# Patient Record
Sex: Female | Born: 1960 | Race: Black or African American | Hispanic: No | Marital: Single | Smoking: Never smoker
Health system: Southern US, Community
[De-identification: ages and names within clinical notes are randomized; demographics above are authoritative.]

## PROBLEM LIST (undated history)

## (undated) DIAGNOSIS — F329 Major depressive disorder, single episode, unspecified: Secondary | ICD-10-CM

## (undated) DIAGNOSIS — E876 Hypokalemia: Secondary | ICD-10-CM

## (undated) DIAGNOSIS — G43909 Migraine, unspecified, not intractable, without status migrainosus: Secondary | ICD-10-CM

## (undated) DIAGNOSIS — F411 Generalized anxiety disorder: Secondary | ICD-10-CM

## (undated) DIAGNOSIS — I10 Essential (primary) hypertension: Secondary | ICD-10-CM

## (undated) DIAGNOSIS — M5126 Other intervertebral disc displacement, lumbar region: Secondary | ICD-10-CM

## (undated) DIAGNOSIS — Z923 Personal history of irradiation: Secondary | ICD-10-CM

## (undated) DIAGNOSIS — M87052 Idiopathic aseptic necrosis of left femur: Secondary | ICD-10-CM

## (undated) DIAGNOSIS — G2581 Restless legs syndrome: Secondary | ICD-10-CM

## (undated) DIAGNOSIS — M654 Radial styloid tenosynovitis [de Quervain]: Secondary | ICD-10-CM

## (undated) DIAGNOSIS — E119 Type 2 diabetes mellitus without complications: Secondary | ICD-10-CM

## (undated) DIAGNOSIS — R7303 Prediabetes: Secondary | ICD-10-CM

## (undated) DIAGNOSIS — M51369 Other intervertebral disc degeneration, lumbar region without mention of lumbar back pain or lower extremity pain: Secondary | ICD-10-CM

## (undated) DIAGNOSIS — M87051 Idiopathic aseptic necrosis of right femur: Secondary | ICD-10-CM

## (undated) DIAGNOSIS — M542 Cervicalgia: Secondary | ICD-10-CM

## (undated) DIAGNOSIS — G4733 Obstructive sleep apnea (adult) (pediatric): Secondary | ICD-10-CM

## (undated) DIAGNOSIS — M1990 Unspecified osteoarthritis, unspecified site: Secondary | ICD-10-CM

## (undated) DIAGNOSIS — Z860100 Personal history of colon polyps, unspecified: Secondary | ICD-10-CM

## (undated) DIAGNOSIS — F419 Anxiety disorder, unspecified: Secondary | ICD-10-CM

## (undated) DIAGNOSIS — K219 Gastro-esophageal reflux disease without esophagitis: Secondary | ICD-10-CM

## (undated) DIAGNOSIS — G4700 Insomnia, unspecified: Secondary | ICD-10-CM

## (undated) DIAGNOSIS — C50911 Malignant neoplasm of unspecified site of right female breast: Secondary | ICD-10-CM

## (undated) DIAGNOSIS — D0511 Intraductal carcinoma in situ of right breast: Secondary | ICD-10-CM

## (undated) DIAGNOSIS — M5136 Other intervertebral disc degeneration, lumbar region: Secondary | ICD-10-CM

## (undated) HISTORY — PX: BREAST BIOPSY: SHX20

## (undated) HISTORY — DX: Personal history of irradiation: Z92.3

## (undated) HISTORY — DX: Idiopathic aseptic necrosis of right femur: M87.052

## (undated) HISTORY — DX: Hypokalemia: E87.6

## (undated) HISTORY — DX: Generalized anxiety disorder: F41.1

## (undated) HISTORY — DX: Insomnia, unspecified: G47.00

## (undated) HISTORY — DX: Essential (primary) hypertension: I10

## (undated) HISTORY — PX: DILATION AND CURETTAGE OF UTERUS: SHX78

## (undated) HISTORY — DX: Major depressive disorder, single episode, unspecified: F32.9

## (undated) HISTORY — DX: Cervicalgia: M54.2

## (undated) HISTORY — DX: Radial styloid tenosynovitis (de quervain): M65.4

## (undated) HISTORY — DX: Personal history of colon polyps, unspecified: Z86.0100

## (undated) HISTORY — DX: Type 2 diabetes mellitus without complications: E11.9

## (undated) HISTORY — PX: JOINT REPLACEMENT: SHX530

## (undated) HISTORY — DX: Obstructive sleep apnea (adult) (pediatric): G47.33

## (undated) HISTORY — DX: Idiopathic aseptic necrosis of right femur: M87.051

## (undated) HISTORY — PX: ABDOMINAL HYSTERECTOMY: SHX81

## (undated) HISTORY — DX: Intraductal carcinoma in situ of right breast: D05.11

## (undated) HISTORY — DX: Personal history of colonic polyps: Z86.010

## (undated) HISTORY — DX: Personal history of colon polyps: Z86.010

## (undated) HISTORY — PX: BACK SURGERY: SHX140

---
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
## 1995-08-09 HISTORY — PX: BREAST LUMPECTOMY: SHX2

## 1998-02-23 ENCOUNTER — Ambulatory Visit (HOSPITAL_COMMUNITY): Admission: RE | Admit: 1998-02-23 | Discharge: 1998-02-23 | Payer: Self-pay | Admitting: Surgery

## 1999-07-30 ENCOUNTER — Ambulatory Visit (HOSPITAL_COMMUNITY): Admission: RE | Admit: 1999-07-30 | Discharge: 1999-07-30 | Payer: Self-pay | Admitting: Internal Medicine

## 1999-07-30 ENCOUNTER — Encounter: Payer: Self-pay | Admitting: Internal Medicine

## 1999-08-31 ENCOUNTER — Encounter: Payer: Self-pay | Admitting: Internal Medicine

## 1999-08-31 ENCOUNTER — Encounter: Admission: RE | Admit: 1999-08-31 | Discharge: 1999-08-31 | Payer: Self-pay | Admitting: Internal Medicine

## 2000-08-31 ENCOUNTER — Encounter: Payer: Self-pay | Admitting: Oncology

## 2000-08-31 ENCOUNTER — Ambulatory Visit (HOSPITAL_COMMUNITY): Admission: RE | Admit: 2000-08-31 | Discharge: 2000-08-31 | Payer: Self-pay | Admitting: Oncology

## 2001-02-14 ENCOUNTER — Other Ambulatory Visit: Admission: RE | Admit: 2001-02-14 | Discharge: 2001-02-14 | Payer: Self-pay | Admitting: Obstetrics and Gynecology

## 2001-11-01 ENCOUNTER — Encounter: Admission: RE | Admit: 2001-11-01 | Discharge: 2001-11-01 | Payer: Self-pay | Admitting: Oncology

## 2001-11-01 ENCOUNTER — Encounter: Payer: Self-pay | Admitting: Oncology

## 2002-12-04 ENCOUNTER — Encounter: Payer: Self-pay | Admitting: Oncology

## 2002-12-04 ENCOUNTER — Encounter: Admission: RE | Admit: 2002-12-04 | Discharge: 2002-12-04 | Payer: Self-pay | Admitting: Oncology

## 2003-01-27 ENCOUNTER — Encounter: Payer: Self-pay | Admitting: Family Medicine

## 2003-01-27 ENCOUNTER — Encounter: Admission: RE | Admit: 2003-01-27 | Discharge: 2003-01-27 | Payer: Self-pay | Admitting: Family Medicine

## 2003-07-28 ENCOUNTER — Other Ambulatory Visit: Admission: RE | Admit: 2003-07-28 | Discharge: 2003-07-28 | Payer: Self-pay | Admitting: *Deleted

## 2004-02-26 ENCOUNTER — Encounter: Admission: RE | Admit: 2004-02-26 | Discharge: 2004-02-26 | Payer: Self-pay | Admitting: Oncology

## 2005-01-12 ENCOUNTER — Other Ambulatory Visit: Admission: RE | Admit: 2005-01-12 | Discharge: 2005-01-12 | Payer: Self-pay | Admitting: Obstetrics and Gynecology

## 2005-02-16 ENCOUNTER — Ambulatory Visit: Payer: Self-pay | Admitting: Internal Medicine

## 2005-03-03 ENCOUNTER — Ambulatory Visit: Payer: Self-pay | Admitting: Oncology

## 2005-03-29 ENCOUNTER — Ambulatory Visit: Payer: Self-pay | Admitting: Internal Medicine

## 2005-04-21 ENCOUNTER — Ambulatory Visit: Payer: Self-pay | Admitting: Oncology

## 2005-05-16 ENCOUNTER — Encounter: Admission: RE | Admit: 2005-05-16 | Discharge: 2005-05-16 | Payer: Self-pay | Admitting: Oncology

## 2005-08-31 ENCOUNTER — Ambulatory Visit: Payer: Self-pay | Admitting: Internal Medicine

## 2005-09-27 ENCOUNTER — Ambulatory Visit: Payer: Self-pay | Admitting: Oncology

## 2005-12-27 ENCOUNTER — Emergency Department (HOSPITAL_COMMUNITY): Admission: EM | Admit: 2005-12-27 | Discharge: 2005-12-27 | Payer: Self-pay | Admitting: *Deleted

## 2006-04-19 ENCOUNTER — Ambulatory Visit: Payer: Self-pay | Admitting: Oncology

## 2006-10-16 ENCOUNTER — Ambulatory Visit: Payer: Self-pay | Admitting: Internal Medicine

## 2006-10-16 LAB — CONVERTED CEMR LAB
BUN: 7 mg/dL (ref 6–23)
Basophils Absolute: 0 10*3/uL (ref 0.0–0.1)
Lymphocytes Relative: 20.5 % (ref 12.0–46.0)
Neutro Abs: 5.3 10*3/uL (ref 1.4–7.7)
Neutrophils Relative %: 69.7 % (ref 43.0–77.0)
Platelets: 213 10*3/uL (ref 150–400)
RDW: 13.4 % (ref 11.5–14.6)
WBC: 7.5 10*3/uL (ref 4.5–10.5)

## 2006-11-06 ENCOUNTER — Encounter: Admission: RE | Admit: 2006-11-06 | Discharge: 2006-11-06 | Payer: Self-pay | Admitting: Obstetrics and Gynecology

## 2006-11-13 ENCOUNTER — Inpatient Hospital Stay (HOSPITAL_COMMUNITY): Admission: RE | Admit: 2006-11-13 | Discharge: 2006-11-15 | Payer: Self-pay | Admitting: Obstetrics and Gynecology

## 2006-11-13 ENCOUNTER — Encounter (INDEPENDENT_AMBULATORY_CARE_PROVIDER_SITE_OTHER): Payer: Self-pay | Admitting: *Deleted

## 2007-06-29 ENCOUNTER — Encounter: Payer: Self-pay | Admitting: Internal Medicine

## 2007-07-20 ENCOUNTER — Encounter: Payer: Self-pay | Admitting: Internal Medicine

## 2007-09-25 ENCOUNTER — Ambulatory Visit: Payer: Self-pay | Admitting: Internal Medicine

## 2007-09-25 DIAGNOSIS — I10 Essential (primary) hypertension: Secondary | ICD-10-CM | POA: Insufficient documentation

## 2007-09-26 ENCOUNTER — Encounter: Payer: Self-pay | Admitting: Internal Medicine

## 2008-01-03 ENCOUNTER — Encounter: Admission: RE | Admit: 2008-01-03 | Discharge: 2008-01-03 | Payer: Self-pay | Admitting: Obstetrics and Gynecology

## 2008-02-14 ENCOUNTER — Emergency Department (HOSPITAL_COMMUNITY): Admission: EM | Admit: 2008-02-14 | Discharge: 2008-02-14 | Payer: Self-pay | Admitting: Internal Medicine

## 2008-02-14 ENCOUNTER — Telehealth (INDEPENDENT_AMBULATORY_CARE_PROVIDER_SITE_OTHER): Payer: Self-pay | Admitting: *Deleted

## 2008-02-15 ENCOUNTER — Ambulatory Visit: Payer: Self-pay | Admitting: Internal Medicine

## 2008-02-15 DIAGNOSIS — D259 Leiomyoma of uterus, unspecified: Secondary | ICD-10-CM | POA: Insufficient documentation

## 2008-02-15 DIAGNOSIS — Z9889 Other specified postprocedural states: Secondary | ICD-10-CM | POA: Insufficient documentation

## 2008-02-15 DIAGNOSIS — G43909 Migraine, unspecified, not intractable, without status migrainosus: Secondary | ICD-10-CM | POA: Insufficient documentation

## 2008-05-01 ENCOUNTER — Ambulatory Visit: Payer: Self-pay | Admitting: Internal Medicine

## 2008-05-14 ENCOUNTER — Telehealth (INDEPENDENT_AMBULATORY_CARE_PROVIDER_SITE_OTHER): Payer: Self-pay | Admitting: *Deleted

## 2008-07-04 ENCOUNTER — Telehealth (INDEPENDENT_AMBULATORY_CARE_PROVIDER_SITE_OTHER): Payer: Self-pay | Admitting: *Deleted

## 2008-07-10 ENCOUNTER — Ambulatory Visit: Payer: Self-pay | Admitting: Internal Medicine

## 2008-07-10 DIAGNOSIS — R519 Headache, unspecified: Secondary | ICD-10-CM | POA: Insufficient documentation

## 2008-07-10 DIAGNOSIS — F329 Major depressive disorder, single episode, unspecified: Secondary | ICD-10-CM | POA: Insufficient documentation

## 2008-07-10 DIAGNOSIS — Z860100 Personal history of colon polyps, unspecified: Secondary | ICD-10-CM | POA: Insufficient documentation

## 2008-07-10 DIAGNOSIS — Z853 Personal history of malignant neoplasm of breast: Secondary | ICD-10-CM | POA: Insufficient documentation

## 2008-07-10 DIAGNOSIS — R51 Headache: Secondary | ICD-10-CM | POA: Insufficient documentation

## 2008-07-14 ENCOUNTER — Encounter (INDEPENDENT_AMBULATORY_CARE_PROVIDER_SITE_OTHER): Payer: Self-pay | Admitting: *Deleted

## 2008-08-08 HISTORY — PX: COLONOSCOPY W/ POLYPECTOMY: SHX1380

## 2009-01-15 ENCOUNTER — Encounter: Admission: RE | Admit: 2009-01-15 | Discharge: 2009-01-15 | Payer: Self-pay | Admitting: Obstetrics and Gynecology

## 2009-02-18 ENCOUNTER — Encounter: Admission: RE | Admit: 2009-02-18 | Discharge: 2009-02-18 | Payer: Self-pay | Admitting: Obstetrics and Gynecology

## 2009-05-01 ENCOUNTER — Encounter: Admission: RE | Admit: 2009-05-01 | Discharge: 2009-05-01 | Payer: Self-pay | Admitting: Obstetrics and Gynecology

## 2009-05-08 ENCOUNTER — Encounter: Admission: RE | Admit: 2009-05-08 | Discharge: 2009-05-08 | Payer: Self-pay | Admitting: Obstetrics and Gynecology

## 2009-07-25 ENCOUNTER — Telehealth: Payer: Self-pay | Admitting: Internal Medicine

## 2009-07-27 ENCOUNTER — Encounter (INDEPENDENT_AMBULATORY_CARE_PROVIDER_SITE_OTHER): Payer: Self-pay | Admitting: *Deleted

## 2009-07-27 ENCOUNTER — Telehealth (INDEPENDENT_AMBULATORY_CARE_PROVIDER_SITE_OTHER): Payer: Self-pay | Admitting: *Deleted

## 2009-09-02 ENCOUNTER — Ambulatory Visit: Payer: Self-pay | Admitting: Internal Medicine

## 2009-09-02 DIAGNOSIS — R198 Other specified symptoms and signs involving the digestive system and abdomen: Secondary | ICD-10-CM | POA: Insufficient documentation

## 2009-09-02 DIAGNOSIS — F411 Generalized anxiety disorder: Secondary | ICD-10-CM | POA: Insufficient documentation

## 2009-10-23 ENCOUNTER — Ambulatory Visit: Payer: Self-pay | Admitting: Internal Medicine

## 2009-10-23 DIAGNOSIS — K219 Gastro-esophageal reflux disease without esophagitis: Secondary | ICD-10-CM | POA: Insufficient documentation

## 2010-04-06 ENCOUNTER — Ambulatory Visit: Payer: Self-pay | Admitting: Internal Medicine

## 2010-04-06 ENCOUNTER — Encounter (INDEPENDENT_AMBULATORY_CARE_PROVIDER_SITE_OTHER): Payer: Self-pay | Admitting: *Deleted

## 2010-07-16 ENCOUNTER — Ambulatory Visit: Payer: Self-pay | Admitting: Internal Medicine

## 2010-07-16 DIAGNOSIS — J069 Acute upper respiratory infection, unspecified: Secondary | ICD-10-CM | POA: Insufficient documentation

## 2010-07-16 HISTORY — DX: Acute upper respiratory infection, unspecified: J06.9

## 2010-07-19 ENCOUNTER — Telehealth: Payer: Self-pay | Admitting: Internal Medicine

## 2010-07-20 ENCOUNTER — Encounter: Payer: Self-pay | Admitting: Internal Medicine

## 2010-09-07 NOTE — Letter (Signed)
Summary: Out of Work  [facility]  [address] [place], [place] [address]   Phone: [phone]  Fax: [phone]    April 06, 2010   Employee:  ANTWAN BRIBIESCA Palazzi    To Whom It May Concern:   For Medical reasons, please excuse the above named employee from work for the following dates:  Start:   April 06, 2010   End:   April 07, 2010  If you need additional information, please feel free to contact our office.         Sincerely,    Army Fossa CMA

## 2010-09-07 NOTE — Assessment & Plan Note (Signed)
Summary: DIZZY/HEADACHE/CBS   Vital Signs:  Patient profile:   50 year old female Weight:      197.50 pounds Pulse rate:   88 / minute Pulse rhythm:   regular BP sitting:   152 / 98  (left arm) Cuff size:   large  Vitals Entered By: Army Fossa CMA (April 06, 2010 2:37 PM) CC: Pt here c/o HA, feels that her BP is elevated.   History of Present Illness: developed a headache yesterday  headache is located at the mid forehead , on and off. He is a slightly different to her regular headaches which are usually in the temples She felt slightly dizzy yesterday and had nausea. Today the pain is less intense, no further nausea.  ROS denies fevers No sinus congestion, runny nose or sore throat This is not the worst headache of her life No slurred speech no neck stiffness patient wonders if the headache is related to her blood pressure but she has not been taking ambulatory  BPs  routinely.  Current Medications (verified): 1)  Benazepril Hcl 40 Mg  Tabs (Benazepril Hcl) .Marland Kitchen.. 1 By Mouth Once Daily, 2)  Hydrochlorothiazide 25 Mg  Tabs (Hydrochlorothiazide) .... 1/2 Qd 3)  Carvedilol 6.25 Mg Tabs (Carvedilol) .Marland Kitchen.. 1 Two Times A Day  Allergies (verified): No Known Drug Allergies  Past History:  Past Medical History: Reviewed history from 07/10/2008 and no changes required. Hypertension Breast cancer, hx of Headache Colonic polyps, hx of, Dr [address]  Past Surgical History: Reviewed history from 09/02/2009 and no changes required. DILATION AND CURETTAGE, HX OF (ICD-V45.89) Lumpectomy, Radiation & Chemotherapy 1997 Colon polypectomy 2010  Social History: Occupation:[employer] single tobacco--nmo ETOH-- no  Physical Exam  General:  alert and well-developed.   Head:  face symmetric, sinuses nontender to palpation Nose:  not congested Lungs:  normal respiratory effort, no intercostal retractions, and no accessory muscle use.   Heart:  normal rate, regular rhythm,  and no murmur.   Extremities:  no edema Neurologic:  external ocular movements are intact, pupils equal and reactive Speech is fluent Motor exam normal Neck is full range of motion DTRs symmetric Psych:  not anxious appearing and not depressed appearing.     Impression & Recommendations:  Problem # 1:  HEADACHE (ICD-784.0) headache for one day, slightly different  from the previous headaches but not the worst of her life. No red flag symptoms, neuro  exam normal Plan: Rest, fluids, Tylenol. Will call if no better, will need a CT Her updated medication list for this problem includes:    Carvedilol 12.5 Mg Tabs (Carvedilol) ..... One by mouth twice a day  Problem # 2:  HYPERTENSION (ICD-401.9) Well control? increase carvedilol does from 6.25 twice a day to 12.5  twice a day increase hydrochlorothiazide Her updated medication list for this problem includes:    Benazepril Hcl 40 Mg Tabs (Benazepril hcl) .Marland Kitchen... 1 by mouth once daily,    Hydrochlorothiazide 25 Mg Tabs (Hydrochlorothiazide) .Marland Kitchen... 1 by mouth daily    Carvedilol 12.5 Mg Tabs (Carvedilol) ..... One by mouth twice a day  BP today: 152/98 Prior BP: 158/96 (10/23/2009)  Prior 10 Yr Risk Heart Disease: Not enough information (09/25/2007)  Labs Reviewed: K+: 3.8 (10/16/2006) Creat: : 0.8 (10/16/2006)     Complete Medication List: 1)  Benazepril Hcl 40 Mg Tabs (Benazepril hcl) .Marland Kitchen.. 1 by mouth once daily, 2)  Hydrochlorothiazide 25 Mg Tabs (Hydrochlorothiazide) .Marland Kitchen.. 1 by mouth daily 3)  Carvedilol 12.5 Mg Tabs (  Carvedilol) .... One by mouth twice a day  Patient Instructions: 1)  rest, fluids, Tylenol 500 mg one or 2 every 6 hours as needed for headache. 2)  Call if the headache is severe or no better in a couple of days 3)  Increase the medication for BP, see medication list 4)  Come back in one month for a check up with Dr. Alwyn Ren Prescriptions: BENAZEPRIL HCL 40 MG  TABS (BENAZEPRIL HCL) 1 by mouth once daily,  #30 x  1   Entered and Authorized by:   Elita Quick E. Paz MD   Signed by:   Nolon Rod. Paz MD on 04/06/2010   Method used:   Print then Give to Patient   RxID:   [id number] CARVEDILOL 12.5 MG TABS (CARVEDILOL) one by mouth twice a day  #60 x 3   Entered and Authorized by:   Nolon Rod. Paz MD   Signed by:   Nolon Rod. Paz MD on 04/06/2010   Method used:   Print then Give to Patient   RxID:   [id number] HYDROCHLOROTHIAZIDE 25 MG  TABS (HYDROCHLOROTHIAZIDE) 1 by mouth daily  #30 x 1   Entered and Authorized by:   Nolon Rod. Paz MD   Signed by:   Nolon Rod. Paz MD on 04/06/2010   Method used:   Print then Give to Patient   RxID:   [id number]

## 2010-09-07 NOTE — Assessment & Plan Note (Signed)
Summary: migrane/kdc   Vital Signs:  Patient profile:   50 year old female Weight:      215.4 pounds Temp:     98.2 degrees F oral Pulse rate:   76 / minute Resp:     16 per minute BP sitting:   158 / 96  (left arm) Cuff size:   large  Vitals Entered By: Shonna Chock (October 23, 2009 11:01 AM) CC: Hypertension Management Comments REVIEWED MED LIST, PATIENT AGREED DOSE AND INSTRUCTION CORRECT    CC:  Hypertension Management.  History of Present Illness: Headaches L temple with radiation to R frontal area; sharp, lasting days.Rx: Excedrin ES with partial benefit.Trigger : ? overtime @ work. PMH of migraines. See BP; BP @ home 160/?. additionally some "choking on saliva" in RLDP X 2 over past 2 months.She awakens "choking".  Hypertension History:      She complains of headache, but denies chest pain, palpitations, dyspnea with exertion, orthopnea, PND, peripheral edema, visual symptoms, neurologic problems, syncope, and side effects from treatment.  She notes no problems with any antihypertensive medication side effects.        Positive major cardiovascular risk factors include hypertension.  Negative major cardiovascular risk factors include female age less than 107 years old and non-tobacco-user status.     Allergies (verified): No Known Drug Allergies  Review of Systems General:  Complains of sweats; denies chills and fever; Weight down 7# with W W. Eyes:  Denies blurring, double vision, and vision loss-both eyes. ENT:  Complains of hoarseness; denies difficulty swallowing. GI:  Complains of gas and indigestion; denies abdominal pain, bloody stools, dark tarry stools, nausea, and vomiting; No food or pill dysphagia. Derm:  Denies lesion(s) and rash. Neuro:  Denies brief paralysis, disturbances in coordination, falling down, numbness, tingling, visual disturbances, and weakness; Some mild gait imbalance.  Physical Exam  General:  well-nourished; alert,appropriate and cooperative  throughout examination Eyes:  No corneal or conjunctival inflammation noted. EOMI. Perrla. Field of Vision grossly normal. No icterus Mouth:  Oral mucosa and oropharynx without lesions or exudates.  Teeth in good repair. No pharyngeal erythema.   Lungs:  Normal respiratory effort, chest expands symmetrically. Lungs are clear to auscultation, no crackles or wheezes. Heart:  normal rate, regular rhythm, no gallop, no rub, no JVD, no HJR, and grade 1 /6 systolic murmur.   Abdomen:  Bowel sounds positive,abdomen soft and non-tender without masses, organomegaly or hernias noted. Neurologic:  alert & oriented X3, cranial nerves II-XII intact, strength normal in all extremities, sensation intact to light touch, gait normal, DTRs symmetrical and normal, finger-to-nose normal, and Romberg negative.   Skin:  Intact without suspicious lesions or rashes Cervical Nodes:  No lymphadenopathy noted Axillary Nodes:  No palpable lymphadenopathy Psych:  memory intact for recent and remote, normally interactive, and good eye contact.     Impression & Recommendations:  Problem # 1:  HEADACHE (ICD-784.0)  The following medications were removed from the medication list:    Metoprolol Tartrate 50 Mg Tabs (Metoprolol tartrate) .Marland Kitchen... 1 tab bid Her updated medication list for this problem includes:    Carvedilol 6.25 Mg Tabs (Carvedilol) .Marland Kitchen... 1 two times a day  Problem # 2:  HYPERTENSION (ICD-401.9)  The following medications were removed from the medication list:    Metoprolol Tartrate 50 Mg Tabs (Metoprolol tartrate) .Marland Kitchen... 1 tab bid Her updated medication list for this problem includes:    Benazepril Hcl 40 Mg Tabs (Benazepril hcl) .Marland Kitchen... 1 by mouth once  daily,    Hydrochlorothiazide 25 Mg Tabs (Hydrochlorothiazide) .Marland Kitchen... 1/2 qd    Carvedilol 6.25 Mg Tabs (Carvedilol) .Marland Kitchen... 1 two times a day  Problem # 3:  GERD (ICD-530.81)  Her updated medication list for this problem includes:     Clidinium-chlordiazepoxide 2.5-5 Mg Caps (Clidinium-chlordiazepoxide) .Marland Kitchen... 1 every 6-8 hrs as needed for bowel changes    Ranitidine Hcl 150 Mg Tabs (Ranitidine hcl) .Marland Kitchen... 1 two times a day pre meals as needed  Complete Medication List: 1)  Benazepril Hcl 40 Mg Tabs (Benazepril hcl) .Marland Kitchen.. 1 by mouth once daily, 2)  Hydrochlorothiazide 25 Mg Tabs (Hydrochlorothiazide) .... 1/2 qd 3)  Neurontin 100 Mg Caps (Gabapentin) .Marland Kitchen.. 1-2 q 8 hrs as needed headache 4)  Vitamin D 23762 Unit Caps (Ergocalciferol) .Marland Kitchen.. 1 by mouth weekly 5)  Citalopram Hydrobromide 20 Mg Tabs (Citalopram hydrobromide) .Marland Kitchen.. 1 once daily 6)  Clidinium-chlordiazepoxide 2.5-5 Mg Caps (Clidinium-chlordiazepoxide) .Marland Kitchen.. 1 every 6-8 hrs as needed for bowel changes 7)  Carvedilol 6.25 Mg Tabs (Carvedilol) .Marland Kitchen.. 1 two times a day 8)  Ranitidine Hcl 150 Mg Tabs (Ranitidine hcl) .Marland Kitchen.. 1 two times a day pre meals as needed  Hypertension Assessment/Plan:      The patient's hypertensive risk group is category A: No risk factors and no target organ damage.  Today's blood pressure is 158/96.    Patient Instructions: 1)  Check your Blood Pressure regularly. If it is above: 140/90 ON AVERAGE  you should  call. 2)  Avoid foods high in acid (tomatoes, citrus juices, spicy foods). Avoid eating within two hours of lying down or before exercising. Do not over eat; try smaller more frequent meals. Elevate head of bed twelve inches when sleeping. Consume LESS THAN 25 grams of "sugar" / day from foods & drinks with High Fructose Corn Syrup as #1, 2 or # 3 on label. 3)  Take 650-1000mg  of Tylenol every 4-6 hours as needed for relief of pain . AVOID taking more than 4000mg   in a 24 hour period (can cause liver damage in higher doses). Prescriptions: RANITIDINE HCL 150 MG TABS (RANITIDINE HCL) 1 two times a day pre meals as needed  #60 x 5   Entered and Authorized by:   Marga Melnick MD   Signed by:   Marga Melnick MD on 10/23/2009   Method used:   Faxed  to ...       [facility]  [address] [phone]* (retail)       [address]       [place], [place]  [address]       Ph: [phone]       Fax: [phone]   RxID:   [id number] NEURONTIN 100 MG  CAPS (GABAPENTIN) 1-2 q 8 hrs as needed headache  #30 x 5   Entered and Authorized by:   Marga Melnick MD   Signed by:   Marga Melnick MD on 10/23/2009   Method used:   Faxed to ...       [facility]  [address] [phone]* (retail)       [address]       [place], [place]  [address]       Ph: [phone]       Fax: [phone]   RxID:   [id number] CARVEDILOL 6.25 MG TABS (CARVEDILOL) 1 two times a day  #60 x 5   Entered and Authorized by:   Marga Melnick MD   Signed by:   Marga Melnick MD on  10/23/2009   Method used:   Faxed to ...       [facility]  [address] [phone]* (retail)       [address]       [place], [place]  [address]       Ph: [phone]       Fax: [phone]   RxID:   [id number]

## 2010-09-07 NOTE — Assessment & Plan Note (Signed)
Summary: FU ON BP/KDC   Vital Signs:  Patient profile:   50 year old female Weight:      220.6 pounds Temp:     98.6 degrees F oral Pulse rate:   80 / minute Resp:     16 per minute BP sitting:   140 / 82  (left arm) Cuff size:   large  Vitals Entered By: Shonna Chock (September 02, 2009 3:01 PM) CC: 1.) Follow-up visit: b/p  2.) Diarrhea x 2-3 weeks off/on , Hypertension Management Comments REVIEWED MED LIST, PATIENT AGREED DOSE AND INSTRUCTION CORRECT    CC:  1.) Follow-up visit: b/p  2.) Diarrhea x 2-3 weeks off/on  and Hypertension Management.  History of Present Illness:  Diarrhea      This is a 50 year old woman who presents with bowel changes intermittently > 3 weeks.  The patient reports 3 stools or less per day only during work week, semiformed/loose stools, voluminous stools,  malodorous stools, fecal urgency, bloating, and gassiness, but denies  rectal bleeding,mucus in stool, greasy stools, fecal soiling, alternating diarrhea/constipation, nocturnal diarrhea, fasting diarrhea, and abrupt onset of symptoms.  Associated symptoms include abdominal cramps.  The patient denies fever, abdominal pain, nausea, vomiting, lightheadedness, increased thirst, weight loss, joint pains, mouth ulcers, and eye redness.  The symptoms are worse with specific foods.  The symptoms are better with avoidance of specific foods, those foods prepared in work [employer]. Problem @ home  with dairy & with spices. Her sister takes anti dirrheal tablets before eating.Colonoscopy 2010 : 2 polyps, repeat in 2015.    Hypertension History:      She denies headache, chest pain, palpitations, dyspnea with exertion, orthopnea, PND, peripheral edema, visual symptoms, neurologic problems, syncope, and side effects from treatment.  She notes no problems with any antihypertensive medication side effects.  BP up due to job stress; ? anxiety attacks.        Positive major cardiovascular risk factors include hypertension.   Negative major cardiovascular risk factors include female age less than 22 years old and non-tobacco-user status.     Allergies (verified): No Known Drug Allergies  Past History:  Past Surgical History: DILATION AND CURETTAGE, HX OF (ICD-V45.89) Lumpectomy, Radiation & Chemotherapy 1997 Colon polypectomy 2010  Review of Systems Eyes:  Denies blurring, double vision, and vision loss-both eyes. CV:  Edema only when off BP meds. Neuro:  Denies numbness and tingling. Psych:  Complains of anxiety, depression, easily angered, easily tearful, and irritability; Not SSRI.  Physical Exam  General:  well-nourished,in no acute distress; alert,appropriate and cooperative throughout examination Eyes:  No corneal or conjunctival inflammation noted.No icterus Mouth:  Oral mucosa and oropharynx without lesions or exudates.  No pharyngeal erythema.  Mucosa moist Neck:  No deformities, masses, or tenderness noted. Lungs:  Normal respiratory effort, chest expands symmetrically. Lungs are clear to auscultation, no crackles or wheezes. Heart:  Normal rate and regular rhythm. S1 and S2 normal without gallop, murmur, click, rub . S4 Abdomen:  Bowel sounds positive,abdomen soft and non-tender without masses, organomegaly or hernias noted. Skin:  Intact without suspicious lesions or rashes. No tenting Cervical Nodes:  No lymphadenopathy noted Axillary Nodes:  No palpable lymphadenopathy Psych:  memory intact for recent and remote, normally interactive, and good eye contact.     Impression & Recommendations:  Problem # 1:  CHANGE IN BOWELS (FTD-322.02) Unknown dietary triggers  Problem # 2:  HYPERTENSION (ICD-401.9)  Her updated medication list for this problem includes:  Benazepril Hcl 40 Mg Tabs (Benazepril hcl) .Marland Kitchen... 1 by mouth once daily,    Metoprolol Tartrate 50 Mg Tabs (Metoprolol tartrate) .Marland Kitchen... 1 tab bid    Hydrochlorothiazide 25 Mg Tabs (Hydrochlorothiazide) .Marland Kitchen... 1/2 qd  Problem # 3:   ANXIETY STATE, UNSPECIFIED (ICD-300.00)  Her updated medication list for this problem includes:    Citalopram Hydrobromide 20 Mg Tabs (Citalopram hydrobromide) .Marland Kitchen... 1 once daily  Complete Medication List: 1)  Benazepril Hcl 40 Mg Tabs (Benazepril hcl) .Marland Kitchen.. 1 by mouth once daily, 2)  Metoprolol Tartrate 50 Mg Tabs (Metoprolol tartrate) .Marland Kitchen.. 1 tab bid 3)  Hydrochlorothiazide 25 Mg Tabs (Hydrochlorothiazide) .... 1/2 qd 4)  Neurontin 100 Mg Caps (Gabapentin) .Marland Kitchen.. 1-2 q 8 hrs as needed headache 5)  Vitamin D 16109 Unit Caps (Ergocalciferol) .Marland Kitchen.. 1 by mouth weekly 6)  Citalopram Hydrobromide 20 Mg Tabs (Citalopram hydrobromide) .Marland Kitchen.. 1 once daily 7)  Clidinium-chlordiazepoxide 2.5-5 Mg Caps (Clidinium-chlordiazepoxide) .Marland Kitchen.. 1 every 6-8 hrs as needed for bowel changes  Hypertension Assessment/Plan:      The patient's hypertensive risk group is category A: No risk factors and no target organ damage.  Today's blood pressure is 140/82.    Patient Instructions: 1)  Check your Blood Pressure regularly. If it is above: 135/85 ON AVERAGE you should make an appointment. Keep a food diary if symptoms occur. LactAid if milk induced Prescriptions: CITALOPRAM HYDROBROMIDE 20 MG TABS (CITALOPRAM HYDROBROMIDE) 1 once daily  #90 x 1   Entered and Authorized by:   Marga Melnick MD   Signed by:   Marga Melnick MD on 09/02/2009   Method used:   Faxed to ...       [facility]  [address] [phone]* (retail)       [address]       [place], [place]  [address]       Ph: [phone]       Fax: [phone]   RxID:   [id number] HYDROCHLOROTHIAZIDE 25 MG  TABS (HYDROCHLOROTHIAZIDE) 1/2 qd  #90 x 0   Entered and Authorized by:   Marga Melnick MD   Signed by:   Marga Melnick MD on 09/02/2009   Method used:   Faxed to ...       [facility]  [address] [phone]* (retail)       [address]       [place], [place]  [address]       Ph: [phone]       Fax: [phone]   RxID:   [id number] METOPROLOL TARTRATE 50 MG   TABS (METOPROLOL TARTRATE) 1 tab bid  #180 x 1   Entered and Authorized by:   Marga Melnick MD   Signed by:   Marga Melnick MD on 09/02/2009   Method used:   Faxed to ...       [facility]  [address] [phone]* (retail)       [address]       [place], [place]  [address]       Ph: [phone]       Fax: [phone]   RxID:   [id number] BENAZEPRIL HCL 40 MG  TABS (BENAZEPRIL HCL) 1 by mouth once daily,  #90 x 1   Entered and Authorized by:   Marga Melnick MD   Signed by:   Marga Melnick MD on 09/02/2009   Method used:   Faxed to ...       [facility]  [address] [phone]* (retail)  [address]       [place], [place]  [address]       Ph: [phone]       Fax: [phone]   RxID:   [id number] CLIDINIUM-CHLORDIAZEPOXIDE 2.5-5 MG CAPS (CLIDINIUM-CHLORDIAZEPOXIDE) 1 every 6-8 hrs as needed for bowel changes  #30 x 3   Entered and Authorized by:   Marga Melnick MD   Signed by:   Marga Melnick MD on 09/02/2009   Method used:   Faxed to ...       [facility]  [address] [phone]* (retail)       [address]       [place], [place]  [address]       Ph: [phone]       Fax: [phone]   RxID:   [id number]

## 2010-09-07 NOTE — Assessment & Plan Note (Signed)
Summary: cold//lch   Vital Signs:  Patient profile:   50 year old female Height:      65.5 inches Weight:      205.13 pounds (93.24 kg) BMI:     33.74 O2 Sat:      97 % on Room air Temp:     98.5 degrees F (36.94 degrees C) oral Pulse rate:   89 / minute Resp:     16 per minute BP sitting:   190 / 110  (left arm) Cuff size:   large  Vitals Entered By: Lucious Groves CMA (July 16, 2010 11:58 AM)  O2 Flow:  Room air CC: Possible URI/sinus infection x2 days./kb, URI symptoms Is Patient Diabetic? No Pain Assessment Patient in pain? no      Comments Patient notes that she has been having sore throat, cough with clear mucous production, head congestion, and HA. She denies fever, SOB, and chest pain.   CC:  Possible URI/sinus infection x2 days./kb and URI symptoms.  History of Present Illness:      This is a 50 year old woman who presents with  RTI  symptoms since 12/7, onset as head congestion.  The patient reports nasal congestion, clear nasal discharge, sore throat, and productive cough with clear sputum, but denies purulent nasal discharge and earache.  Associated  are slight wheezing ( no PMH of asthma) ,frontal headache & bilateral facial pain.  The patient denies the following risk factors for Strep sinusitis: tooth pain and tender adenopathy.  Rx: Coricidin HP.        See BP.  The patient reports fatigue, but denies lightheadedness, urinary frequency, and edema.  The patient denies the following associated symptoms: chest pain, chest pressure, palpitations, and syncope.  Compliance with medications (by patient report) has been near 100%.  Adjunctive measures currently used by the patient include salt restriction.  BP  not checked @ home.  Current Medications (verified): 1)  Benazepril Hcl 40 Mg  Tabs (Benazepril Hcl) .Marland Kitchen.. 1 By Mouth Once Daily, 2)  Hydrochlorothiazide 25 Mg  Tabs (Hydrochlorothiazide) .Marland Kitchen.. 1 By Mouth Daily 3)  Carvedilol 12.5 Mg Tabs (Carvedilol) .... One By  Mouth Twice A Day  Allergies (verified): No Known Drug Allergies  Physical Exam  General:  well-nourished,in no acute distress; alert,appropriate and cooperative throughout examination Ears:  External ear exam shows no significant lesions or deformities.  Otoscopic examination reveals clear canals, tympanic membranes are intact bilaterally without bulging, retraction, inflammation or discharge. Hearing is grossly normal bilaterally. Nose:  External nasal examination shows no deformity or inflammation. Nasal mucosa are  boggy ; L nare almost closed  without lesions or exudates. Hyponasal speech Mouth:  Oral mucosa and oropharynx without lesions or exudates.  Teeth in good repair. Lungs:  Normal respiratory effort, chest expands symmetrically. Lungs are clear to auscultation, no crackles or wheezes. Barking cough Heart:  normal rate, regular rhythm, no gallop, no rub, no JVD, no HJR, and grade 1 /6 systolic murmur.   Abdomen:  Bowel sounds positive,abdomen soft and non-tender without masses, organomegaly or hernias noted.No AAA or bruits Extremities:  No clubbing, cyanosis, edema. Cervical Nodes:  No lymphadenopathy noted Axillary Nodes:  No palpable lymphadenopathy   Impression & Recommendations:  Problem # 1:  BRONCHITIS-ACUTE (ICD-466.0)  Her updated medication list for this problem includes:    Azithromycin 250 Mg Tabs (Azithromycin) .Marland Kitchen... As per pack    Hydromet 5-1.5 Mg/26ml Syrp (Hydrocodone-homatropine) .Marland Kitchen... 1 tsp every 6 hrs as needed for  cough  Problem # 2:  URI (ICD-465.9)  Her updated medication list for this problem includes:    Hydromet 5-1.5 Mg/57ml Syrp (Hydrocodone-homatropine) .Marland Kitchen... 1 tsp every 6 hrs as needed for cough  Problem # 3:  HYPERTENSION, ESSENTIAL NOS (ICD-401.9) UNCONTROLLED; risk (12X normal ) discussed Her updated medication list for this problem includes:    Benazepril Hcl 40 Mg Tabs (Benazepril hcl) .Marland Kitchen... 1 by mouth once daily,    Hydrochlorothiazide  25 Mg Tabs (Hydrochlorothiazide) .Marland Kitchen... 1 by mouth daily  Complete Medication List: 1)  Benazepril Hcl 40 Mg Tabs (Benazepril hcl) .Marland Kitchen.. 1 by mouth once daily, 2)  Hydrochlorothiazide 25 Mg Tabs (Hydrochlorothiazide) .Marland Kitchen.. 1 by mouth daily 3)  Carvedilol 25 Mg  .... One by mouth twice a day 4)  Azithromycin 250 Mg Tabs (Azithromycin) .... As per pack 5)  Hydromet 5-1.5 Mg/40ml Syrp (Hydrocodone-homatropine) .Marland Kitchen.. 1 tsp every 6 hrs as needed for cough  Patient Instructions: 1)  Neti pot once daily  unti sinuses are clear.Avoid decongestants. 2)  Drink as much  NON dairy fluid as you can tolerate for the next few days.Increase Carvedilol 12.5 mg two times a day to TWO pills two times a day until present supply completed. 3)  Check your Blood Pressure regularly. If it is above: 135/85 ON AVERAGE  you should make an appointment. Prescriptions: HYDROMET 5-1.5 MG/5ML SYRP (HYDROCODONE-HOMATROPINE) 1 tsp every 6 hrs as needed for cough  #120cc x 0   Entered and Authorized by:   Marga Melnick MD   Signed by:   Marga Melnick MD on 07/16/2010   Method used:   Print then Give to Patient   RxID:   [id number] AZITHROMYCIN 250 MG TABS (AZITHROMYCIN) as per pack  #1 x 0   Entered and Authorized by:   Marga Melnick MD   Signed by:   Marga Melnick MD on 07/16/2010   Method used:   Print then Give to Patient   RxID:   [id number] CARVEDILOL  25 MG one by mouth twice a day  #180 x 1   Entered and Authorized by:   Marga Melnick MD   Signed by:   Marga Melnick MD on 07/16/2010   Method used:   Print then Give to Patient   RxID:   [id number]    Orders Added: 1)  Est. Patient Level IV [01027]

## 2010-09-09 NOTE — Progress Notes (Signed)
Summary: COUGH MEDICATION  Phone Note Call from Patient Call back at Home Phone [phone]   Caller: Patient Summary of Call: Pt was seen by Dr. Alwyn Ren on Friday and was given a RX for a cough medicine. Pt still is coughing alot and wants to know if there is something else that she can have called in for her that doesn't have codiene in it so that she can take it during the day. She doesnt know if she should continue to take the cough medicine she has or do something else. Initial call taken by: Lavell Islam,  July 19, 2010 8:48 AM  Follow-up for Phone Call        pls advise.........Marland KitchenFelecia Deloach CMA  July 19, 2010 9:05 AM  Per dr hopper TESSALON 200 MG CAPS 1 every 6 hours as needed #15...Marland KitchenMarland KitchenFelecia Deloach CMA  July 19, 2010 12:32 PM  Pt aware rx sent to pharmacy..........Marland KitchenFelecia Deloach CMA  July 19, 2010 12:32 PM     New/Updated Medications: TESSALON 200 MG CAPS (BENZONATATE) Take 1 tab every 6 hours as needed Prescriptions: TESSALON 200 MG CAPS (BENZONATATE) Take 1 tab every 6 hours as needed  #15 x 0   Entered by:   Jeremy Johann CMA   Authorized by:   Marga Melnick MD   Signed by:   Jeremy Johann CMA on 07/19/2010   Method used:   Faxed to ...       [facility]  [address] [phone]* (retail)       [address]       [place], [place]  [address]       Ph: [phone]       Fax: [phone]   RxID:   [id number]

## 2010-09-09 NOTE — Letter (Signed)
Summary: [facility]  [facility]   Imported By: Lanelle Bal 08/13/2010 10:50:17  _____________________________________________________________________  External Attachment:    Type:   Image     Comment:   External Document

## 2010-12-24 NOTE — Assessment & Plan Note (Signed)
Madeline Chandler                        [facility] OFFICE NOTE   Madeline Chandler                      MRN:          [id number]  DATE:10/16/2006                            DOB:          Dec 26, 1960    HISTORY OF PRESENT ILLNESS:  Madeline Chandler was seen emergently on October 16, 2006, for abdominal pain and dysmenorrhea.   The menses began on October 10, 2006 and were two weeks early.  She had  heavy flow and large clots.  Although the flow lessened, she continued  to have intermittent clots and cramping pain.  The pain was gas-like in  the lower abdomen and progressive over the last 24-36 hours.  Tylenol/Alleve did not provide relief.  Flatus did provide some  improvement as did the supine position.  She has had associated nausea  and has been unable to take her blood pressure medications for several  days.  She also has constipation followed by some loose stools.  With  the present illness, she has had some anorexia.   PAST MEDICAL HISTORY:  1. Fibroids found on ultrasound.  2. Breast cancer in 1998.  3  Two pregnancies and two D&C's.   FAMILY HISTORY:  No personal family history of uterine or ovarian  disease other than fibroid disease.  Father had stroke and is on  dialysis.  Mother has hypertension as did maternal grandmother and  brother.   SOCIAL HISTORY:  She does not smoke or drink.   ALLERGIES:  She has no known drug allergies.   MEDICATIONS:  1. She had been on Benazepril 40 mg one half daily.  2. Metoprolol 50 mg b.i.d.   PHYSICAL EXAMINATION:  VITAL SIGNS:  Weight is up approximately 8 pounds  to 201.  Pulse 72, respirations 17, blood pressure 202/110.  There was  no postural change in pressure or pulse.  Blood pressure, supine to  standing, varied from 182-190/115/118.  Fundal exam revealed no  papilledema.  She does have arterial narrowing of the fundal arteries.  CHEST:  Clear.  No increased work of breathing.  HEART:   She exhibits  S4.  She has no carotid bruits.  No periumbilical  bruits.  Pedal pulses are decreased.  She has no edema.  She has no  organomegaly.  She is mildly tender in the suprapubic area.   ASSESSMENT:  The risk associated with blood pressure at this level was  discussed with her; it is essentially 13 times normal.  She will be  asked to resume the Benazepril, a whole pill per day and Metoprolol one  pill twice a day.  BUN, creatinine and potassium will be checked.  On  history, she has no other bleeding dyscrasias.  CBC and differential  will be checked.  She will be placed on Ultram ER one daily.  Gynecologic consult with Dr. Vincente Poli will be obtained as quickly as  possible.   Unfortunately, Madeline Chandler did not see Gynecology last year because she was  between jobs and had no insurance coverage.     Madeline Chandler. Alwyn Ren, MD,FACP,FCCP  Electronically Signed    [facility]/MedQ  DD: 10/16/2006  DT: 10/16/2006  Job #: [id number]

## 2010-12-24 NOTE — Op Note (Signed)
NAME:  Madeline Chandler, CAPPELLI               ACCOUNT NO.:  [id number]   MEDICAL RECORD NO.:  [id number]          PATIENT TYPE:  INP   LOCATION:  9306                          FACILITY:  [facility]   PHYSICIAN:  Michelle L. Grewal, M.D.DATE OF BIRTH:  [birth year]   DATE OF PROCEDURE:  11/13/2006  DATE OF DISCHARGE:                               OPERATIVE REPORT   PREOPERATIVE DIAGNOSES:  1. Fibroids.  2. Menorrhagia.  3. Pelvic pain.   POSTOPERATIVE DIAGNOSES:  1. Fibroids.  2. Menorrhagia.  3. Pelvic pain.   PROCEDURES:  Total abdominal hysterectomy and bilateral salpingo-  oophorectomy.   SURGEON:  Michelle L. Vincente Poli, M.D.   ASSISTANT:  Zelphia Cairo, MD.   ANESTHESIA:  General.   SPECIMENS:  Uterus, cervix, tubes and ovaries.   ESTIMATED BLOOD LOSS:  200 mL.   DRAIN:  Foley.   COMPLICATIONS:  None.   PROCEDURE:  Patient taken to the operating room after informed consent  is obtained.  She was counseled and acknowledged excepted risk of  surgery which includes risk of anesthesia, risk of injury to internal  organs such as ureter, bowel and bladder, risk of infection, risk of  pulmonary embolism and risk of DVT.  After the patient was intubated,  she was prepped and draped in the standard fashion.  A Foley catheter  was inserted and was draining clear urine.  A low transverse incision  was made, carried down to the fascia.  Fascia was scored in the midline  and extended laterally.  The rectus muscles were separated in the  midline.  The peritoneum was entered bluntly.  The peritoneal incision  was then stretched.  The uterus was noted to be myomatous and about 16-  week size.  The ovaries appeared normal.  We then placed some laparotomy  pads in the upper abdomen to push the bowel in the upper abdomen.  We  then elevated the uterus and identified the round ligament on the right  side, suture ligated the round ligament and made a small incision in the  broad ligament on either  side anteriorly and posteriorly.  I created an  avascular window just beneath the IP ligament with careful attention to  avoid the ureter below on the right and placed a curved Heaney clamp  across the IP ligament.  This pedicle was suture ligated and tied with a  free tie of 0 Vicryl suture.  This was done in similar fashion on the  left side.  We then developed our bladder flap without difficulty and  skeletonized the uterine artery on either side and then placed curved  Heaney clamps just beside the cervical internal os to clamp the uterine  artery.  Each pedicle was suture ligated using 0 Vicryl suture.  We then  walked our way down beside the cervix with careful attention to avoid  this and careful attention to stay just beside the cervix.  Each pedicle  was suture ligated using 0 Vicryl suture.  Once we reached the level of  the external os and the bladder was well out of our way, we placed  curved Heaney clamps just beneath the external cervical os of the  cervix.  The specimen was removed, the angle stitches were made using 0  Vicryl suture and the remainder of the cuff was closed using four figure-  of-eight using 0 Vicryl suture.  Warm irrigation was performed.  All  pedicles were inspected.  Hemostasis was very good.  The laparotomy pads  and all instruments removed and abdominal cavity.  Peritoneum was closed  using 0 Vicryl in a running stitch.  The rectus muscles were  reapproximated using the same 0 Vicryl.  The fascia was closed using 0  Vicryl in a running stitch starting at each corner meeting in the  midline.  After irrigation of subcutaneous layer, the skin was closed  with staples.  All sponge, lap and instrument counts were correct x2.  The patient went to recovery room in stable condition.      Michelle L. Vincente Poli, M.D.  Electronically Signed     MLG/MEDQ  D:  11/13/2006  T:  11/13/2006  Job:  [id number]

## 2010-12-24 NOTE — Discharge Summary (Signed)
NAME:  Madeline Chandler, Madeline Chandler               ACCOUNT NO.:  [id number]   MEDICAL RECORD NO.:  [id number]          PATIENT TYPE:  INP   LOCATION:  9306                          FACILITY:  [facility]   PHYSICIAN:  Michelle L. Grewal, M.D.DATE OF BIRTH:  [birth year]   DATE OF ADMISSION:  11/13/2006  DATE OF DISCHARGE:  11/15/2006                               DISCHARGE SUMMARY   PRE-OPERATIVE DIAGNOSES:  Pelvic pain, symptomatic fibroids and history  of breast cancer and dysmenorrhea.   POSTOPERATIVE DIAGNOSES:  Pelvic pain, symptomatic fibroids and history  of breast cancer and dysmenorrhea.   PROCEDURE:  TAH and BSO.   HOSPITAL COURSE:  The patient is a 50 year old G2, P0 who presents for  TAH-BSO.  She has multiple fibroids.  She has had problems with  menorrhagia and severe dysmenorrhea.  Of note, she has a history of  breast cancer in 1998.  The patient underwent a TAH-BSO.  Surgery was  uneventful.  EBO was 200 mL.  She did very well post-op.  On post-op day  #1, her hemoglobin was 9.9.  By post-op day #2, she was ambulating,  voiding, tolerating regular diet and had good pain control with her  ibuprofen and Tylox.  Her vitals remained stable, and she remained  afebrile throughout hospitalization.  She was discharged home on post-op  day #2 in good condition.  She was advised no driving for 2 weeks, no  sex for 6 weeks, and she is advised no heavy lifting for 4-6 weeks.  She  was advised to call if she has any fever greater than 100.5 degrees,  nausea, vomiting, redness or drainage from incision site.  She will come  back to the office on Monday for an incision check and for me to remove  her staples.      Michelle L. Vincente Poli, M.D.  Electronically Signed     MLG/MEDQ  D:  12/26/2006  T:  12/26/2006  Job:  [id number]

## 2011-01-27 ENCOUNTER — Other Ambulatory Visit: Payer: Self-pay | Admitting: Internal Medicine

## 2011-03-31 ENCOUNTER — Telehealth: Payer: Self-pay | Admitting: Internal Medicine

## 2011-03-31 NOTE — Telephone Encounter (Signed)
Left msg on voicemail for pt to return call . 

## 2011-04-05 NOTE — Telephone Encounter (Signed)
Left msg for pt to return call.

## 2011-04-06 ENCOUNTER — Ambulatory Visit: Payer: Self-pay | Admitting: Internal Medicine

## 2011-04-07 ENCOUNTER — Encounter: Payer: Self-pay | Admitting: Internal Medicine

## 2011-04-07 ENCOUNTER — Ambulatory Visit (INDEPENDENT_AMBULATORY_CARE_PROVIDER_SITE_OTHER): Payer: 59 | Admitting: Internal Medicine

## 2011-04-07 VITALS — BP 176/94 | HR 92 | Temp 98.4°F | Resp 16 | Wt 215.4 lb

## 2011-04-07 DIAGNOSIS — R300 Dysuria: Secondary | ICD-10-CM

## 2011-04-07 DIAGNOSIS — F430 Acute stress reaction: Secondary | ICD-10-CM

## 2011-04-07 DIAGNOSIS — R350 Frequency of micturition: Secondary | ICD-10-CM

## 2011-04-07 DIAGNOSIS — I10 Essential (primary) hypertension: Secondary | ICD-10-CM

## 2011-04-07 DIAGNOSIS — Z833 Family history of diabetes mellitus: Secondary | ICD-10-CM

## 2011-04-07 LAB — POCT URINALYSIS DIPSTICK
Bilirubin, UA: NEGATIVE
Blood, UA: NEGATIVE
Ketones, UA: NEGATIVE
Nitrite, UA: NEGATIVE
pH, UA: NEGATIVE

## 2011-04-07 LAB — BASIC METABOLIC PANEL
BUN: 19 mg/dL (ref 6–23)
CO2: 27 mEq/L (ref 19–32)
Creatinine, Ser: 1.1 mg/dL (ref 0.4–1.2)
Glucose, Bld: 96 mg/dL (ref 70–99)

## 2011-04-07 LAB — HEMOGLOBIN A1C: Hgb A1c MFr Bld: 6.3 % (ref 4.6–6.5)

## 2011-04-07 MED ORDER — CARVEDILOL 25 MG PO TABS: 25.0000 mg | ORAL_TABLET | Freq: Two times a day (BID) | ORAL | Status: DC

## 2011-04-07 MED ORDER — BENAZEPRIL HCL 40 MG PO TABS: 40.0000 mg | ORAL_TABLET | Freq: Every day | ORAL | Status: DC

## 2011-04-07 MED ORDER — HYDROCHLOROTHIAZIDE 25 MG PO TABS: 25.0000 mg | ORAL_TABLET | ORAL | Status: DC

## 2011-04-07 MED ORDER — CITALOPRAM HYDROBROMIDE 20 MG PO TABS: 20.0000 mg | ORAL_TABLET | Freq: Every day | ORAL | Status: DC

## 2011-04-07 NOTE — Telephone Encounter (Signed)
Spoke w/ pt was seen in office today.

## 2011-04-07 NOTE — Progress Notes (Signed)
Addended byMarga Melnick F on: 04/07/2011 12:24 PM   Modules accepted: Orders

## 2011-04-07 NOTE — Progress Notes (Signed)
Addended by: Doristine Devoid on: 04/07/2011 12:28 PM   Modules accepted: Orders

## 2011-04-07 NOTE — Progress Notes (Signed)
  Subjective:    Patient ID: Madeline Chandler, female    DOB: 28-Sep-1960, 50 y.o.   MRN: [id number]  HPI #1 Frequency: Onset: few months    Worsening: yes Symptoms Urgency: yes,   Hesitancy: no  Hematuria: no  Flank Pain: no  Fever: no    Nausea/Vomiting: no  Nocturia : 2-3X/ night Red Flags  : (Risk Factors for Complicated UTI) Recent Antibiotic Usage (last 30 days): no  Symptoms lasting more than seven (7) days: yes,   More than 3 UTI's last 12 months: no PMH of  1. DM: no 2. Renal Disease/Calculi: no 3. Urinary Tract Abnormality: no  4. Instrumentation/Trauma: no 5. 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  Eyes: No conjunctival inflammation or scleral icterus is present. Arteriolar narrowing; tigroid fundus  Neck:  No deformities, thyromegaly, masses, or tenderness noted.   Heart:  Normal rate and regular rhythm. S1 and S2 normal without gallop, murmur, click, rub or other extra sounds     Lungs:Chest clear to auscultation; no wheezes, rhonchi,rales ,or rubs present.No increased work of breathing.   Abdomen: bowel sounds normal, soft and non-tender without masses, organomegaly or hernias noted.  No guarding or rebound . No AAA or bruits  All pulses intact without  bruits .No ischemic skin changes.    Skin:Warm & dry.  Intact without suspicious lesions or rashes ; no jaundice or tenting  Lymphatic: No lymphadenopathy is noted about the head, neck, axilla               Assessment & Plan:  #1 frequency of urination  #2 hypertension, uncontrolled. Risks discussed  #3 exogenous stresses  Plan see orders and recommendations.

## 2011-04-07 NOTE — Patient Instructions (Signed)
Your BP goal = AVERAGE < 135/85. Avoid ingestion of  excess salt/sodium.Cook with pepper & other spices . Use the salt substitute "No Salt"(unless your potassium has been elevated) OR the Mrs Dash products to season food @ the table. Avoid foods which taste salty or "vinegary" as their sodium contentet will be high.  

## 2011-05-05 LAB — POCT I-STAT, CHEM 8
BUN: 13
Calcium, Ion: 1.23
Chloride: 106
Hemoglobin: 14.3
Sodium: 141
TCO2: 25

## 2011-08-19 ENCOUNTER — Other Ambulatory Visit: Payer: Self-pay | Admitting: Internal Medicine

## 2011-08-22 ENCOUNTER — Other Ambulatory Visit: Payer: Self-pay

## 2011-08-22 ENCOUNTER — Encounter (HOSPITAL_COMMUNITY): Payer: Self-pay | Admitting: Emergency Medicine

## 2011-08-22 ENCOUNTER — Emergency Department (HOSPITAL_COMMUNITY): Payer: 59

## 2011-08-22 ENCOUNTER — Emergency Department (HOSPITAL_COMMUNITY)
Admission: EM | Admit: 2011-08-22 | Discharge: 2011-08-22 | Disposition: A | Discharge status: Home / Self Care | Payer: 59 | Attending: Emergency Medicine | Admitting: Emergency Medicine

## 2011-08-22 DIAGNOSIS — R21 Rash and other nonspecific skin eruption: Secondary | ICD-10-CM | POA: Insufficient documentation

## 2011-08-22 DIAGNOSIS — R059 Cough, unspecified: Secondary | ICD-10-CM | POA: Insufficient documentation

## 2011-08-22 DIAGNOSIS — Z79899 Other long term (current) drug therapy: Secondary | ICD-10-CM | POA: Insufficient documentation

## 2011-08-22 DIAGNOSIS — R05 Cough: Secondary | ICD-10-CM | POA: Insufficient documentation

## 2011-08-22 DIAGNOSIS — I10 Essential (primary) hypertension: Secondary | ICD-10-CM

## 2011-08-22 DIAGNOSIS — R002 Palpitations: Secondary | ICD-10-CM | POA: Insufficient documentation

## 2011-08-22 DIAGNOSIS — I493 Ventricular premature depolarization: Secondary | ICD-10-CM

## 2011-08-22 DIAGNOSIS — Z853 Personal history of malignant neoplasm of breast: Secondary | ICD-10-CM | POA: Insufficient documentation

## 2011-08-22 DIAGNOSIS — I4949 Other premature depolarization: Secondary | ICD-10-CM | POA: Insufficient documentation

## 2011-08-22 LAB — DIFFERENTIAL
Basophils Absolute: 0 10*3/uL (ref 0.0–0.1)
Basophils Relative: 0 % (ref 0–1)
Eosinophils Absolute: 0.2 10*3/uL (ref 0.0–0.7)
Lymphocytes Relative: 53 % — ABNORMAL HIGH (ref 12–46)

## 2011-08-22 LAB — BASIC METABOLIC PANEL
BUN: 15 mg/dL (ref 6–23)
Potassium: 3.9 mEq/L (ref 3.5–5.1)
Sodium: 137 mEq/L (ref 135–145)

## 2011-08-22 LAB — CBC
Hemoglobin: 12.9 g/dL (ref 12.0–15.0)
WBC: 6 10*3/uL (ref 4.0–10.5)

## 2011-08-22 LAB — POCT I-STAT TROPONIN I: Troponin i, poc: 0 ng/mL (ref 0.00–0.08)

## 2011-08-22 MED ORDER — IOHEXOL 350 MG/ML SOLN
80.0000 mL | Freq: Once | INTRAVENOUS | Status: AC | PRN
  Administered 2011-08-22: 80 mL via INTRAVENOUS

## 2011-08-22 MED ORDER — GI COCKTAIL ~~LOC~~
30.0000 mL | Freq: Once | ORAL | Status: AC
  Administered 2011-08-22: 30 mL via ORAL
  Filled 2011-08-22: qty 30

## 2011-08-22 NOTE — ED Provider Notes (Signed)
Medical screening examination/treatment/procedure(s) were performed by non-physician practitioner and as supervising physician I was immediately available for consultation/collaboration.   Glynn Octave, MD 08/22/11 [phone]

## 2011-08-22 NOTE — ED Notes (Signed)
Vitals retaken

## 2011-08-22 NOTE — ED Notes (Signed)
EDP aware of pts Blood pressures. Will continue to monitor.

## 2011-08-22 NOTE — ED Provider Notes (Signed)
History     CSN: [id number]  Arrival date & time 08/22/11  1635   First MD Initiated Contact with Patient 08/22/11 2042      Chief Complaint  Patient presents with  . Palpitations     Patient is a 51 y.o. female presenting with palpitations. The history is provided by the patient.  Palpitations  This is a new problem. The current episode started 12 to 24 hours ago. The problem has been gradually improving. The problem is associated with an unknown factor. Associated symptoms include irregular heartbeat. Pertinent negatives include no diaphoresis, no chest pain, no chest pressure, no near-syncope, no nausea, no vomiting, no back pain, no lower extremity edema, no cough, no hemoptysis and no shortness of breath.   patient reports feeling of palpitations on and off all day today. States she feels her heart beating irregularly. Denies actual chest pain, shortness of breath, pleuritic pain or fever. States that she's had a mild nonproductive cough without other symptoms. States that the symptoms are temporarily relieved but belching but return. Denies epigastric pain or burning. States she has felt irregular beats in the past but was concerned because these have been happening on and off all day today.  Past Medical History  Diagnosis Date  . Hypertension   . History of breast cancer   . History of colonic polyps     Past Surgical History  Procedure Date  . Dilation and curettage of uterus   . Breast lumpectomy 1997  . Colonoscopy w/ polypectomy 2010    No family history on file.  History  Substance Use Topics  . Smoking status: Never Smoker   . Smokeless tobacco: Not on file  . Alcohol Use: No    OB History    Grav Para Term Preterm Abortions TAB SAB Ect Mult Living                  Review of Systems  Constitutional: Negative.  Negative for diaphoresis.  HENT: Negative.   Eyes: Negative.   Respiratory: Negative.  Negative for cough, hemoptysis and shortness of breath.    Cardiovascular: Positive for palpitations. Negative for chest pain and near-syncope.  Gastrointestinal: Negative.  Negative for nausea and vomiting.  Genitourinary: Negative.   Musculoskeletal: Negative.  Negative for back pain.  Skin: Negative.   Neurological: Negative.   Hematological: Negative.   Psychiatric/Behavioral: Negative.     Allergies  Review of patient's allergies indicates no known allergies.  Home Medications   Current Outpatient Rx  Name Route Sig Dispense Refill  . BENAZEPRIL HCL 40 MG PO TABS Oral Take 40 mg by mouth daily.    Marland Kitchen CARVEDILOL 25 MG PO TABS Oral Take 25 mg by mouth 2 (two) times daily with a meal.    . CITALOPRAM HYDROBROMIDE 20 MG PO TABS Oral Take 20 mg by mouth daily.    Marland Kitchen HYDROCHLOROTHIAZIDE 25 MG PO TABS Oral Take 12.5 mg by mouth daily.       BP 198/130  Pulse 68  Temp(Src) 97.9 F (36.6 C) (Oral)  Resp 14  SpO2 100%  Physical Exam  Constitutional: She is oriented to person, place, and time. She appears well-developed and well-nourished.  HENT:  Head: Normocephalic and atraumatic.  Eyes: Conjunctivae are normal.  Neck: Neck supple.  Cardiovascular: Normal rate and regular rhythm.        PVC's noted on initial EKG, but otherwise heart rate is a regular rate and rhythm without ectopy at this time. Patient  is hypertensive at 208/97 but has known HTN  Pulmonary/Chest: Effort normal and breath sounds normal.  Abdominal: Soft. Bowel sounds are normal.  Musculoskeletal: Normal range of motion.  Neurological: She is alert and oriented to person, place, and time.  Skin: Skin is warm and dry. Rash noted. Rash is papular. No erythema.  Psychiatric: She has a normal mood and affect.    ED Course  Procedures   Date: 08/22/2011  Rate: 78  Rhythm: SR w/ PVC's  QRS Axis: normal  Intervals: normal  ST/T Wave abnormalities: nonspecific ST changes and nonspecific T wave changes  Conduction Disutrbances:none  Narrative Interpretation: No acute  changes   Old EKG Reviewed: unchanged  Patient reports complete relief of symptoms after GI cocktail. Blood pressure has improved. Chest x-ray results show a paratracheal density, CT chest with contrast is recommended to rule out mass. Discussed with patient who is agreeable with plan.  11:17 PM CT chest with contrast shows  IMPRESSION: Torturuous brachiocephalic vasculature accounts for the right paratracheal density by chest x-ray. Negative for adenopathy or acute intrathoracic process.  Postop changes right breast and axilla.  Original Report Authenticated By: Judie Petit. Ruel Favors, M.D. I have discussed these findings with the patient who is reassured.  BP now 163/81,patient reports feels much better Will plan for discharge home and encourage her to follow up with primary primary care provider as soon as can be arranged.    Labs Reviewed  DIFFERENTIAL - Abnormal; Notable for the following:    Neutrophils Relative 33 (*)    Lymphocytes Relative 53 (*)    All other components within normal limits  BASIC METABOLIC PANEL - Abnormal; Notable for the following:    Glucose, Bld 102 (*)    GFR calc non Af Amer 68 (*)    GFR calc Af Amer 79 (*)    All other components within normal limits  CBC  I-STAT TROPONIN I   No results found.   No diagnosis found.    MDM  HPI/PE and clinical findings/course c/w PVC's as evidenced on EKG to explain c/o palpitations Chest CT reveals Torturuous brachiocephalic vasculature accounting for the right paratracheal density by chest x-ray HTN (much improved).      Leanne Chang, NP 08/22/11 2322

## 2011-08-22 NOTE — ED Notes (Signed)
PT. REPORTS PALPITATIONS ONSET THIS MORNING , DENIES CHEST PAIN OR SOB ,  NO NAUSEA OR VOMITING .

## 2011-08-22 NOTE — ED Notes (Addendum)
Pt stated that she is having "flutter feeling" in chest since this AM. No SOB. Pt stated that she normally has this feeling and it goes away. This time the feeling did not go away. Pt has a hx of HTN. Pt stated that restarted HTN meds  Friday. No nausea. No pain. No respiratory distress at this time. S1 ad S2 heard. Will continue to monitor.

## 2011-08-29 ENCOUNTER — Ambulatory Visit: Payer: 59 | Admitting: Internal Medicine

## 2011-10-26 ENCOUNTER — Ambulatory Visit (INDEPENDENT_AMBULATORY_CARE_PROVIDER_SITE_OTHER): Payer: 59 | Admitting: Internal Medicine

## 2011-10-26 VITALS — BP 170/110 | HR 92 | Temp 98.9°F | Ht 66.25 in | Wt 216.0 lb

## 2011-10-26 DIAGNOSIS — J019 Acute sinusitis, unspecified: Secondary | ICD-10-CM

## 2011-10-26 DIAGNOSIS — J209 Acute bronchitis, unspecified: Secondary | ICD-10-CM

## 2011-10-26 MED ORDER — AMOXICILLIN 500 MG PO CAPS: 500.0000 mg | ORAL_CAPSULE | Freq: Three times a day (TID) | ORAL | Status: AC

## 2011-10-26 MED ORDER — HYDROCODONE-HOMATROPINE 5-1.5 MG/5ML PO SYRP: 5.0000 mL | ORAL_SOLUTION | Freq: Four times a day (QID) | ORAL | Status: AC | PRN

## 2011-10-26 NOTE — Patient Instructions (Signed)
Plain Mucinex for thick secretions ;force NON dairy fluids. Use a Neti pot daily as needed for sinus congestion. Nasal cleansing in the shower as discussed. Make sure that all residual soap is removed to prevent irritation.  Please remain out of work until  10/28/2011

## 2011-10-26 NOTE — Progress Notes (Signed)
  Subjective:    Patient ID: Madeline Chandler, female    DOB: 02/12/1961, 51 y.o.   MRN: [id number]  HPI Respiratory tract infection Onset/symptoms:10/21/11 as dry cough Exposures (illness/environmental/extrinsic):ill co workers Progression of symptoms:to sputum production Treatments/response:OTC thinning agent & cough drops w/o benefit Present symptoms: Fever/chills/sweats:chills Frontal headache:yes Facial pain:no Nasal purulence:yes Sore throat:yes Dental pain:no Lymphadenopathy:no Wheezing/shortness of breath:some wheezing Cough/sputum/hemoptysis:rattly but dry Pleuritic pain:no Associated extrinsic/allergic symptoms:itchy eyes/ sneezing:no Past medical history: Seasonal allergies:yes/asthma:no Smoking history:no           Review of Systems     Objective:   Physical Exam General appearance:good health ;well nourished; no acute distress or increased work of breathing is present.  No  lymphadenopathy about the head, neck, or axilla noted.   Eyes: No conjunctival inflammation or lid edema is present.  Ears:  External ear exam shows no significant lesions or deformities.  Otoscopic examination reveals clear canals, tympanic membranes are intact bilaterally without bulging, retraction, inflammation or discharge.  Nose:  External nasal examination shows no deformity or inflammation. Nasal mucosa are pink and moist without lesions or exudates. No septal dislocation or deviation.No obstruction to airflow.   Oral exam: Dental caries present; lips and gums are healthy appearing.There is minimal oropharyngeal erythema or exudate noted.     Heart:  Normal rate and regular rhythm. S1  normal . S2 accentuated without gallop, murmur, click, rub. S 4.   Lungs:Chest clear to auscultation; no wheezes, rhonchi,rales ,or rubs present.No increased work of breathing but harsh rattling cough  Extremities:  No cyanosis, edema, or clubbing  noted    Skin: Warm & slightly damp           Assessment & Plan:  #1 acute bronchitis w/o bronchospam #2 sinusitis Plan: See orders and recommendations

## 2012-01-21 ENCOUNTER — Other Ambulatory Visit: Payer: Self-pay | Admitting: Internal Medicine

## 2012-02-23 ENCOUNTER — Ambulatory Visit (INDEPENDENT_AMBULATORY_CARE_PROVIDER_SITE_OTHER): Payer: 59 | Admitting: Internal Medicine

## 2012-02-23 ENCOUNTER — Encounter: Payer: Self-pay | Admitting: Internal Medicine

## 2012-02-23 VITALS — BP 148/88 | HR 75 | Wt 216.2 lb

## 2012-02-23 DIAGNOSIS — F411 Generalized anxiety disorder: Secondary | ICD-10-CM

## 2012-02-23 DIAGNOSIS — F419 Anxiety disorder, unspecified: Secondary | ICD-10-CM

## 2012-02-23 DIAGNOSIS — R9431 Abnormal electrocardiogram [ECG] [EKG]: Secondary | ICD-10-CM

## 2012-02-23 DIAGNOSIS — I10 Essential (primary) hypertension: Secondary | ICD-10-CM

## 2012-02-23 DIAGNOSIS — R7309 Other abnormal glucose: Secondary | ICD-10-CM

## 2012-02-23 MED ORDER — CARVEDILOL 25 MG PO TABS: 25.0000 mg | ORAL_TABLET | Freq: Two times a day (BID) | ORAL | Status: DC

## 2012-02-23 MED ORDER — BENAZEPRIL HCL 40 MG PO TABS: 40.0000 mg | ORAL_TABLET | Freq: Every day | ORAL | Status: DC

## 2012-02-23 MED ORDER — CITALOPRAM HYDROBROMIDE 20 MG PO TABS: 20.0000 mg | ORAL_TABLET | Freq: Every day | ORAL | Status: DC

## 2012-02-23 MED ORDER — HYDROCHLOROTHIAZIDE 25 MG PO TABS: 25.0000 mg | ORAL_TABLET | Freq: Every day | ORAL | Status: DC

## 2012-02-23 NOTE — Assessment & Plan Note (Signed)
Nutritional recommendations made. Fasting lipids and A1c recommended after 10-12 weeks of nutritional change.

## 2012-02-23 NOTE — Assessment & Plan Note (Addendum)
Blood pressure is not well controlled.  HCTZ will be increased to 25 mg daily. Blood pressure goals discussed

## 2012-02-23 NOTE — Patient Instructions (Addendum)
Preventive Health Care:Exercise  30-45  minutes a day, 3-4 days a week.   Eat a low-fat diet with lots of fruits and vegetables, up to 7-9 servings per day. Consume less than 30 (PREFERABLY ZERO) grams of sugar per day from foods & drinks with High Fructose Corn Syrup (HFCS) sugar as #1,2,3 or # 4 on label.Whole Foods, [employer] & [employer] do not carry products with HFCS. Follow a  low carb nutrition program such as [place] or [employer]  to prevent Diabetes progression . White carbohydrates (potatoes, rice, bread, and pasta) have a high spike of sugar and a high load of sugar. For example a  baked potato has a cup of sugar and a  french fry  2 teaspoons of sugar. Yams, wild  rice, whole grained bread &  wheat pasta have been much lower spike and load of  sugar. Portions should be the size of a deck of cards or your palm.  Please  schedule fasting Labs in 12 weeks : BMET,Lipids, hepatic panel,  TSH, A1c. PLEASE BRING THESE INSTRUCTIONS TO FOLLOW UP  LAB APPOINTMENT.This will guarantee correct labs are drawn, eliminating need for repeat blood sampling ( needle sticks ! ). Diagnoses /Codes: 401.9,790.29 Please try to go on My Chart within the next 24 hours to allow me to release the results directly to you in the future.

## 2012-02-23 NOTE — Progress Notes (Signed)
  Subjective:    Patient ID: Madeline Chandler, female    DOB: 1960/10/22, 51 y.o.   MRN: [id number]  HPI HYPERTENSION: Disease Monitoring: Blood pressure range- up to 160-170/102  Chest pain, palpitations- seen in ER 08/22/11 for "fluttering"; no treatment       Dyspnea- no Medications: Compliance- yes  Lightheadedness,Syncope- no    Edema- intermittently  FASTING HYPERGLYCEMIA, PMH of:  Disease Monitoring: Blood Sugar ranges-no  Polyuria/phagia/dipsia-intermittent polydipsia      Visual problems- occasional blurrng Medications: Compliance- no meds ; no diet         Review of Systems She's been off her citalopram for several months. She is under increased stress at her job with increased anxiety. She denies any significant depression at this time     Objective:   Physical Exam Gen.:  well-nourished; in no acute distress Eyes: Extraocular motion intact; no lid lag ; slight OS proptosis Neck: thyroid normal Heart: Normal rhythm and rate without  gallop, or extra heart sounds.Grade 1/2-1 over 6 systolic murmur  Lungs: Chest clear to auscultation without rales,rales, wheezes  Abdomen: No organomegaly or masses.. Aneurysm or renal artery bruits Neuro:Deep tendon reflexes are equal and within normal limits; no tremor  Skin: Warm and dry without significant lesions or rashes; no onycholysis Psych: Normally communicative and interactive; no abnormal mood or affect clinically.         Assessment & Plan:

## 2012-07-11 ENCOUNTER — Other Ambulatory Visit: Payer: Self-pay | Admitting: Obstetrics and Gynecology

## 2012-07-11 DIAGNOSIS — Z9889 Other specified postprocedural states: Secondary | ICD-10-CM

## 2012-07-11 DIAGNOSIS — Z853 Personal history of malignant neoplasm of breast: Secondary | ICD-10-CM

## 2012-07-18 ENCOUNTER — Ambulatory Visit
Admission: RE | Admit: 2012-07-18 | Discharge: 2012-07-18 | Disposition: A | Discharge status: Home / Self Care | Payer: 59 | Source: Ambulatory Visit | Attending: Obstetrics and Gynecology | Admitting: Obstetrics and Gynecology

## 2012-07-18 ENCOUNTER — Other Ambulatory Visit: Payer: Self-pay | Admitting: Obstetrics and Gynecology

## 2012-07-18 DIAGNOSIS — Z853 Personal history of malignant neoplasm of breast: Secondary | ICD-10-CM

## 2012-07-18 DIAGNOSIS — Z9889 Other specified postprocedural states: Secondary | ICD-10-CM

## 2012-09-24 ENCOUNTER — Other Ambulatory Visit: Payer: Self-pay | Admitting: Internal Medicine

## 2012-09-25 ENCOUNTER — Other Ambulatory Visit: Payer: Self-pay | Admitting: Internal Medicine

## 2012-10-05 ENCOUNTER — Ambulatory Visit (INDEPENDENT_AMBULATORY_CARE_PROVIDER_SITE_OTHER): Payer: 59 | Admitting: Internal Medicine

## 2012-10-05 ENCOUNTER — Encounter: Payer: Self-pay | Admitting: Internal Medicine

## 2012-10-05 VITALS — BP 136/78 | HR 83 | Temp 98.4°F | Resp 12 | Wt 219.0 lb

## 2012-10-05 DIAGNOSIS — R05 Cough: Secondary | ICD-10-CM

## 2012-10-05 DIAGNOSIS — J0100 Acute maxillary sinusitis, unspecified: Secondary | ICD-10-CM

## 2012-10-05 DIAGNOSIS — R059 Cough, unspecified: Secondary | ICD-10-CM

## 2012-10-05 MED ORDER — AMOXICILLIN 500 MG PO CAPS: 500.0000 mg | ORAL_CAPSULE | Freq: Three times a day (TID) | ORAL | Status: DC

## 2012-10-05 MED ORDER — FLUTICASONE PROPIONATE 50 MCG/ACT NA SUSP: 1.0000 | Freq: Two times a day (BID) | NASAL | Status: DC | PRN

## 2012-10-05 MED ORDER — HYDROCODONE-HOMATROPINE 5-1.5 MG/5ML PO SYRP: 5.0000 mL | ORAL_SOLUTION | Freq: Four times a day (QID) | ORAL | Status: DC | PRN

## 2012-10-05 NOTE — Progress Notes (Signed)
  Subjective:    Patient ID: Madeline Chandler, female    DOB: March 16, 1961, 52 y.o.   MRN: [id number]  HPI  The respiratory tract symptoms began 08/31/12 as rhinitis ,head congestion,  & cough with scant clear  sputum.   No exposures reported  to sick family or friends,  allergens, or environmental factors as triggers .  Significant active  associated symptoms include frontal headaches, facial pain, dental pain,green nasal purulence, earache w/o  otic discharge. Cough is associated with wheezing but not dyspnea.   Sweats were present    Myalgias and arthralgias were present in legs. Flu shot not current  Treatment with  NSAIDS & Equate  was partially effective. There is no history of asthma, but probable  seasonal  allergies.  The patient had never smoked                 Review of Systems Symptoms not present include  sore throat. Fever, chills were not present . Itchy/  watery eyes &  sneezing were not noted.     Objective:   Physical Exam General appearance:good health ;well nourished; no acute distress or increased work of breathing is present.   Eyes: No conjunctival inflammation or lid edema is present. EOMI & vision normal with lenses Ears:  External ear exam shows no significant lesions or deformities.  Otoscopic examination reveals clear canals, tympanic membranes are intact bilaterally without bulging, retraction, inflammation or discharge. Nose:  External nasal examination shows no deformity or inflammation. Nasal mucosa are pink and moist without lesions or exudates. No septal dislocation or deviation.No obstruction to airflow.  Oral exam: Dental hygiene is good; lips and gums are healthy appearing.There is no oropharyngeal erythema or exudate noted.  Neck:  No deformities, masses, or tenderness noted.   Supple with full range of motion without pain.  Heart:  Normal rate and regular rhythm. S1 and S2 normal without gallop, click, rub or murmur.  Lungs:Chest  clear to auscultation; no wheezes, rhonchi,rales ,or rubs present.No increased work of breathing.  Decreased BS Extremities:  No cyanosis, edema, or clubbing  noted  No  lymphadenopathy about the head, neck, or axilla noted.  Skin: Warm & dry             Assessment & Plan:  #1 rhinosinusitis with some symptoms of bronchitis  Plan: Nasal hygiene interventions discussed. See prescription medications

## 2012-10-05 NOTE — Patient Instructions (Addendum)

## 2012-10-09 ENCOUNTER — Telehealth: Payer: Self-pay | Admitting: *Deleted

## 2012-10-09 MED ORDER — PREDNISONE 20 MG PO TABS: 20.0000 mg | ORAL_TABLET | Freq: Two times a day (BID) | ORAL | Status: DC

## 2012-10-09 MED ORDER — FLUTICASONE-SALMETEROL 250-50 MCG/DOSE IN AEPB: 1.0000 | INHALATION_SPRAY | Freq: Two times a day (BID) | RESPIRATORY_TRACT | Status: DC

## 2012-10-09 NOTE — Telephone Encounter (Signed)
Hydromet is strongest cough Rx; recommend Prednisone 20 mg bid #6. Pick up Advair 250/50 ; inhalation every 12 hrs . CXray if no better

## 2012-10-09 NOTE — Telephone Encounter (Signed)
Pt left VM that her cough is still no better and rib have begun to hurt due to all the coughing. Pt is requesting a stronger cough med.Please advise

## 2012-10-09 NOTE — Telephone Encounter (Signed)
Discuss with patient, Rx sent, samples placed up front.

## 2012-11-20 ENCOUNTER — Other Ambulatory Visit (HOSPITAL_COMMUNITY): Payer: Self-pay | Admitting: Obstetrics and Gynecology

## 2012-11-20 DIAGNOSIS — R1032 Left lower quadrant pain: Secondary | ICD-10-CM

## 2012-11-21 ENCOUNTER — Ambulatory Visit (HOSPITAL_COMMUNITY): Payer: 59

## 2012-11-29 ENCOUNTER — Encounter (HOSPITAL_COMMUNITY): Payer: Self-pay

## 2012-11-29 ENCOUNTER — Ambulatory Visit (HOSPITAL_COMMUNITY)
Admission: RE | Admit: 2012-11-29 | Discharge: 2012-11-29 | Disposition: A | Discharge status: Home / Self Care | Payer: 59 | Source: Ambulatory Visit | Attending: Obstetrics and Gynecology | Admitting: Obstetrics and Gynecology

## 2012-11-29 DIAGNOSIS — Z853 Personal history of malignant neoplasm of breast: Secondary | ICD-10-CM | POA: Insufficient documentation

## 2012-11-29 DIAGNOSIS — R197 Diarrhea, unspecified: Secondary | ICD-10-CM | POA: Insufficient documentation

## 2012-11-29 DIAGNOSIS — K5730 Diverticulosis of large intestine without perforation or abscess without bleeding: Secondary | ICD-10-CM | POA: Insufficient documentation

## 2012-11-29 DIAGNOSIS — R1032 Left lower quadrant pain: Secondary | ICD-10-CM | POA: Insufficient documentation

## 2012-11-29 MED ORDER — IOHEXOL 300 MG/ML  SOLN
100.0000 mL | Freq: Once | INTRAMUSCULAR | Status: AC | PRN
  Administered 2012-11-29: 100 mL via INTRAVENOUS

## 2012-12-21 ENCOUNTER — Other Ambulatory Visit: Payer: Self-pay | Admitting: Internal Medicine

## 2013-02-05 LAB — HM COLONOSCOPY

## 2013-04-01 ENCOUNTER — Other Ambulatory Visit: Payer: Self-pay | Admitting: *Deleted

## 2013-04-01 MED ORDER — BENAZEPRIL HCL 40 MG PO TABS: ORAL_TABLET | ORAL | Status: DC

## 2013-04-01 NOTE — Telephone Encounter (Signed)
Rx was refilled for Benazepril 40 mg.  Ag cma

## 2013-04-03 ENCOUNTER — Other Ambulatory Visit: Payer: Self-pay | Admitting: General Practice

## 2013-04-03 DIAGNOSIS — I10 Essential (primary) hypertension: Secondary | ICD-10-CM

## 2013-04-03 MED ORDER — CARVEDILOL 25 MG PO TABS: 25.0000 mg | ORAL_TABLET | Freq: Two times a day (BID) | ORAL | Status: DC

## 2013-04-19 ENCOUNTER — Encounter: Payer: Self-pay | Admitting: Family Medicine

## 2013-04-19 ENCOUNTER — Ambulatory Visit (INDEPENDENT_AMBULATORY_CARE_PROVIDER_SITE_OTHER): Payer: 59 | Admitting: Family Medicine

## 2013-04-19 VITALS — BP 128/82 | HR 91 | Temp 98.5°F | Wt 222.0 lb

## 2013-04-19 DIAGNOSIS — J209 Acute bronchitis, unspecified: Secondary | ICD-10-CM

## 2013-04-19 DIAGNOSIS — R059 Cough, unspecified: Secondary | ICD-10-CM

## 2013-04-19 DIAGNOSIS — R05 Cough: Secondary | ICD-10-CM

## 2013-04-19 MED ORDER — PROMETHAZINE-DM 6.25-15 MG/5ML PO SYRP: 5.0000 mL | ORAL_SOLUTION | Freq: Four times a day (QID) | ORAL | Status: DC | PRN

## 2013-04-19 MED ORDER — ALBUTEROL SULFATE (2.5 MG/3ML) 0.083% IN NEBU: 2.5000 mg | INHALATION_SOLUTION | Freq: Once | RESPIRATORY_TRACT | Status: DC

## 2013-04-19 MED ORDER — AZITHROMYCIN 250 MG PO TABS: ORAL_TABLET | ORAL | Status: DC

## 2013-04-19 NOTE — Assessment & Plan Note (Signed)
Pt's air movement and chest tightness improved s/p neb tx.  Start zpack.  Cough meds prn.  Reviewed supportive care and red flags that should prompt return.  Pt expressed understanding and is in agreement w/ plan.

## 2013-04-19 NOTE — Patient Instructions (Addendum)
Start the Zpack for bronchitis Use the cough syrup as needed- will cause drowsiness Drink plenty of fluids Alternate tylenol/ibuprofen for sore throat REST! Hang in there!

## 2013-04-19 NOTE — Progress Notes (Signed)
  Subjective:    Patient ID: Madeline Chandler, female    DOB: 03-Apr-1961, 52 y.o.   MRN: [id number]  HPI URI- sore throat, painful when coughing.  Doesn't hurt to swallow.  Deep, barking cough.  sxs started 2 days ago.  No fevers.  + HA.  No ear pain.  No N/V/D.  No known sick contacts.  No hx of asthma.   Review of Systems For ROS see HPI     Objective:   Physical Exam  Constitutional: She appears well-developed and well-nourished. No distress.  HENT:  Head: Normocephalic and atraumatic.  TMs normal bilaterally Mild nasal congestion Throat w/out erythema, edema, or exudate  Eyes: Conjunctivae and EOM are normal. Pupils are equal, round, and reactive to light.  Neck: Normal range of motion. Neck supple.  Cardiovascular: Normal rate, regular rhythm, normal heart sounds and intact distal pulses.   No murmur heard. Pulmonary/Chest: Effort normal and breath sounds normal. No respiratory distress. She has no wheezes.  + hacking cough Decreased air movement diffusely but no wheezing- improved s/p neb tx  Lymphadenopathy:    She has no cervical adenopathy.          Assessment & Plan:

## 2013-07-24 ENCOUNTER — Other Ambulatory Visit: Payer: Self-pay | Admitting: Internal Medicine

## 2013-07-24 NOTE — Telephone Encounter (Signed)
HCTZ refilled per protocol. OV due. JG//CMA

## 2013-07-27 ENCOUNTER — Other Ambulatory Visit: Payer: Self-pay | Admitting: Internal Medicine

## 2013-07-29 NOTE — Telephone Encounter (Signed)
Rx was filled on 07-24-13.//AB/CMA

## 2013-08-19 ENCOUNTER — Ambulatory Visit (INDEPENDENT_AMBULATORY_CARE_PROVIDER_SITE_OTHER): Payer: 59 | Admitting: Family

## 2013-08-19 VITALS — BP 128/80 | HR 80 | Temp 98.6°F | Resp 16 | Ht 65.5 in | Wt 228.0 lb

## 2013-08-19 DIAGNOSIS — R05 Cough: Secondary | ICD-10-CM

## 2013-08-19 DIAGNOSIS — J209 Acute bronchitis, unspecified: Secondary | ICD-10-CM

## 2013-08-19 DIAGNOSIS — R059 Cough, unspecified: Secondary | ICD-10-CM

## 2013-08-19 MED ORDER — ALBUTEROL SULFATE HFA 108 (90 BASE) MCG/ACT IN AERS: 2.0000 | INHALATION_SPRAY | Freq: Four times a day (QID) | RESPIRATORY_TRACT | Status: DC | PRN

## 2013-08-19 MED ORDER — ALBUTEROL SULFATE (2.5 MG/3ML) 0.083% IN NEBU
2.5000 mg | INHALATION_SOLUTION | Freq: Once | RESPIRATORY_TRACT | Status: AC
  Administered 2013-08-19: 2.5 mg via RESPIRATORY_TRACT

## 2013-08-19 MED ORDER — PREDNISONE 10 MG PO TABS: ORAL_TABLET | ORAL | Status: DC

## 2013-08-19 MED ORDER — PANTOPRAZOLE SODIUM 40 MG PO TBEC: 40.0000 mg | DELAYED_RELEASE_TABLET | Freq: Every day | ORAL | Status: DC

## 2013-08-19 MED ORDER — OMEPRAZOLE 40 MG PO CPDR: 40.0000 mg | DELAYED_RELEASE_CAPSULE | Freq: Every day | ORAL | Status: DC

## 2013-08-19 NOTE — Progress Notes (Signed)
Subjective:    Patient ID: Madeline Chandler, female    DOB: 05/23/1961, 53 y.o.   MRN: [id number]  Cough Associated symptoms include headaches and rhinorrhea. Pertinent negatives include no chest pain, chills, fever or shortness of breath.   Madeline. Chandler is a 53 year old female who presents today with consistent, dry, cough x one year. Patient reports symptoms of cough have worsened today, and has had bleeding from her nasal cavity.  Denies fever, denies body aches, has not had flu vaccine.  Patient reports laughing and talking for a prolonged period of time makes cough worse. Patient denies taking OTC meds.  Patient also reports the cough will cause headaches.   Review of Systems  Constitutional: Negative for fever and chills.  HENT: Positive for congestion, rhinorrhea and sinus pressure.        Reports mild sinus pressure x 3 days.  Respiratory: Positive for cough and chest tightness. Negative for shortness of breath.   Cardiovascular: Negative for chest pain.  Musculoskeletal: Positive for neck stiffness.       Intermittently x 1 year.  Neurological: Positive for light-headedness and headaches.       Patient will get headache and light-headedness after coughing.  Psychiatric/Behavioral: Negative for sleep disturbance.   Past Medical History  Diagnosis Date  . Hypertension   . History of breast cancer   . History of colonic polyps     Dr Collene Mares    History   Social History  . Marital Status: Single    Spouse Name: N/A    Number of Children: N/A  . Years of Education: N/A   Occupational History  . Not on file.   Social History Main Topics  . Smoking status: Never Smoker   . Smokeless tobacco: Not on file  . Alcohol Use: No  . Drug Use: No  . Sexual Activity:    Other Topics Concern  . Not on file   Social History Narrative  . No narrative on file    Past Surgical History  Procedure Laterality Date  . Dilation and curettage of uterus    . Breast lumpectomy  1997    . Colonoscopy w/ polypectomy  2010    Dr Collene Mares    Family History  Problem Relation Age of Onset  . Hypertension Mother   . Diabetes Mother   . Hypertension Sister   . Hypertension Brother     X 3  . Stroke Neg Hx   . Heart disease Neg Hx     No Known Allergies  Current Outpatient Prescriptions on File Prior to Visit  Medication Sig Dispense Refill  . benazepril (LOTENSIN) 40 MG tablet TAKE ONE TABLET BY MOUTH EVERY DAY  90 tablet  1  . carvedilol (COREG) 25 MG tablet Take 1 tablet (25 mg total) by mouth 2 (two) times daily with a meal.  180 tablet  1  . clonazePAM (KLONOPIN) 1 MG tablet Take 1 mg by mouth every 8 (eight) hours as needed for anxiety.      . hydrochlorothiazide (HYDRODIURIL) 25 MG tablet TAKE ONE TABLET BY MOUTH ONCE DAILY  90 tablet  0   No current facility-administered medications on file prior to visit.    BP 128/80  Pulse 80  Temp(Src) 98.6 F (37 C) (Oral)  Resp 16  Ht 5' 5.5" (1.664 m)  Wt 228 lb 0.6 oz (103.438 kg)  BMI 37.36 kg/m2  SpO2 97%       Objective:  Physical Exam  Constitutional: She is oriented to person, place, and time. She appears well-nourished.  HENT:  Right Ear: External ear normal.  Left Ear: External ear normal.  Nose: Nose normal.  Mouth/Throat: Oropharynx is clear and moist.  Neck: Neck supple.  Cardiovascular: Normal rate and regular rhythm.   Pulmonary/Chest: Effort normal and breath sounds normal. No respiratory distress. She has no wheezes. She has no rales.  Lymphadenopathy:    She has no cervical adenopathy.  Neurological: She is alert and oriented to person, place, and time.  Skin: Skin is warm and dry.  Psychiatric: She has a normal mood and affect.          Assessment & Plan:

## 2013-08-19 NOTE — Assessment & Plan Note (Addendum)
Patient cough improved after neb treatment. Cough could be related to GERD or ACE inhibitor. Obtain CXR. Start Prednisone taper, Protonix, and Albuterol inhaler. If cough is not improved with above measures, consider d/c ACE. Follow up with Dr. Linna Darner next week.

## 2013-08-19 NOTE — Patient Instructions (Addendum)
Start prednisone and albuterol- use albuterol every 6 hours for the next few days. Start protonix for reflux. Follow up in 1 week.

## 2013-08-19 NOTE — Progress Notes (Signed)
Pre visit review using our clinic review tool, if applicable. No additional management support is needed unless otherwise documented below in the visit note. 

## 2013-08-21 ENCOUNTER — Other Ambulatory Visit: Payer: Self-pay | Admitting: Internal Medicine

## 2013-08-21 ENCOUNTER — Telehealth: Payer: Self-pay | Admitting: Internal Medicine

## 2013-08-21 NOTE — Telephone Encounter (Signed)
Benazepril refilled per protocol. JG//CMA 

## 2013-08-21 NOTE — Telephone Encounter (Signed)
Melissa gave the patient an rx for Pantoprzaole  She needs a prior auth for this med

## 2013-08-21 NOTE — Telephone Encounter (Signed)
Lm on vm awaiting return call from pt, need to know which pharmacy and insurance company

## 2013-08-23 NOTE — Telephone Encounter (Signed)
I have not seen the form since I forward it to the nurse

## 2013-08-23 NOTE — Telephone Encounter (Signed)
Spoke with pharmacist at [facility]. She states that they are receiving message that rx must go through mail order.  Left detailed message for pt and to call and let us know what mail order company she wants to use.

## 2013-08-26 NOTE — Telephone Encounter (Signed)
Notified pt. She states she will have to call her insurance and opt out of the mail order service. Advised her to contact her local pharmacy once she has done that and ask them to run rx through at that time.

## 2013-08-28 ENCOUNTER — Ambulatory Visit (INDEPENDENT_AMBULATORY_CARE_PROVIDER_SITE_OTHER): Payer: 59 | Admitting: Internal Medicine

## 2013-08-28 ENCOUNTER — Encounter: Payer: Self-pay | Admitting: Internal Medicine

## 2013-08-28 VITALS — BP 157/81 | HR 71 | Temp 98.8°F | Wt 227.8 lb

## 2013-08-28 DIAGNOSIS — R05 Cough: Secondary | ICD-10-CM

## 2013-08-28 DIAGNOSIS — J310 Chronic rhinitis: Secondary | ICD-10-CM

## 2013-08-28 DIAGNOSIS — I10 Essential (primary) hypertension: Secondary | ICD-10-CM

## 2013-08-28 DIAGNOSIS — R059 Cough, unspecified: Secondary | ICD-10-CM

## 2013-08-28 DIAGNOSIS — K219 Gastro-esophageal reflux disease without esophagitis: Secondary | ICD-10-CM

## 2013-08-28 DIAGNOSIS — Z23 Encounter for immunization: Secondary | ICD-10-CM

## 2013-08-28 MED ORDER — LOSARTAN POTASSIUM 100 MG PO TABS: 100.0000 mg | ORAL_TABLET | Freq: Every day | ORAL | Status: DC

## 2013-08-28 NOTE — Progress Notes (Signed)
Pre visit review using our clinic review tool, if applicable. No additional management support is needed unless otherwise documented below in the visit note. 

## 2013-08-28 NOTE — Telephone Encounter (Signed)
Received call from pt that she contacted insurance and we will still need to proceed with prior authorization.

## 2013-08-28 NOTE — Patient Instructions (Signed)
Reflux of gastric acid may be asymptomatic as this may occur mainly during sleep.The triggers for reflux  include stress; the "aspirin family" ; alcohol; peppermint; and caffeine (coffee, tea, cola, and chocolate). The aspirin family would include aspirin and the nonsteroidal agents such as ibuprofen &  Naproxen. Tylenol would not cause reflux. If having symptoms ; food & drink should be avoided for @ least 2 hours before going to bed.  Nexium 20 mg 1 each am 30 minutes pre breakfast. Plain Mucinex (NOT D) for thick secretions ;force NON dairy fluids .   Nasal cleansing in the shower as discussed with lather of mild shampoo.After 10 seconds wash off lather while  exhaling through nostrils. Make sure that all residual soap is removed to prevent irritation.  Fluticasone  Or Nasacort AQ OTC1 spray in each nostril twice a day as needed. Use the "crossover" technique into opposite nostril spraying toward opposite ear @ 45 degree angle, not straight up into nostril.  Use a Neti pot daily only  as needed for significant sinus congestion; going from open side to congested side . Plain Allegra (NOT D )  160 daily , Loratidine 10 mg , OR Zyrtec 10 mg @ bedtime  as needed for itchy eyes & sneezing.

## 2013-08-28 NOTE — Progress Notes (Signed)
   Subjective:    Patient ID: Madeline Chandler, female    DOB: 1960/09/03, 53 y.o.   MRN: [id number]  HPI   Cough for over a year, dry and nonproductive. Patient is on benazapril 40 mg. Specifically denies angioedema, difficulty swallowing. Reports coughing spells often last for several minutes.  Had pain in frontal sinuses with cough  And scant green nasal discharge for 2-3 weeks. Cough is worse with these symptoms. Denies fever, chills, sweats. Reports heartburn approximately 2 times per week, currently untreated.    Review of Systems  No wheezing or SOB;she questions has had some benefit with the meter dose inhaler prescribed previously     Objective:   Physical Exam  General appearance:good health ;well nourished; no acute distress or increased work of breathing is present.  No  lymphadenopathy about the head, neck, or axilla noted.   Eyes: No conjunctival inflammation or lid edema is present.  Ears:  External ear exam shows no significant lesions or deformities.  Otoscopic examination reveals clear canals, tympanic membranes are intact bilaterally without bulging, retraction, inflammation or discharge.  Nose:  External nasal examination shows no deformity or inflammation. Nasal mucosa are pink and moist without lesions or exudates. No septal dislocation or deviation.No obstruction to airflow.   Oral exam: Dental hygiene is good; lips and gums are healthy appearing.There is no oropharyngeal erythema or exudate noted.   Neck:  No deformities,  masses, or tenderness noted.     Heart:  Normal rate and regular rhythm. S1 and S2 normal without gallop, murmur, click, rub or other extra sounds.   Lungs:Chest clear to auscultation; no wheezes, rhonchi,rales ,or rubs present.No increased work of breathing.    Extremities:  No cyanosis, edema, or clubbing  noted    Skin: Warm & dry .       Assessment & Plan:  #1 cough , multifactorial. The most likely etiology the cough is the ACE  inhibitor. Additionally she has some component of reflux. There's also some postnasal drainage component without definite rhinosinusitis. See orders

## 2013-09-06 ENCOUNTER — Telehealth: Payer: Self-pay | Admitting: *Deleted

## 2013-09-06 NOTE — Telephone Encounter (Signed)
Spoke with pharmacy and obtained [facility] # [id number] ID # [id number].  Attempted to request PA form and was on hold for 20 minutes.  Can you call and try to obtain PA form?   Ronny Flurry, CMA at 08/28/2013 7:48 AM    Status: Signed       Received call from pt that she contacted insurance and we will still need to proceed with prior authorization.       Ronny Flurry, CMA at 08/26/2013 11:37 AM     Status: Signed        Notified pt. She states she will have to call her insurance and opt out of the mail order service. Advised her to contact her local pharmacy once she has done that and ask them to run rx through at that time.        Ronny Flurry, CMA at 08/23/2013 3:52 PM     Status: Signed        Spoke with pharmacist at [facility]. She states that they are receiving message that rx must go through mail order. Left detailed message for pt and to call and let us know what mail order company she wants to use.

## 2013-09-10 NOTE — Telephone Encounter (Signed)
Received PA form for Protonix, form forward to nurse

## 2013-09-11 ENCOUNTER — Telehealth: Payer: Self-pay | Admitting: Internal Medicine

## 2013-09-11 NOTE — Telephone Encounter (Signed)
Form completed and forwarded to [facility] at [phone]. Awaiting approval / denial.

## 2013-09-11 NOTE — Telephone Encounter (Signed)
Relevant patient education mailed to patient.  

## 2013-09-18 NOTE — Telephone Encounter (Signed)
OK,  Instead of protonix, she should try otc prilosec 20mg  once daily.

## 2013-09-18 NOTE — Telephone Encounter (Signed)
Received denial from [facility] that pt had not had diagnosis confirmed by appropriate diagnostic criteria and pt has failed treatment with an otc med like prilosec, prevacid or zegerid.  Please advise.

## 2013-09-18 NOTE — Telephone Encounter (Signed)
LMOM with contact name and number [for return call, if needed] RE: Denial for Protonix and further provider instructions to try OTC prilosec 20 mg once daily & informed pt to contact us if any problems with this medication/SLS

## 2013-10-29 ENCOUNTER — Other Ambulatory Visit: Payer: Self-pay

## 2013-10-29 MED ORDER — HYDROCHLOROTHIAZIDE 25 MG PO TABS: ORAL_TABLET | ORAL | Status: DC

## 2013-10-29 NOTE — Telephone Encounter (Signed)
Refill for htcz 25 mg done

## 2013-11-23 ENCOUNTER — Other Ambulatory Visit: Payer: Self-pay | Admitting: Internal Medicine

## 2013-12-06 ENCOUNTER — Ambulatory Visit (INDEPENDENT_AMBULATORY_CARE_PROVIDER_SITE_OTHER): Payer: 59 | Admitting: Physician Assistant

## 2013-12-06 ENCOUNTER — Encounter: Payer: Self-pay | Admitting: Physician Assistant

## 2013-12-06 VITALS — BP 120/72 | HR 76 | Temp 97.9°F | Ht 66.25 in | Wt 233.0 lb

## 2013-12-06 DIAGNOSIS — F3289 Other specified depressive episodes: Secondary | ICD-10-CM

## 2013-12-06 DIAGNOSIS — K219 Gastro-esophageal reflux disease without esophagitis: Secondary | ICD-10-CM

## 2013-12-06 DIAGNOSIS — M7989 Other specified soft tissue disorders: Secondary | ICD-10-CM

## 2013-12-06 DIAGNOSIS — F329 Major depressive disorder, single episode, unspecified: Secondary | ICD-10-CM

## 2013-12-06 DIAGNOSIS — F32A Depression, unspecified: Secondary | ICD-10-CM

## 2013-12-06 LAB — BASIC METABOLIC PANEL
BUN: 23 mg/dL (ref 6–23)
CO2: 30 mEq/L (ref 19–32)
CREATININE: 0.96 mg/dL (ref 0.50–1.10)
Calcium: 9.9 mg/dL (ref 8.4–10.5)
Chloride: 99 mEq/L (ref 96–112)
GLUCOSE: 94 mg/dL (ref 70–99)
Potassium: 3.9 mEq/L (ref 3.5–5.3)
Sodium: 136 mEq/L (ref 135–145)

## 2013-12-06 MED ORDER — PANTOPRAZOLE SODIUM 40 MG PO TBEC: 40.0000 mg | DELAYED_RELEASE_TABLET | Freq: Every day | ORAL | Status: DC

## 2013-12-06 MED ORDER — CITALOPRAM HYDROBROMIDE 40 MG PO TABS: 40.0000 mg | ORAL_TABLET | Freq: Every day | ORAL | Status: DC

## 2013-12-06 MED ORDER — BUPROPION HCL ER (XL) 150 MG PO TB24: 150.0000 mg | ORAL_TABLET | Freq: Two times a day (BID) | ORAL | Status: DC

## 2013-12-06 NOTE — Progress Notes (Signed)
Pre visit review using our clinic review tool, if applicable. No additional management support is needed unless otherwise documented below in the visit note. 

## 2013-12-06 NOTE — Patient Instructions (Signed)
For leg swelling -- Please elevate legs at home.  Wear compression stockings during the day.  Continue exercise.    For acid reflux -- I have sent in a prescription for Protonix for you to take daily.  For depression -- Continue current regimen.  Please obtain labs.  I will call you with your results.  I will see you at your visit next week.

## 2013-12-08 DIAGNOSIS — M7989 Other specified soft tissue disorders: Secondary | ICD-10-CM | POA: Insufficient documentation

## 2013-12-08 NOTE — Progress Notes (Signed)
Patient presents to clinic today c/o mild swelling of her feet, ankles and lower legs bilaterally. Patient states the problem has been present for several months. States she has started having to stand more at her job. Noticed an increase in swelling since this change. Patient denies shortness of breath, PND or orthopnea. Patient denies history of heart failure. Does have family history of venous insufficiency and varicose veins. Patient is currently on losartan and hydrochlorothiazide for her hypertension. BP is stable. Patient denies calf pain or tenderness, calf swelling, history of DVT or PE. Denies recent surgery, prolonged travel or prolonged immobilization.  Patient also requesting medication refills. She will be transferring care from Dr. Linna Darner to our practice. Has a "new patient" appointment scheduled next week. Past Medical History  Diagnosis Date  . Hypertension   . History of breast cancer   . History of colonic polyps     Dr Collene Mares    Current Outpatient Prescriptions on File Prior to Visit  Medication Sig Dispense Refill  . carvedilol (COREG) 25 MG tablet TAKE ONE TABLET BY MOUTH TWICE DAILY WITH MEALS  180 tablet  1  . clonazePAM (KLONOPIN) 1 MG tablet Take 1 mg by mouth every 8 (eight) hours as needed for anxiety.      . hydrochlorothiazide (HYDRODIURIL) 25 MG tablet TAKE ONE TABLET BY MOUTH ONCE DAILY  90 tablet  4  . losartan (COZAAR) 100 MG tablet Take 1 tablet (100 mg total) by mouth daily.  30 tablet  5  . albuterol (PROVENTIL HFA;VENTOLIN HFA) 108 (90 BASE) MCG/ACT inhaler Inhale 2 puffs into the lungs every 6 (six) hours as needed for wheezing or shortness of breath.  1 Inhaler  0   No current facility-administered medications on file prior to visit.    No Known Allergies  Family History  Problem Relation Age of Onset  . Hypertension Mother   . Diabetes Mother   . Hypertension Sister   . Hypertension Brother     X 3  . Stroke Neg Hx   . Heart disease Neg Hx      History   Social History  . Marital Status: Single    Spouse Name: N/A    Number of Children: N/A  . Years of Education: N/A   Social History Main Topics  . Smoking status: Never Smoker   . Smokeless tobacco: None  . Alcohol Use: No  . Drug Use: No  . Sexual Activity:    Other Topics Concern  . None   Social History Narrative  . None   Review of Systems - See HPI.  All other ROS are negative.  BP 120/72  Pulse 76  Temp(Src) 97.9 F (36.6 C) (Oral)  Ht 5' 6.25" (1.683 m)  Wt 233 lb (105.688 kg)  BMI 37.31 kg/m2  SpO2 98%  Physical Exam  Vitals reviewed. Constitutional: She is oriented to person, place, and time and well-developed, well-nourished, and in no distress.  HENT:  Head: Normocephalic and atraumatic.  Eyes: Conjunctivae are normal. Pupils are equal, round, and reactive to light.  Neck: No JVD present.  Cardiovascular: Normal rate, regular rhythm, normal heart sounds and intact distal pulses.   No murmur heard. Pulses:      Popliteal pulses are 2+ on the right side, and 2+ on the left side.       Dorsalis pedis pulses are 2+ on the right side, and 2+ on the left side.       Posterior tibial pulses are  2+ on the right side, and 2+ on the left side.  Trace nonpitting edema of bilateral lower extremities up to the level of lower shin noted on examination. Negative calf tenderness. Negative Homans sign bilaterally. Good capillary refill noted. No venous varicosities noted on visual examination.  Pulmonary/Chest: Effort normal and breath sounds normal. No respiratory distress. She has no wheezes. She has no rales. She exhibits no tenderness.  Lymphadenopathy:    She has no cervical adenopathy.  Neurological: She is alert and oriented to person, place, and time.  Skin: Skin is warm and dry. No rash noted.    Recent Results (from the past 2160 hour(s))  BASIC METABOLIC PANEL     Status: None   Collection Time    12/06/13  3:03 PM      Result Value Ref  Range   Sodium 136  135 - 145 mEq/L   Potassium 3.9  3.5 - 5.3 mEq/L   Chloride 99  96 - 112 mEq/L   CO2 30  19 - 32 mEq/L   Glucose, Bld 94  70 - 99 mg/dL   BUN 23  6 - 23 mg/dL   Creat 0.96  0.50 - 1.10 mg/dL   Calcium 9.9  8.4 - 10.5 mg/dL   Assessment/Plan: Leg swelling Swelling is mild and symmetrical. Physical exam otherwise unremarkable. Will obtain BMP to assess kidney function. Swelling is likely due to mild venous insufficiency and prolonged standing. Advised elevation of legs while resting. Compression stockings to be worn throughout the day. Continue hydrochlorothiazide at current doses. Will followup in one week when patient transfers care to our office.  DEPRESSION Medications refill. Continue as directed.

## 2013-12-08 NOTE — Assessment & Plan Note (Signed)
Medications refill. Continue as directed.

## 2013-12-08 NOTE — Assessment & Plan Note (Signed)
Swelling is mild and symmetrical. Physical exam otherwise unremarkable. Will obtain BMP to assess kidney function. Swelling is likely due to mild venous insufficiency and prolonged standing. Advised elevation of legs while resting. Compression stockings to be worn throughout the day. Continue hydrochlorothiazide at current doses. Will followup in one week when patient transfers care to our office.

## 2013-12-10 ENCOUNTER — Telehealth: Payer: Self-pay | Admitting: Physician Assistant

## 2013-12-10 NOTE — Telephone Encounter (Signed)
PA received for Pantoprazole 40mg , forward to nurse

## 2013-12-11 NOTE — Telephone Encounter (Signed)
Faxed PA Request from pharmacy to PCP, Madeline Limes, MD; as this was only an acute visit & requested refill was given on already previously prescribed Rx by PCP/SLS

## 2013-12-12 ENCOUNTER — Encounter: Payer: Self-pay | Admitting: Physician Assistant

## 2013-12-12 ENCOUNTER — Ambulatory Visit (INDEPENDENT_AMBULATORY_CARE_PROVIDER_SITE_OTHER): Payer: 59 | Admitting: Physician Assistant

## 2013-12-12 VITALS — BP 160/70 | HR 71 | Temp 98.8°F | Ht 64.75 in | Wt 230.1 lb

## 2013-12-12 DIAGNOSIS — K219 Gastro-esophageal reflux disease without esophagitis: Secondary | ICD-10-CM

## 2013-12-12 DIAGNOSIS — F3289 Other specified depressive episodes: Secondary | ICD-10-CM

## 2013-12-12 DIAGNOSIS — Z23 Encounter for immunization: Secondary | ICD-10-CM

## 2013-12-12 DIAGNOSIS — F329 Major depressive disorder, single episode, unspecified: Secondary | ICD-10-CM

## 2013-12-12 DIAGNOSIS — Z0000 Encounter for general adult medical examination without abnormal findings: Secondary | ICD-10-CM | POA: Insufficient documentation

## 2013-12-12 DIAGNOSIS — I10 Essential (primary) hypertension: Secondary | ICD-10-CM

## 2013-12-12 LAB — COMPLETE METABOLIC PANEL WITH GFR
ALK PHOS: 86 U/L (ref 39–117)
ALT: 13 U/L (ref 0–35)
AST: 18 U/L (ref 0–37)
Albumin: 4.2 g/dL (ref 3.5–5.2)
BUN: 17 mg/dL (ref 6–23)
CO2: 29 meq/L (ref 19–32)
CREATININE: 0.92 mg/dL (ref 0.50–1.10)
Calcium: 10.3 mg/dL (ref 8.4–10.5)
Chloride: 99 mEq/L (ref 96–112)
GFR, EST AFRICAN AMERICAN: 82 mL/min
GFR, Est Non African American: 71 mL/min
Glucose, Bld: 81 mg/dL (ref 70–99)
Potassium: 3.7 mEq/L (ref 3.5–5.3)
SODIUM: 136 meq/L (ref 135–145)
TOTAL PROTEIN: 7.4 g/dL (ref 6.0–8.3)
Total Bilirubin: 0.7 mg/dL (ref 0.2–1.2)

## 2013-12-12 LAB — CBC WITH DIFFERENTIAL/PLATELET
BASOS ABS: 0 10*3/uL (ref 0.0–0.1)
BASOS PCT: 0 % (ref 0–1)
EOS ABS: 0.2 10*3/uL (ref 0.0–0.7)
EOS PCT: 4 % (ref 0–5)
HCT: 36.6 % (ref 36.0–46.0)
Hemoglobin: 12.4 g/dL (ref 12.0–15.0)
LYMPHS ABS: 2.9 10*3/uL (ref 0.7–4.0)
Lymphocytes Relative: 54 % — ABNORMAL HIGH (ref 12–46)
MCH: 28.6 pg (ref 26.0–34.0)
MCHC: 33.9 g/dL (ref 30.0–36.0)
MCV: 84.5 fL (ref 78.0–100.0)
Monocytes Absolute: 0.6 10*3/uL (ref 0.1–1.0)
Monocytes Relative: 12 % (ref 3–12)
NEUTROS PCT: 30 % — AB (ref 43–77)
Neutro Abs: 1.6 10*3/uL — ABNORMAL LOW (ref 1.7–7.7)
Platelets: 246 10*3/uL (ref 150–400)
RBC: 4.33 MIL/uL (ref 3.87–5.11)
RDW: 15.1 % (ref 11.5–15.5)
WBC: 5.3 10*3/uL (ref 4.0–10.5)

## 2013-12-12 LAB — LIPID PANEL
CHOL/HDL RATIO: 4.3 ratio
CHOLESTEROL: 199 mg/dL (ref 0–200)
HDL: 46 mg/dL (ref >39)
LDL Cholesterol: 135 mg/dL — ABNORMAL HIGH (ref 0–99)
Triglycerides: 89 mg/dL (ref <150)
VLDL: 18 mg/dL (ref 0–40)

## 2013-12-12 NOTE — Assessment & Plan Note (Signed)
Waiting on approval of prior authorization for Protonix.

## 2013-12-12 NOTE — Progress Notes (Signed)
Patient presents to clinic today to establish care.  Acute Concerns: No acute concerns at today's visit  Chronic Issues: Hypertension -- indoors as previously well controlled. Patient currently on carvedilol, hydrochlorothiazide and losartan. Elevated today. Asymptomatic. Patient states she has not taken her medicine today.   History of Breast Cancer -- Followed by OB/GYN.  Diagnosed in 1997. Patient s/p lumpectomy of right breast.  Peripheral Edema -- Is following conservative measures discussed last visit with some improvement. Needs printed prescription for compression stockings.  Health Maintenance: Dental -- Overdue Vision -- Overdue Immunizations -- Will need Tetanus today. Colonoscopy -- Last in 2014; few polyps -- due in 2019 Mammogram -- Last in December 2013.  Is ordered by her OB/GYN. PAP -- :Last PAP in 2-3 years ago; no hx of abnormal findings.  S/P total hysterectomy. Followed by OB/GYN  Past Medical History  Diagnosis Date  . Hypertension   . History of breast cancer   . History of colonic polyps     Dr Collene Mares    Past Surgical History  Procedure Laterality Date  . Dilation and curettage of uterus    . Breast lumpectomy  1997  . Colonoscopy w/ polypectomy  2010    Dr Collene Mares    Current Outpatient Prescriptions on File Prior to Visit  Medication Sig Dispense Refill  . albuterol (PROVENTIL HFA;VENTOLIN HFA) 108 (90 BASE) MCG/ACT inhaler Inhale 2 puffs into the lungs every 6 (six) hours as needed for wheezing or shortness of breath.  1 Inhaler  0  . buPROPion (WELLBUTRIN XL) 150 MG 24 hr tablet Take 1 tablet (150 mg total) by mouth 2 (two) times daily.  60 tablet  0  . carvedilol (COREG) 25 MG tablet TAKE ONE TABLET BY MOUTH TWICE DAILY WITH MEALS  180 tablet  1  . citalopram (CELEXA) 40 MG tablet Take 1 tablet (40 mg total) by mouth daily.  30 tablet  0  . clonazePAM (KLONOPIN) 1 MG tablet Take 1 mg by mouth every 8 (eight) hours as needed for anxiety.      .  hydrochlorothiazide (HYDRODIURIL) 25 MG tablet TAKE ONE TABLET BY MOUTH ONCE DAILY  90 tablet  4  . losartan (COZAAR) 100 MG tablet Take 1 tablet (100 mg total) by mouth daily.  30 tablet  5  . pantoprazole (PROTONIX) 40 MG tablet Take 1 tablet (40 mg total) by mouth daily.  30 tablet  3   No current facility-administered medications on file prior to visit.    No Known Allergies  Family History  Problem Relation Age of Onset  . Hypertension Mother   . Diabetes Mother   . Hypertension Sister   . Hypertension Brother     X 3  . Stroke Neg Hx   . Heart disease Neg Hx     History   Social History  . Marital Status: Single    Spouse Name: N/A    Number of Children: N/A  . Years of Education: N/A   Occupational History  . Not on file.   Social History Main Topics  . Smoking status: Never Smoker   . Smokeless tobacco: Never Used  . Alcohol Use: No  . Drug Use: No  . Sexual Activity: Not on file   Other Topics Concern  . Not on file   Social History Narrative  . No narrative on file   Review of Systems  Constitutional: Negative for fever and weight loss.  HENT: Negative for ear pain, hearing loss and  tinnitus.   Eyes: Negative for blurred vision, double vision, photophobia and pain.  Respiratory: Negative for cough, shortness of breath and wheezing.   Cardiovascular: Negative for chest pain and palpitations.  Gastrointestinal: Positive for heartburn. Negative for nausea, vomiting, abdominal pain, diarrhea, constipation, blood in stool and melena.  Genitourinary: Negative for dysuria, urgency, frequency, hematuria and flank pain.  Musculoskeletal: Positive for joint pain.  Neurological: Negative for dizziness, seizures, loss of consciousness and headaches.  Psychiatric/Behavioral: Positive for depression. Negative for suicidal ideas, hallucinations and substance abuse. The patient is nervous/anxious. The patient does not have insomnia.    BP 160/70  Pulse 71   Temp(Src) 98.8 F (37.1 C) (Oral)  Ht 5' 4.75" (1.645 m)  Wt 230 lb 1.9 oz (104.382 kg)  BMI 38.57 kg/m2  SpO2 95%  Physical Exam  Vitals reviewed. Constitutional: She is oriented to person, place, and time and well-developed, well-nourished, and in no distress.  HENT:  Head: Normocephalic and atraumatic.  Right Ear: External ear normal.  Left Ear: External ear normal.  Nose: Nose normal.  Mouth/Throat: Oropharynx is clear and moist. No oropharyngeal exudate.  TM within normal limits bilaterally.  Eyes: Conjunctivae are normal. Pupils are equal, round, and reactive to light.  Neck: Neck supple.  Cardiovascular: Normal rate, regular rhythm, normal heart sounds and intact distal pulses.   Pulmonary/Chest: Effort normal and breath sounds normal. No respiratory distress. She has no wheezes. She has no rales. She exhibits no tenderness.  Lymphadenopathy:    She has no cervical adenopathy.  Neurological: She is alert and oriented to person, place, and time.  Skin: Skin is warm and dry. No rash noted.  Psychiatric: Affect normal.    Recent Results (from the past 2160 hour(s))  BASIC METABOLIC PANEL     Status: None   Collection Time    12/06/13  3:03 PM      Result Value Ref Range   Sodium 136  135 - 145 mEq/L   Potassium 3.9  3.5 - 5.3 mEq/L   Chloride 99  96 - 112 mEq/L   CO2 30  19 - 32 mEq/L   Glucose, Bld 94  70 - 99 mg/dL   BUN 23  6 - 23 mg/dL   Creat 0.96  0.50 - 1.10 mg/dL   Calcium 9.9  8.4 - 10.5 mg/dL   Assessment/Plan: Unspecified essential hypertension Continue current medication regimen. DASH diet handout given. Patient instructed on the importance of taking medications as directed.  GERD Waiting on approval of prior authorization for Protonix.    DEPRESSION Continue current regimen.  Visit for preventive health examination Medical history reviewed and updated.  Patient given tetanus at today's visit.  UTD on Colonoscopy.  Patient wishes to defer mammogram  to OB/GYN.  WIll obtain fasting labs.

## 2013-12-12 NOTE — Patient Instructions (Signed)
Please obtain labs. I will call you with the results. Continue blood pressure medications as directed. It is important that you do not miss these medication doses. Please obtain compression stockings. Wear these daily. This will help with the mild swelling in her legs. Also make sure that you stay active.  Followup in 3-4 weeks for leg swelling. Followup sooner if you need anything. It was a pleasure participating in your care today.  Preventive Care for Adults, Female A healthy lifestyle and preventive care can promote health and wellness. Preventive health guidelines for women include the following key practices.  A routine yearly physical is a good way to check with your health care provider about your health and preventive screening. It is a chance to share any concerns and updates on your health and to receive a thorough exam.  Visit your dentist for a routine exam and preventive care every 6 months. Brush your teeth twice a day and floss once a day. Good oral hygiene prevents tooth decay and gum disease.  The frequency of eye exams is based on your age, health, family medical history, use of contact lenses, and other factors. Follow your health care provider's recommendations for frequency of eye exams.  Eat a healthy diet. Foods like vegetables, fruits, whole grains, low-fat dairy products, and lean protein foods contain the nutrients you need without too many calories. Decrease your intake of foods high in solid fats, added sugars, and salt. Eat the right amount of calories for you.Get information about a proper diet from your health care provider, if necessary.  Regular physical exercise is one of the most important things you can do for your health. Most adults should get at least 150 minutes of moderate-intensity exercise (any activity that increases your heart rate and causes you to sweat) each week. In addition, most adults need muscle-strengthening exercises on 2 or more days a  week.  Maintain a healthy weight. The body mass index (BMI) is a screening tool to identify possible weight problems. It provides an estimate of body fat based on height and weight. Your health care provider can find your BMI, and can help you achieve or maintain a healthy weight.For adults 20 years and older:  A BMI below 18.5 is considered underweight.  A BMI of 18.5 to 24.9 is normal.  A BMI of 25 to 29.9 is considered overweight.  A BMI of 30 and above is considered obese.  Maintain normal blood lipids and cholesterol levels by exercising and minimizing your intake of saturated fat. Eat a balanced diet with plenty of fruit and vegetables. Blood tests for lipids and cholesterol should begin at age 19 and be repeated every 5 years. If your lipid or cholesterol levels are high, you are over 50, or you are at high risk for heart disease, you may need your cholesterol levels checked more frequently.Ongoing high lipid and cholesterol levels should be treated with medicines if diet and exercise are not working.  If you smoke, find out from your health care provider how to quit. If you do not use tobacco, do not start.  Lung cancer screening is recommended for adults aged 69 80 years who are at high risk for developing lung cancer because of a history of smoking. A yearly low-dose CT scan of the lungs is recommended for people who have at least a 30-pack-year history of smoking and are a current smoker or have quit within the past 15 years. A pack year of smoking is smoking an  average of 1 pack of cigarettes a day for 1 year (for example: 1 pack a day for 30 years or 2 packs a day for 15 years). Yearly screening should continue until the smoker has stopped smoking for at least 15 years. Yearly screening should be stopped for people who develop a health problem that would prevent them from having lung cancer treatment.  If you are pregnant, do not drink alcohol. If you are breastfeeding, be very  cautious about drinking alcohol. If you are not pregnant and choose to drink alcohol, do not have more than 1 drink per day. One drink is considered to be 12 ounces (355 mL) of beer, 5 ounces (148 mL) of wine, or 1.5 ounces (44 mL) of liquor.  Avoid use of street drugs. Do not share needles with anyone. Ask for help if you need support or instructions about stopping the use of drugs.  High blood pressure causes heart disease and increases the risk of stroke. Your blood pressure should be checked at least every 1 to 2 years. Ongoing high blood pressure should be treated with medicines if weight loss and exercise do not work.  If you are 51 54 years old, ask your health care provider if you should take aspirin to prevent strokes.  Diabetes screening involves taking a blood sample to check your fasting blood sugar level. This should be done once every 3 years, after age 10, if you are within normal weight and without risk factors for diabetes. Testing should be considered at a younger age or be carried out more frequently if you are overweight and have at least 1 risk factor for diabetes.  Breast cancer screening is essential preventive care for women. You should practice "breast self-awareness." This means understanding the normal appearance and feel of your breasts and may include breast self-examination. Any changes detected, no matter how small, should be reported to a health care provider. Women in their 63s and 30s should have a clinical breast exam (CBE) by a health care provider as part of a regular health exam every 1 to 3 years. After age 35, women should have a CBE every year. Starting at age 30, women should consider having a mammogram (breast X-ray test) every year. Women who have a family history of breast cancer should talk to their health care provider about genetic screening. Women at a high risk of breast cancer should talk to their health care providers about having an MRI and a mammogram  every year.  Breast cancer gene (BRCA)-related cancer risk assessment is recommended for women who have family members with BRCA-related cancers. BRCA-related cancers include breast, ovarian, tubal, and peritoneal cancers. Having family members with these cancers may be associated with an increased risk for harmful changes (mutations) in the breast cancer genes BRCA1 and BRCA2. Results of the assessment will determine the need for genetic counseling and BRCA1 and BRCA2 testing.  The Pap test is a screening test for cervical cancer. A Pap test can show cell changes on the cervix that might become cervical cancer if left untreated. A Pap test is a procedure in which cells are obtained and examined from the lower end of the uterus (cervix).  Women should have a Pap test starting at age 41.  Between ages 18 and 13, Pap tests should be repeated every 2 years.  Beginning at age 70, you should have a Pap test every 3 years as long as the past 3 Pap tests have been normal.  Some  women have medical problems that increase the chance of getting cervical cancer. Talk to your health care provider about these problems. It is especially important to talk to your health care provider if a new problem develops soon after your last Pap test. In these cases, your health care provider may recommend more frequent screening and Pap tests.  The above recommendations are the same for women who have or have not gotten the vaccine for human papillomavirus (HPV).  If you had a hysterectomy for a problem that was not cancer or a condition that could lead to cancer, then you no longer need Pap tests. Even if you no longer need a Pap test, a regular exam is a good idea to make sure no other problems are starting.  If you are between ages 72 and 84 years, and you have had normal Pap tests going back 10 years, you no longer need Pap tests. Even if you no longer need a Pap test, a regular exam is a good idea to make sure no other  problems are starting.  If you have had past treatment for cervical cancer or a condition that could lead to cancer, you need Pap tests and screening for cancer for at least 20 years after your treatment.  If Pap tests have been discontinued, risk factors (such as a new sexual partner) need to be reassessed to determine if screening should be resumed.  The HPV test is an additional test that may be used for cervical cancer screening. The HPV test looks for the virus that can cause the cell changes on the cervix. The cells collected during the Pap test can be tested for HPV. The HPV test could be used to screen women aged 90 years and older, and should be used in women of any age who have unclear Pap test results. After the age of 32, women should have HPV testing at the same frequency as a Pap test.  Colorectal cancer can be detected and often prevented. Most routine colorectal cancer screening begins at the age of 68 years and continues through age 67 years. However, your health care provider may recommend screening at an earlier age if you have risk factors for colon cancer. On a yearly basis, your health care provider may provide home test kits to check for hidden blood in the stool. Use of a small camera at the end of a tube, to directly examine the colon (sigmoidoscopy or colonoscopy), can detect the earliest forms of colorectal cancer. Talk to your health care provider about this at age 14, when routine screening begins. Direct exam of the colon should be repeated every 5 10 years through age 51 years, unless early forms of pre-cancerous polyps or small growths are found.  People who are at an increased risk for hepatitis B should be screened for this virus. You are considered at high risk for hepatitis B if:  You were born in a country where hepatitis B occurs often. Talk with your health care provider about which countries are considered high risk.  Your parents were born in a high-risk  country and you have not received a shot to protect against hepatitis B (hepatitis B vaccine).  You have HIV or AIDS.  You use needles to inject street drugs.  You live with, or have sex with, someone who has Hepatitis B.  You get hemodialysis treatment.  You take certain medicines for conditions like cancer, organ transplantation, and autoimmune conditions.  Hepatitis C blood testing is  recommended for all people born from [birth year] through 1965 and any individual with known risks for hepatitis C.  Practice safe sex. Use condoms and avoid high-risk sexual practices to reduce the spread of sexually transmitted infections (STIs). STIs include gonorrhea, chlamydia, syphilis, trichomonas, herpes, HPV, and human immunodeficiency virus (HIV). Herpes, HIV, and HPV are viral illnesses that have no cure. They can result in disability, cancer, and death. Sexually active women aged 68 years and younger should be checked for chlamydia. Older women with new or multiple partners should also be tested for chlamydia. Testing for other STIs is recommended if you are sexually active and at increased risk.  Osteoporosis is a disease in which the bones lose minerals and strength with aging. This can result in serious bone fractures or breaks. The risk of osteoporosis can be identified using a bone density scan. Women ages 37 years and over and women at risk for fractures or osteoporosis should discuss screening with their health care providers. Ask your health care provider whether you should take a calcium supplement or vitamin D to reduce the rate of osteoporosis.  Menopause can be associated with physical symptoms and risks. Hormone replacement therapy is available to decrease symptoms and risks. You should talk to your health care provider about whether hormone replacement therapy is right for you.  Use sunscreen. Apply sunscreen liberally and repeatedly throughout the day. You should seek shade when your shadow is  shorter than you. Protect yourself by wearing long sleeves, pants, a wide-brimmed hat, and sunglasses year round, whenever you are outdoors.  Once a month, do a whole body skin exam, using a mirror to look at the skin on your back. Tell your health care provider of new moles, moles that have irregular borders, moles that are larger than a pencil eraser, or moles that have changed in shape or color.  Stay current with required vaccines (immunizations).  Influenza vaccine. All adults should be immunized every year.  Tetanus, diphtheria, and acellular pertussis (Td, Tdap) vaccine. Pregnant women should receive 1 dose of Tdap vaccine during each pregnancy. The dose should be obtained regardless of the length of time since the last dose. Immunization is preferred during the 27th 36th week of gestation. An adult who has not previously received Tdap or who does not know her vaccine status should receive 1 dose of Tdap. This initial dose should be followed by tetanus and diphtheria toxoids (Td) booster doses every 10 years. Adults with an unknown or incomplete history of completing a 3-dose immunization series with Td-containing vaccines should begin or complete a primary immunization series including a Tdap dose. Adults should receive a Td booster every 10 years.  Varicella vaccine. An adult without evidence of immunity to varicella should receive 2 doses or a second dose if she has previously received 1 dose. Pregnant females who do not have evidence of immunity should receive the first dose after pregnancy. This first dose should be obtained before leaving the health care facility. The second dose should be obtained 4 8 weeks after the first dose.  Human papillomavirus (HPV) vaccine. Females aged 45 26 years who have not received the vaccine previously should obtain the 3-dose series. The vaccine is not recommended for use in pregnant females. However, pregnancy testing is not needed before receiving a dose.  If a female is found to be pregnant after receiving a dose, no treatment is needed. In that case, the remaining doses should be delayed until after the pregnancy. Immunization is recommended  for any person with an immunocompromised condition through the age of 16 years if she did not get any or all doses earlier. During the 3-dose series, the second dose should be obtained 4 8 weeks after the first dose. The third dose should be obtained 24 weeks after the first dose and 16 weeks after the second dose.  Zoster vaccine. One dose is recommended for adults aged 53 years or older unless certain conditions are present.  Measles, mumps, and rubella (MMR) vaccine. Adults born before [birth year] generally are considered immune to measles and mumps. Adults born in [birth year] or later should have 1 or more doses of MMR vaccine unless there is a contraindication to the vaccine or there is laboratory evidence of immunity to each of the three diseases. A routine second dose of MMR vaccine should be obtained at least 28 days after the first dose for students attending postsecondary schools, health care workers, or international travelers. People who received inactivated measles vaccine or an unknown type of measles vaccine during 1963 1967 should receive 2 doses of MMR vaccine. People who received inactivated mumps vaccine or an unknown type of mumps vaccine before 1979 and are at high risk for mumps infection should consider immunization with 2 doses of MMR vaccine. For females of childbearing age, rubella immunity should be determined. If there is no evidence of immunity, females who are not pregnant should be vaccinated. If there is no evidence of immunity, females who are pregnant should delay immunization until after pregnancy. Unvaccinated health care workers born before [birth year] who lack laboratory evidence of measles, mumps, or rubella immunity or laboratory confirmation of disease should consider measles and mumps immunization with 2  doses of MMR vaccine or rubella immunization with 1 dose of MMR vaccine.  Pneumococcal 13-valent conjugate (PCV13) vaccine. When indicated, a person who is uncertain of her immunization history and has no record of immunization should receive the PCV13 vaccine. An adult aged 37 years or older who has certain medical conditions and has not been previously immunized should receive 1 dose of PCV13 vaccine. This PCV13 should be followed with a dose of pneumococcal polysaccharide (PPSV23) vaccine. The PPSV23 vaccine dose should be obtained at least 8 weeks after the dose of PCV13 vaccine. An adult aged 14 years or older who has certain medical conditions and previously received 1 or more doses of PPSV23 vaccine should receive 1 dose of PCV13. The PCV13 vaccine dose should be obtained 1 or more years after the last PPSV23 vaccine dose.  Pneumococcal polysaccharide (PPSV23) vaccine. When PCV13 is also indicated, PCV13 should be obtained first. All adults aged 98 years and older should be immunized. An adult younger than age 10 years who has certain medical conditions should be immunized. Any person who resides in a nursing home or long-term care facility should be immunized. An adult smoker should be immunized. People with an immunocompromised condition and certain other conditions should receive both PCV13 and PPSV23 vaccines. People with human immunodeficiency virus (HIV) infection should be immunized as soon as possible after diagnosis. Immunization during chemotherapy or radiation therapy should be avoided. Routine use of PPSV23 vaccine is not recommended for American Indians, [place] Natives, or people younger than 65 years unless there are medical conditions that require PPSV23 vaccine. When indicated, people who have unknown immunization and have no record of immunization should receive PPSV23 vaccine. One-time revaccination 5 years after the first dose of PPSV23 is recommended for people aged 43 64 years who  have chronic  kidney failure, nephrotic syndrome, asplenia, or immunocompromised conditions. People who received 1 2 doses of PPSV23 before age 26 years should receive another dose of PPSV23 vaccine at age 32 years or later if at least 5 years have passed since the previous dose. Doses of PPSV23 are not needed for people immunized with PPSV23 at or after age 46 years.  Meningococcal vaccine. Adults with asplenia or persistent complement component deficiencies should receive 2 doses of quadrivalent meningococcal conjugate (MenACWY-D) vaccine. The doses should be obtained at least 2 months apart. Microbiologists working with certain meningococcal bacteria, [employer] [place] recruits, people at risk during an outbreak, and people who travel to or live in countries with a high rate of meningitis should be immunized. A first-year college student up through age 91 years who is living in a residence hall should receive a dose if she did not receive a dose on or after her 16th birthday. Adults who have certain high-risk conditions should receive one or more doses of vaccine.  Hepatitis A vaccine. Adults who wish to be protected from this disease, have certain high-risk conditions, work with hepatitis A-infected animals, work in hepatitis A research labs, or travel to or work in countries with a high rate of hepatitis A should be immunized. Adults who were previously unvaccinated and who anticipate close contact with an international adoptee during the first 60 days after arrival in the [place] from a country with a high rate of hepatitis A should be immunized.  Hepatitis B vaccine. Adults who wish to be protected from this disease, have certain high-risk conditions, may be exposed to blood or other infectious body fluids, are household contacts or sex partners of hepatitis B positive people, are clients or workers in certain care facilities, or travel to or work in countries with a high rate of hepatitis B should be  immunized.  Haemophilus influenzae type b (Hib) vaccine. A previously unvaccinated person with asplenia or sickle cell disease or having a scheduled splenectomy should receive 1 dose of Hib vaccine. Regardless of previous immunization, a recipient of a hematopoietic stem cell transplant should receive a 3-dose series 6 12 months after her successful transplant. Hib vaccine is not recommended for adults with HIV infection. Preventive Services / Frequency Ages 33 to 39years  Blood pressure check.** / Every 1 to 2 years.  Lipid and cholesterol check.** / Every 5 years beginning at age 66.  Clinical breast exam.** / Every 3 years for women in their 1s and 2s.  BRCA-related cancer risk assessment.** / For women who have family members with a BRCA-related cancer (breast, ovarian, tubal, or peritoneal cancers).  Pap test.** / Every 2 years from ages 58 through 60. Every 3 years starting at age 3 through age 63 or 38 with a history of 3 consecutive normal Pap tests.  HPV screening.** / Every 3 years from ages 73 through ages 41 to 53 with a history of 3 consecutive normal Pap tests.  Hepatitis C blood test.** / For any individual with known risks for hepatitis C.  Skin self-exam. / Monthly.  Influenza vaccine. / Every year.  Tetanus, diphtheria, and acellular pertussis (Tdap, Td) vaccine.** / Consult your health care provider. Pregnant women should receive 1 dose of Tdap vaccine during each pregnancy. 1 dose of Td every 10 years.  Varicella vaccine.** / Consult your health care provider. Pregnant females who do not have evidence of immunity should receive the first dose after pregnancy.  HPV vaccine. / 3 doses over 6 months,  if 61 and younger. The vaccine is not recommended for use in pregnant females. However, pregnancy testing is not needed before receiving a dose.  Measles, mumps, rubella (MMR) vaccine.** / You need at least 1 dose of MMR if you were born in 1957 or later. You may also  need a 2nd dose. For females of childbearing age, rubella immunity should be determined. If there is no evidence of immunity, females who are not pregnant should be vaccinated. If there is no evidence of immunity, females who are pregnant should delay immunization until after pregnancy.  Pneumococcal 13-valent conjugate (PCV13) vaccine.** / Consult your health care provider.  Pneumococcal polysaccharide (PPSV23) vaccine.** / 1 to 2 doses if you smoke cigarettes or if you have certain conditions.  Meningococcal vaccine.** / 1 dose if you are age 31 to 32 years and a Market researcher living in a residence hall, or have one of several medical conditions, you need to get vaccinated against meningococcal disease. You may also need additional booster doses.  Hepatitis A vaccine.** / Consult your health care provider.  Hepatitis B vaccine.** / Consult your health care provider.  Haemophilus influenzae type b (Hib) vaccine.** / Consult your health care provider. Ages 71 to 64years  Blood pressure check.** / Every 1 to 2 years.  Lipid and cholesterol check.** / Every 5 years beginning at age 48 years.  Lung cancer screening. / Every year if you are aged 25 80 years and have a 30-pack-year history of smoking and currently smoke or have quit within the past 15 years. Yearly screening is stopped once you have quit smoking for at least 15 years or develop a health problem that would prevent you from having lung cancer treatment.  Clinical breast exam.** / Every year after age 29 years.  BRCA-related cancer risk assessment.** / For women who have family members with a BRCA-related cancer (breast, ovarian, tubal, or peritoneal cancers).  Mammogram.** / Every year beginning at age 41 years and continuing for as long as you are in good health. Consult with your health care provider.  Pap test.** / Every 3 years starting at age 64 years through age 63 or 16 years with a history of 3 consecutive  normal Pap tests.  HPV screening.** / Every 3 years from ages 54 years through ages 1 to 91 years with a history of 3 consecutive normal Pap tests.  Fecal occult blood test (FOBT) of stool. / Every year beginning at age 23 years and continuing until age 21 years. You may not need to do this test if you get a colonoscopy every 10 years.  Flexible sigmoidoscopy or colonoscopy.** / Every 5 years for a flexible sigmoidoscopy or every 10 years for a colonoscopy beginning at age 25 years and continuing until age 91 years.  Hepatitis C blood test.** / For all people born from [birth year] through 1966 and any individual with known risks for hepatitis C.  Skin self-exam. / Monthly.  Influenza vaccine. / Every year.  Tetanus, diphtheria, and acellular pertussis (Tdap/Td) vaccine.** / Consult your health care provider. Pregnant women should receive 1 dose of Tdap vaccine during each pregnancy. 1 dose of Td every 10 years.  Varicella vaccine.** / Consult your health care provider. Pregnant females who do not have evidence of immunity should receive the first dose after pregnancy.  Zoster vaccine.** / 1 dose for adults aged 34 years or older.  Measles, mumps, rubella (MMR) vaccine.** / You need at least 1 dose of MMR if  you were born in [birth year] or later. You may also need a 2nd dose. For females of childbearing age, rubella immunity should be determined. If there is no evidence of immunity, females who are not pregnant should be vaccinated. If there is no evidence of immunity, females who are pregnant should delay immunization until after pregnancy.  Pneumococcal 13-valent conjugate (PCV13) vaccine.** / Consult your health care provider.  Pneumococcal polysaccharide (PPSV23) vaccine.** / 1 to 2 doses if you smoke cigarettes or if you have certain conditions.  Meningococcal vaccine.** / Consult your health care provider.  Hepatitis A vaccine.** / Consult your health care provider.  Hepatitis B vaccine.** /  Consult your health care provider.  Haemophilus influenzae type b (Hib) vaccine.** / Consult your health care provider. Ages 28 years and over  Blood pressure check.** / Every 1 to 2 years.  Lipid and cholesterol check.** / Every 5 years beginning at age 68 years.  Lung cancer screening. / Every year if you are aged 44 80 years and have a 30-pack-year history of smoking and currently smoke or have quit within the past 15 years. Yearly screening is stopped once you have quit smoking for at least 15 years or develop a health problem that would prevent you from having lung cancer treatment.  Clinical breast exam.** / Every year after age 36 years.  BRCA-related cancer risk assessment.** / For women who have family members with a BRCA-related cancer (breast, ovarian, tubal, or peritoneal cancers).  Mammogram.** / Every year beginning at age 46 years and continuing for as long as you are in good health. Consult with your health care provider.  Pap test.** / Every 3 years starting at age 38 years through age 38 or 28 years with 3 consecutive normal Pap tests. Testing can be stopped between 65 and 70 years with 3 consecutive normal Pap tests and no abnormal Pap or HPV tests in the past 10 years.  HPV screening.** / Every 3 years from ages 36 years through ages 11 or 63 years with a history of 3 consecutive normal Pap tests. Testing can be stopped between 65 and 70 years with 3 consecutive normal Pap tests and no abnormal Pap or HPV tests in the past 10 years.  Fecal occult blood test (FOBT) of stool. / Every year beginning at age 4 years and continuing until age 79 years. You may not need to do this test if you get a colonoscopy every 10 years.  Flexible sigmoidoscopy or colonoscopy.** / Every 5 years for a flexible sigmoidoscopy or every 10 years for a colonoscopy beginning at age 54 years and continuing until age 58 years.  Hepatitis C blood test.** / For all people born from [birth year] through 1966  and any individual with known risks for hepatitis C.  Osteoporosis screening.** / A one-time screening for women ages 33 years and over and women at risk for fractures or osteoporosis.  Skin self-exam. / Monthly.  Influenza vaccine. / Every year.  Tetanus, diphtheria, and acellular pertussis (Tdap/Td) vaccine.** / 1 dose of Td every 10 years.  Varicella vaccine.** / Consult your health care provider.  Zoster vaccine.** / 1 dose for adults aged 65 years or older.  Pneumococcal 13-valent conjugate (PCV13) vaccine.** / Consult your health care provider.  Pneumococcal polysaccharide (PPSV23) vaccine.** / 1 dose for all adults aged 49 years and older.  Meningococcal vaccine.** / Consult your health care provider.  Hepatitis A vaccine.** / Consult your health care provider.  Hepatitis B vaccine.** /  Consult your health care provider.  Haemophilus influenzae type b (Hib) vaccine.** / Consult your health care provider. ** Family history and personal history of risk and conditions may change your health care provider's recommendations. Document Released: 09/20/2001 Document Revised: 05/15/2013 Document Reviewed: 12/20/2010 [facility] Patient Information 2014 [place], [place].

## 2013-12-12 NOTE — Assessment & Plan Note (Signed)
Medical history reviewed and updated.  Patient given tetanus at today's visit.  UTD on Colonoscopy.  Patient wishes to defer mammogram to OB/GYN.  WIll obtain fasting labs.

## 2013-12-12 NOTE — Progress Notes (Signed)
Pre visit review using our clinic review tool, if applicable. No additional management support is needed unless otherwise documented below in the visit note. 

## 2013-12-12 NOTE — Assessment & Plan Note (Signed)
Continue current regimen

## 2013-12-12 NOTE — Assessment & Plan Note (Signed)
Continue current medication regimen. DASH diet handout given. Patient instructed on the importance of taking medications as directed.

## 2013-12-13 LAB — URINALYSIS, ROUTINE W REFLEX MICROSCOPIC
Bilirubin Urine: NEGATIVE
GLUCOSE, UA: NEGATIVE mg/dL
Hgb urine dipstick: NEGATIVE
Ketones, ur: NEGATIVE mg/dL
LEUKOCYTES UA: NEGATIVE
NITRITE: NEGATIVE
PH: 5.5 (ref 5.0–8.0)
Protein, ur: NEGATIVE mg/dL
Specific Gravity, Urine: 1.008 (ref 1.005–1.030)
Urobilinogen, UA: 0.2 mg/dL (ref 0.0–1.0)

## 2013-12-13 LAB — TSH: TSH: 0.853 u[IU]/mL (ref 0.350–4.500)

## 2013-12-13 LAB — HEMOGLOBIN A1C
Hgb A1c MFr Bld: 6.3 % — ABNORMAL HIGH (ref <5.7)
MEAN PLASMA GLUCOSE: 134 mg/dL — AB (ref <117)

## 2013-12-13 NOTE — Telephone Encounter (Signed)
Patient officially changed providers, new PA for Pantoprazole completed & Approval received, faxed to pharmacy 05.08.15/SLS

## 2014-01-06 ENCOUNTER — Other Ambulatory Visit: Payer: Self-pay | Admitting: Physician Assistant

## 2014-01-07 NOTE — Telephone Encounter (Signed)
Refill request for Wellbutrin XL Last filled by MD on - 12/06/2013 #60 x0  Refill request for citalopram Last filled by MD on- 12/06/2013 #30 x0  Last Appt: 12/12/2013 Next Appt: none Please advise refill?

## 2014-03-10 ENCOUNTER — Other Ambulatory Visit: Payer: Self-pay | Admitting: Obstetrics and Gynecology

## 2014-03-11 LAB — CYTOLOGY - PAP

## 2014-03-25 ENCOUNTER — Telehealth: Payer: Self-pay | Admitting: Physician Assistant

## 2014-03-25 DIAGNOSIS — R05 Cough: Secondary | ICD-10-CM

## 2014-03-25 DIAGNOSIS — R059 Cough, unspecified: Secondary | ICD-10-CM

## 2014-03-25 MED ORDER — LOSARTAN POTASSIUM 100 MG PO TABS: 100.0000 mg | ORAL_TABLET | Freq: Every day | ORAL | Status: DC

## 2014-03-25 NOTE — Telephone Encounter (Signed)
Rx sent to [facility] for 30 day supply x 3 rfs.

## 2014-03-25 NOTE — Telephone Encounter (Signed)
Patient is requesting refill of losartan to be sent to [facility] [address]. She states that she is out of this med

## 2014-04-09 ENCOUNTER — Encounter: Payer: Self-pay | Admitting: Physician Assistant

## 2014-04-09 ENCOUNTER — Ambulatory Visit (INDEPENDENT_AMBULATORY_CARE_PROVIDER_SITE_OTHER): Payer: 59 | Admitting: Physician Assistant

## 2014-04-09 VITALS — BP 145/70 | HR 79 | Temp 98.2°F | Wt 237.0 lb

## 2014-04-09 DIAGNOSIS — B9689 Other specified bacterial agents as the cause of diseases classified elsewhere: Secondary | ICD-10-CM | POA: Insufficient documentation

## 2014-04-09 DIAGNOSIS — H6502 Acute serous otitis media, left ear: Secondary | ICD-10-CM

## 2014-04-09 DIAGNOSIS — H6500 Acute serous otitis media, unspecified ear: Secondary | ICD-10-CM | POA: Insufficient documentation

## 2014-04-09 DIAGNOSIS — J019 Acute sinusitis, unspecified: Secondary | ICD-10-CM

## 2014-04-09 MED ORDER — AMOXICILLIN-POT CLAVULANATE 875-125 MG PO TABS: 1.0000 | ORAL_TABLET | Freq: Two times a day (BID) | ORAL | Status: DC

## 2014-04-09 NOTE — Assessment & Plan Note (Signed)
With sore throat. Rapid strep negative. Rx Augmentin. Increase fluid intake. Rest. Saline nasal spray. Delsym for cough. Humidifier in bedroom. Daily probiotic.

## 2014-04-09 NOTE — Patient Instructions (Signed)
Please take antibiotic as directed.  Increase fluid intake.  Use Saline nasal spray.  Take a daily multivitamin. Delsym for cough.  Place a humidifier in the bedroom.  Please call or return clinic if symptoms are not improving.  Sinusitis Sinusitis is redness, soreness, and swelling (inflammation) of the paranasal sinuses. Paranasal sinuses are air pockets within the bones of your face (beneath the eyes, the middle of the forehead, or above the eyes). In healthy paranasal sinuses, mucus is able to drain out, and air is able to circulate through them by way of your nose. However, when your paranasal sinuses are inflamed, mucus and air can become trapped. This can allow bacteria and other germs to grow and cause infection. Sinusitis can develop quickly and last only a short time (acute) or continue over a long period (chronic). Sinusitis that lasts for more than 12 weeks is considered chronic.  CAUSES  Causes of sinusitis include:  Allergies.  Structural abnormalities, such as displacement of the cartilage that separates your nostrils (deviated septum), which can decrease the air flow through your nose and sinuses and affect sinus drainage.  Functional abnormalities, such as when the small hairs (cilia) that line your sinuses and help remove mucus do not work properly or are not present. SYMPTOMS  Symptoms of acute and chronic sinusitis are the same. The primary symptoms are pain and pressure around the affected sinuses. Other symptoms include:  Upper toothache.  Earache.  Headache.  Bad breath.  Decreased sense of smell and taste.  A cough, which worsens when you are lying flat.  Fatigue.  Fever.  Thick drainage from your nose, which often is green and may contain pus (purulent).  Swelling and warmth over the affected sinuses. DIAGNOSIS  Your caregiver will perform a physical exam. During the exam, your caregiver may:  Look in your nose for signs of abnormal growths in your  nostrils (nasal polyps).  Tap over the affected sinus to check for signs of infection.  View the inside of your sinuses (endoscopy) with a special imaging device with a light attached (endoscope), which is inserted into your sinuses. If your caregiver suspects that you have chronic sinusitis, one or more of the following tests may be recommended:  Allergy tests.  Nasal culture A sample of mucus is taken from your nose and sent to a lab and screened for bacteria.  Nasal cytology A sample of mucus is taken from your nose and examined by your caregiver to determine if your sinusitis is related to an allergy. TREATMENT  Most cases of acute sinusitis are related to a viral infection and will resolve on their own within 10 days. Sometimes medicines are prescribed to help relieve symptoms (pain medicine, decongestants, nasal steroid sprays, or saline sprays).  However, for sinusitis related to a bacterial infection, your caregiver will prescribe antibiotic medicines. These are medicines that will help kill the bacteria causing the infection.  Rarely, sinusitis is caused by a fungal infection. In theses cases, your caregiver will prescribe antifungal medicine. For some cases of chronic sinusitis, surgery is needed. Generally, these are cases in which sinusitis recurs more than 3 times per year, despite other treatments. HOME CARE INSTRUCTIONS   Drink plenty of water. Water helps thin the mucus so your sinuses can drain more easily.  Use a humidifier.  Inhale steam 3 to 4 times a day (for example, sit in the bathroom with the shower running).  Apply a warm, moist washcloth to your face 3 to 4   times a day, or as directed by your caregiver.  Use saline nasal sprays to help moisten and clean your sinuses.  Take over-the-counter or prescription medicines for pain, discomfort, or fever only as directed by your caregiver. SEEK IMMEDIATE MEDICAL CARE IF:  You have increasing pain or severe  headaches.  You have nausea, vomiting, or drowsiness.  You have swelling around your face.  You have vision problems.  You have a stiff neck.  You have difficulty breathing. MAKE SURE YOU:   Understand these instructions.  Will watch your condition.  Will get help right away if you are not doing well or get worse. Document Released: 07/25/2005 Document Revised: 10/17/2011 Document Reviewed: 08/09/2011 [employer] Patient Information 2014 [employer].   

## 2014-04-09 NOTE — Progress Notes (Signed)
Patient presents to clinic today c/o 1.5 weeks of sinus pressure, sinus pain, ear pain, sore throat, PND and cough.  Cough initially dry but now productive of a green sputum.  Patient denies shortness of breath, wheezing or pleuritic chest pain. Denies recent travel or sick contact.  Past Medical History  Diagnosis Date  . Hypertension   . History of breast cancer   . History of colonic polyps     Dr Collene Mares    Current Outpatient Prescriptions on File Prior to Visit  Medication Sig Dispense Refill  . albuterol (PROVENTIL HFA;VENTOLIN HFA) 108 (90 BASE) MCG/ACT inhaler Inhale 2 puffs into the lungs every 6 (six) hours as needed for wheezing or shortness of breath.  1 Inhaler  0  . buPROPion (WELLBUTRIN XL) 150 MG 24 hr tablet TAKE ONE TABLET BY MOUTH TWICE DAILY  60 tablet  0  . carvedilol (COREG) 25 MG tablet TAKE ONE TABLET BY MOUTH TWICE DAILY WITH MEALS  180 tablet  1  . citalopram (CELEXA) 40 MG tablet TAKE ONE TABLET BY MOUTH ONCE DAILY  30 tablet  0  . clonazePAM (KLONOPIN) 1 MG tablet Take 1 mg by mouth every 8 (eight) hours as needed for anxiety.      . hydrochlorothiazide (HYDRODIURIL) 25 MG tablet TAKE ONE TABLET BY MOUTH ONCE DAILY  90 tablet  4  . losartan (COZAAR) 100 MG tablet Take 1 tablet (100 mg total) by mouth daily.  30 tablet  3  . pantoprazole (PROTONIX) 40 MG tablet Take 1 tablet (40 mg total) by mouth daily.  30 tablet  3   No current facility-administered medications on file prior to visit.    No Known Allergies  Family History  Problem Relation Age of Onset  . Hypertension Mother   . Diabetes Mother   . Hypertension Sister   . Hypertension Brother     X 3  . Stroke Neg Hx   . Heart disease Neg Hx     History   Social History  . Marital Status: Single    Spouse Name: N/A    Number of Children: N/A  . Years of Education: N/A   Social History Main Topics  . Smoking status: Never Smoker   . Smokeless tobacco: Never Used  . Alcohol Use: No  . Drug  Use: No  . Sexual Activity: None   Other Topics Concern  . None   Social History Narrative  . None   Review of Systems - See HPI.  All other ROS are negative.  BP 145/70  Pulse 79  Temp(Src) 98.2 F (36.8 C)  Wt 237 lb (107.502 kg)  SpO2 96%  Physical Exam  Vitals reviewed. Constitutional: She is oriented to person, place, and time and well-developed, well-nourished, and in no distress.  HENT:  Head: Normocephalic and atraumatic.  Right Ear: Tympanic membrane, external ear and ear canal normal.  Left Ear: External ear and ear canal normal. Tympanic membrane is erythematous and bulging.  Nose: Mucosal edema present. Right sinus exhibits frontal sinus tenderness. Left sinus exhibits frontal sinus tenderness.  Mouth/Throat: Uvula is midline, oropharynx is clear and moist and mucous membranes are normal. No oropharyngeal exudate.  Eyes: Conjunctivae are normal. Pupils are equal, round, and reactive to light.  Neck: Neck supple.  Cardiovascular: Normal rate, regular rhythm, normal heart sounds and intact distal pulses.   Pulmonary/Chest: Effort normal and breath sounds normal. No respiratory distress. She has no wheezes. She has no rales. She exhibits no  tenderness.  Lymphadenopathy:    She has no cervical adenopathy.  Neurological: She is alert and oriented to person, place, and time.  Skin: Skin is warm and dry. No rash noted.  Psychiatric: Affect normal.   Recent Results (from the past 2160 hour(s))  CYTOLOGY - PAP     Status: None   Collection Time    03/10/14 12:00 AM      Result Value Ref Range   CYTOLOGY - PAP PAP RESULT      Assessment/Plan: Acute bacterial sinusitis With sore throat. Rapid strep negative. Rx Augmentin. Increase fluid intake. Rest. Saline nasal spray. Delsym for cough. Humidifier in bedroom. Daily probiotic.  Acute serous otitis media Also with acute bacterial sinusitis. Rx Augmentin.

## 2014-04-09 NOTE — Progress Notes (Signed)
Pre visit review using our clinic review tool, if applicable. No additional management support is needed unless otherwise documented below in the visit note. 

## 2014-04-09 NOTE — Assessment & Plan Note (Signed)
Also with acute bacterial sinusitis. Rx Augmentin.

## 2014-08-13 ENCOUNTER — Encounter: Payer: Self-pay | Admitting: Physician Assistant

## 2014-08-13 ENCOUNTER — Ambulatory Visit (INDEPENDENT_AMBULATORY_CARE_PROVIDER_SITE_OTHER): Payer: 59 | Admitting: Physician Assistant

## 2014-08-13 VITALS — BP 124/78 | HR 86 | Temp 98.3°F | Resp 16 | Ht 66.25 in | Wt 236.0 lb

## 2014-08-13 DIAGNOSIS — M5412 Radiculopathy, cervical region: Secondary | ICD-10-CM

## 2014-08-13 MED ORDER — TRAMADOL HCL 50 MG PO TABS: 50.0000 mg | ORAL_TABLET | Freq: Three times a day (TID) | ORAL | Status: DC | PRN

## 2014-08-13 NOTE — Patient Instructions (Addendum)
Please continue the Mobic twice daily (1 tablet every twelve hours).  Use tramadol as needed for breakthrough pain.  Avoid heavy lifting or overexertion.  You can use the heating pad but do not leave on for more than 10-15 minutes at a time.  Call if no improvement over the next 3-4 days.

## 2014-08-16 NOTE — Progress Notes (Signed)
Patient presents to clinic today c/o neck pain radiation into L shoulder and down L arm that has been present for 2 weeks.  Was seen over holidays at an UC where  X-ray of L shoulder was obtained revealing degenerative changes. Was given steroid pack with some relief in symptoms.  Past Medical History  Diagnosis Date  . Hypertension   . History of breast cancer   . History of colonic polyps     Dr Collene Mares    Current Outpatient Prescriptions on File Prior to Visit  Medication Sig Dispense Refill  . albuterol (PROVENTIL HFA;VENTOLIN HFA) 108 (90 BASE) MCG/ACT inhaler Inhale 2 puffs into the lungs every 6 (six) hours as needed for wheezing or shortness of breath. 1 Inhaler 0  . buPROPion (WELLBUTRIN XL) 150 MG 24 hr tablet TAKE ONE TABLET BY MOUTH TWICE DAILY 60 tablet 0  . carvedilol (COREG) 25 MG tablet TAKE ONE TABLET BY MOUTH TWICE DAILY WITH MEALS 180 tablet 1  . citalopram (CELEXA) 40 MG tablet TAKE ONE TABLET BY MOUTH ONCE DAILY 30 tablet 0  . clonazePAM (KLONOPIN) 1 MG tablet Take 1 mg by mouth every 8 (eight) hours as needed for anxiety.    . hydrochlorothiazide (HYDRODIURIL) 25 MG tablet TAKE ONE TABLET BY MOUTH ONCE DAILY 90 tablet 4  . losartan (COZAAR) 100 MG tablet Take 1 tablet (100 mg total) by mouth daily. 30 tablet 3  . pantoprazole (PROTONIX) 40 MG tablet Take 1 tablet (40 mg total) by mouth daily. 30 tablet 3   No current facility-administered medications on file prior to visit.    No Known Allergies  Family History  Problem Relation Age of Onset  . Hypertension Mother   . Diabetes Mother   . Hypertension Sister   . Hypertension Brother     X 3  . Stroke Neg Hx   . Heart disease Neg Hx     History   Social History  . Marital Status: Single    Spouse Name: N/A    Number of Children: N/A  . Years of Education: N/A   Social History Main Topics  . Smoking status: Never Smoker   . Smokeless tobacco: Never Used  . Alcohol Use: No  . Drug Use: No  .  Sexual Activity: None   Other Topics Concern  . None   Social History Narrative   Review of Systems - See HPI.  All other ROS are negative.  BP 124/78 mmHg  Pulse 86  Temp(Src) 98.3 F (36.8 C) (Oral)  Resp 16  Ht 5' 6.25" (1.683 m)  Wt 236 lb (107.049 kg)  BMI 37.79 kg/m2  SpO2 95%  Physical Exam  Constitutional: She is oriented to person, place, and time and well-developed, well-nourished, and in no distress.  HENT:  Head: Normocephalic and atraumatic.  Eyes: Conjunctivae are normal.  Neck: Neck supple.  Cardiovascular: Normal rate, regular rhythm, normal heart sounds and intact distal pulses.   Pulmonary/Chest: Effort normal and breath sounds normal. No respiratory distress. She has no wheezes. She has no rales. She exhibits no tenderness.  Musculoskeletal:       Cervical back: She exhibits pain. She exhibits normal range of motion, no tenderness, no bony tenderness and no spasm.  Neurological: She is alert and oriented to person, place, and time.  Skin: Skin is warm and dry. No rash noted.  Psychiatric: Affect normal.  Vitals reviewed.  Assessment/Plan: Acute cervical radiculopathy Patient wishes to avoid repeat course of steroids.  Rx Tramadol for severe pain.  Continue Mobic for inflammation.  Avoid heavy lifting or overexertion.  If no improvement over the next week, will need x-ray cervical spine and possible MRI.

## 2014-08-17 NOTE — Assessment & Plan Note (Signed)
Patient wishes to avoid repeat course of steroids. Rx Tramadol for severe pain.  Continue Mobic for inflammation.  Avoid heavy lifting or overexertion.  If no improvement over the next week, will need x-ray cervical spine and possible MRI.

## 2014-08-20 ENCOUNTER — Telehealth: Payer: Self-pay | Admitting: Physician Assistant

## 2014-08-20 ENCOUNTER — Other Ambulatory Visit: Payer: Self-pay | Admitting: Family Medicine

## 2014-08-20 MED ORDER — HYDROCODONE-ACETAMINOPHEN 10-325 MG PO TABS: 1.0000 | ORAL_TABLET | Freq: Three times a day (TID) | ORAL | Status: DC | PRN

## 2014-08-20 NOTE — Telephone Encounter (Signed)
Caller name: Beryl Relation to pt: self Call back number: [phone] Pharmacy: [facility] [address]  Reason for call:   Patient states that she is still in pain because of arithritis and would like to know if something stronger could be called in.

## 2014-08-20 NOTE — Telephone Encounter (Signed)
Last filled:  03/25/14 Amt: 30, 3 refills Last OV:  08/13/14  Med filled.

## 2014-08-20 NOTE — Telephone Encounter (Signed)
LMOM with contact name and number [for return call, if needed] [id number] management and further provider instructions/SLS

## 2014-08-20 NOTE — Telephone Encounter (Signed)
Stop Tramadol. Will give 60 tablets of hydrocodone.  Rx ready for pick up.  If symptoms not improving over the next week, I recommend an MRI of her neck or referral to specialist for further assessment.

## 2014-09-04 ENCOUNTER — Telehealth: Payer: Self-pay | Admitting: Physician Assistant

## 2014-09-04 NOTE — Telephone Encounter (Signed)
Error/gd °

## 2014-09-05 ENCOUNTER — Ambulatory Visit (INDEPENDENT_AMBULATORY_CARE_PROVIDER_SITE_OTHER): Payer: 59 | Admitting: Medical

## 2014-09-05 ENCOUNTER — Encounter: Payer: Self-pay | Admitting: Medical

## 2014-09-05 VITALS — BP 167/93 | HR 74 | Temp 98.2°F | Ht 66.25 in | Wt 233.0 lb

## 2014-09-05 DIAGNOSIS — M25519 Pain in unspecified shoulder: Secondary | ICD-10-CM | POA: Insufficient documentation

## 2014-09-05 DIAGNOSIS — M25512 Pain in left shoulder: Secondary | ICD-10-CM

## 2014-09-05 NOTE — Patient Instructions (Addendum)
For your lt shoulder pain and occasional neck pain, I will refer you to orthopedist. Continue with the mobic and hydrocodone.   I will go ahead and refer you to orthopedist.(Will try to get you in within 2 weeks) If worsens before then notify me and may try to get imaging studies._  Check your bp at home get otc cuff.If over 140/90 then would need med adjustment.  Follow up 3-4 wks or as needed.

## 2014-09-05 NOTE — Progress Notes (Signed)
Pre visit review using our clinic review tool, if applicable. No additional management support is needed unless otherwise documented below in the visit note. 

## 2014-09-05 NOTE — Progress Notes (Signed)
   Subjective:    Patient ID: Madeline Chandler, female    DOB: May 24, 1961, 54 y.o.   MRN: [id number]  HPI   Pt in with some lt shoulder pain before christmas. Pt states some pain in her neck occasional and rare.(no radiating pain down the entire arm). Pt recently reports some numbness to left 2nd digit at times. Some pain from elbow down to hand. Pt states when she moves shoulder has some posterior aspect pain.   Pt just been on mobic and on hydrocodone just recently.  Early on when first saw UC. She responded well to prednisone.  Pt bp is high today. No cardiac or neurologic symptoms. She tells me hx of white coat.     Review of Systems  Constitutional: Negative for fever, chills and fatigue.  Respiratory: Negative for cough, choking, shortness of breath and wheezing.   Cardiovascular: Negative for chest pain and palpitations.  Musculoskeletal:       Shoulder pain. Occasional neck pain  Neurological: Negative.        Very faint numb pad sensation lt second digit.  Hematological: Negative for adenopathy. Does not bruise/bleed easily.       Objective:   Physical Exam  General Mental Status- Alert. General Appearance- Not in acute distress.   Skin General: Color- Normal Color. Moisture- Normal Moisture.  Neck Carotid Arteries- Normal color. Moisture- Normal Moisture. No carotid bruits. No JVD.  Chest and Lung Exam Auscultation: Breath Sounds:-Normal.  Cardiovascular Auscultation:Rythm- Regular. Murmurs & Other Heart Sounds:Auscultation of the heart reveals- No Murmurs.     Neurologic Cranial Nerve exam:- CN III-XII intact(No nystagmus), symmetric smile. Drift Test:- No drift. Romberg Exam:- Negative.  Heal to Toe Gait exam:-Normal. Finger to Nose:- Normal/Intact Strength:- 5/5 equal and symmetric strength both upper and lower extremities.  Lt second digit- sharp and dull discriminition intact.  Lt shoulder- from, no creptius. But posterior aspect tendernss to  palpation. When shrugs shoulder pain increased.  Neck- no mid csine tenderness. No trapezius pain.  .       Assessment & Plan:

## 2014-09-05 NOTE — Assessment & Plan Note (Signed)
For your lt shoulder pain and occasional neck pain, I will refer you to orthopedist. Continue with the mobic and hydrocodone.   I will go ahead and refer you to orthopedist.(Will try to get you in within 2 weeks) If worsens before then notify me and may try to get imaging studies._

## 2014-11-28 ENCOUNTER — Other Ambulatory Visit: Payer: Self-pay | Admitting: Internal Medicine

## 2015-01-01 ENCOUNTER — Other Ambulatory Visit: Payer: Self-pay | Admitting: Physician Assistant

## 2015-01-01 ENCOUNTER — Telehealth: Payer: Self-pay | Admitting: Physician Assistant

## 2015-01-01 ENCOUNTER — Other Ambulatory Visit: Payer: Self-pay | Admitting: Internal Medicine

## 2015-01-01 NOTE — Telephone Encounter (Signed)
Relation to pt: self  Call back number:[phone] Pharmacy: [facility]  ([place]) [address], [place], [place] [address]  [phone]   Reason for call:  requesting a refill  hydrochlorothiazide (HYDRODIURIL) 25 MG tablet  carvedilol (COREG) 25 MG tablet  losartan (COZAAR) 100 MG tablet

## 2015-01-02 MED ORDER — HYDROCHLOROTHIAZIDE 25 MG PO TABS: ORAL_TABLET | ORAL | Status: DC

## 2015-01-02 MED ORDER — CARVEDILOL 25 MG PO TABS: 25.0000 mg | ORAL_TABLET | Freq: Two times a day (BID) | ORAL | Status: DC

## 2015-01-02 MED ORDER — LOSARTAN POTASSIUM 100 MG PO TABS: 100.0000 mg | ORAL_TABLET | Freq: Every day | ORAL | Status: DC

## 2015-01-02 NOTE — Telephone Encounter (Signed)
30-day supply given.  She needs to schedule a CPE with labs or at least follow-up before additional refills can be given.

## 2015-01-02 NOTE — Telephone Encounter (Signed)
LVM advising pt to schedule physical appointment and RX is ready for pick up at pharmacy

## 2015-02-27 ENCOUNTER — Ambulatory Visit (INDEPENDENT_AMBULATORY_CARE_PROVIDER_SITE_OTHER): Payer: 59 | Admitting: Physician Assistant

## 2015-02-27 ENCOUNTER — Encounter: Payer: Self-pay | Admitting: Physician Assistant

## 2015-02-27 VITALS — BP 132/68 | HR 90 | Temp 97.8°F | Ht 66.25 in | Wt 221.0 lb

## 2015-02-27 DIAGNOSIS — I10 Essential (primary) hypertension: Secondary | ICD-10-CM | POA: Diagnosis not present

## 2015-02-27 DIAGNOSIS — M62838 Other muscle spasm: Secondary | ICD-10-CM

## 2015-02-27 LAB — COMPREHENSIVE METABOLIC PANEL
ALBUMIN: 4 g/dL (ref 3.5–5.2)
ALT: 13 U/L (ref 0–35)
AST: 14 U/L (ref 0–37)
Alkaline Phosphatase: 105 U/L (ref 39–117)
BUN: 20 mg/dL (ref 6–23)
CO2: 33 mEq/L — ABNORMAL HIGH (ref 19–32)
Calcium: 10.2 mg/dL (ref 8.4–10.5)
Chloride: 100 mEq/L (ref 96–112)
Creatinine, Ser: 1 mg/dL (ref 0.40–1.20)
GFR: 74.02 mL/min (ref >60.00)
Glucose, Bld: 107 mg/dL — ABNORMAL HIGH (ref 70–99)
POTASSIUM: 3.4 meq/L — AB (ref 3.5–5.1)
Sodium: 139 mEq/L (ref 135–145)
Total Bilirubin: 0.4 mg/dL (ref 0.2–1.2)
Total Protein: 7.7 g/dL (ref 6.0–8.3)

## 2015-02-27 MED ORDER — HYDROCHLOROTHIAZIDE 25 MG PO TABS: ORAL_TABLET | ORAL | Status: DC

## 2015-02-27 MED ORDER — LOSARTAN POTASSIUM 100 MG PO TABS: 100.0000 mg | ORAL_TABLET | Freq: Every day | ORAL | Status: DC

## 2015-02-27 MED ORDER — CYCLOBENZAPRINE HCL 10 MG PO TABS: 10.0000 mg | ORAL_TABLET | Freq: Three times a day (TID) | ORAL | Status: DC | PRN

## 2015-02-27 MED ORDER — CITALOPRAM HYDROBROMIDE 40 MG PO TABS: 40.0000 mg | ORAL_TABLET | Freq: Every day | ORAL | Status: DC

## 2015-02-27 MED ORDER — CARVEDILOL 25 MG PO TABS: 25.0000 mg | ORAL_TABLET | Freq: Two times a day (BID) | ORAL | Status: DC

## 2015-02-27 NOTE — Progress Notes (Signed)
   Patient presents to clinic today for follow-up of BP. Patient currently on Carvedilol 25 mg BID, HCTZ 25 mg daily and Losartan 100 mg once daily. Endorses taking medications as directed. Patient denies chest pain, palpitations, lightheadedness, dizziness, vision changes or frequent headaches.  Patient also complains of bilateral hip pain mostly R sided hip pain x 3 months described as throbbing and achy. Has history of OA of multiple joints.. Endorses spasms of R anterior thigh over the past week. Denies trauma or injury or radiation of pain.   Past Medical History  Diagnosis Date  . Hypertension   . History of breast cancer   . History of colonic polyps     Dr Collene Mares    Current Outpatient Prescriptions on File Prior to Visit  Medication Sig Dispense Refill  . clonazePAM (KLONOPIN) 1 MG tablet Take 1 mg by mouth every 8 (eight) hours as needed for anxiety.    Marland Kitchen HYDROcodone-acetaminophen (NORCO) 10-325 MG per tablet Take 1 tablet by mouth every 8 (eight) hours as needed. 60 tablet 0  . meloxicam (MOBIC) 7.5 MG tablet Take 7.5 mg by mouth 2 (two) times daily.    . pantoprazole (PROTONIX) 40 MG tablet Take 1 tablet (40 mg total) by mouth daily. 30 tablet 3   No current facility-administered medications on file prior to visit.    No Known Allergies  Family History  Problem Relation Age of Onset  . Hypertension Mother   . Diabetes Mother   . Hypertension Sister   . Hypertension Brother     X 3  . Stroke Neg Hx   . Heart disease Neg Hx     History   Social History  . Marital Status: Single    Spouse Name: N/A  . Number of Children: N/A  . Years of Education: N/A   Social History Main Topics  . Smoking status: Never Smoker   . Smokeless tobacco: Never Used  . Alcohol Use: No  . Drug Use: No  . Sexual Activity: Not on file   Other Topics Concern  . None   Social History Narrative   Review of Systems - See HPI.  All other ROS are negative.  BP 132/68 mmHg  Pulse 90   Temp(Src) 97.8 F (36.6 C) (Oral)  Ht 5' 6.25" (1.683 m)  Wt 221 lb (100.245 kg)  BMI 35.39 kg/m2  SpO2 98%  Physical Exam  Constitutional: She is oriented to person, place, and time and well-developed, well-nourished, and in no distress.  HENT:  Head: Normocephalic and atraumatic.  Eyes: Conjunctivae are normal.  Cardiovascular: Normal rate, regular rhythm, normal heart sounds and intact distal pulses.   Pulmonary/Chest: Effort normal and breath sounds normal. No respiratory distress. She has no wheezes. She has no rales. She exhibits no tenderness.  Neurological: She is alert and oriented to person, place, and time.  Skin: Skin is warm and dry. No rash noted.  Psychiatric: Affect normal.  Vitals reviewed.  Assessment/Plan: Essential hypertension Stable. Asymptomatic. Continue current regimen. Medications refilled. Will check CMP today.  Muscle spasm Rx Flexeril at bedtime. Continue medications prescribed by Ortho. Supportive measures reviewed. Follow-up for CPE.

## 2015-02-27 NOTE — Assessment & Plan Note (Signed)
Rx Flexeril at bedtime. Continue medications prescribed by Ortho. Supportive measures reviewed. Follow-up for CPE.

## 2015-02-27 NOTE — Progress Notes (Signed)
Pre visit review using our clinic review tool, if applicable. No additional management support is needed unless otherwise documented below in the visit note. 

## 2015-02-27 NOTE — Patient Instructions (Signed)
Please continue blood pressure medications as directed. Limit salt intake as this will cause ankle swelling.  Please take Flexeril at bedtime. Can add on during day if you will not be driving to help with muscle spasm.  Continue pain medication as directed and add on a topical Aspercreme to the affected areas. Continue heating pad in 10 minute intervals.  Do not forget to stop by the lab today for blood work. I will call you with your results.

## 2015-02-27 NOTE — Assessment & Plan Note (Signed)
Stable. Asymptomatic. Continue current regimen. Medications refilled. Will check CMP today.

## 2015-03-05 ENCOUNTER — Telehealth: Payer: Self-pay | Admitting: Physician Assistant

## 2015-03-05 NOTE — Telephone Encounter (Signed)
Left a message for call back.  

## 2015-03-05 NOTE — Telephone Encounter (Signed)
Relation to EB:VPLW Call back number:[phone]   Reason for call:  Patient inquiring about lab results taken 02/27/15.

## 2015-03-06 NOTE — Telephone Encounter (Signed)
Pt aware of recent lab results.//AB/CMA

## 2015-03-26 ENCOUNTER — Ambulatory Visit (HOSPITAL_BASED_OUTPATIENT_CLINIC_OR_DEPARTMENT_OTHER)
Admission: RE | Admit: 2015-03-26 | Discharge: 2015-03-26 | Disposition: A | Discharge status: Home / Self Care | Payer: 59 | Source: Ambulatory Visit | Attending: Medical | Admitting: Medical

## 2015-03-26 ENCOUNTER — Telehealth: Payer: Self-pay | Admitting: Medical

## 2015-03-26 ENCOUNTER — Ambulatory Visit (INDEPENDENT_AMBULATORY_CARE_PROVIDER_SITE_OTHER): Payer: 59 | Admitting: Medical

## 2015-03-26 ENCOUNTER — Telehealth: Payer: Self-pay | Admitting: Physician Assistant

## 2015-03-26 ENCOUNTER — Encounter: Payer: Self-pay | Admitting: Medical

## 2015-03-26 VITALS — BP 155/82 | HR 104 | Temp 97.7°F | Ht 66.25 in | Wt 223.8 lb

## 2015-03-26 DIAGNOSIS — M25559 Pain in unspecified hip: Secondary | ICD-10-CM

## 2015-03-26 DIAGNOSIS — M25552 Pain in left hip: Secondary | ICD-10-CM | POA: Insufficient documentation

## 2015-03-26 DIAGNOSIS — M25551 Pain in right hip: Secondary | ICD-10-CM | POA: Insufficient documentation

## 2015-03-26 DIAGNOSIS — M8700 Idiopathic aseptic necrosis of unspecified bone: Secondary | ICD-10-CM

## 2015-03-26 MED ORDER — KETOROLAC TROMETHAMINE 60 MG/2ML IM SOLN
60.0000 mg | Freq: Once | INTRAMUSCULAR | Status: AC
  Administered 2015-03-26: 60 mg via INTRAMUSCULAR

## 2015-03-26 MED ORDER — HYDROCODONE-ACETAMINOPHEN 5-325 MG PO TABS: 1.0000 | ORAL_TABLET | Freq: Four times a day (QID) | ORAL | Status: DC | PRN

## 2015-03-26 MED ORDER — MELOXICAM 15 MG PO TABS: 15.0000 mg | ORAL_TABLET | Freq: Every day | ORAL | Status: DC

## 2015-03-26 NOTE — Telephone Encounter (Signed)
MRI of both hips order placed. Referral staff should contact pt. Please notify her.

## 2015-03-26 NOTE — Telephone Encounter (Signed)
pre visit letter mailed 03/13/15 °

## 2015-03-26 NOTE — Progress Notes (Signed)
Subjective:    Patient ID: Madeline Chandler, female    DOB: 09-12-1960, 54 y.o.   MRN: [id number]  HPI   Pt in with some hip pain. She states both hips have been hurting for 3 months. Pt states rt hip area worse today but at times left side hip pain is worse. Hard to say which hip hurts the worst on daily basis. Pt takes some norco for the pain in the past.   Pt was on meloxicam 15 mg in past. But she ran out of meloxciam yesterday.   Pt has been on hydrocodone since this January. But that was primarily  for neck pain.(neck pain is better now)       Review of Systems  Constitutional: Negative for fever, chills and fatigue.  Respiratory: Negative for cough, shortness of breath and wheezing.   Cardiovascular: Negative for chest pain and palpitations.  Musculoskeletal:       Hip pain.  Hematological: Negative for adenopathy. Does not bruise/bleed easily.    Past Medical History  Diagnosis Date  . Hypertension   . History of breast cancer   . History of colonic polyps     Dr Collene Mares    Social History   Social History  . Marital Status: Single    Spouse Name: N/A  . Number of Children: N/A  . Years of Education: N/A   Occupational History  . Not on file.   Social History Main Topics  . Smoking status: Never Smoker   . Smokeless tobacco: Never Used  . Alcohol Use: No  . Drug Use: No  . Sexual Activity: Not on file   Other Topics Concern  . Not on file   Social History Narrative    Past Surgical History  Procedure Laterality Date  . Dilation and curettage of uterus    . Breast lumpectomy  1997  . Colonoscopy w/ polypectomy  2010    Dr Collene Mares    Family History  Problem Relation Age of Onset  . Hypertension Mother   . Diabetes Mother   . Hypertension Sister   . Hypertension Brother     X 3  . Stroke Neg Hx   . Heart disease Neg Hx     No Known Allergies  Current Outpatient Prescriptions on File Prior to Visit  Medication Sig Dispense Refill  .  carvedilol (COREG) 25 MG tablet Take 1 tablet (25 mg total) by mouth 2 (two) times daily with a meal. 60 tablet 5  . citalopram (CELEXA) 40 MG tablet Take 1 tablet (40 mg total) by mouth daily. 30 tablet 5  . clonazePAM (KLONOPIN) 1 MG tablet Take 1 mg by mouth every 8 (eight) hours as needed for anxiety.    . cyclobenzaprine (FLEXERIL) 10 MG tablet Take 1 tablet (10 mg total) by mouth 3 (three) times daily as needed for muscle spasms. 30 tablet 0  . hydrochlorothiazide (HYDRODIURIL) 25 MG tablet TAKE ONE TABLET BY MOUTH ONCE DAILY 60 tablet 5  . HYDROcodone-acetaminophen (NORCO) 10-325 MG per tablet Take 1 tablet by mouth every 8 (eight) hours as needed. 60 tablet 0  . meloxicam (MOBIC) 7.5 MG tablet Take 7.5 mg by mouth 2 (two) times daily.    . pantoprazole (PROTONIX) 40 MG tablet Take 1 tablet (40 mg total) by mouth daily. 30 tablet 3  . losartan (COZAAR) 100 MG tablet Take 1 tablet (100 mg total) by mouth daily. 30 tablet 5   No current facility-administered medications on file  prior to visit.    BP 155/82 mmHg  Pulse 104  Temp(Src) 97.7 F (36.5 C) (Oral)  Ht 5' 6.25" (1.683 m)  Wt 223 lb 12.8 oz (101.515 kg)  BMI 35.84 kg/m2  SpO2 100%       Objective:   Physical Exam  General- No acute distress. Pleasant patient. But in pain when she walks. Severe pain. Neck- Full range of motion, no jvd Lungs- Clear, even and unlabored. Heart- regular rate and rhythm. Neurologic- CNII- XII grossly intact.  Hips- both sides very painful on abduction, addcuction and rotation. Rt side>than lt side.       Assessment & Plan:  Toradol 60 mg im.  Meloxicam refill.   Rx hydrocodone.  X-rays of both hips today.  Follow up in 7-10 days or as needed  May refer to ortho depending on xray results.

## 2015-03-26 NOTE — Progress Notes (Signed)
Pre visit review using our clinic review tool, if applicable. No additional management support is needed unless otherwise documented below in the visit note. 

## 2015-03-26 NOTE — Patient Instructions (Addendum)
  Toradol 60 mg im.  Meloxicam refill.   Rx hydrocodone.  X-rays of both hips today.  Follow up in 7-10 days or as needed  May refer to ortho depending on xray results.

## 2015-03-27 NOTE — Telephone Encounter (Signed)
Left message for patient to return call regarding new orders.

## 2015-03-27 NOTE — Telephone Encounter (Signed)
Spoke with patient regarding MRI both hips.

## 2015-04-01 ENCOUNTER — Telehealth: Payer: Self-pay

## 2015-04-01 NOTE — Telephone Encounter (Signed)
LMOVM

## 2015-04-03 ENCOUNTER — Encounter: Payer: Self-pay | Admitting: Physician Assistant

## 2015-04-03 ENCOUNTER — Ambulatory Visit (INDEPENDENT_AMBULATORY_CARE_PROVIDER_SITE_OTHER): Payer: 59 | Admitting: Physician Assistant

## 2015-04-03 ENCOUNTER — Telehealth: Payer: Self-pay | Admitting: *Deleted

## 2015-04-03 ENCOUNTER — Other Ambulatory Visit: Payer: Self-pay | Admitting: Physician Assistant

## 2015-04-03 VITALS — BP 134/82 | HR 84 | Temp 97.7°F | Resp 16 | Ht 66.25 in | Wt 224.2 lb

## 2015-04-03 DIAGNOSIS — K2100 Gastro-esophageal reflux disease with esophagitis, without bleeding: Secondary | ICD-10-CM

## 2015-04-03 DIAGNOSIS — I10 Essential (primary) hypertension: Secondary | ICD-10-CM | POA: Diagnosis not present

## 2015-04-03 DIAGNOSIS — M87051 Idiopathic aseptic necrosis of right femur: Secondary | ICD-10-CM

## 2015-04-03 DIAGNOSIS — Z0000 Encounter for general adult medical examination without abnormal findings: Secondary | ICD-10-CM | POA: Diagnosis not present

## 2015-04-03 DIAGNOSIS — M87052 Idiopathic aseptic necrosis of left femur: Secondary | ICD-10-CM

## 2015-04-03 LAB — CBC
HCT: 36.7 % (ref 36.0–46.0)
Hemoglobin: 12.3 g/dL (ref 12.0–15.0)
MCHC: 33.4 g/dL (ref 30.0–36.0)
MCV: 87.1 fl (ref 78.0–100.0)
PLATELETS: 290 10*3/uL (ref 150.0–400.0)
RBC: 4.21 Mil/uL (ref 3.87–5.11)
RDW: 14.1 % (ref 11.5–15.5)
WBC: 5.2 10*3/uL (ref 4.0–10.5)

## 2015-04-03 LAB — BASIC METABOLIC PANEL
BUN: 13 mg/dL (ref 6–23)
CHLORIDE: 100 meq/L (ref 96–112)
CO2: 29 meq/L (ref 19–32)
Calcium: 10.2 mg/dL (ref 8.4–10.5)
Creatinine, Ser: 0.88 mg/dL (ref 0.40–1.20)
GFR: 85.76 mL/min (ref >60.00)
Glucose, Bld: 98 mg/dL (ref 70–99)
POTASSIUM: 3.6 meq/L (ref 3.5–5.1)
Sodium: 138 mEq/L (ref 135–145)

## 2015-04-03 LAB — HEPATITIS C ANTIBODY: HCV Ab: NEGATIVE

## 2015-04-03 LAB — LIPID PANEL
CHOL/HDL RATIO: 4
Cholesterol: 156 mg/dL (ref 0–200)
HDL: 40.5 mg/dL (ref >39.00)
LDL Cholesterol: 100 mg/dL — ABNORMAL HIGH (ref 0–99)
NonHDL: 115.11
TRIGLYCERIDES: 78 mg/dL (ref 0.0–149.0)
VLDL: 15.6 mg/dL (ref 0.0–40.0)

## 2015-04-03 LAB — TSH: TSH: 1.16 u[IU]/mL (ref 0.35–4.50)

## 2015-04-03 LAB — HEMOGLOBIN A1C: HEMOGLOBIN A1C: 6.5 % (ref 4.6–6.5)

## 2015-04-03 MED ORDER — PANTOPRAZOLE SODIUM 40 MG PO TBEC: 40.0000 mg | DELAYED_RELEASE_TABLET | Freq: Every day | ORAL | Status: DC

## 2015-04-03 MED ORDER — KETOROLAC TROMETHAMINE 60 MG/2ML IM SOLN
60.0000 mg | Freq: Once | INTRAMUSCULAR | Status: AC
  Administered 2015-04-03: 60 mg via INTRAMUSCULAR

## 2015-04-03 NOTE — Patient Instructions (Signed)
Please go to the lab for blood work.  I will call you with your results. If your blood work is normal we will follow-up yearly for physicals.   You will be contacted for MRI and for an appointment with Orthopedic Surgery. Avoid heavy lifting and overexertion.  Take pain medications as directed. If pain acutely worsens before seeing Ortho,please go to the ER.  Continue other medications as directed.  Preventive Care for Adults A healthy lifestyle and preventive care can promote health and wellness. Preventive health guidelines for women include the following key practices.  A routine yearly physical is a good way to check with your health care provider about your health and preventive screening. It is a chance to share any concerns and updates on your health and to receive a thorough exam.  Visit your dentist for a routine exam and preventive care every 6 months. Brush your teeth twice a day and floss once a day. Good oral hygiene prevents tooth decay and gum disease.  The frequency of eye exams is based on your age, health, family medical history, use of contact lenses, and other factors. Follow your health care provider's recommendations for frequency of eye exams.  Eat a healthy diet. Foods like vegetables, fruits, whole grains, low-fat dairy products, and lean protein foods contain the nutrients you need without too many calories. Decrease your intake of foods high in solid fats, added sugars, and salt. Eat the right amount of calories for you.Get information about a proper diet from your health care provider, if necessary.  Regular physical exercise is one of the most important things you can do for your health. Most adults should get at least 150 minutes of moderate-intensity exercise (any activity that increases your heart rate and causes you to sweat) each week. In addition, most adults need muscle-strengthening exercises on 2 or more days a week.  Maintain a healthy weight. The body  mass index (BMI) is a screening tool to identify possible weight problems. It provides an estimate of body fat based on height and weight. Your health care provider can find your BMI and can help you achieve or maintain a healthy weight.For adults 20 years and older:  A BMI below 18.5 is considered underweight.  A BMI of 18.5 to 24.9 is normal.  A BMI of 25 to 29.9 is considered overweight.  A BMI of 30 and above is considered obese.  Maintain normal blood lipids and cholesterol levels by exercising and minimizing your intake of saturated fat. Eat a balanced diet with plenty of fruit and vegetables. Blood tests for lipids and cholesterol should begin at age 56 and be repeated every 5 years. If your lipid or cholesterol levels are high, you are over 50, or you are at high risk for heart disease, you may need your cholesterol levels checked more frequently.Ongoing high lipid and cholesterol levels should be treated with medicines if diet and exercise are not working.  If you smoke, find out from your health care provider how to quit. If you do not use tobacco, do not start.  Lung cancer screening is recommended for adults aged 32-80 years who are at high risk for developing lung cancer because of a history of smoking. A yearly low-dose CT scan of the lungs is recommended for people who have at least a 30-pack-year history of smoking and are a current smoker or have quit within the past 15 years. A pack year of smoking is smoking an average of 1 pack of  cigarettes a day for 1 year (for example: 1 pack a day for 30 years or 2 packs a day for 15 years). Yearly screening should continue until the smoker has stopped smoking for at least 15 years. Yearly screening should be stopped for people who develop a health problem that would prevent them from having lung cancer treatment.  If you are pregnant, do not drink alcohol. If you are breastfeeding, be very cautious about drinking alcohol. If you are not  pregnant and choose to drink alcohol, do not have more than 1 drink per day. One drink is considered to be 12 ounces (355 mL) of beer, 5 ounces (148 mL) of wine, or 1.5 ounces (44 mL) of liquor.  Avoid use of street drugs. Do not share needles with anyone. Ask for help if you need support or instructions about stopping the use of drugs.  High blood pressure causes heart disease and increases the risk of stroke. Your blood pressure should be checked at least every 1 to 2 years. Ongoing high blood pressure should be treated with medicines if weight loss and exercise do not work.  If you are 53-101 years old, ask your health care provider if you should take aspirin to prevent strokes.  Diabetes screening involves taking a blood sample to check your fasting blood sugar level. This should be done once every 3 years, after age 42, if you are within normal weight and without risk factors for diabetes. Testing should be considered at a younger age or be carried out more frequently if you are overweight and have at least 1 risk factor for diabetes.  Breast cancer screening is essential preventive care for women. You should practice "breast self-awareness." This means understanding the normal appearance and feel of your breasts and may include breast self-examination. Any changes detected, no matter how small, should be reported to a health care provider. Women in their 52s and 30s should have a clinical breast exam (CBE) by a health care provider as part of a regular health exam every 1 to 3 years. After age 51, women should have a CBE every year. Starting at age 59, women should consider having a mammogram (breast X-ray test) every year. Women who have a family history of breast cancer should talk to their health care provider about genetic screening. Women at a high risk of breast cancer should talk to their health care providers about having an MRI and a mammogram every year.  Breast cancer gene (BRCA)-related  cancer risk assessment is recommended for women who have family members with BRCA-related cancers. BRCA-related cancers include breast, ovarian, tubal, and peritoneal cancers. Having family members with these cancers may be associated with an increased risk for harmful changes (mutations) in the breast cancer genes BRCA1 and BRCA2. Results of the assessment will determine the need for genetic counseling and BRCA1 and BRCA2 testing.  Routine pelvic exams to screen for cancer are no longer recommended for nonpregnant women who are considered low risk for cancer of the pelvic organs (ovaries, uterus, and vagina) and who do not have symptoms. Ask your health care provider if a screening pelvic exam is right for you.  If you have had past treatment for cervical cancer or a condition that could lead to cancer, you need Pap tests and screening for cancer for at least 20 years after your treatment. If Pap tests have been discontinued, your risk factors (such as having a new sexual partner) need to be reassessed to determine if screening  should be resumed. Some women have medical problems that increase the chance of getting cervical cancer. In these cases, your health care provider may recommend more frequent screening and Pap tests.  The HPV test is an additional test that may be used for cervical cancer screening. The HPV test looks for the virus that can cause the cell changes on the cervix. The cells collected during the Pap test can be tested for HPV. The HPV test could be used to screen women aged 28 years and older, and should be used in women of any age who have unclear Pap test results. After the age of 65, women should have HPV testing at the same frequency as a Pap test.  Colorectal cancer can be detected and often prevented. Most routine colorectal cancer screening begins at the age of 89 years and continues through age 35 years. However, your health care provider may recommend screening at an earlier age  if you have risk factors for colon cancer. On a yearly basis, your health care provider may provide home test kits to check for hidden blood in the stool. Use of a small camera at the end of a tube, to directly examine the colon (sigmoidoscopy or colonoscopy), can detect the earliest forms of colorectal cancer. Talk to your health care provider about this at age 15, when routine screening begins. Direct exam of the colon should be repeated every 5-10 years through age 7 years, unless early forms of pre-cancerous polyps or small growths are found.  People who are at an increased risk for hepatitis B should be screened for this virus. You are considered at high risk for hepatitis B if:  You were born in a country where hepatitis B occurs often. Talk with your health care provider about which countries are considered high risk.  Your parents were born in a high-risk country and you have not received a shot to protect against hepatitis B (hepatitis B vaccine).  You have HIV or AIDS.  You use needles to inject street drugs.  You live with, or have sex with, someone who has hepatitis B.  You get hemodialysis treatment.  You take certain medicines for conditions like cancer, organ transplantation, and autoimmune conditions.  Hepatitis C blood testing is recommended for all people born from [birth year] through 1965 and any individual with known risks for hepatitis C.  Practice safe sex. Use condoms and avoid high-risk sexual practices to reduce the spread of sexually transmitted infections (STIs). STIs include gonorrhea, chlamydia, syphilis, trichomonas, herpes, HPV, and human immunodeficiency virus (HIV). Herpes, HIV, and HPV are viral illnesses that have no cure. They can result in disability, cancer, and death.  You should be screened for sexually transmitted illnesses (STIs) including gonorrhea and chlamydia if:  You are sexually active and are younger than 24 years.  You are older than 24 years and  your health care provider tells you that you are at risk for this type of infection.  Your sexual activity has changed since you were last screened and you are at an increased risk for chlamydia or gonorrhea. Ask your health care provider if you are at risk.  If you are at risk of being infected with HIV, it is recommended that you take a prescription medicine daily to prevent HIV infection. This is called preexposure prophylaxis (PrEP). You are considered at risk if:  You are a heterosexual woman, are sexually active, and are at increased risk for HIV infection.  You take drugs by injection.  You are sexually active with a partner who has HIV.  Talk with your health care provider about whether you are at high risk of being infected with HIV. If you choose to begin PrEP, you should first be tested for HIV. You should then be tested every 3 months for as long as you are taking PrEP.  Osteoporosis is a disease in which the bones lose minerals and strength with aging. This can result in serious bone fractures or breaks. The risk of osteoporosis can be identified using a bone density scan. Women ages 20 years and over and women at risk for fractures or osteoporosis should discuss screening with their health care providers. Ask your health care provider whether you should take a calcium supplement or vitamin D to reduce the rate of osteoporosis.  Menopause can be associated with physical symptoms and risks. Hormone replacement therapy is available to decrease symptoms and risks. You should talk to your health care provider about whether hormone replacement therapy is right for you.  Use sunscreen. Apply sunscreen liberally and repeatedly throughout the day. You should seek shade when your shadow is shorter than you. Protect yourself by wearing long sleeves, pants, a wide-brimmed hat, and sunglasses year round, whenever you are outdoors.  Once a month, do a whole body skin exam, using a mirror to look  at the skin on your back. Tell your health care provider of new moles, moles that have irregular borders, moles that are larger than a pencil eraser, or moles that have changed in shape or color.  Stay current with required vaccines (immunizations).  Influenza vaccine. All adults should be immunized every year.  Tetanus, diphtheria, and acellular pertussis (Td, Tdap) vaccine. Pregnant women should receive 1 dose of Tdap vaccine during each pregnancy. The dose should be obtained regardless of the length of time since the last dose. Immunization is preferred during the 27th-36th week of gestation. An adult who has not previously received Tdap or who does not know her vaccine status should receive 1 dose of Tdap. This initial dose should be followed by tetanus and diphtheria toxoids (Td) booster doses every 10 years. Adults with an unknown or incomplete history of completing a 3-dose immunization series with Td-containing vaccines should begin or complete a primary immunization series including a Tdap dose. Adults should receive a Td booster every 10 years.  Varicella vaccine. An adult without evidence of immunity to varicella should receive 2 doses or a second dose if she has previously received 1 dose. Pregnant females who do not have evidence of immunity should receive the first dose after pregnancy. This first dose should be obtained before leaving the health care facility. The second dose should be obtained 4-8 weeks after the first dose.  Human papillomavirus (HPV) vaccine. Females aged 13-26 years who have not received the vaccine previously should obtain the 3-dose series. The vaccine is not recommended for use in pregnant females. However, pregnancy testing is not needed before receiving a dose. If a female is found to be pregnant after receiving a dose, no treatment is needed. In that case, the remaining doses should be delayed until after the pregnancy. Immunization is recommended for any person  with an immunocompromised condition through the age of 72 years if she did not get any or all doses earlier. During the 3-dose series, the second dose should be obtained 4-8 weeks after the first dose. The third dose should be obtained 24 weeks after the first dose and 16 weeks after the  second dose.  Zoster vaccine. One dose is recommended for adults aged 52 years or older unless certain conditions are present.  Measles, mumps, and rubella (MMR) vaccine. Adults born before [birth year] generally are considered immune to measles and mumps. Adults born in [birth year] or later should have 1 or more doses of MMR vaccine unless there is a contraindication to the vaccine or there is laboratory evidence of immunity to each of the three diseases. A routine second dose of MMR vaccine should be obtained at least 28 days after the first dose for students attending postsecondary schools, health care workers, or international travelers. People who received inactivated measles vaccine or an unknown type of measles vaccine during 1963-1967 should receive 2 doses of MMR vaccine. People who received inactivated mumps vaccine or an unknown type of mumps vaccine before 1979 and are at high risk for mumps infection should consider immunization with 2 doses of MMR vaccine. For females of childbearing age, rubella immunity should be determined. If there is no evidence of immunity, females who are not pregnant should be vaccinated. If there is no evidence of immunity, females who are pregnant should delay immunization until after pregnancy. Unvaccinated health care workers born before [birth year] who lack laboratory evidence of measles, mumps, or rubella immunity or laboratory confirmation of disease should consider measles and mumps immunization with 2 doses of MMR vaccine or rubella immunization with 1 dose of MMR vaccine.  Pneumococcal 13-valent conjugate (PCV13) vaccine. When indicated, a person who is uncertain of her immunization history and has  no record of immunization should receive the PCV13 vaccine. An adult aged 30 years or older who has certain medical conditions and has not been previously immunized should receive 1 dose of PCV13 vaccine. This PCV13 should be followed with a dose of pneumococcal polysaccharide (PPSV23) vaccine. The PPSV23 vaccine dose should be obtained at least 8 weeks after the dose of PCV13 vaccine. An adult aged 65 years or older who has certain medical conditions and previously received 1 or more doses of PPSV23 vaccine should receive 1 dose of PCV13. The PCV13 vaccine dose should be obtained 1 or more years after the last PPSV23 vaccine dose.  Pneumococcal polysaccharide (PPSV23) vaccine. When PCV13 is also indicated, PCV13 should be obtained first. All adults aged 37 years and older should be immunized. An adult younger than age 39 years who has certain medical conditions should be immunized. Any person who resides in a nursing home or long-term care facility should be immunized. An adult smoker should be immunized. People with an immunocompromised condition and certain other conditions should receive both PCV13 and PPSV23 vaccines. People with human immunodeficiency virus (HIV) infection should be immunized as soon as possible after diagnosis. Immunization during chemotherapy or radiation therapy should be avoided. Routine use of PPSV23 vaccine is not recommended for American Indians, [place] Natives, or people younger than 65 years unless there are medical conditions that require PPSV23 vaccine. When indicated, people who have unknown immunization and have no record of immunization should receive PPSV23 vaccine. One-time revaccination 5 years after the first dose of PPSV23 is recommended for people aged 19-64 years who have chronic kidney failure, nephrotic syndrome, asplenia, or immunocompromised conditions. People who received 1-2 doses of PPSV23 before age 51 years should receive another dose of PPSV23 vaccine at age 39  years or later if at least 5 years have passed since the previous dose. Doses of PPSV23 are not needed for people immunized with PPSV23 at or after age 9  years.  Meningococcal vaccine. Adults with asplenia or persistent complement component deficiencies should receive 2 doses of quadrivalent meningococcal conjugate (MenACWY-D) vaccine. The doses should be obtained at least 2 months apart. Microbiologists working with certain meningococcal bacteria, [employer] recruits, people at risk during an outbreak, and people who travel to or live in countries with a high rate of meningitis should be immunized. A first-year college student up through age 89 years who is living in a residence hall should receive a dose if she did not receive a dose on or after her 16th birthday. Adults who have certain high-risk conditions should receive one or more doses of vaccine.  Hepatitis A vaccine. Adults who wish to be protected from this disease, have certain high-risk conditions, work with hepatitis A-infected animals, work in hepatitis A research labs, or travel to or work in countries with a high rate of hepatitis A should be immunized. Adults who were previously unvaccinated and who anticipate close contact with an international adoptee during the first 60 days after arrival in the [place] from a country with a high rate of hepatitis A should be immunized.  Hepatitis B vaccine. Adults who wish to be protected from this disease, have certain high-risk conditions, may be exposed to blood or other infectious body fluids, are household contacts or sex partners of hepatitis B positive people, are clients or workers in certain care facilities, or travel to or work in countries with a high rate of hepatitis B should be immunized.  Haemophilus influenzae type b (Hib) vaccine. A previously unvaccinated person with asplenia or sickle cell disease or having a scheduled splenectomy should receive 1 dose of Hib vaccine. Regardless  of previous immunization, a recipient of a hematopoietic stem cell transplant should receive a 3-dose series 6-12 months after her successful transplant. Hib vaccine is not recommended for adults with HIV infection. Preventive Services / Frequency Ages 39 to 5 years  Blood pressure check.** / Every 1 to 2 years.  Lipid and cholesterol check.** / Every 5 years beginning at age 6.  Clinical breast exam.** / Every 3 years for women in their 58s and 5s.  BRCA-related cancer risk assessment.** / For women who have family members with a BRCA-related cancer (breast, ovarian, tubal, or peritoneal cancers).  Pap test.** / Every 2 years from ages 63 through 28. Every 3 years starting at age 45 through age 67 or 25 with a history of 3 consecutive normal Pap tests.  HPV screening.** / Every 3 years from ages 34 through ages 72 to 29 with a history of 3 consecutive normal Pap tests.  Hepatitis C blood test.** / For any individual with known risks for hepatitis C.  Skin self-exam. / Monthly.  Influenza vaccine. / Every year.  Tetanus, diphtheria, and acellular pertussis (Tdap, Td) vaccine.** / Consult your health care provider. Pregnant women should receive 1 dose of Tdap vaccine during each pregnancy. 1 dose of Td every 10 years.  Varicella vaccine.** / Consult your health care provider. Pregnant females who do not have evidence of immunity should receive the first dose after pregnancy.  HPV vaccine. / 3 doses over 6 months, if 65 and younger. The vaccine is not recommended for use in pregnant females. However, pregnancy testing is not needed before receiving a dose.  Measles, mumps, rubella (MMR) vaccine.** / You need at least 1 dose of MMR if you were born in 1957 or later. You may also need a 2nd dose. For females of childbearing age, rubella  immunity should be determined. If there is no evidence of immunity, females who are not pregnant should be vaccinated. If there is no evidence of immunity,  females who are pregnant should delay immunization until after pregnancy.  Pneumococcal 13-valent conjugate (PCV13) vaccine.** / Consult your health care provider.  Pneumococcal polysaccharide (PPSV23) vaccine.** / 1 to 2 doses if you smoke cigarettes or if you have certain conditions.  Meningococcal vaccine.** / 1 dose if you are age 55 to 52 years and a Market researcher living in a residence hall, or have one of several medical conditions, you need to get vaccinated against meningococcal disease. You may also need additional booster doses.  Hepatitis A vaccine.** / Consult your health care provider.  Hepatitis B vaccine.** / Consult your health care provider.  Haemophilus influenzae type b (Hib) vaccine.** / Consult your health care provider. Ages 39 to 32 years  Blood pressure check.** / Every 1 to 2 years.  Lipid and cholesterol check.** / Every 5 years beginning at age 68 years.  Lung cancer screening. / Every year if you are aged 22-80 years and have a 30-pack-year history of smoking and currently smoke or have quit within the past 15 years. Yearly screening is stopped once you have quit smoking for at least 15 years or develop a health problem that would prevent you from having lung cancer treatment.  Clinical breast exam.** / Every year after age 66 years.  BRCA-related cancer risk assessment.** / For women who have family members with a BRCA-related cancer (breast, ovarian, tubal, or peritoneal cancers).  Mammogram.** / Every year beginning at age 57 years and continuing for as long as you are in good health. Consult with your health care provider.  Pap test.** / Every 3 years starting at age 83 years through age 31 or 40 years with a history of 3 consecutive normal Pap tests.  HPV screening.** / Every 3 years from ages 2 years through ages 58 to 36 years with a history of 3 consecutive normal Pap tests.  Fecal occult blood test (FOBT) of stool. / Every year  beginning at age 60 years and continuing until age 56 years. You may not need to do this test if you get a colonoscopy every 10 years.  Flexible sigmoidoscopy or colonoscopy.** / Every 5 years for a flexible sigmoidoscopy or every 10 years for a colonoscopy beginning at age 80 years and continuing until age 16 years.  Hepatitis C blood test.** / For all people born from [birth year] through 1966 and any individual with known risks for hepatitis C.  Skin self-exam. / Monthly.  Influenza vaccine. / Every year.  Tetanus, diphtheria, and acellular pertussis (Tdap/Td) vaccine.** / Consult your health care provider. Pregnant women should receive 1 dose of Tdap vaccine during each pregnancy. 1 dose of Td every 10 years.  Varicella vaccine.** / Consult your health care provider. Pregnant females who do not have evidence of immunity should receive the first dose after pregnancy.  Zoster vaccine.** / 1 dose for adults aged 11 years or older.  Measles, mumps, rubella (MMR) vaccine.** / You need at least 1 dose of MMR if you were born in 1957 or later. You may also need a 2nd dose. For females of childbearing age, rubella immunity should be determined. If there is no evidence of immunity, females who are not pregnant should be vaccinated. If there is no evidence of immunity, females who are pregnant should delay immunization until after pregnancy.  Pneumococcal 13-valent conjugate (  PCV13) vaccine.** / Consult your health care provider.  Pneumococcal polysaccharide (PPSV23) vaccine.** / 1 to 2 doses if you smoke cigarettes or if you have certain conditions.  Meningococcal vaccine.** / Consult your health care provider.  Hepatitis A vaccine.** / Consult your health care provider.  Hepatitis B vaccine.** / Consult your health care provider.  Haemophilus influenzae type b (Hib) vaccine.** / Consult your health care provider. Ages 87 years and over  Blood pressure check.** / Every 1 to 2 years.  Lipid and  cholesterol check.** / Every 5 years beginning at age 94 years.  Lung cancer screening. / Every year if you are aged 7-80 years and have a 30-pack-year history of smoking and currently smoke or have quit within the past 15 years. Yearly screening is stopped once you have quit smoking for at least 15 years or develop a health problem that would prevent you from having lung cancer treatment.  Clinical breast exam.** / Every year after age 34 years.  BRCA-related cancer risk assessment.** / For women who have family members with a BRCA-related cancer (breast, ovarian, tubal, or peritoneal cancers).  Mammogram.** / Every year beginning at age 17 years and continuing for as long as you are in good health. Consult with your health care provider.  Pap test.** / Every 3 years starting at age 69 years through age 53 or 31 years with 3 consecutive normal Pap tests. Testing can be stopped between 65 and 70 years with 3 consecutive normal Pap tests and no abnormal Pap or HPV tests in the past 10 years.  HPV screening.** / Every 3 years from ages 72 years through ages 36 or 46 years with a history of 3 consecutive normal Pap tests. Testing can be stopped between 65 and 70 years with 3 consecutive normal Pap tests and no abnormal Pap or HPV tests in the past 10 years.  Fecal occult blood test (FOBT) of stool. / Every year beginning at age 91 years and continuing until age 43 years. You may not need to do this test if you get a colonoscopy every 10 years.  Flexible sigmoidoscopy or colonoscopy.** / Every 5 years for a flexible sigmoidoscopy or every 10 years for a colonoscopy beginning at age 53 years and continuing until age 90 years.  Hepatitis C blood test.** / For all people born from [birth year] through 1966 and any individual with known risks for hepatitis C.  Osteoporosis screening.** / A one-time screening for women ages 65 years and over and women at risk for fractures or osteoporosis.  Skin self-exam. /  Monthly.  Influenza vaccine. / Every year.  Tetanus, diphtheria, and acellular pertussis (Tdap/Td) vaccine.** / 1 dose of Td every 10 years.  Varicella vaccine.** / Consult your health care provider.  Zoster vaccine.** / 1 dose for adults aged 61 years or older.  Pneumococcal 13-valent conjugate (PCV13) vaccine.** / Consult your health care provider.  Pneumococcal polysaccharide (PPSV23) vaccine.** / 1 dose for all adults aged 56 years and older.  Meningococcal vaccine.** / Consult your health care provider.  Hepatitis A vaccine.** / Consult your health care provider.  Hepatitis B vaccine.** / Consult your health care provider.  Haemophilus influenzae type b (Hib) vaccine.** / Consult your health care provider. ** Family history and personal history of risk and conditions may change your health care provider's recommendations. Document Released: 09/20/2001 Document Revised: 12/09/2013 Document Reviewed: 12/20/2010 [facility] Patient Information 2015 [place], [place]. This information is not intended to replace advice given to you  by your health care provider. Make sure you discuss any questions you have with your health care provider.  Avascular Necrosis Avascular necrosis is a disease resulting from the temporary or permanent loss of the blood supply to the bones. Without blood, the bone tissue dies and causes the bone to become soft. If the process involves the bone near a joint, it may lead to collapse of the joint surface. This disease is also known as:  Osteonecrosis.  Aseptic necrosis.  Ischemic bone necrosis. Avascular necrosis most commonly affects the ends (epiphysis) of long bones. The femur, the bone extending from the knee joint to the hip joint, is the bone most commonly involved. The disease may affect 1 bone, more than 1 bone at the same time, more than 1 bone at different times. It affects men and women equally. Avascular necrosis occurs at any age. But it is more common  between the ages of 74 and 30 years. SYMPTOMS  In early stages patients may not have any symptoms. But as the disease progresses, joint pain generally develops. At first there is pain when putting weight on the affected joint, and then when resting. Pain usually develops gradually. It may be mild or severe. As the disease progresses and the bone and surrounding joint surface collapses, pain may develop or increase dramatically. Pain may be severe enough to limit range of motion in the affected joint. The period of time between the first symptoms and loss of joint function is different for each patient. This can range from several months to more than a year. Disability depends on:  What part of the bone is affected.  How large an area is involved.  How effectively the bone repairs itself.  If other illnesses are present.  If you are being treated for cancer with medications (chemotherapy).  Radiation.  The cause of the avascular necrosis. DIAGNOSIS  The diagnosis of aseptic necrosis is usually made by:  Taking a history.  Doing an exam.  Taking X-rays. (If X-rays are normal, an MRI may be required.)  Sometimes further blood work and specialized studies may be necessary. TREATMENT  Treatment for this disease is necessary to maintain joint function. If untreated, most patients will suffer severe pain and limitation in movement within 2 years. Several treatments are available that help prevent further bone and joint damage. They can also reduce pain. To determine the most appropriate treatment, the caregiver considers the following aspects of a patient's disease:  The age of the patient.  The stage of the disease (early or late).  The location and amount of bone affected. It may be a small or large area.  The underlying cause of avascular necrosis. The goals in treatment are to:  Improve the patient's use of the affected joint.  Stop further damage to the bone.  Improve bone  and joint survival. Your caregiver may use one or more of the following treatments:  Reduced weight bearing. If avascular necrosis is diagnosed early, the caregiver may begin treatment by having the patient limit weight on the affected joint. The caregiver may recommend limiting activities or using crutches. In some cases, reduced weight bearing can slow the damage caused by the disease and permit natural healing. When combined with medication to reduce pain, reduced weight bearing can be an effective way to avoid or delay surgery for some patients. Most patients eventually will need surgery to reconstruct the joint.  Core decompression. Core decompression works best in people who are in the earliest  stages of avascular necrosis, before the collapse of the joint. This procedure often can reduce pain and slow the progression of bone and joint destruction in these patients. This surgical procedure removes the inner layer of bone, which:  Reduces pressure within the bone.  Increases blood flow to the bone.  Allows more blood vessels to form.  Reduces pain.  Osteotomy. This surgical procedure re-shapes the bone to reduce stress on the affected area of the joint. There is a lengthy recovery period. The patient's activities are very limited for 3 to 12 months after an osteotomy. This procedure is most effective for younger patients with advanced avascular necrosis, and those with a large area of affected bone.  Bone Graft. A bone graft may be used to support a joint after core decompression. Bone grafting is surgery that transplants healthy bone from one part of the patient, such as the leg, to the diseased area. Sometimes the bone is taken with it's blood vessels which are attached to local blood vessels near the area of bone collapse. This is called a vascularized bone graft. There is a lengthy recovery period after a bone graft, usually from 6 to 12 months. This procedure is technically  complex.  Arthroplasty. Arthroplasty is also known as total joint replacement. Total joint replacement is used in late-stage avascular necrosis, and when the joint is deformed. In this surgery, the diseased joint is replaced with artificial parts. It may be recommended for people who are not good candidates for other treatments, such as patients who may not do well with repeated attempts to preserve the joint. Various types of replacements are available, and patients should discuss specific needs with their caregiver. New treatments being tried include:  The use of medications.  Electrical stimulation.  Combination therapies to increase the growth of new bone and blood vessels. Document Released: 01/14/2002 Document Revised: 10/17/2011 Document Reviewed: 10/02/2013 [facility] Patient Information 2015 [place], [place]. This information is not intended to replace advice given to you by your health care provider. Make sure you discuss any questions you have with your health care provider.

## 2015-04-03 NOTE — Progress Notes (Signed)
Pre visit review using our clinic review tool, if applicable. No additional management support is needed unless otherwise documented below in the visit note/SLS  

## 2015-04-03 NOTE — Progress Notes (Signed)
Patient presents to clinic today for annual exam.  Patient is fasting for labs.  Acute Concerns: Patient sill with bilateral hip pain. Was seen in office this past month by another provider. X-ray obtained revealing bilateral avascular necrosis. MRI ordered still waiting on insurance approval. Pain still worse in R hip. Patient has not been taking pain medications as directed. Denies new or worsening symptoms.  Chronic Issues: Hypertension -- Endorses taking medications as directed. Patient denies chest pain, palpitations, lightheadedness, dizziness, vision changes or frequent headaches.  BP Readings from Last 3 Encounters:  04/03/15 134/82  03/26/15 155/82  02/27/15 132/68   GERD -- Endorses well-controlled with Protonix daily.   Depression and Anxiety -- Endorses doing well with Celexa without depressed mood or anhedonia. Is taking Klonopin rarely for anxiety. Has not used in weeks.  Health Maintenance: Dental -- up-to-date per patient. Vision -- up-to-date per patient. Immunizations -- up-to-date. Colonoscopy -- 2014, Dr. Collene Mares. Endorses everything looked good.  Mammogram -- Endorses up-to-date. Followed by [facility] PAP -- Endorses up-to-date. Denies abnormal PAP. Followed by [facility]. Will obtain records.  Past Medical History  Diagnosis Date  . Hypertension   . History of breast cancer   . History of colonic polyps     Dr Collene Mares    Past Surgical History  Procedure Laterality Date  . Dilation and curettage of uterus    . Breast lumpectomy  1997  . Colonoscopy w/ polypectomy  2010    Dr Collene Mares    Current Outpatient Prescriptions on File Prior to Visit  Medication Sig Dispense Refill  . carvedilol (COREG) 25 MG tablet Take 1 tablet (25 mg total) by mouth 2 (two) times daily with a meal. 60 tablet 5  . citalopram (CELEXA) 40 MG tablet Take 1 tablet (40 mg total) by mouth daily. 30 tablet 5  . clonazePAM (KLONOPIN) 1 MG tablet Take 1 mg by mouth  every 8 (eight) hours as needed for anxiety.    . cyclobenzaprine (FLEXERIL) 10 MG tablet Take 1 tablet (10 mg total) by mouth 3 (three) times daily as needed for muscle spasms. 30 tablet 0  . hydrochlorothiazide (HYDRODIURIL) 25 MG tablet TAKE ONE TABLET BY MOUTH ONCE DAILY 60 tablet 5  . HYDROcodone-acetaminophen (NORCO) 5-325 MG per tablet Take 1 tablet by mouth every 6 (six) hours as needed for moderate pain. 45 tablet 0  . losartan (COZAAR) 100 MG tablet Take 1 tablet (100 mg total) by mouth daily. 30 tablet 5  . meloxicam (MOBIC) 15 MG tablet Take 1 tablet (15 mg total) by mouth daily. 30 tablet 1   No current facility-administered medications on file prior to visit.    No Known Allergies  Family History  Problem Relation Age of Onset  . Hypertension Mother   . Diabetes Mother   . Hypertension Sister   . Hypertension Brother     X 3  . Stroke Neg Hx   . Heart disease Neg Hx   . Breast cancer Mother     Social History   Social History  . Marital Status: Single    Spouse Name: N/A  . Number of Children: N/A  . Years of Education: N/A   Occupational History  . Not on file.   Social History Main Topics  . Smoking status: Never Smoker   . Smokeless tobacco: Never Used  . Alcohol Use: No  . Drug Use: No  . Sexual Activity: Not on file   Other Topics  Concern  . Not on file   Social History Narrative    Review of Systems  Constitutional: Negative for fever and weight loss.  HENT: Negative for ear discharge, ear pain, hearing loss and tinnitus.   Eyes: Negative for blurred vision, double vision, photophobia and pain.  Respiratory: Negative for cough and shortness of breath.   Cardiovascular: Negative for chest pain and palpitations.  Gastrointestinal: Negative for heartburn, nausea, vomiting, abdominal pain, diarrhea, constipation, blood in stool and melena.  Genitourinary: Negative for dysuria, urgency, frequency, hematuria and flank pain.  Musculoskeletal:  Positive for back pain and joint pain. Negative for falls.  Neurological: Negative for dizziness, loss of consciousness and headaches.  Endo/Heme/Allergies: Negative for environmental allergies.  Psychiatric/Behavioral: Negative for depression, suicidal ideas, hallucinations and substance abuse. The patient is not nervous/anxious and does not have insomnia.     BP 134/82 mmHg  Pulse 84  Temp(Src) 97.7 F (36.5 C) (Oral)  Resp 16  Ht 5' 6.25" (1.683 m)  Wt 224 lb 4 oz (101.719 kg)  BMI 35.91 kg/m2  SpO2 97%  Physical Exam  Constitutional: She is oriented to person, place, and time and well-developed, well-nourished, and in no distress.  HENT:  Head: Normocephalic and atraumatic.  Right Ear: Tympanic membrane, external ear and ear canal normal.  Left Ear: Tympanic membrane, external ear and ear canal normal.  Nose: Nose normal. No mucosal edema.  Mouth/Throat: Uvula is midline, oropharynx is clear and moist and mucous membranes are normal. No oropharyngeal exudate or posterior oropharyngeal erythema.  Eyes: Conjunctivae are normal. Pupils are equal, round, and reactive to light.  Neck: Neck supple. No thyromegaly present.  Cardiovascular: Normal rate, regular rhythm, normal heart sounds and intact distal pulses.   Pulmonary/Chest: Effort normal and breath sounds normal. No respiratory distress. She has no wheezes. She has no rales.  Abdominal: Soft. Bowel sounds are normal. She exhibits no distension and no mass. There is no tenderness. There is no rebound and no guarding.  Musculoskeletal:       Right hip: She exhibits decreased strength and tenderness. She exhibits normal range of motion and no bony tenderness.       Left hip: She exhibits decreased strength and tenderness. She exhibits normal range of motion and no bony tenderness.  Lymphadenopathy:    She has no cervical adenopathy.  Neurological: She is alert and oriented to person, place, and time. No cranial nerve deficit.    Skin: Skin is warm and dry. No rash noted.  Psychiatric: Affect normal.  Vitals reviewed.   Recent Results (from the past 2160 hour(s))  Comp Met (CMET)     Status: Abnormal   Collection Time: 02/27/15  9:52 AM  Result Value Ref Range   Sodium 139 135 - 145 mEq/L   Potassium 3.4 (L) 3.5 - 5.1 mEq/L   Chloride 100 96 - 112 mEq/L   CO2 33 (H) 19 - 32 mEq/L   Glucose, Bld 107 (H) 70 - 99 mg/dL   BUN 20 6 - 23 mg/dL   Creatinine, Ser 1.00 0.40 - 1.20 mg/dL   Total Bilirubin 0.4 0.2 - 1.2 mg/dL   Alkaline Phosphatase 105 39 - 117 U/L   AST 14 0 - 37 U/L   ALT 13 0 - 35 U/L   Total Protein 7.7 6.0 - 8.3 g/dL   Albumin 4.0 3.5 - 5.2 g/dL   Calcium 10.2 8.4 - 10.5 mg/dL   GFR 74.02 >60.00 mL/min  CBC  Status: None   Collection Time: 04/03/15  9:17 AM  Result Value Ref Range   WBC 5.2 4.0 - 10.5 K/uL   RBC 4.21 3.87 - 5.11 Mil/uL   Platelets 290.0 150.0 - 400.0 K/uL   Hemoglobin 12.3 12.0 - 15.0 g/dL   HCT 36.7 36.0 - 46.0 %   MCV 87.1 78.0 - 100.0 fl   MCHC 33.4 30.0 - 36.0 g/dL   RDW 14.1 11.5 - 56.8 %  Basic Metabolic Panel (BMET)     Status: None   Collection Time: 04/03/15  9:17 AM  Result Value Ref Range   Sodium 138 135 - 145 mEq/L   Potassium 3.6 3.5 - 5.1 mEq/L   Chloride 100 96 - 112 mEq/L   CO2 29 19 - 32 mEq/L   Glucose, Bld 98 70 - 99 mg/dL   BUN 13 6 - 23 mg/dL   Creatinine, Ser 0.88 0.40 - 1.20 mg/dL   Calcium 10.2 8.4 - 10.5 mg/dL   GFR 85.76 >60.00 mL/min  Hemoglobin A1c     Status: None   Collection Time: 04/03/15  9:17 AM  Result Value Ref Range   Hgb A1c MFr Bld 6.5 4.6 - 6.5 %    Comment: Glycemic Control Guidelines for People with Diabetes:Non Diabetic:  <6%Goal of Therapy: <7%Additional Action Suggested:  >8%   TSH     Status: None   Collection Time: 04/03/15  9:17 AM  Result Value Ref Range   TSH 1.16 0.35 - 4.50 uIU/mL  Lipid Profile     Status: Abnormal   Collection Time: 04/03/15  9:17 AM  Result Value Ref Range   Cholesterol 156 0 -  200 mg/dL    Comment: ATP III Classification       Desirable:  < 200 mg/dL               Borderline High:  200 - 239 mg/dL          High:  > = 240 mg/dL   Triglycerides 78.0 0.0 - 149.0 mg/dL    Comment: Normal:  <150 mg/dLBorderline High:  150 - 199 mg/dL   HDL 40.50 >39.00 mg/dL   VLDL 15.6 0.0 - 40.0 mg/dL   LDL Cholesterol 100 (H) 0 - 99 mg/dL   Total CHOL/HDL Ratio 4     Comment:                Men          Women1/2 Average Risk     3.4          3.3Average Risk          5.0          4.42X Average Risk          9.6          7.13X Average Risk          15.0          11.0                       NonHDL 115.11     Comment: NOTE:  Non-HDL goal should be 30 mg/dL higher than patient's LDL goal (i.e. LDL goal of < 70 mg/dL, would have non-HDL goal of < 100 mg/dL)  Hepatitis C Antibody     Status: None   Collection Time: 04/03/15  9:17 AM  Result Value Ref Range   HCV Ab NEGATIVE NEGATIVE    Assessment/Plan: Avascular  necrosis of bones of both hips Ortho referral placed. MRI certification in process. Will hope to obtain MRI this Saturday here at the [facility]. Norco as directed with Mobic once daily. Im Toradol given in office by CMA for pain relief with good result.  Essential hypertension Well-controlled. Asymptomatic. Medications refilled. Continue current regimen.  GERD Well-controlled. Continue current regimen.  Visit for preventive health examination Depression screen negative. Health Maintenance reviewed -- Upcoming mammogram at GYN. Other's up-to-date. Will proceed with one-time Hep C screening. HIV screening declined by patient. Preventive schedule discussed and handout given in AVS. Will obtain fasting labs today.

## 2015-04-03 NOTE — Telephone Encounter (Signed)
Prior authorization for pantoprazole initiated. Awaiting determination. JG//CMA

## 2015-04-05 DIAGNOSIS — M87052 Idiopathic aseptic necrosis of left femur: Secondary | ICD-10-CM

## 2015-04-05 DIAGNOSIS — M87051 Idiopathic aseptic necrosis of right femur: Secondary | ICD-10-CM | POA: Insufficient documentation

## 2015-04-05 NOTE — Assessment & Plan Note (Signed)
Ortho referral placed. MRI certification in process. Will hope to obtain MRI this Saturday here at the [facility]. Norco as directed with Mobic once daily. Im Toradol given in office by CMA for pain relief with good result.

## 2015-04-05 NOTE — Assessment & Plan Note (Signed)
Well-controlled. Asymptomatic. Medications refilled. Continue current regimen.

## 2015-04-05 NOTE — Assessment & Plan Note (Signed)
Well-controlled.  Continue current regimen. 

## 2015-04-05 NOTE — Assessment & Plan Note (Signed)
Depression screen negative. Health Maintenance reviewed -- Upcoming mammogram at GYN. Other's up-to-date. Will proceed with one-time Hep C screening. HIV screening declined by patient. Preventive schedule discussed and handout given in AVS. Will obtain fasting labs today.

## 2015-04-07 NOTE — Telephone Encounter (Signed)
PA approved until 04/02/2016. File ID: [id number]. Approval letter sent for scanning. JG//CMA

## 2015-04-10 ENCOUNTER — Telehealth: Payer: Self-pay | Admitting: *Deleted

## 2015-04-10 NOTE — Telephone Encounter (Signed)
Received disability attending physician's statement via fax from [place]. Filled out as much as possible and forwarded to [place]. JG//CMA

## 2015-04-15 NOTE — Telephone Encounter (Signed)
Faxed successfully to [facility]. Sent for scanning. JG//CMA

## 2015-04-18 ENCOUNTER — Ambulatory Visit (HOSPITAL_BASED_OUTPATIENT_CLINIC_OR_DEPARTMENT_OTHER)
Admission: RE | Admit: 2015-04-18 | Discharge: 2015-04-18 | Disposition: A | Discharge status: Home / Self Care | Payer: 59 | Source: Ambulatory Visit | Attending: Medical | Admitting: Medical

## 2015-04-18 DIAGNOSIS — M160 Bilateral primary osteoarthritis of hip: Secondary | ICD-10-CM | POA: Diagnosis not present

## 2015-04-18 DIAGNOSIS — M25551 Pain in right hip: Secondary | ICD-10-CM | POA: Insufficient documentation

## 2015-04-18 DIAGNOSIS — M25552 Pain in left hip: Secondary | ICD-10-CM | POA: Insufficient documentation

## 2015-04-18 DIAGNOSIS — M879 Osteonecrosis, unspecified: Secondary | ICD-10-CM | POA: Insufficient documentation

## 2015-04-20 ENCOUNTER — Telehealth: Payer: Self-pay | Admitting: Medical

## 2015-04-20 DIAGNOSIS — M87059 Idiopathic aseptic necrosis of unspecified femur: Secondary | ICD-10-CM

## 2015-04-20 NOTE — Telephone Encounter (Signed)
Referl to ortho for avascular necrosis of both hips.

## 2015-04-28 ENCOUNTER — Telehealth: Payer: Self-pay | Admitting: Physician Assistant

## 2015-04-28 NOTE — Telephone Encounter (Signed)
Relation to pt: self  Call back number:[phone]   Reason for call:  Bebe Liter faxed over forms 04/27/15 regarding Dx code of why patient is out on short term disability. Patient states the deadline is 04/29/15. Bebe Liter # [phone] and fax # [phone]

## 2015-04-29 NOTE — Telephone Encounter (Signed)
Can we please call at 8 AM when they open to assess everything needed has been received?

## 2015-04-29 NOTE — Telephone Encounter (Signed)
Dx code faxed on 04/28/2015

## 2015-04-29 NOTE — Telephone Encounter (Signed)
Pt calling now to f/u on this. She said Ruby Cola at [facility] (Ph # [phone] and fax # [phone]) is handling her claim and needing the additional info. Case # [id number]  Please call her after talking to [facility], her # [phone].

## 2015-04-29 NOTE — Telephone Encounter (Signed)
All forms and notes, labs & Imaging from 03/26/15 & 08.26.16 visits have been faxed to [facility] w/[place] at [phone] - Total 37 pages; spoke with representative Delana Meyer at [facility] prior to fax/SLS Patient informed, understood & agreed/SLS

## 2015-04-30 NOTE — Telephone Encounter (Signed)
Recd call from [facility], they have everything they need "for the moment"

## 2015-05-05 NOTE — Telephone Encounter (Signed)
Error

## 2015-05-06 ENCOUNTER — Encounter (HOSPITAL_COMMUNITY)
Admission: RE | Admit: 2015-05-06 | Discharge: 2015-05-06 | Disposition: A | Discharge status: Home / Self Care | Payer: 59 | Source: Ambulatory Visit | Attending: Orthopedic Surgery | Admitting: Orthopedic Surgery

## 2015-05-06 ENCOUNTER — Encounter (HOSPITAL_COMMUNITY): Payer: Self-pay

## 2015-05-06 DIAGNOSIS — M87051 Idiopathic aseptic necrosis of right femur: Secondary | ICD-10-CM | POA: Diagnosis not present

## 2015-05-06 DIAGNOSIS — M87052 Idiopathic aseptic necrosis of left femur: Secondary | ICD-10-CM | POA: Insufficient documentation

## 2015-05-06 DIAGNOSIS — Z01818 Encounter for other preprocedural examination: Secondary | ICD-10-CM | POA: Diagnosis not present

## 2015-05-06 HISTORY — DX: Gastro-esophageal reflux disease without esophagitis: K21.9

## 2015-05-06 HISTORY — DX: Other intervertebral disc displacement, lumbar region: M51.26

## 2015-05-06 HISTORY — DX: Unspecified osteoarthritis, unspecified site: M19.90

## 2015-05-06 HISTORY — DX: Restless legs syndrome: G25.81

## 2015-05-06 HISTORY — DX: Other intervertebral disc degeneration, lumbar region without mention of lumbar back pain or lower extremity pain: M51.369

## 2015-05-06 HISTORY — DX: Anxiety disorder, unspecified: F41.9

## 2015-05-06 HISTORY — DX: Other intervertebral disc degeneration, lumbar region: M51.36

## 2015-05-06 LAB — CBC
HEMATOCRIT: 37.9 % (ref 36.0–46.0)
Hemoglobin: 12.5 g/dL (ref 12.0–15.0)
MCH: 28.8 pg (ref 26.0–34.0)
MCHC: 33 g/dL (ref 30.0–36.0)
MCV: 87.3 fL (ref 78.0–100.0)
Platelets: 279 10*3/uL (ref 150–400)
RBC: 4.34 MIL/uL (ref 3.87–5.11)
RDW: 13.5 % (ref 11.5–15.5)
WBC: 6 10*3/uL (ref 4.0–10.5)

## 2015-05-06 LAB — BASIC METABOLIC PANEL
Anion gap: 8 (ref 5–15)
BUN: 17 mg/dL (ref 6–20)
CHLORIDE: 102 mmol/L (ref 101–111)
CO2: 30 mmol/L (ref 22–32)
Calcium: 10 mg/dL (ref 8.9–10.3)
Creatinine, Ser: 1.2 mg/dL — ABNORMAL HIGH (ref 0.44–1.00)
GFR calc Af Amer: 58 mL/min — ABNORMAL LOW (ref >60)
GFR calc non Af Amer: 50 mL/min — ABNORMAL LOW (ref >60)
Glucose, Bld: 98 mg/dL (ref 65–99)
POTASSIUM: 3.8 mmol/L (ref 3.5–5.1)
SODIUM: 140 mmol/L (ref 135–145)

## 2015-05-06 LAB — URINALYSIS, ROUTINE W REFLEX MICROSCOPIC
BILIRUBIN URINE: NEGATIVE
Glucose, UA: NEGATIVE mg/dL
HGB URINE DIPSTICK: NEGATIVE
Ketones, ur: NEGATIVE mg/dL
Leukocytes, UA: NEGATIVE
Nitrite: NEGATIVE
PROTEIN: NEGATIVE mg/dL
Specific Gravity, Urine: 1.021 (ref 1.005–1.030)
UROBILINOGEN UA: 1 mg/dL (ref 0.0–1.0)
pH: 6.5 (ref 5.0–8.0)

## 2015-05-06 LAB — PROTIME-INR
INR: 1.04 (ref 0.00–1.49)
Prothrombin Time: 13.8 seconds (ref 11.6–15.2)

## 2015-05-06 LAB — APTT: aPTT: 34 seconds (ref 24–37)

## 2015-05-06 LAB — ABO/RH: ABO/RH(D): O NEG

## 2015-05-06 LAB — SURGICAL PCR SCREEN
MRSA, PCR: NEGATIVE
Staphylococcus aureus: POSITIVE — AB

## 2015-05-06 NOTE — Patient Instructions (Addendum)
Madeline Chandler  05/06/2015   Your procedure is scheduled on: Tuesday May 12, 2015   Report to [facility] Main  Entrance take [place]  elevators to 3rd floor to  [place] at 12:45 PM.  Call this number if you have problems the morning of surgery [phone]   Remember: ONLY 1 PERSON MAY GO WITH YOU TO SHORT STAY TO GET  READY MORNING OF [place].  Do not eat food After Midnight but may take clear liquids till 9:45 am day of surgery then nothing by mouth.      Take these medicines the morning of surgery with A SIP OF WATER: Carvedilol (Coreg); Hydrocodone-Acetaminophen if needed                               You may not have any metal on your body including hair pins and              piercings  Do not wear jewelry, make-up, lotions, powders or perfumes, deodorant             Do not wear nail polish.  Do not shave  48 hours prior to surgery.                Do not bring valuables to the hospital. [place].  Contacts, dentures or bridgework may not be worn into surgery.  Leave suitcase in the car. After surgery it may be brought to your room.                Please read over the following fact sheets you were given:MRSA INFORMATION SHEET; INCENTIVE SPIROMETER; BLOOD TRANSFUSION INFORMATION SHEET _____________________________________________________________________             [facility] - Preparing for Surgery Before surgery, you can play an important role.  Because skin is not sterile, your skin needs to be as free of germs as possible.  You can reduce the number of germs on your skin by washing with CHG (chlorahexidine gluconate) soap before surgery.  CHG is an antiseptic cleaner which kills germs and bonds with the skin to continue killing germs even after washing. Please DO NOT use if you have an allergy to CHG or antibacterial soaps.  If your skin becomes reddened/irritated stop using the CHG and  inform your nurse when you arrive at Short Stay. Do not shave (including legs and underarms) for at least 48 hours prior to the first CHG shower.  You may shave your face/neck. Please follow these instructions carefully:  1.  Shower with CHG Soap the night before surgery and the  morning of Surgery.  2.  If you choose to wash your hair, wash your hair first as usual with your  normal  shampoo.  3.  After you shampoo, rinse your hair and body thoroughly to remove the  shampoo.                           4.  Use CHG as you would any other liquid soap.  You can apply chg directly  to the skin and wash  Gently with a scrungie or clean washcloth.  5.  Apply the CHG Soap to your body ONLY FROM THE NECK DOWN.   Do not use on face/ open                           Wound or open sores. Avoid contact with eyes, ears mouth and genitals (private parts).                       Wash face,  Genitals (private parts) with your normal soap.             6.  Wash thoroughly, paying special attention to the area where your surgery  will be performed.  7.  Thoroughly rinse your body with warm water from the neck down.  8.  DO NOT shower/wash with your normal soap after using and rinsing off  the CHG Soap.                9.  Pat yourself dry with a clean towel.            10.  Wear clean pajamas.            11.  Place clean sheets on your bed the night of your first shower and do not  sleep with pets. Day of Surgery : Do not apply any lotions/deodorants the morning of surgery.  Please wear clean clothes to the hospital/surgery center.  FAILURE TO FOLLOW THESE INSTRUCTIONS MAY RESULT IN THE CANCELLATION OF YOUR SURGERY PATIENT SIGNATURE_________________________________  NURSE SIGNATURE__________________________________  ________________________________________________________________________    CLEAR LIQUID DIET   Foods Allowed                                                                      Foods Excluded  Coffee and tea, regular and decaf                             liquids that you cannot  Plain Jell-O in any flavor                                             see through such as: Fruit ices (not with fruit pulp)                                     milk, soups, orange juice  Iced Popsicles                                    All solid food Carbonated beverages, regular and diet                                    Cranberry, grape and apple juices Sports drinks like Gatorade Lightly seasoned clear broth or consume(fat free) Sugar, honey syrup  Sample Menu Breakfast                                Lunch                                     Supper Cranberry juice                    Beef broth                            Chicken broth Jell-O                                     Grape juice                           Apple juice Coffee or tea                        Jell-O                                      Popsicle                                                Coffee or tea                        Coffee or tea  _____________________________________________________________________    Incentive Spirometer  An incentive spirometer is a tool that can help keep your lungs clear and active. This tool measures how well you are filling your lungs with each breath. Taking long deep breaths may help reverse or decrease the chance of developing breathing (pulmonary) problems (especially infection) following:  A long period of time when you are unable to move or be active. BEFORE THE PROCEDURE   If the spirometer includes an indicator to show your best effort, your nurse or respiratory therapist will set it to a desired goal.  If possible, sit up straight or lean slightly forward. Try not to slouch.  Hold the incentive spirometer in an upright position. INSTRUCTIONS FOR USE   Sit on the edge of your bed if possible, or sit up as far as you can in bed or on a chair.  Hold the  incentive spirometer in an upright position.  Breathe out normally.  Place the mouthpiece in your mouth and seal your lips tightly around it.  Breathe in slowly and as deeply as possible, raising the piston or the ball toward the top of the column.  Hold your breath for 3-5 seconds or for as long as possible. Allow the piston or ball to fall to the bottom of the column.  Remove the mouthpiece from your mouth and breathe out normally.  Rest for a few seconds and repeat Steps 1 through 7 at least 10 times every 1-2 hours when you are awake. Take your time and take a few normal breaths between  deep breaths.  The spirometer may include an indicator to show your best effort. Use the indicator as a goal to work toward during each repetition.  After each set of 10 deep breaths, practice coughing to be sure your lungs are clear. If you have an incision (the cut made at the time of surgery), support your incision when coughing by placing a pillow or rolled up towels firmly against it. Once you are able to get out of bed, walk around indoors and cough well. You may stop using the incentive spirometer when instructed by your caregiver.  RISKS AND COMPLICATIONS  Take your time so you do not get dizzy or light-headed.  If you are in pain, you may need to take or ask for pain medication before doing incentive spirometry. It is harder to take a deep breath if you are having pain. AFTER USE  Rest and breathe slowly and easily.  It can be helpful to keep track of a log of your progress. Your caregiver can provide you with a simple table to help with this. If you are using the spirometer at home, follow these instructions: [place] IF:   You are having difficultly using the spirometer.  You have trouble using the spirometer as often as instructed.  Your pain medication is not giving enough relief while using the spirometer.  You develop fever of 100.5 F (38.1 C) or higher. SEEK  IMMEDIATE MEDICAL CARE IF:   You cough up bloody sputum that had not been present before.  You develop fever of 102 F (38.9 C) or greater.  You develop worsening pain at or near the incision site. MAKE SURE YOU:   Understand these instructions.  Will watch your condition.  Will get help right away if you are not doing well or get worse. Document Released: 12/05/2006 Document Revised: 10/17/2011 Document Reviewed: 02/05/2007 ExitCare Patient Information 2014 ExitCare, [place].   ________________________________________________________________________  WHAT IS A BLOOD TRANSFUSION? Blood Transfusion Information  A transfusion is the replacement of blood or some of its parts. Blood is made up of multiple cells which provide different functions.  Red blood cells carry oxygen and are used for blood loss replacement.  White blood cells fight against infection.  Platelets control bleeding.  Plasma helps clot blood.  Other blood products are available for specialized needs, such as hemophilia or other clotting disorders. BEFORE THE TRANSFUSION  Who gives blood for transfusions?   Healthy volunteers who are fully evaluated to make sure their blood is safe. This is blood bank blood. Transfusion therapy is the safest it has ever been in the practice of medicine. Before blood is taken from a donor, a complete history is taken to make sure that person has no history of diseases nor engages in risky social behavior (examples are intravenous drug use or sexual activity with multiple partners). The donor's travel history is screened to minimize risk of transmitting infections, such as malaria. The donated blood is tested for signs of infectious diseases, such as HIV and hepatitis. The blood is then tested to be sure it is compatible with you in order to minimize the chance of a transfusion reaction. If you or a relative donates blood, this is often done in anticipation of surgery and is not  appropriate for emergency situations. It takes many days to process the donated blood. RISKS AND COMPLICATIONS Although transfusion therapy is very safe and saves many lives, the main dangers of transfusion include:   Getting an infectious disease.  Developing a transfusion reaction. This is an allergic reaction to something in the blood you were given. Every precaution is taken to prevent this. The decision to have a blood transfusion has been considered carefully by your caregiver before blood is given. Blood is not given unless the benefits outweigh the risks. AFTER THE TRANSFUSION  Right after receiving a blood transfusion, you will usually feel much better and more energetic. This is especially true if your red blood cells have gotten low (anemic). The transfusion raises the level of the red blood cells which carry oxygen, and this usually causes an energy increase.  The nurse administering the transfusion will monitor you carefully for complications. HOME CARE INSTRUCTIONS  No special instructions are needed after a transfusion. You may find your energy is better. Speak with your caregiver about any limitations on activity for underlying diseases you may have. SEEK MEDICAL CARE IF:   Your condition is not improving after your transfusion.  You develop redness or irritation at the intravenous (IV) site. SEEK IMMEDIATE MEDICAL CARE IF:  Any of the following symptoms occur over the next 12 hours:  Shaking chills.  You have a temperature by mouth above 102 F (38.9 C), not controlled by medicine.  Chest, back, or muscle pain.  People around you feel you are not acting correctly or are confused.  Shortness of breath or difficulty breathing.  Dizziness and fainting.  You get a rash or develop hives.  You have a decrease in urine output.  Your urine turns a dark color or changes to pink, red, or brown. Any of the following symptoms occur over the next 10 days:  You have a  temperature by mouth above 102 F (38.9 C), not controlled by medicine.  Shortness of breath.  Weakness after normal activity.  The white part of the eye turns yellow (jaundice).  You have a decrease in the amount of urine or are urinating less often.  Your urine turns a dark color or changes to pink, red, or brown. Document Released: 07/22/2000 Document Revised: 10/17/2011 Document Reviewed: 03/10/2008 [facility] Patient Information 2014 [place], [place].  _______________________________________________________________________

## 2015-05-07 ENCOUNTER — Encounter (HOSPITAL_COMMUNITY): Payer: Self-pay

## 2015-05-07 NOTE — Progress Notes (Signed)
BMP results / epic per PAT visit 05/06/2015 sent to Dr Alvan Dame

## 2015-05-07 NOTE — Progress Notes (Signed)
Surgical screening results/epic per PAT visit 05/06/2015 positive for STAPH. Results sent to Dr Alvan Dame. Prescription for Mupriocin Ointment called to [place]; spoke with Gwen/pharmacist. Pt aware.

## 2015-05-07 NOTE — Progress Notes (Signed)
Your patient has screened at an elevated risk for Obstructive Sleep Apnea using the Stop-Bang Tool during a pre-surgical vist. Patient scored at high risk.

## 2015-05-10 NOTE — H&P (Signed)
TOTAL HIP ADMISSION H&P  Patient is admitted for right total hip arthroplasty, anterior approach.  Subjective:  Chief Complaint: Right hip AVN / pain  HPI: Madeline Chandler, 54 y.o. female, has a history of pain and functional disability in the right hip(s) due to arthritis and AVN and patient has failed non-surgical conservative treatments for greater than 12 weeks to include NSAID's and/or analgesics, use of assistive devices and activity modification.  Onset of symptoms was gradual starting 6 months ago with rapidlly worsening course since that time.The patient noted no past surgery on the bilaterally hip(s).  Patient currently rates pain in the right hip at 10 out of 10 with activity. Patient has night pain, worsening of pain with activity and weight bearing, trendelenberg gait, pain that interfers with activities of daily living and pain with passive range of motion. Patient has evidence of periarticular osteophytes, joint space narrowing and AVN by MRI by imaging studies. This condition presents safety issues increasing the risk of falls.   There is no current active infection.  Risks, benefits and expectations were discussed with the patient.  Risks including but not limited to the risk of anesthesia, blood clots, nerve damage, blood vessel damage, failure of the prosthesis, infection and up to and including death.  Patient understand the risks, benefits and expectations and wishes to proceed with surgery.   PCP: Leeanne Rio, PA-C  D/C Plans:      Home with HHPT  Post-op Meds:       No Rx given  Tranexamic Acid:      To be given - IV  Decadron:      Is to be given  FYI:     ASA post-op  Norco post-op    Patient Active Problem List   Diagnosis Date Noted  . Avascular necrosis of bones of both hips ([place]) 04/05/2015  . Essential hypertension 02/27/2015  . Muscle spasm 02/27/2015  . Pain in joint, shoulder region 09/05/2014  . Visit for preventive health examination  12/12/2013  . Leg swelling 12/08/2013  . Other abnormal glucose 02/23/2012  . Nonspecific abnormal electrocardiogram (ECG) (EKG) 02/23/2012  . GERD 10/23/2009  . ANXIETY STATE, UNSPECIFIED 09/02/2009  . DEPRESSION 07/10/2008  . BREAST CANCER, HX OF 07/10/2008  . COLONIC POLYPS, HX OF 07/10/2008  . FIBROIDS, UTERUS 02/15/2008  . MIGRAINE HEADACHE 02/15/2008  . DILATION AND CURETTAGE, HX OF 02/15/2008  . Unspecified essential hypertension 09/25/2007   Past Medical History  Diagnosis Date  . Hypertension   . History of breast cancer     right   . History of colonic polyps     Dr Collene Mares  . History of chemotherapy   . History of radiation therapy   . History of bronchitis   . Anxiety   . GERD (gastroesophageal reflux disease)   . Arthritis   . Cancer     breast cancer- right   . Bulging lumbar disc     2  . Restless leg syndrome     Past Surgical History  Procedure Laterality Date  . Dilation and curettage of uterus    . Breast lumpectomy  1997  . Colonoscopy w/ polypectomy  2010    Dr Collene Mares  . Abdominal hysterectomy      No prescriptions prior to admission   No Known Allergies   Social History  Substance Use Topics  . Smoking status: Never Smoker   . Smokeless tobacco: Never Used  . Alcohol Use: No  Family History  Problem Relation Age of Onset  . Hypertension Mother   . Diabetes Mother   . Hypertension Sister   . Hypertension Brother     X 3  . Stroke Neg Hx   . Heart disease Neg Hx   . Breast cancer Mother      Review of Systems  Constitutional: Negative.   Eyes: Negative.   Respiratory: Negative.   Cardiovascular: Negative.   Gastrointestinal: Positive for heartburn.  Genitourinary: Negative.   Musculoskeletal: Positive for back pain and joint pain.  Skin: Negative.   Neurological: Positive for headaches.  Endo/Heme/Allergies: Negative.   Psychiatric/Behavioral: Positive for depression. The patient is nervous/anxious.      Objective:  Physical Exam  Constitutional: She is oriented to person, place, and time. She appears well-developed and well-nourished.  HENT:  Head: Normocephalic and atraumatic.  Eyes: Pupils are equal, round, and reactive to light.  Neck: Neck supple. No JVD present. No tracheal deviation present. No thyromegaly present.  Cardiovascular: Normal rate, regular rhythm, normal heart sounds and intact distal pulses.   Respiratory: Effort normal and breath sounds normal. No stridor. No respiratory distress. She has no wheezes.  GI: Soft. There is no tenderness. There is no guarding.  Musculoskeletal:       Right hip: She exhibits decreased range of motion, decreased strength, tenderness and bony tenderness. She exhibits no swelling, no deformity and no laceration.  Lymphadenopathy:    She has no cervical adenopathy.  Neurological: She is alert and oriented to person, place, and time.  Skin: Skin is warm and dry.  Psychiatric: She has a normal mood and affect.      Labs:  Estimated body mass index is 35.91 kg/(m^2) as calculated from the following:   Height as of 04/03/15: 5' 6.25" (1.683 m).   Weight as of 04/03/15: 101.719 kg (224 lb 4 oz).   Imaging Review Plain radiographs demonstrate severe degenerative joint disease of the bilateral hip(s). The bone quality appears to be good for age and reported activity level.  Assessment/Plan:  End stage arthritis / AVN, bilaterally hip(s)  The patient history, physical examination, clinical judgement of the provider and imaging studies are consistent with end stage degenerative joint disease of the right hip(s) and total hip arthroplasty is deemed medically necessary. The treatment options including medical management, injection therapy, arthroscopy and arthroplasty were discussed at length. The risks and benefits of total hip arthroplasty were presented and reviewed. The risks due to aseptic loosening, infection, stiffness,  dislocation/subluxation,  thromboembolic complications and other imponderables were discussed.  The patient acknowledged the explanation, agreed to proceed with the plan and consent was signed. Patient is being admitted for inpatient treatment for surgery, pain control, PT, OT, prophylactic antibiotics, VTE prophylaxis, progressive ambulation and ADL's and discharge planning.The patient is planning to be discharged home with home health services.     [place] Kerrigan Gombos   PA-C  05/10/2015, 10:37 PM

## 2015-05-12 ENCOUNTER — Inpatient Hospital Stay (HOSPITAL_COMMUNITY): Payer: 59

## 2015-05-12 ENCOUNTER — Encounter (HOSPITAL_COMMUNITY): Payer: Self-pay | Admitting: *Deleted

## 2015-05-12 ENCOUNTER — Encounter (HOSPITAL_COMMUNITY)
Admission: RE | Disposition: A | Discharge status: Home Health | Payer: Self-pay | Source: Ambulatory Visit | Attending: Orthopedic Surgery

## 2015-05-12 ENCOUNTER — Inpatient Hospital Stay (HOSPITAL_COMMUNITY): Payer: 59 | Admitting: Anesthesiology

## 2015-05-12 ENCOUNTER — Inpatient Hospital Stay (HOSPITAL_COMMUNITY)
Admission: RE | Admit: 2015-05-12 | Discharge: 2015-05-14 | DRG: 470 | Disposition: A | Discharge status: Home Health | Payer: 59 | Source: Ambulatory Visit | Attending: Orthopedic Surgery | Admitting: Orthopedic Surgery

## 2015-05-12 DIAGNOSIS — G2581 Restless legs syndrome: Secondary | ICD-10-CM | POA: Diagnosis present

## 2015-05-12 DIAGNOSIS — Z01812 Encounter for preprocedural laboratory examination: Secondary | ICD-10-CM | POA: Diagnosis not present

## 2015-05-12 DIAGNOSIS — M25551 Pain in right hip: Secondary | ICD-10-CM | POA: Diagnosis present

## 2015-05-12 DIAGNOSIS — Z923 Personal history of irradiation: Secondary | ICD-10-CM

## 2015-05-12 DIAGNOSIS — I10 Essential (primary) hypertension: Secondary | ICD-10-CM | POA: Diagnosis present

## 2015-05-12 DIAGNOSIS — E669 Obesity, unspecified: Secondary | ICD-10-CM | POA: Diagnosis present

## 2015-05-12 DIAGNOSIS — Z6835 Body mass index (BMI) 35.0-35.9, adult: Secondary | ICD-10-CM

## 2015-05-12 DIAGNOSIS — M87051 Idiopathic aseptic necrosis of right femur: Principal | ICD-10-CM | POA: Diagnosis present

## 2015-05-12 DIAGNOSIS — K219 Gastro-esophageal reflux disease without esophagitis: Secondary | ICD-10-CM | POA: Diagnosis present

## 2015-05-12 DIAGNOSIS — Z96649 Presence of unspecified artificial hip joint: Secondary | ICD-10-CM

## 2015-05-12 DIAGNOSIS — F419 Anxiety disorder, unspecified: Secondary | ICD-10-CM | POA: Diagnosis present

## 2015-05-12 DIAGNOSIS — Z853 Personal history of malignant neoplasm of breast: Secondary | ICD-10-CM

## 2015-05-12 DIAGNOSIS — M62838 Other muscle spasm: Secondary | ICD-10-CM

## 2015-05-12 HISTORY — PX: TOTAL HIP ARTHROPLASTY: SHX124

## 2015-05-12 LAB — TYPE AND SCREEN
ABO/RH(D): O NEG
ANTIBODY SCREEN: NEGATIVE

## 2015-05-12 SURGERY — ARTHROPLASTY, HIP, TOTAL, ANTERIOR APPROACH
Anesthesia: Spinal | Site: Hip | Laterality: Right

## 2015-05-12 MED ORDER — PROPOFOL 10 MG/ML IV BOLUS
INTRAVENOUS | Status: DC | PRN
  Administered 2015-05-12: 30 mg via INTRAVENOUS

## 2015-05-12 MED ORDER — DEXAMETHASONE SODIUM PHOSPHATE 10 MG/ML IJ SOLN
10.0000 mg | Freq: Once | INTRAMUSCULAR | Status: AC
  Administered 2015-05-13: 10 mg via INTRAVENOUS
  Filled 2015-05-12: qty 1

## 2015-05-12 MED ORDER — LACTATED RINGERS IV SOLN
INTRAVENOUS | Status: DC
  Administered 2015-05-12: 1000 mL via INTRAVENOUS

## 2015-05-12 MED ORDER — HYDROMORPHONE HCL 1 MG/ML IJ SOLN: 0.2500 mg | INTRAMUSCULAR | Status: DC | PRN

## 2015-05-12 MED ORDER — CELECOXIB 200 MG PO CAPS
200.0000 mg | ORAL_CAPSULE | Freq: Two times a day (BID) | ORAL | Status: DC
  Administered 2015-05-12 – 2015-05-14 (×4): 200 mg via ORAL
  Filled 2015-05-12 (×6): qty 1

## 2015-05-12 MED ORDER — DEXAMETHASONE SODIUM PHOSPHATE 10 MG/ML IJ SOLN
INTRAMUSCULAR | Status: AC
  Filled 2015-05-12: qty 1

## 2015-05-12 MED ORDER — BISACODYL 10 MG RE SUPP: 10.0000 mg | Freq: Every day | RECTAL | Status: DC | PRN

## 2015-05-12 MED ORDER — ONDANSETRON HCL 4 MG/2ML IJ SOLN
INTRAMUSCULAR | Status: AC
  Filled 2015-05-12: qty 2

## 2015-05-12 MED ORDER — METHOCARBAMOL 1000 MG/10ML IJ SOLN
500.0000 mg | Freq: Four times a day (QID) | INTRAVENOUS | Status: DC | PRN
  Filled 2015-05-12: qty 5

## 2015-05-12 MED ORDER — METHOCARBAMOL 500 MG PO TABS
500.0000 mg | ORAL_TABLET | Freq: Four times a day (QID) | ORAL | Status: DC | PRN
  Administered 2015-05-12 – 2015-05-14 (×3): 500 mg via ORAL
  Filled 2015-05-12 (×3): qty 1

## 2015-05-12 MED ORDER — FENTANYL CITRATE (PF) 100 MCG/2ML IJ SOLN
INTRAMUSCULAR | Status: DC | PRN
  Administered 2015-05-12: 100 ug via INTRAVENOUS

## 2015-05-12 MED ORDER — LACTATED RINGERS IV SOLN
INTRAVENOUS | Status: DC | PRN
  Administered 2015-05-12 (×2): via INTRAVENOUS

## 2015-05-12 MED ORDER — MIDAZOLAM HCL 5 MG/5ML IJ SOLN
INTRAMUSCULAR | Status: DC | PRN
  Administered 2015-05-12: 2 mg via INTRAVENOUS

## 2015-05-12 MED ORDER — CEFAZOLIN SODIUM-DEXTROSE 2-3 GM-% IV SOLR
2.0000 g | INTRAVENOUS | Status: AC
  Administered 2015-05-12: 2 g via INTRAVENOUS

## 2015-05-12 MED ORDER — CEFAZOLIN SODIUM-DEXTROSE 2-3 GM-% IV SOLR
INTRAVENOUS | Status: AC
  Filled 2015-05-12: qty 50

## 2015-05-12 MED ORDER — METOCLOPRAMIDE HCL 5 MG/ML IJ SOLN: 5.0000 mg | Freq: Three times a day (TID) | INTRAMUSCULAR | Status: DC | PRN

## 2015-05-12 MED ORDER — CITALOPRAM HYDROBROMIDE 40 MG PO TABS
40.0000 mg | ORAL_TABLET | Freq: Every day | ORAL | Status: DC
  Administered 2015-05-12 – 2015-05-13 (×2): 40 mg via ORAL
  Filled 2015-05-12 (×3): qty 1

## 2015-05-12 MED ORDER — CHLORHEXIDINE GLUCONATE 4 % EX LIQD: 60.0000 mL | Freq: Once | CUTANEOUS | Status: DC

## 2015-05-12 MED ORDER — MEPERIDINE HCL 50 MG/ML IJ SOLN: 6.2500 mg | INTRAMUSCULAR | Status: DC | PRN

## 2015-05-12 MED ORDER — DEXAMETHASONE SODIUM PHOSPHATE 10 MG/ML IJ SOLN
10.0000 mg | Freq: Once | INTRAMUSCULAR | Status: AC
  Administered 2015-05-12: 10 mg via INTRAVENOUS

## 2015-05-12 MED ORDER — PHENOL 1.4 % MT LIQD: 1.0000 | OROMUCOSAL | Status: DC | PRN

## 2015-05-12 MED ORDER — DIPHENHYDRAMINE HCL 25 MG PO CAPS: 25.0000 mg | ORAL_CAPSULE | Freq: Four times a day (QID) | ORAL | Status: DC | PRN

## 2015-05-12 MED ORDER — PROPOFOL 10 MG/ML IV BOLUS
INTRAVENOUS | Status: AC
  Filled 2015-05-12: qty 20

## 2015-05-12 MED ORDER — ASPIRIN EC 325 MG PO TBEC
325.0000 mg | DELAYED_RELEASE_TABLET | Freq: Two times a day (BID) | ORAL | Status: DC
  Administered 2015-05-13 – 2015-05-14 (×3): 325 mg via ORAL
  Filled 2015-05-12 (×5): qty 1

## 2015-05-12 MED ORDER — CARVEDILOL 25 MG PO TABS
25.0000 mg | ORAL_TABLET | Freq: Two times a day (BID) | ORAL | Status: DC
  Administered 2015-05-13 – 2015-05-14 (×3): 25 mg via ORAL
  Filled 2015-05-12 (×5): qty 1

## 2015-05-12 MED ORDER — LOSARTAN POTASSIUM 50 MG PO TABS
100.0000 mg | ORAL_TABLET | Freq: Every day | ORAL | Status: DC
  Administered 2015-05-12: 100 mg via ORAL
  Filled 2015-05-12 (×3): qty 2

## 2015-05-12 MED ORDER — MENTHOL 3 MG MT LOZG: 1.0000 | LOZENGE | OROMUCOSAL | Status: DC | PRN

## 2015-05-12 MED ORDER — ALUM & MAG HYDROXIDE-SIMETH 200-200-20 MG/5ML PO SUSP: 30.0000 mL | ORAL | Status: DC | PRN

## 2015-05-12 MED ORDER — POLYETHYLENE GLYCOL 3350 17 G PO PACK
17.0000 g | PACK | Freq: Two times a day (BID) | ORAL | Status: DC
  Administered 2015-05-13 – 2015-05-14 (×2): 17 g via ORAL

## 2015-05-12 MED ORDER — TRANEXAMIC ACID 1000 MG/10ML IV SOLN
1000.0000 mg | Freq: Once | INTRAVENOUS | Status: AC
  Administered 2015-05-12: 1000 mg via INTRAVENOUS
  Filled 2015-05-12: qty 10

## 2015-05-12 MED ORDER — ONDANSETRON HCL 4 MG/2ML IJ SOLN
INTRAMUSCULAR | Status: DC | PRN
  Administered 2015-05-12: 4 mg via INTRAVENOUS

## 2015-05-12 MED ORDER — METOCLOPRAMIDE HCL 10 MG PO TABS: 5.0000 mg | ORAL_TABLET | Freq: Three times a day (TID) | ORAL | Status: DC | PRN

## 2015-05-12 MED ORDER — PROPOFOL 500 MG/50ML IV EMUL
INTRAVENOUS | Status: DC | PRN
  Administered 2015-05-12: 100 ug/kg/min via INTRAVENOUS

## 2015-05-12 MED ORDER — CEFAZOLIN SODIUM-DEXTROSE 2-3 GM-% IV SOLR
2.0000 g | Freq: Four times a day (QID) | INTRAVENOUS | Status: AC
  Administered 2015-05-12 – 2015-05-13 (×2): 2 g via INTRAVENOUS
  Filled 2015-05-12 (×2): qty 50

## 2015-05-12 MED ORDER — ONDANSETRON HCL 4 MG/2ML IJ SOLN: 4.0000 mg | Freq: Four times a day (QID) | INTRAMUSCULAR | Status: DC | PRN

## 2015-05-12 MED ORDER — PROMETHAZINE HCL 25 MG/ML IJ SOLN: 6.2500 mg | INTRAMUSCULAR | Status: DC | PRN

## 2015-05-12 MED ORDER — FERROUS SULFATE 325 (65 FE) MG PO TABS
325.0000 mg | ORAL_TABLET | Freq: Three times a day (TID) | ORAL | Status: DC
  Administered 2015-05-13 – 2015-05-14 (×4): 325 mg via ORAL
  Filled 2015-05-12 (×7): qty 1

## 2015-05-12 MED ORDER — PHENYLEPHRINE HCL 10 MG/ML IJ SOLN
10.0000 mg | INTRAMUSCULAR | Status: DC | PRN
  Administered 2015-05-12: 50 ug/min via INTRAVENOUS

## 2015-05-12 MED ORDER — LIDOCAINE HCL (CARDIAC) 20 MG/ML IV SOLN
INTRAVENOUS | Status: AC
  Filled 2015-05-12: qty 5

## 2015-05-12 MED ORDER — HYDROCHLOROTHIAZIDE 25 MG PO TABS
25.0000 mg | ORAL_TABLET | Freq: Every day | ORAL | Status: DC
Start: 2015-05-12 — End: 2015-05-14
  Administered 2015-05-12 – 2015-05-14 (×2): 25 mg via ORAL
  Filled 2015-05-12 (×3): qty 1

## 2015-05-12 MED ORDER — FENTANYL CITRATE (PF) 100 MCG/2ML IJ SOLN
INTRAMUSCULAR | Status: AC
  Filled 2015-05-12: qty 4

## 2015-05-12 MED ORDER — HYDROMORPHONE HCL 1 MG/ML IJ SOLN: 0.5000 mg | INTRAMUSCULAR | Status: DC | PRN

## 2015-05-12 MED ORDER — LACTATED RINGERS IV SOLN: INTRAVENOUS | Status: DC

## 2015-05-12 MED ORDER — SODIUM CHLORIDE 0.9 % IV SOLN
100.0000 mL/h | INTRAVENOUS | Status: DC
  Administered 2015-05-12: 100 mL/h via INTRAVENOUS
  Filled 2015-05-12 (×7): qty 1000

## 2015-05-12 MED ORDER — BUPIVACAINE HCL (PF) 0.5 % IJ SOLN
INTRAMUSCULAR | Status: DC | PRN
  Administered 2015-05-12: 3 mL

## 2015-05-12 MED ORDER — DOCUSATE SODIUM 100 MG PO CAPS
100.0000 mg | ORAL_CAPSULE | Freq: Two times a day (BID) | ORAL | Status: DC
  Administered 2015-05-13 – 2015-05-14 (×3): 100 mg via ORAL

## 2015-05-12 MED ORDER — HYDROCODONE-ACETAMINOPHEN 7.5-325 MG PO TABS
1.0000 | ORAL_TABLET | ORAL | Status: DC
  Administered 2015-05-12 – 2015-05-13 (×5): 2 via ORAL
  Administered 2015-05-13: 1 via ORAL
  Administered 2015-05-13 – 2015-05-14 (×4): 2 via ORAL
  Filled 2015-05-12: qty 1
  Filled 2015-05-12 (×9): qty 2

## 2015-05-12 MED ORDER — ONDANSETRON HCL 4 MG PO TABS
4.0000 mg | ORAL_TABLET | Freq: Four times a day (QID) | ORAL | Status: DC | PRN
Start: 2015-05-12 — End: 2015-05-14

## 2015-05-12 MED ORDER — MAGNESIUM CITRATE PO SOLN: 1.0000 | Freq: Once | ORAL | Status: DC | PRN

## 2015-05-12 MED ORDER — MIDAZOLAM HCL 2 MG/2ML IJ SOLN
INTRAMUSCULAR | Status: AC
  Filled 2015-05-12: qty 4

## 2015-05-12 SURGICAL SUPPLY — 43 items
BAG DECANTER FOR FLEXI CONT (MISCELLANEOUS) IMPLANT
BAG ZIPLOCK 12X15 (MISCELLANEOUS) IMPLANT
CAPT HIP TOTAL 2 ×2 IMPLANT
COVER PERINEAL POST (MISCELLANEOUS) ×2 IMPLANT
DRAPE C-ARM 42X120 X-RAY (DRAPES) ×2 IMPLANT
DRAPE STERI IOBAN 125X83 (DRAPES) ×2 IMPLANT
DRAPE U-SHAPE 47X51 STRL (DRAPES) ×6 IMPLANT
DRSG AQUACEL AG ADV 3.5X10 (GAUZE/BANDAGES/DRESSINGS) ×2 IMPLANT
DURAPREP 26ML APPLICATOR (WOUND CARE) ×2 IMPLANT
ELECT BLADE TIP CTD 4 INCH (ELECTRODE) ×2 IMPLANT
ELECT PENCIL ROCKER SW 15FT (MISCELLANEOUS) IMPLANT
ELECT REM PT RETURN 15FT ADLT (MISCELLANEOUS) IMPLANT
ELECT REM PT RETURN 9FT ADLT (ELECTROSURGICAL) ×2
ELECTRODE REM PT RTRN 9FT ADLT (ELECTROSURGICAL) ×1 IMPLANT
FACESHIELD WRAPAROUND (MASK) ×8 IMPLANT
GLOVE BIOGEL PI IND STRL 7.5 (GLOVE) ×1 IMPLANT
GLOVE BIOGEL PI IND STRL 8.5 (GLOVE) ×1 IMPLANT
GLOVE BIOGEL PI INDICATOR 7.5 (GLOVE) ×1
GLOVE BIOGEL PI INDICATOR 8.5 (GLOVE) ×1
GLOVE ECLIPSE 8.0 STRL XLNG CF (GLOVE) ×4 IMPLANT
GLOVE ORTHO TXT STRL SZ7.5 (GLOVE) ×2 IMPLANT
GOWN SPEC L3 XXLG W/TWL (GOWN DISPOSABLE) ×2 IMPLANT
GOWN STRL REUS W/TWL LRG LVL3 (GOWN DISPOSABLE) ×2 IMPLANT
HOLDER FOLEY CATH W/STRAP (MISCELLANEOUS) ×2 IMPLANT
KIT BASIN OR (CUSTOM PROCEDURE TRAY) ×2 IMPLANT
LIQUID BAND (GAUZE/BANDAGES/DRESSINGS) ×2 IMPLANT
NDL SAFETY ECLIPSE 18X1.5 (NEEDLE) IMPLANT
NEEDLE HYPO 18GX1.5 SHARP (NEEDLE)
PACK TOTAL JOINT (CUSTOM PROCEDURE TRAY) ×2 IMPLANT
PEN SKIN MARKING BROAD (MISCELLANEOUS) ×2 IMPLANT
SAW OSC TIP CART 19.5X105X1.3 (SAW) ×2 IMPLANT
SUT MNCRL AB 4-0 PS2 18 (SUTURE) ×2 IMPLANT
SUT VIC AB 1 CT1 36 (SUTURE) ×6 IMPLANT
SUT VIC AB 2-0 CT1 27 (SUTURE) ×2
SUT VIC AB 2-0 CT1 TAPERPNT 27 (SUTURE) ×2 IMPLANT
SUT VLOC 180 0 24IN GS25 (SUTURE) ×2 IMPLANT
SYR 50ML LL SCALE MARK (SYRINGE) IMPLANT
TOWEL OR 17X26 10 PK STRL BLUE (TOWEL DISPOSABLE) ×2 IMPLANT
TOWEL OR NON WOVEN STRL DISP B (DISPOSABLE) IMPLANT
TRAY FOLEY W/METER SILVER 14FR (SET/KITS/TRAYS/PACK) ×2 IMPLANT
TRAY FOLEY W/METER SILVER 16FR (SET/KITS/TRAYS/PACK) IMPLANT
WATER STERILE IRR 1500ML POUR (IV SOLUTION) ×2 IMPLANT
YANKAUER SUCT BULB TIP 10FT TU (MISCELLANEOUS) ×2 IMPLANT

## 2015-05-12 NOTE — Transfer of Care (Signed)
Immediate Anesthesia Transfer of Care Note  Patient: Madeline Chandler  Procedure(s) Performed: Procedure(s): RIGHT TOTAL HIP ARTHROPLASTY ANTERIOR APPROACH (Right)  Patient Location: PACU  Anesthesia Type:Spinal  Level of Consciousness:  sedated, patient cooperative and responds to stimulation  Airway & Oxygen Therapy:Patient Spontanous Breathing and Patient connected to face mask oxgen  Post-op Assessment:  Report given to PACU RN and Post -op Vital signs reviewed and stable  Post vital signs:  Reviewed and stable  Last Vitals:  Filed Vitals:   05/12/15 1250  BP: 143/76  Pulse: 71  Temp: 36.8 C  Resp: 18    Complications: No apparent anesthesia complications

## 2015-05-12 NOTE — Interval H&P Note (Signed)
History and Physical Interval Note:  05/12/2015 1:39 PM  Madeline Chandler  has presented today for surgery, with the diagnosis of right hip AVN  The various methods of treatment have been discussed with the patient and family. After consideration of risks, benefits and other options for treatment, the patient has consented to  Procedure(s): RIGHT TOTAL HIP ARTHROPLASTY ANTERIOR APPROACH (Right) as a surgical intervention .  The patient's history has been reviewed, patient examined, no change in status, stable for surgery.  I have reviewed the patient's chart and labs.  Questions were answered to the patient's satisfaction.     Mauri Pole

## 2015-05-12 NOTE — Op Note (Signed)
NAME:  Madeline Chandler NO.: [id number]      MEDICAL RECORD NO.: [id number]      FACILITY:  [facility]      PHYSICIAN:  Paralee Cancel D  DATE OF BIRTH:  February 10, 1961     DATE OF PROCEDURE:  05/12/2015                                 OPERATIVE REPORT         PREOPERATIVE DIAGNOSIS: Right  Hip avascular necrosis.      POSTOPERATIVE DIAGNOSIS:  Right hip avascular necrosis.      PROCEDURE:  Right total hip replacement through an anterior approach   utilizing DePuy THR system, component size 25mm pinnacle cup, a size 32+4 neutral   Altrex liner, a size 4 Hi Tri Lock stem with a 32+1 delta ceramic   ball.      SURGEON:  Pietro Cassis. Alvan Dame, M.D.      ASSISTANT:  Danae Orleans, PA-C     ANESTHESIA:  Spinal.      SPECIMENS:  None.      COMPLICATIONS:  None.      BLOOD LOSS:  300 cc     DRAINS:  None.      INDICATION OF THE PROCEDURE:  Madeline Chandler is a 54 y.o. female who had   presented to office for evaluation of bilateral hip pain.  Radiographs revealed   bilateral femoral head avascular necrosis with collapse of femoral head.  There was associated progressive degenerative changes within the hip joint.  The patient had painful limited range of   motion significantly affecting their overall quality of life.  The patient was failing to    respond to conservative measures, and at this point was ready   to proceed with more definitive measures.  The patient has noted progressive   degenerative changes in his hip, progressive problems and dysfunction   with regarding the hip prior to surgery.  Consent was obtained for   benefit of pain relief.  Specific risk of infection, DVT, component   failure, dislocation, need for revision surgery, as well discussion of   the anterior versus posterior approach were reviewed.  Consent was   obtained for benefit of anterior pain relief through an anterior   approach.      PROCEDURE IN DETAIL:  The  patient was brought to operative theater.   Once adequate anesthesia, preoperative antibiotics, 2gm of Ancef, 1 gm of Tranexamic Acid, and 10 mg of Decadron administered.   The patient was positioned supine on the OSI Hanna table.  Once adequate   padding of boney process was carried out, we had predraped out the hip, and  used fluoroscopy to confirm orientation of the pelvis and position.      The right hip was then prepped and draped from proximal iliac crest to   mid thigh with shower curtain technique.      Time-out was performed identifying the patient, planned procedure, and   extremity.     An incision was then made 2 cm distal and lateral to the   anterior superior iliac spine extending over the orientation of the   tensor fascia lata muscle and sharp dissection was carried down to the   fascia of the muscle and protractor placed in the  soft tissues.      The fascia was then incised.  The muscle belly was identified and swept   laterally and retractor placed along the superior neck.  Following   cauterization of the circumflex vessels and removing some pericapsular   fat, a second cobra retractor was placed on the inferior neck.  A third   retractor was placed on the anterior acetabulum after elevating the   anterior rectus.  A L-capsulotomy was along the line of the   superior neck to the trochanteric fossa, then extended proximally and   distally.  Tag sutures were placed and the retractors were then placed   intracapsular.  We then identified the trochanteric fossa and   orientation of my neck cut, confirmed this radiographically   and then made a neck osteotomy with the femur on traction.  The femoral   head was removed without difficulty or complication.  Traction was let   off and retractors were placed posterior and anterior around the   acetabulum.      The labrum and foveal tissue were debrided.  I began reaming with a 66mm   reamer and reamed up to 93mm reamer with  good bony bed preparation and a 69mm   cup was chosen.  The final 45mm Pinnacle cup was then impacted under fluoroscopy  to confirm the depth of penetration and orientation with respect to   abduction.  A screw was placed followed by the hole eliminator.  The final   32+4 neutral Altrex liner was impacted with good visualized rim fit.  The cup was positioned anatomically within the acetabular portion of the pelvis.      At this point, the femur was rolled at 80 degrees.  Further capsule was   released off the inferior aspect of the femoral neck.  I then   released the superior capsule proximally.  The hook was placed laterally   along the femur and elevated manually and held in position with the bed   hook.  The leg was then extended and adducted with the leg rolled to 100   degrees of external rotation.  Once the proximal femur was fully   exposed, I used a box osteotome to set orientation.  I then began   broaching with the starting chili pepper broach and passed this by hand and then broached up to 4.  With the 4 broach in place I chose a high offset neck and did a trial reduction.  I plan to match her left hip to this right hip as she is scheduled to have it replaced in about a month or so.  Given these findings, I went ahead and dislocated the hip, repositioned all   retractors and positioned the right hip in the extended and abducted position.  The final 4 hi Tri Lock stem was   chosen and it was impacted down to the level of neck cut.  Based on this   and the trial reduction, a 32+1 delta ceramic ball was chosen and   impacted onto a clean and dry trunnion, and the hip was reduced.  The   hip had been irrigated throughout the case again at this point.  I did   reapproximate the superior capsular leaflet to the anterior leaflet   using #1 Vicryl.  The fascia of the   tensor fascia lata muscle was then reapproximated using #1 Vicryl and #0 V-lock sutures.  The   remaining wound was closed  with 2-0  Vicryl and running 4-0 Monocryl.   The hip was cleaned, dried, and dressed sterilely using Dermabond and   Aquacel dressing.  She was then brought   to recovery room in stable condition tolerating the procedure well.    Danae Orleans, PA-C was present for the entirety of the case involved from   preoperative positioning, perioperative retractor management, general   facilitation of the case, as well as primary wound closure as assistant.            Pietro Cassis Alvan Dame, M.D.        05/12/2015 5:28 PM

## 2015-05-12 NOTE — Anesthesia Postprocedure Evaluation (Signed)
  Anesthesia Post-op Note  Patient: Madeline Chandler  Procedure(s) Performed: Procedure(s): RIGHT TOTAL HIP ARTHROPLASTY ANTERIOR APPROACH (Right)  Patient Location: PACU  Anesthesia Type:Spinal  Level of Consciousness: awake, alert  and oriented  Airway and Oxygen Therapy: Patient Spontanous Breathing  Post-op Pain: none  Post-op Assessment: Post-op Vital signs reviewed and Patient's Cardiovascular Status Stable LLE Motor Response: Purposeful movement (toes) LLE Sensation: Decreased, Numbness RLE Motor Response: Purposeful movement (toes) RLE Sensation: Decreased, Numbness L Sensory Level: S1-Sole of foot, small toes R Sensory Level: S1-Sole of foot, small toes  Post-op Vital Signs: Reviewed and stable  Last Vitals:  Filed Vitals:   05/12/15 1845  BP: 136/68  Pulse: 50  Temp: 36.4 C  Resp: 14    Complications: No apparent anesthesia complications

## 2015-05-12 NOTE — Anesthesia Preprocedure Evaluation (Addendum)
Anesthesia Evaluation  Patient identified by MRN, date of birth, ID band Patient awake    Reviewed: Allergy & Precautions, NPO status , Patient's Chart, lab work & pertinent test results  Airway Mallampati: II  TM Distance: >3 FB Neck ROM: Full    Dental  (+) Teeth Intact   Pulmonary    breath sounds clear to auscultation       Cardiovascular hypertension, Pt. on medications  Rhythm:Regular Rate:Normal     Neuro/Psych  Headaches, PSYCHIATRIC DISORDERS Anxiety Depression    GI/Hepatic Neg liver ROS, GERD  Medicated,  Endo/Other  negative endocrine ROS  Renal/GU negative Renal ROS  negative genitourinary   Musculoskeletal  (+) Arthritis , Osteoarthritis,    Abdominal   Peds negative pediatric ROS (+)  Hematology negative hematology ROS (+)   Anesthesia Other Findings   Reproductive/Obstetrics                           Lab Results  Component Value Date   WBC 6.0 05/06/2015   HGB 12.5 05/06/2015   HCT 37.9 05/06/2015   MCV 87.3 05/06/2015   PLT 279 05/06/2015   Lab Results  Component Value Date   CREATININE 1.20* 05/06/2015   BUN 17 05/06/2015   NA 140 05/06/2015   K 3.8 05/06/2015   CL 102 05/06/2015   CO2 30 05/06/2015   Lab Results  Component Value Date   INR 1.04 05/06/2015   EKG: normal sinus rhythm.    Anesthesia Physical Anesthesia Plan  ASA: III  Anesthesia Plan: Spinal   Post-op Pain Management:    Induction: Intravenous  Airway Management Planned: Natural Airway and Simple Face Mask  Additional Equipment:   Intra-op Plan:   Post-operative Plan:   Informed Consent: I have reviewed the patients History and Physical, chart, labs and discussed the procedure including the risks, benefits and alternatives for the proposed anesthesia with the patient or authorized representative who has indicated his/her understanding and acceptance.   Dental advisory  given  Plan Discussed with:   Anesthesia Plan Comments:         Anesthesia Quick Evaluation

## 2015-05-13 ENCOUNTER — Encounter (HOSPITAL_COMMUNITY): Payer: Self-pay | Admitting: Orthopedic Surgery

## 2015-05-13 DIAGNOSIS — E669 Obesity, unspecified: Secondary | ICD-10-CM | POA: Diagnosis present

## 2015-05-13 LAB — BASIC METABOLIC PANEL
ANION GAP: 5 (ref 5–15)
BUN: 15 mg/dL (ref 6–20)
CALCIUM: 9.3 mg/dL (ref 8.9–10.3)
CO2: 29 mmol/L (ref 22–32)
Chloride: 104 mmol/L (ref 101–111)
Creatinine, Ser: 0.97 mg/dL (ref 0.44–1.00)
Glucose, Bld: 151 mg/dL — ABNORMAL HIGH (ref 65–99)
POTASSIUM: 4.3 mmol/L (ref 3.5–5.1)
Sodium: 138 mmol/L (ref 135–145)

## 2015-05-13 LAB — CBC
HEMATOCRIT: 32.1 % — AB (ref 36.0–46.0)
Hemoglobin: 10.5 g/dL — ABNORMAL LOW (ref 12.0–15.0)
MCH: 29.1 pg (ref 26.0–34.0)
MCHC: 32.7 g/dL (ref 30.0–36.0)
MCV: 88.9 fL (ref 78.0–100.0)
PLATELETS: 215 10*3/uL (ref 150–400)
RBC: 3.61 MIL/uL — AB (ref 3.87–5.11)
RDW: 14.1 % (ref 11.5–15.5)
WBC: 10.3 10*3/uL (ref 4.0–10.5)

## 2015-05-13 MED ORDER — CYCLOBENZAPRINE HCL 10 MG PO TABS: 10.0000 mg | ORAL_TABLET | Freq: Three times a day (TID) | ORAL | Status: DC | PRN

## 2015-05-13 MED ORDER — ASPIRIN 325 MG PO TBEC: 325.0000 mg | DELAYED_RELEASE_TABLET | Freq: Two times a day (BID) | ORAL | Status: AC

## 2015-05-13 MED ORDER — DOCUSATE SODIUM 100 MG PO CAPS: 100.0000 mg | ORAL_CAPSULE | Freq: Two times a day (BID) | ORAL | Status: DC

## 2015-05-13 MED ORDER — POLYETHYLENE GLYCOL 3350 17 G PO PACK: 17.0000 g | PACK | Freq: Two times a day (BID) | ORAL | Status: DC

## 2015-05-13 MED ORDER — FERROUS SULFATE 325 (65 FE) MG PO TABS: 325.0000 mg | ORAL_TABLET | Freq: Three times a day (TID) | ORAL | Status: DC

## 2015-05-13 MED ORDER — HYDROCODONE-ACETAMINOPHEN 7.5-325 MG PO TABS: 1.0000 | ORAL_TABLET | ORAL | Status: DC | PRN

## 2015-05-13 NOTE — Progress Notes (Signed)
     Subjective: 1 Day Post-Op Procedure(s) (LRB): RIGHT TOTAL HIP ARTHROPLASTY ANTERIOR APPROACH (Right)   Seen by Dr. Alvan Dame. Patient reports pain as mild, pain controlled. No events throughout the night.  Objective:   VITALS:   Filed Vitals:   05/13/15 0540  BP: 131/76  Pulse: 63  Temp: 97.9 F (36.6 C)  Resp: 16    Dorsiflexion/Plantar flexion intact Incision: dressing C/D/I No cellulitis present Compartment soft  LABS  Recent Labs  05/13/15 0432  HGB 10.5*  HCT 32.1*  WBC 10.3  PLT 215     Recent Labs  05/13/15 0432  NA 138  K 4.3  BUN 15  CREATININE 0.97  GLUCOSE 151*     Assessment/Plan: 1 Day Post-Op Procedure(s) (LRB): RIGHT TOTAL HIP ARTHROPLASTY ANTERIOR APPROACH (Right) Foley cath d/c'ed Advance diet Up with therapy D/C IV fluids Discharge home with home health eventually, when ready  Obese (BMI 30-39.9) Estimated body mass index is 35.76 kg/(m^2) as calculated from the following:   Height as of this encounter: 5' 5.5" (1.664 m).   Weight as of this encounter: 99.026 kg (218 lb 5 oz). Patient also counseled that weight may inhibit the healing process Patient counseled that losing weight will help with future health issues     Madeline Chandler   PAC  05/13/2015, 8:35 AM

## 2015-05-13 NOTE — Discharge Instructions (Signed)

## 2015-05-13 NOTE — Care Management Note (Signed)
Case Management Note  Patient Details  Name: Madeline Chandler MRN: [id number] Date of Birth: 1961-04-15  Subjective/Objective:                  RIGHT TOTAL HIP ARTHROPLASTY ANTERIOR APPROACH (Right)  Action/Plan: Discharge planning  Expected Discharge Date:                  Expected Discharge Plan:  [place]  In-House Referral:     Discharge planning Services  CM Consult  Post Acute Care Choice:  Home Health Choice offered to:  Patient  DME Arranged:  3-N-1, Walker rolling DME Agency:  [place]:  PT [place]:  [place]  Status of Service:  Completed, signed off  Medicare Important Message Given:    Date Medicare IM Given:    Medicare IM give by:    Date Additional Medicare IM Given:    Additional Medicare Important Message give by:     If discussed at [place] of Stay Meetings, dates discussed:    Additional Comments: CM met with pt in room to offer choice of home health agency.  Pt chooses [facility] to render HHPT.  Referral given to [facility] rep, tim  (on unit).  CM called [facility] DME rep, Merry Proud to please deliver the rolling walker and 3n1.  No other CM needs were communicated.   Dellie Catholic, RN 05/13/2015, 12:32 PM

## 2015-05-13 NOTE — Progress Notes (Signed)
Physical Therapy Treatment Patient Details Name: Madeline Chandler MRN: [id number] DOB: Oct 14, 1960 Today's Date: 05/13/2015    History of Present Illness This 54 y.o. female admitted for Lt THA direct anterior approach.  PMH includes:  HTN, h/o breast CA, GERD, anxiety, restless leg syndrome     PT Comments    Patient practiced getting onto high bed with foot stool, will need to practice how to arrange RW with foot rest. Patient to practice with leg lifter next time OOB .  Ambulated x 25'  Follow Up Recommendations  Home health PT;Supervision - Intermittent     Equipment Recommendations  Rolling walker with 5" wheels;3in1 (PT)    Recommendations for Other Services       Precautions / Restrictions Precautions Precautions: Fall Precaution Comments: BP low Restrictions Weight Bearing Restrictions: Yes RLE Weight Bearing: Weight bearing as tolerated    Mobility  Bed Mobility Overal bed mobility: Needs Assistance Bed Mobility: Sit to Supine     Supine to sit: Min assist Sit to supine: Mod assist   General bed mobility comments: assist with R leg and cues for technique  Transfers Overall transfer level: Needs assistance Equipment used: Rolling walker (2 wheeled) Transfers: Sit to/from Stand Sit to Stand: Min guard         General transfer comment: cues for sequence and hand and R leg position  Ambulation/Gait Ambulation/Gait assistance: Min assist;+2 safety/equipment Ambulation Distance (Feet): 25 Feet Assistive device: Rolling walker (2 wheeled) Gait Pattern/deviations: Step-to pattern;Antalgic     General Gait Details: cues for sequence   Stairs            Wheelchair Mobility    Modified Rankin (Stroke Patients Only)       Balance Overall balance assessment: Needs assistance Sitting-balance support: Feet supported Sitting balance-Leahy Scale: Fair     Standing balance support: Bilateral upper extremity supported Standing balance-Leahy  Scale: Poor                      Cognition Arousal/Alertness: Awake/alert Behavior During Therapy: Anxious Overall Cognitive Status: Within Functional Limits for tasks assessed                      Exercises Total Joint Exercises Ankle Circles/Pumps: AROM;Both;10 reps;Supine Heel Slides: AAROM;Right;5 reps;Supine Hip ABduction/ADduction: AAROM;Right;5 reps;Supine    General Comments General comments (skin integrity, edema, etc.): BP supine 130./60; sitting 133/67/ standing 125/65.  HR 70; 02 sats 100% - RN notified of vitals and pt session       Pertinent Vitals/Pain Pain Assessment: 0-10 Pain Score: 3  Pain Location: R thigh Pain Descriptors / Indicators: Burning;Tightness Pain Intervention(s): Monitored during session;Repositioned;Ice applied    Home Living Family/patient expects to be discharged to:: Private residence Living Arrangements: Other relatives Available Help at Discharge: Family Type of Home: House Home Access: Stairs to enter   Home Layout: One level Home Equipment: None Additional Comments: Pt will be staying with her sister who works during the day     Prior Function Level of Independence: Independent      Comments: Pt was working full time PTA. Pt reports she required increased time for dressing and bathing, but was able to perform mod I    PT Goals (current goals can now be found in the care plan section) Acute Rehab PT Goals Patient Stated Goal: to regain independence  PT Goal Formulation: With patient Time For Goal Achievement: 05/17/15 Potential to Achieve Goals: Good Progress towards  PT goals: Progressing toward goals    Frequency  7X/week    PT Plan Current plan remains appropriate    Co-evaluation             End of Session   Activity Tolerance: Patient tolerated treatment well Patient left: in bed;with call bell/phone within reach     Time: 1513-1540 PT Time Calculation (min) (ACUTE ONLY): 27  min  Charges:  $Gait Training: 23-37 mins                    G Codes:      Madeline Chandler 05/13/2015, 3:47 PM

## 2015-05-13 NOTE — Evaluation (Signed)
Occupational Therapy Evaluation Patient Details Name: Madeline Chandler MRN: [id number] DOB: 06/23/1961 Today's Date: 05/13/2015    History of Present Illness This 54 y.o. female admitted for Lt THA direct anterior approach.  PMH includes:  HTN, h/o breast CA, GERD, anxiety, restless leg syndrome    Clinical Impression   Pt admitted with above. She demonstrates the below listed deficits and will benefit from continued OT to maximize safety and independence with BADLs.  Pt limited during eval by complaint dizziness (BP stable - see below in comments), and anxiety.   She plans to return home with sister who works during the day.   Will follow acutely.   Recommend HHOT at discharge.       Follow Up Recommendations  Home health OT;Supervision/Assistance - 24 hour    Equipment Recommendations  3 in 1 bedside comode;Tub/shower bench    Recommendations for Other Services       Precautions / Restrictions Precautions Precautions: Fall Restrictions Weight Bearing Restrictions: Yes RLE Weight Bearing: Weight bearing as tolerated      Mobility Bed Mobility Overal bed mobility: Needs Assistance Bed Mobility: Supine to Sit;Sit to Supine     Supine to sit: Min assist Sit to supine: Min assist   General bed mobility comments: Pt requires increased time and assist with Rt LE  Transfers Overall transfer level: Needs assistance   Transfers: Stand Pivot Transfers;Sit to/from Stand Sit to Stand: Min assist         General transfer comment: Pt moved sit to stand with min A.  Pt reported dizziness, BP 133/67, and increased anxiety so returned to supine     Balance Overall balance assessment: Needs assistance Sitting-balance support: Feet supported Sitting balance-Leahy Scale: Fair     Standing balance support: Bilateral upper extremity supported Standing balance-Leahy Scale: Poor                              ADL Overall ADL's : Needs  assistance/impaired Eating/Feeding: Independent   Grooming: Wash/dry hands;Wash/dry face;Oral care;Set up;Sitting;Bed level   Upper Body Bathing: Set up;Sitting;Bed level   Lower Body Bathing: Maximal assistance;Sit to/from stand   Upper Body Dressing : Set up;Sitting   Lower Body Dressing: Total assistance;Sit to/from stand   Toilet Transfer: Minimal assistance;Stand-pivot;BSC;RW   Toileting- Clothing Manipulation and Hygiene: Total assistance;Sit to/from stand       Functional mobility during ADLs: Minimal assistance;Rolling walker General ADL Comments: Pt limited by feeling dizzy and anxiety      Vision     Perception     Praxis      Pertinent Vitals/Pain Pain Assessment: 0-10 Pain Score: 5  Pain Location: Rt hip with movement.  Pain 2/10 at end of session  Pain Descriptors / Indicators: Aching Pain Intervention(s): Monitored during session;Ice applied;Repositioned     Hand Dominance Right   Extremity/Trunk Assessment Upper Extremity Assessment Upper Extremity Assessment: Overall WFL for tasks assessed   Lower Extremity Assessment Lower Extremity Assessment: Defer to PT evaluation   Cervical / Trunk Assessment Cervical / Trunk Assessment: Normal   Communication Communication Communication: No difficulties   Cognition Arousal/Alertness: Awake/alert Behavior During Therapy: Anxious Overall Cognitive Status: Within Functional Limits for tasks assessed                     General Comments       Exercises       Shoulder Instructions  Home Living Family/patient expects to be discharged to:: Private residence Living Arrangements: Other relatives Available Help at Discharge: Family Type of Home: House Home Access: Stairs to enter Technical brewer of Steps: 1   Home Layout: One level     Bathroom Shower/Tub: Tub/shower unit;Curtain Shower/tub characteristics: Architectural technologist: Standard Bathroom Accessibility: Yes    Home Equipment: None   Additional Comments: Pt will be staying with her sister who works during the day       Prior Functioning/Environment Level of Independence: Independent        Comments: Pt was working full time PTA. Pt reports she required increased time for dressing and bathing, but was able to perform mod I     OT Diagnosis: Generalized weakness;Acute pain   OT Problem List: Decreased strength;Decreased activity tolerance;Impaired balance (sitting and/or standing);Decreased safety awareness;Decreased knowledge of use of DME or AE;Decreased knowledge of precautions;Obesity;Pain   OT Treatment/Interventions: Self-care/ADL training;DME and/or AE instruction;Therapeutic activities;Patient/family education;Balance training    OT Goals(Current goals can be found in the care plan section) Acute Rehab OT Goals Patient Stated Goal: to regain independence  OT Goal Formulation: With patient Time For Goal Achievement: 05/20/15 Potential to Achieve Goals: Good ADL Goals Pt Will Perform Grooming: with supervision;standing Pt Will Perform Lower Body Bathing: with supervision;with adaptive equipment;sit to/from stand Pt Will Perform Lower Body Dressing: with supervision;with adaptive equipment;sit to/from stand Pt Will Transfer to Toilet: with supervision;ambulating;regular height toilet;bedside commode;grab bars Pt Will Perform Toileting - Clothing Manipulation and hygiene: with supervision;sit to/from stand Pt Will Perform Tub/Shower Transfer: Tub transfer;with supervision;ambulating;tub bench;rolling walker  OT Frequency: Min 2X/week   Barriers to D/C: Decreased caregiver support          Co-evaluation              End of Session Equipment Utilized During Treatment: Rolling walker Nurse Communication: Mobility status  Activity Tolerance: Other (comment) (anxiety ) Patient left: in bed;with call bell/phone within reach   Time: 6440-3474 OT Time Calculation (min): 32  min Charges:  OT General Charges $OT Visit: 1 Procedure OT Evaluation $Initial OT Evaluation Tier I: 1 Procedure OT Treatments $Therapeutic Activity: 8-22 mins G-Codes:    Jacari Iannello M 05-30-2015, 12:16 PM

## 2015-05-13 NOTE — Evaluation (Signed)
Physical Therapy Evaluation Patient Details Name: Madeline Chandler MRN: [id number] DOB: 1961-03-14 Today's Date: 05/13/2015   History of Present Illness  This 55 y.o. female admitted for Lt THA direct anterior approach.  PMH includes:  HTN, h/o breast CA, GERD, anxiety, restless leg syndrome   Clinical Impression  Patient did get to recliner, mild  Stridor episode but tolerated  To recliner. Will ambulate this PM.    Follow Up Recommendations Home health PT;Supervision - Intermittent    Equipment Recommendations  Rolling walker with 5" wheels;3in1 (PT)    Recommendations for Other Services       Precautions / Restrictions Precautions Precautions: Fall Restrictions Weight Bearing Restrictions: Yes RLE Weight Bearing: Weight bearing as tolerated      Mobility  Bed Mobility Overal bed mobility: Needs Assistance Bed Mobility: Supine to Sit     Supine to sit: Min assist Sit to supine: Min assist   General bed mobility comments: used sheet around the  R leg to slide to edge, cues for technique  Transfers Overall transfer level: Needs assistance Equipment used: Rolling walker (2 wheeled) Transfers: Sit to/from Stand Sit to Stand: Min assist         General transfer comment: cues for sequence and hand and R leg position  Ambulation/Gait Ambulation/Gait assistance: Min assist Ambulation Distance (Feet): 6 Feet Assistive device: Rolling walker (2 wheeled) Gait Pattern/deviations: Step-to pattern     General Gait Details: cues for sequence  Stairs            Wheelchair Mobility    Modified Rankin (Stroke Patients Only)       Balance Overall balance assessment: Needs assistance Sitting-balance support: Feet supported Sitting balance-Leahy Scale: Fair     Standing balance support: Bilateral upper extremity supported Standing balance-Leahy Scale: Poor                               Pertinent Vitals/Pain Pain Assessment: 0-10 Pain Score:  4  Pain Location: Right thigh Pain Descriptors / Indicators: Aching;Tender Pain Intervention(s): Monitored during session;RN gave pain meds during session;Ice applied    Home Living Family/patient expects to be discharged to:: Private residence Living Arrangements: Other relatives Available Help at Discharge: Family Type of Home: House Home Access: Stairs to enter   Technical brewer of Steps: 1 Home Layout: One level Home Equipment: None Additional Comments: Pt will be staying with her sister who works during the day     Prior Function Level of Independence: Independent         Comments: Pt was working full time PTA. Pt reports she required increased time for dressing and bathing, but was able to perform mod I      Hand Dominance   Dominant Hand: Right    Extremity/Trunk Assessment   Upper Extremity Assessment: Overall WFL for tasks assessed           Lower Extremity Assessment: RLE deficits/detail RLE Deficits / Details: assist with moving the leg to edge of the bed    Cervical / Trunk Assessment: Normal  Communication   Communication: No difficulties  Cognition Arousal/Alertness: Awake/alert Behavior During Therapy: Anxious Overall Cognitive Status: Within Functional Limits for tasks assessed                      General Comments General comments (skin integrity, edema, etc.): BP supine 130./60; sitting 133/67/ standing 125/65.  HR 70; 02 sats 100% -  RN notified of vitals and pt session     Exercises Total Joint Exercises Ankle Circles/Pumps: AROM;Both;10 reps;Supine Heel Slides: AAROM;Right;5 reps;Supine Hip ABduction/ADduction: AAROM;Right;5 reps;Supine      Assessment/Plan    PT Assessment Patient needs continued PT services  PT Diagnosis Difficulty walking;Acute pain   PT Problem List Decreased strength;Decreased range of motion;Decreased activity tolerance;Decreased mobility;Decreased knowledge of precautions;Decreased safety  awareness;Decreased knowledge of use of DME;Pain  PT Treatment Interventions DME instruction;Gait training;Stair training;Functional mobility training;Therapeutic activities;Therapeutic exercise;Patient/family education   PT Goals (Current goals can be found in the Care Plan section) Acute Rehab PT Goals Patient Stated Goal: to regain independence  PT Goal Formulation: With patient Time For Goal Achievement: 05/17/15 Potential to Achieve Goals: Good    Frequency 7X/week   Barriers to discharge Decreased caregiver support      Co-evaluation               End of Session   Activity Tolerance: Patient tolerated treatment well Patient left: in chair;with call bell/phone within reach Nurse Communication: Mobility status         Time: 1224-1257 PT Time Calculation (min) (ACUTE ONLY): 33 min   Charges:   PT Evaluation $Initial PT Evaluation Tier I: 1 Procedure PT Treatments $Gait Training: 8-22 mins   PT G Codes:        [name] 05/13/2015, 1:42 PM

## 2015-05-14 LAB — BASIC METABOLIC PANEL
ANION GAP: 4 — AB (ref 5–15)
BUN: 23 mg/dL — ABNORMAL HIGH (ref 6–20)
CHLORIDE: 103 mmol/L (ref 101–111)
CO2: 30 mmol/L (ref 22–32)
Calcium: 9.4 mg/dL (ref 8.9–10.3)
Creatinine, Ser: 0.97 mg/dL (ref 0.44–1.00)
GFR calc non Af Amer: >60 mL/min (ref >60)
Glucose, Bld: 154 mg/dL — ABNORMAL HIGH (ref 65–99)
POTASSIUM: 3.9 mmol/L (ref 3.5–5.1)
SODIUM: 137 mmol/L (ref 135–145)

## 2015-05-14 LAB — CBC
HEMATOCRIT: 29.6 % — AB (ref 36.0–46.0)
HEMOGLOBIN: 9.5 g/dL — AB (ref 12.0–15.0)
MCH: 28.8 pg (ref 26.0–34.0)
MCHC: 32.1 g/dL (ref 30.0–36.0)
MCV: 89.7 fL (ref 78.0–100.0)
Platelets: 209 10*3/uL (ref 150–400)
RBC: 3.3 MIL/uL — AB (ref 3.87–5.11)
RDW: 14.4 % (ref 11.5–15.5)
WBC: 13.1 10*3/uL — ABNORMAL HIGH (ref 4.0–10.5)

## 2015-05-14 NOTE — Progress Notes (Signed)
Physical Therapy Treatment Patient Details Name: Madeline Chandler MRN: [id number] DOB: 04/25/1961 Today's Date: 05/14/2015    History of Present Illness This 54 y.o. female admitted for Lt THA direct anterior approach.  PMH includes:  HTN, h/o breast CA, GERD, anxiety, restless leg syndrome     PT Comments    Patient has practiced the step, dizziness at times, BP standing 124/58. Patient can benefit from a leg lifter. More difficulty with getting into bed,  Follow Up Recommendations  Home health PT;Supervision - Intermittent     Equipment Recommendations  Rolling walker with 5" wheels;3in1 (PT) (leg lifter)    Recommendations for Other Services       Precautions / Restrictions Precautions Precautions: Fall Precaution Comments: BP low Restrictions Weight Bearing Restrictions: Yes RLE Weight Bearing: Weight bearing as tolerated    Mobility  Bed Mobility Overal bed mobility: Needs Assistance Bed Mobility: Sit to Supine     Supine to sit: Supervision Sit to supine: Mod assist   General bed mobility comments: assist with R leg even with use of the leg lifter  Transfers Overall transfer level: Needs assistance Equipment used: Rolling walker (2 wheeled) Transfers: Sit to/from Stand Sit to Stand: Supervision         General transfer comment: Supervision t  Ambulation/Gait Ambulation/Gait assistance: Min guard Ambulation Distance (Feet): 60 Feet Assistive device: Rolling walker (2 wheeled) Gait Pattern/deviations: Step-to pattern;Step-through pattern Gait velocity: decreased   General Gait Details: cues for sequence, c/o lightheadedness, did  not worsen, left with OT in bathroom.   Stairs Stairs: Yes Stairs assistance: Min assist Stair Management: Backwards;With walker Number of Stairs: 1 General stair comments: practiced x 2 ,  Wheelchair Mobility    Modified Rankin (Stroke Patients Only)       Balance Overall balance assessment: Needs  assistance Sitting-balance support: No upper extremity supported;Feet supported Sitting balance-Leahy Scale: Good     Standing balance support: Bilateral upper extremity supported;During functional activity Standing balance-Leahy Scale: Poor                      Cognition Arousal/Alertness: Awake/alert Behavior During Therapy: WFL for tasks assessed/performed Overall Cognitive Status: Within Functional Limits for tasks assessed                      Exercises Total Joint Exercises Ankle Circles/Pumps: AROM;Both;10 reps;Supine Quad Sets: AROM;Both;10 reps;Supine Short Arc Quad: AROM;Right;10 reps;Supine Heel Slides: AAROM;Right;10 reps;Supine Hip ABduction/ADduction: AAROM;Right;10 reps;Supine    General Comments        Pertinent Vitals/Pain Pain Assessment: Faces Pain Score: 2  Faces Pain Scale: Hurts little more Pain Location: r THIGH Pain Descriptors / Indicators: Aching;Tightness;Tingling Pain Intervention(s): Monitored during session;Repositioned    Home Living                      Prior Function            PT Goals (current goals can now be found in the care plan section) Progress towards PT goals: Progressing toward goals    Frequency  7X/week    PT Plan Current plan remains appropriate    Co-evaluation             End of Session Equipment Utilized During Treatment: Gait belt Activity Tolerance: Patient tolerated treatment well Patient left: in bed;with call bell/phone within reach;with nursing/sitter in room     Time: 1434-1520 PT Time Calculation (min) (ACUTE ONLY): 46 min  Charges:  $  Gait Training: 23-37 mins $Therapeutic Exercise: 8-22 mins $Self Care/Home Management: 8-22                    G Codes:      [name] 05/14/2015, 3:30 PM

## 2015-05-14 NOTE — Progress Notes (Signed)
     Subjective: 2 Days Post-Op Procedure(s) (LRB): RIGHT TOTAL HIP ARTHROPLASTY ANTERIOR APPROACH (Right)   Patient reports pain as mild, pain controlled. No events throughout the night. Very happy with having had a spinal and no N&V post-op, which has happened previously. Ready to be discharged home.  Objective:   VITALS:   Filed Vitals:   05/14/15 0518  BP: 119/68  Pulse: 71  Temp: 97.8 F (36.6 C)  Resp: 16    Dorsiflexion/Plantar flexion intact Incision: dressing C/D/I No cellulitis present Compartment soft  LABS  Recent Labs  05/13/15 0432 05/14/15 0456  HGB 10.5* 9.5*  HCT 32.1* 29.6*  WBC 10.3 13.1*  PLT 215 209     Recent Labs  05/13/15 0432 05/14/15 0456  NA 138 137  K 4.3 3.9  BUN 15 23*  CREATININE 0.97 0.97  GLUCOSE 151* 154*     Assessment/Plan: 2 Days Post-Op Procedure(s) (LRB): RIGHT TOTAL HIP ARTHROPLASTY ANTERIOR APPROACH (Right) Up with therapy Discharge home with home health  Follow up in 2 weeks at [facility]. Follow up with OLIN,Venissa Nappi D in 2 weeks.  Contact information:  [facility] [address] [place] [phone]    Obese (BMI 30-39.9) Estimated body mass index is 35.76 kg/(m^2) as calculated from the following:   Height as of this encounter: 5' 5.5" (1.664 m).   Weight as of this encounter: 99.026 kg (218 lb 5 oz). Patient also counseled that weight may inhibit the healing process Patient counseled that losing weight will help with future health issues       [place] Pugh. Jessalyn Hinojosa   PAC  05/14/2015, 10:09 AM

## 2015-05-14 NOTE — Addendum Note (Signed)
Addendum  created 05/14/15 9753 by Lissa Morales, CRNA   Modules edited: Anesthesia Attestations

## 2015-05-14 NOTE — Plan of Care (Signed)
Problem: Consults Goal: Diagnosis- Total Joint Replacement Problem: Consults Goal: Diagnosis- Total Joint Replacement Right total knee

## 2015-05-14 NOTE — Progress Notes (Signed)
Physical Therapy Treatment Patient Details Name: Madeline Chandler MRN: [id number] DOB: 08-23-1960 Today's Date: 05/14/2015    History of Present Illness This 54 y.o. female admitted for Lt THA direct anterior approach.  PMH includes:  HTN, h/o breast CA, GERD, anxiety, restless leg syndrome     PT Comments    Patient mobilizing self to edge of the bed with Leg lifter, c/o dizziness  Standing, ambulated into BR. Will return for further ambulation and 1 step if not dizzy and low BP.  Follow Up Recommendations  Home health PT;Supervision - Intermittent     Equipment Recommendations  Rolling walker with 5" wheels;3in1 (PT)    Recommendations for Other Services       Precautions / Restrictions Precautions Precautions: Fall Precaution Comments: BP low Restrictions RLE Weight Bearing: Weight bearing as tolerated    Mobility  Bed Mobility Overal bed mobility: Needs Assistance Bed Mobility: Supine to Sit     Supine to sit: Supervision     General bed mobility comments: cues for use  Leg lifter to get R leg to edge of bed, did well.   Transfers Overall transfer level: Needs assistance Equipment used: Rolling walker (2 wheeled) Transfers: Sit to/from Stand Sit to Stand: Supervision         General transfer comment: cues for sequence and hand and R leg position, cues for safety, how to place RW near bed to  get it once standing  Ambulation/Gait Ambulation/Gait assistance: Min guard Ambulation Distance (Feet): 25 Feet Assistive device: Rolling walker (2 wheeled) Gait Pattern/deviations: Step-to pattern;Decreased step length - right;Decreased stance time - right Gait velocity: decreased   General Gait Details: cues for sequence, c/o lightheadedness, did  not worsen, left with OT in bathroom.   Stairs            Wheelchair Mobility    Modified Rankin (Stroke Patients Only)       Balance                                    Cognition  Arousal/Alertness: Awake/alert                          Exercises Total Joint Exercises Ankle Circles/Pumps: AROM;Both;10 reps;Supine Quad Sets: AROM;Both;10 reps;Supine Short Arc Quad: AROM;Right;10 reps;Supine Heel Slides: AAROM;Right;10 reps;Supine Hip ABduction/ADduction: AAROM;Right;10 reps;Supine    General Comments        Pertinent Vitals/Pain Pain Score: 3  Pain Location: R thigh Pain Descriptors / Indicators: Burning;Tightness Pain Intervention(s): Monitored during session;Premedicated before session;Repositioned    Home Living                      Prior Function            PT Goals (current goals can now be found in the care plan section) Progress towards PT goals: Progressing toward goals    Frequency  7X/week    PT Plan Current plan remains appropriate    Co-evaluation             End of Session   Activity Tolerance: Patient tolerated treatment well Patient left:  (with OT)     Time: 6761-9509 PT Time Calculation (min) (ACUTE ONLY): 34 min  Charges:  $Gait Training: 8-22 mins $Therapeutic Exercise: 8-22 mins  G Codes:      [name] 05/14/2015, 11:52 AM

## 2015-05-14 NOTE — Progress Notes (Signed)
Occupational Therapy Treatment Patient Details Name: Madeline Chandler MRN: [id number] DOB: 04/01/1961 Today's Date: 05/14/2015    History of present illness This 54 y.o. female admitted for Lt THA direct anterior approach.  PMH includes:  HTN, h/o breast CA, GERD, anxiety, restless leg syndrome    OT comments  Patient progressing towards OT goals, continue plan of care for now. Pt somewhat limited by dizziness with certain movements, but able to work through this. Educated pt on AE to increase independence with LB ADLs.    Follow Up Recommendations  Home health OT;Supervision/Assistance - 24 hour    Equipment Recommendations  3 in 1 bedside comode;Tub/shower bench    Recommendations for Other Services  None at this time   Precautions / Restrictions Precautions Precautions: Fall Precaution Comments: BP low Restrictions Weight Bearing Restrictions: Yes RLE Weight Bearing: Weight bearing as tolerated    Mobility Bed Mobility Overal bed mobility: Needs Assistance Bed Mobility: Supine to Sit     Supine to sit: Supervision     General bed mobility comments: cues for use  Leg lifter to get R leg to edge of bed, did well.   Transfers Overall transfer level: Needs assistance Equipment used: Rolling walker (2 wheeled) Transfers: Sit to/from Stand Sit to Stand: Supervision;Min guard General transfer comment: Supervision to min guard for safety with sit to and from stands. Pt required min guard in standing at toilet for toileting hygiene.     Balance Overall balance assessment: Needs assistance Sitting-balance support: No upper extremity supported;Feet supported Sitting balance-Leahy Scale: Good     Standing balance support: Bilateral upper extremity supported;During functional activity Standing balance-Leahy Scale: Poor   ADL Overall ADL's : Needs assistance/impaired Eating/Feeding: Independent   Grooming: Wash/dry hands;Min guard;Standing   Upper Body Bathing: Set  up;Sitting   Lower Body Bathing: Minimal assistance;Cueing for safety;Sit to/from stand;With adaptive equipment   Upper Body Dressing : Set up;Sitting   Lower Body Dressing: Minimal assistance;Sit to/from stand;Cueing for safety;With adaptive equipment   Toilet Transfer: BSC;RW;Ambulation;Min guard   Toileting- Clothing Manipulation and Hygiene: Sit to/from stand;Min guard;Cueing for Health visitor Details (indicate cue type and reason): did not occur, discussed tub/shower technique and encouraged pt to perform sponge baths prior to [employer] Functional mobility during ADLs: Rolling walker;Min guard General ADL Comments: Pt continues to be limited by some dizziness, but willing to work with therapist. Introduced, demonstrated, pt return demonstrated use of AE for LB ADLs. Pt eager to use AE to increase independence.      Cognition   Behavior During Therapy: WFL for tasks assessed/performed Overall Cognitive Status: Within Functional Limits for tasks assessed                Pertinent Vitals/ Pain       Pain Assessment: Faces Pain Score: 3  Faces Pain Scale: Hurts little more Pain Location: right hip Pain Descriptors / Indicators: Grimacing;Guarding;Sore Pain Intervention(s): Monitored during session;Repositioned   Frequency Min 2X/week     Progress Toward Goals  OT Goals(current goals can now befound in the care plan section)  Progress towards OT goals: Progressing toward goals     Plan Discharge plan remains appropriate    End of Session Equipment Utilized During Treatment: Rolling walker   Activity Tolerance Patient tolerated treatment well   Patient Left in chair;with call bell/phone within reach;with family/visitor present    Time: 0626-9485 OT Time Calculation (min): 29 min  Charges: OT General Charges $OT Visit: 1 Procedure  OT Treatments $Self Care/Home Management : 23-37 mins  Yared Barefoot , MS, OTR/L, CLT Pager: [phone]  05/14/2015,  12:02 PM

## 2015-05-18 NOTE — Discharge Summary (Signed)
Physician Discharge Summary  Patient ID: Madeline Chandler MRN: [id number] DOB/AGE: 08-Nov-1960 54 y.o.  Admit date: 05/12/2015 Discharge date: 05/14/2015   Procedures:  Procedure(s) (LRB): RIGHT TOTAL HIP ARTHROPLASTY ANTERIOR APPROACH (Right)  Attending Physician:  Dr. Paralee Cancel   Admission Diagnoses:   Right hip AVN / pain  Discharge Diagnoses:  Principal Problem:   S/P right THA, AA Active Problems:   Obese  Past Medical History  Diagnosis Date  . Hypertension   . History of breast cancer     right   . History of colonic polyps     Dr Collene Mares  . History of chemotherapy   . History of radiation therapy   . History of bronchitis   . Anxiety   . GERD (gastroesophageal reflux disease)   . Arthritis   . Cancer [facility])     breast cancer- right   . Bulging lumbar disc     2  . Restless leg syndrome     HPI:    Madeline Chandler, 54 y.o. female, has a history of pain and functional disability in the right hip(s) due to arthritis and AVN and patient has failed non-surgical conservative treatments for greater than 12 weeks to include NSAID's and/or analgesics, use of assistive devices and activity modification. Onset of symptoms was gradual starting 6 months ago with rapidlly worsening course since that time.The patient noted no past surgery on the bilaterally hip(s). Patient currently rates pain in the right hip at 10 out of 10 with activity. Patient has night pain, worsening of pain with activity and weight bearing, trendelenberg gait, pain that interfers with activities of daily living and pain with passive range of motion. Patient has evidence of periarticular osteophytes, joint space narrowing and AVN by MRI by imaging studies. This condition presents safety issues increasing the risk of falls. There is no current active infection. Risks, benefits and expectations were discussed with the patient. Risks including but not limited to the risk of anesthesia, blood clots, nerve  damage, blood vessel damage, failure of the prosthesis, infection and up to and including death. Patient understand the risks, benefits and expectations and wishes to proceed with surgery.   PCP: Leeanne Rio, PA-C   Discharged Condition: good  Hospital Course:  Patient underwent the above stated procedure on 05/12/2015. Patient tolerated the procedure well and brought to the recovery room in good condition and subsequently to the floor.  POD #1 BP: 131/76 ; Pulse: 63 ; Temp: 97.9 F (36.6 C) ; Resp: 16 Patient reports pain as mild, pain controlled. No events throughout the night. Dorsiflexion/plantar flexion intact, incision: dressing C/D/I, no cellulitis present and compartment soft.   LABS  Basename    HGB  10.5  HCT  32.1   POD #2  BP: 119/68 ; Pulse: 71 ; Temp: 97.8 F (36.6 C) ; Resp: 16 Patient reports pain as mild, pain controlled. No events throughout the night. Very happy with having had a spinal and no N&V post-op, which has happened previously. Ready to be discharged home. Dorsiflexion/plantar flexion intact, incision: dressing C/D/I, no cellulitis present and compartment soft.   LABS  Basename    HGB  9.5  HCT  29.6    Discharge Exam: General appearance: alert, cooperative and no distress Extremities: Homans sign is negative, no sign of DVT, no edema, redness or tenderness in the calves or thighs and no ulcers, gangrene or trophic changes  Disposition: Home with follow up in 2 weeks  Follow-up Information    Follow up with Mauri Pole, MD. Schedule an appointment as soon as possible for a visit in 2 weeks.   Specialty:  Orthopedic Surgery   Contact information:   [address] [place] [address] [phone]       Follow up with [facility].   Why:  home health physical therapy   Contact information:   [place] [address] [phone]       Follow up with [facility].   Why:  3n1 and rolling walker   Contact information:   [address] [place] [address] [phone]       Discharge Instructions    Call MD / Call 911    Complete by:  As directed   If you experience chest pain or shortness of breath, CALL 911 and be transported to the hospital emergency room.  If you develope a fever above 101 F, pus (white drainage) or increased drainage or redness at the wound, or calf pain, call your surgeon's office.     Change dressing    Complete by:  As directed   Maintain surgical dressing until follow up in the clinic. If the edges start to pull up, may reinforce with tape. If the dressing is no longer working, may remove and cover with gauze and tape, but must keep the area dry and clean.  Call with any questions or concerns.     Constipation Prevention    Complete by:  As directed   Drink plenty of fluids.  Prune juice may be helpful.  You may use a stool softener, such as Colace (over the counter) 100 mg twice a day.  Use MiraLax (over the counter) for constipation as needed.     Diet - low sodium heart healthy    Complete by:  As directed      Discharge instructions    Complete by:  As directed   Maintain surgical dressing until follow up in the clinic. If the edges start to pull up, may reinforce with tape. If the dressing is no longer working, may remove and cover with gauze and tape, but must keep the area dry and clean.  Follow up in 2 weeks at [facility]. Call with any questions or concerns.     Increase activity slowly as tolerated    Complete by:  As directed   Weight bearing as tolerated with assist device (walker, cane, etc) as directed, use it as long as suggested by your surgeon or therapist, typically at least 4-6 weeks.     TED hose    Complete by:  As directed   Use stockings (TED hose) for 2 weeks on both leg(s).  You may remove them at night for sleeping.             Medication List    STOP taking these  medications        HYDROcodone-acetaminophen 5-325 MG tablet  Commonly known as:  NORCO  Replaced by:  HYDROcodone-acetaminophen 7.5-325 MG tablet     meloxicam 15 MG tablet  Commonly known as:  MOBIC      TAKE these medications        aspirin 325 MG EC tablet  Take 1 tablet (325 mg total) by mouth 2 (two) times daily.     carvedilol 25 MG tablet  Commonly known as:  COREG  Take 1 tablet (25 mg total)  by mouth 2 (two) times daily with a meal.     citalopram 40 MG tablet  Commonly known as:  CELEXA  Take 1 tablet (40 mg total) by mouth daily.     cyclobenzaprine 10 MG tablet  Commonly known as:  FLEXERIL  Take 1 tablet (10 mg total) by mouth 3 (three) times daily as needed for muscle spasms.     docusate sodium 100 MG capsule  Commonly known as:  COLACE  Take 1 capsule (100 mg total) by mouth 2 (two) times daily.     ferrous sulfate 325 (65 FE) MG tablet  Take 1 tablet (325 mg total) by mouth 3 (three) times daily after meals.     hydrochlorothiazide 25 MG tablet  Commonly known as:  HYDRODIURIL  TAKE ONE TABLET BY MOUTH ONCE DAILY     HYDROcodone-acetaminophen 7.5-325 MG tablet  Commonly known as:  NORCO  Take 1-2 tablets by mouth every 4 (four) hours as needed for moderate pain.     losartan 100 MG tablet  Commonly known as:  COZAAR  Take 1 tablet (100 mg total) by mouth daily.     pantoprazole 40 MG tablet  Commonly known as:  PROTONIX  Take 1 tablet (40 mg total) by mouth daily.     polyethylene glycol packet  Commonly known as:  MIRALAX / GLYCOLAX  Take 17 g by mouth 2 (two) times daily.         Signed: West Pugh. Analyah Mcconnon   PA-C  05/18/2015, 10:08 AM

## 2015-05-19 ENCOUNTER — Telehealth: Payer: Self-pay

## 2015-05-19 NOTE — Telephone Encounter (Signed)
Pt in agreement with sleep study.  She says the only draw back is she recently had right hip surgery and was told not to drive for 6 weeks (she's not sure if she can ride in a car yet either).  She also will be having surgery on left hip on 06/23/15.  She says as long as we can work around that, she's fine with it.

## 2015-05-19 NOTE — Telephone Encounter (Signed)
Pt called back. Best # [phone].

## 2015-05-19 NOTE — Telephone Encounter (Signed)
Message From: Brunetta Jeans, PA-C 05/19/2015 1:09 PM    Patient scored at high risk for OSA before her anesthesia at recent surgery. Recommend she let us set her up with Pulmonology for assessment for sleep study. Please contact patient to see if she is willing to let me set this up. I highly recommend it.  Cody    Left a message for call back.

## 2015-05-20 NOTE — Telephone Encounter (Signed)
Nothing that has to be done immediately. I will make a note in our EMR to remind me to set this up for December or January so that will give her some healing time from surgery. I will be calling her in about a month to discuss setting this up.

## 2015-06-10 NOTE — H&P (Signed)
TOTAL HIP ADMISSION H&P  Patient is admitted for left total hip arthroplasty, anterior approach.  Subjective:  Chief Complaint:     Left hip primary AVN / pain  HPI: Madeline Chandler, 54 y.o. female, has a history of pain and functional disability in the left hip(s) due to arthritis and AVN and patient has failed non-surgical conservative treatments for greater than 12 weeks to include NSAID's and/or analgesics, use of assistive devices and activity modification.  Onset of symptoms was gradual starting 8 months ago with rapidlly worsening course since that time.The patient noted prior procedures of the hip to include arthroplasty on the right hip per Dr. Alvan Dame on 05/12/2015.  Patient currently rates pain in the left hip at 10 out of 10 with activity. Patient has night pain, worsening of pain with activity and weight bearing, trendelenberg gait, pain that interfers with activities of daily living and pain with passive range of motion. Patient has evidence of periarticular osteophytes, joint space narrowing and AVN per MRI by imaging studies. This condition presents safety issues increasing the risk of falls.   There is no current active infection.  Risks, benefits and expectations were discussed with the patient.  Risks including but not limited to the risk of anesthesia, blood clots, nerve damage, blood vessel damage, failure of the prosthesis, infection and up to and including death.  Patient understand the risks, benefits and expectations and wishes to proceed with surgery.   PCP: Leeanne Rio, PA-C  D/C Plans:      Home with HHPT  Post-op Meds:       No Rx given  Tranexamic Acid:      To be given - IV   Decadron:      Is to be given  FYI:     ASA post-op  Norco post-op    Patient Active Problem List   Diagnosis Date Noted  . Obese 05/13/2015  . S/P right THA, AA 05/12/2015  . Avascular necrosis of bones of both hips ([place]) 04/05/2015  . Essential hypertension 02/27/2015  . Muscle  spasm 02/27/2015  . Pain in joint, shoulder region 09/05/2014  . Visit for preventive health examination 12/12/2013  . Leg swelling 12/08/2013  . Other abnormal glucose 02/23/2012  . Nonspecific abnormal electrocardiogram (ECG) (EKG) 02/23/2012  . GERD 10/23/2009  . ANXIETY STATE, UNSPECIFIED 09/02/2009  . DEPRESSION 07/10/2008  . BREAST CANCER, HX OF 07/10/2008  . COLONIC POLYPS, HX OF 07/10/2008  . FIBROIDS, UTERUS 02/15/2008  . MIGRAINE HEADACHE 02/15/2008  . DILATION AND CURETTAGE, HX OF 02/15/2008  . Unspecified essential hypertension 09/25/2007   Past Medical History  Diagnosis Date  . Hypertension   . History of breast cancer     right   . History of colonic polyps     Dr Collene Mares  . History of chemotherapy   . History of radiation therapy   . History of bronchitis   . Anxiety   . GERD (gastroesophageal reflux disease)   . Arthritis   . Cancer [facility])     breast cancer- right   . Bulging lumbar disc     2  . Restless leg syndrome     Past Surgical History  Procedure Laterality Date  . Dilation and curettage of uterus    . Breast lumpectomy  1997  . Colonoscopy w/ polypectomy  2010    Dr Collene Mares  . Abdominal hysterectomy    . Total hip arthroplasty Right 05/12/2015    Procedure: RIGHT TOTAL HIP  ARTHROPLASTY ANTERIOR APPROACH;  Surgeon: Paralee Cancel, MD;  Location: [facility] ORS;  Service: Orthopedics;  Laterality: Right;    No prescriptions prior to admission   No Known Allergies  Social History  Substance Use Topics  . Smoking status: Never Smoker   . Smokeless tobacco: Never Used  . Alcohol Use: No    Family History  Problem Relation Age of Onset  . Hypertension Mother   . Diabetes Mother   . Hypertension Sister   . Hypertension Brother     X 3  . Stroke Neg Hx   . Heart disease Neg Hx   . Breast cancer Mother      Review of Systems  Constitutional: Negative.   Eyes: Negative.   Respiratory: Negative.   Cardiovascular: Negative.   Gastrointestinal:  Positive for heartburn.  Genitourinary: Negative.   Musculoskeletal: Positive for back pain and joint pain.  Skin: Negative.   Neurological: Positive for headaches.  Endo/Heme/Allergies: Negative.   Psychiatric/Behavioral: Positive for depression. The patient is nervous/anxious.     Objective:  Physical Exam  Constitutional: She is oriented to person, place, and time. She appears well-developed and well-nourished.  HENT:  Head: Normocephalic.  Eyes: Pupils are equal, round, and reactive to light.  Neck: Neck supple. No JVD present. No tracheal deviation present. No thyromegaly present.  Cardiovascular: Normal rate, regular rhythm, normal heart sounds and intact distal pulses.   Respiratory: Effort normal and breath sounds normal. No stridor. No respiratory distress. She has no wheezes.  GI: Soft. There is no tenderness. There is no guarding.  Musculoskeletal:       Left hip: She exhibits decreased range of motion, decreased strength, tenderness and bony tenderness. She exhibits no swelling, no deformity and no laceration.  Lymphadenopathy:    She has no cervical adenopathy.  Neurological: She is alert and oriented to person, place, and time.  Skin: Skin is warm and dry.  Psychiatric: She has a normal mood and affect.      Labs:  Estimated body mass index is 34.96 kg/(m^2) as calculated from the following:   Height as of 04/03/15: 5' 6.25" (1.683 m).   Weight as of 05/12/15: 99.026 kg (218 lb 5 oz).   Imaging Review Plain radiographs demonstrate severe degenerative joint disease of the left hip.  MRI reveal severe AVN of the left hip.  The bone quality appears to be good for age and reported activity level.  Assessment/Plan:  End stage arthritis, left hip(s)  The patient history, physical examination, clinical judgement of the provider and imaging studies are consistent with end stage degenerative joint disease of the left hip(s) and total hip arthroplasty is deemed  medically necessary. The treatment options including medical management, injection therapy, arthroscopy and arthroplasty were discussed at length. The risks and benefits of total hip arthroplasty were presented and reviewed. The risks due to aseptic loosening, infection, stiffness, dislocation/subluxation,  thromboembolic complications and other imponderables were discussed.  The patient acknowledged the explanation, agreed to proceed with the plan and consent was signed. Patient is being admitted for inpatient treatment for surgery, pain control, PT, OT, prophylactic antibiotics, VTE prophylaxis, progressive ambulation and ADL's and discharge planning.The patient is planning to be discharged home with home health services.    West Pugh Zafirah Vanzee   PA-C  06/10/2015, 11:34 AM

## 2015-06-16 ENCOUNTER — Encounter (HOSPITAL_COMMUNITY)
Admission: RE | Admit: 2015-06-16 | Discharge: 2015-06-16 | Disposition: A | Discharge status: Home / Self Care | Payer: 59 | Source: Ambulatory Visit | Attending: Orthopedic Surgery | Admitting: Orthopedic Surgery

## 2015-06-16 ENCOUNTER — Encounter (HOSPITAL_COMMUNITY): Payer: Self-pay

## 2015-06-16 DIAGNOSIS — Z01818 Encounter for other preprocedural examination: Secondary | ICD-10-CM | POA: Diagnosis present

## 2015-06-16 DIAGNOSIS — M87052 Idiopathic aseptic necrosis of left femur: Secondary | ICD-10-CM | POA: Insufficient documentation

## 2015-06-16 LAB — PROTIME-INR
INR: 1.04 (ref 0.00–1.49)
PROTHROMBIN TIME: 13.8 s (ref 11.6–15.2)

## 2015-06-16 LAB — CBC
HCT: 35.4 % — ABNORMAL LOW (ref 36.0–46.0)
Hemoglobin: 11.2 g/dL — ABNORMAL LOW (ref 12.0–15.0)
MCH: 28 pg (ref 26.0–34.0)
MCHC: 31.6 g/dL (ref 30.0–36.0)
MCV: 88.5 fL (ref 78.0–100.0)
Platelets: 277 10*3/uL (ref 150–400)
RBC: 4 MIL/uL (ref 3.87–5.11)
RDW: 14.3 % (ref 11.5–15.5)
WBC: 6.1 10*3/uL (ref 4.0–10.5)

## 2015-06-16 LAB — URINALYSIS, ROUTINE W REFLEX MICROSCOPIC
BILIRUBIN URINE: NEGATIVE
Glucose, UA: NEGATIVE mg/dL
Hgb urine dipstick: NEGATIVE
Ketones, ur: NEGATIVE mg/dL
Leukocytes, UA: NEGATIVE
NITRITE: NEGATIVE
Protein, ur: NEGATIVE mg/dL
SPECIFIC GRAVITY, URINE: 1.031 — AB (ref 1.005–1.030)
UROBILINOGEN UA: 0.2 mg/dL (ref 0.0–1.0)
pH: 6 (ref 5.0–8.0)

## 2015-06-16 LAB — BASIC METABOLIC PANEL
Anion gap: 8 (ref 5–15)
BUN: 15 mg/dL (ref 6–20)
CALCIUM: 9.9 mg/dL (ref 8.9–10.3)
CO2: 28 mmol/L (ref 22–32)
CREATININE: 0.89 mg/dL (ref 0.44–1.00)
Chloride: 103 mmol/L (ref 101–111)
GFR calc Af Amer: >60 mL/min (ref >60)
GFR calc non Af Amer: >60 mL/min (ref >60)
GLUCOSE: 90 mg/dL (ref 65–99)
Potassium: 3.6 mmol/L (ref 3.5–5.1)
Sodium: 139 mmol/L (ref 135–145)

## 2015-06-16 LAB — SURGICAL PCR SCREEN
MRSA, PCR: NEGATIVE
STAPHYLOCOCCUS AUREUS: POSITIVE — AB

## 2015-06-16 LAB — APTT: aPTT: 84 seconds — ABNORMAL HIGH (ref 24–37)

## 2015-06-16 NOTE — Progress Notes (Signed)
Surgical consent orders noting right hip but surgery is scheduled for left hip. Please clarify. Thanks.

## 2015-06-16 NOTE — Progress Notes (Signed)
PTT results in epic per PAT visit 06/16/2015 sent to Dr Alvan Dame

## 2015-06-16 NOTE — Patient Instructions (Signed)
Madeline Chandler  06/16/2015   Your procedure is scheduled on: Tuesday June 23, 2015   Report to [facility] Main  Entrance take [place]  elevators to 3rd floor to  [place] at 8:30  AM.  Call this number if you have problems the morning of surgery [phone]   Remember: ONLY 1 PERSON MAY GO WITH YOU TO SHORT STAY TO GET  READY MORNING OF [name].  Do not eat food or drink liquids :After Midnight.     Take these medicines the morning of surgery with A SIP OF WATER: Carvedilol (Coreg); Hydrocodone - Acetaminophen if needed; Pantoprazole (Protonix)              You may not have any metal on your body including hair pins and              piercings  Do not wear jewelry, make-up, lotions, powders or perfumes, deodorant             Do not wear nail polish.  Do not shave  48 hours prior to surgery.                Do not bring valuables to the hospital. Madeline Chandler.  Contacts, dentures or bridgework may not be worn into surgery.  Leave suitcase in the car. After surgery it may be brought to your room.                Please read over the following fact sheets you were given:MRSA INFORMATION SHEET; INCENTIVE SPIROMETER; BLOOD TRANSFUSION INFORMATION SHEET _____________________________________________________________________             [name] - Preparing for Surgery Before surgery, you can play an important role.  Because skin is not sterile, your skin needs to be as free of germs as possible.  You can reduce the number of germs on your skin by washing with CHG (chlorahexidine gluconate) soap before surgery.  CHG is an antiseptic cleaner which kills germs and bonds with the skin to continue killing germs even after washing. Please DO NOT use if you have an allergy to CHG or antibacterial soaps.  If your skin becomes reddened/irritated stop using the CHG and inform your nurse when you arrive at Short  Stay. Do not shave (including legs and underarms) for at least 48 hours prior to the first CHG shower.  You may shave your face/neck. Please follow these instructions carefully:  1.  Shower with CHG Soap the night before surgery and the  morning of Surgery.  2.  If you choose to wash your hair, wash your hair first as usual with your  normal  shampoo.  3.  After you shampoo, rinse your hair and body thoroughly to remove the  shampoo.                           4.  Use CHG as you would any other liquid soap.  You can apply chg directly  to the skin and wash                       Gently with a scrungie or clean washcloth.  5.  Apply the CHG Soap to your body ONLY FROM  THE NECK DOWN.   Do not use on face/ open                           Wound or open sores. Avoid contact with eyes, ears mouth and genitals (private parts).                       Wash face,  Genitals (private parts) with your normal soap.             6.  Wash thoroughly, paying special attention to the area where your surgery  will be performed.  7.  Thoroughly rinse your body with warm water from the neck down.  8.  DO NOT shower/wash with your normal soap after using and rinsing off  the CHG Soap.                9.  Pat yourself dry with a clean towel.            10.  Wear clean pajamas.            11.  Place clean sheets on your bed the night of your first shower and do not  sleep with pets. Day of Surgery : Do not apply any lotions/deodorants the morning of surgery.  Please wear clean clothes to the hospital/surgery center.  FAILURE TO FOLLOW THESE INSTRUCTIONS MAY RESULT IN THE CANCELLATION OF YOUR SURGERY PATIENT SIGNATURE_________________________________  NURSE SIGNATURE__________________________________  ________________________________________________________________________   Madeline Chandler  An incentive spirometer is a tool that can help keep your lungs clear and active. This tool measures how well you are  filling your lungs with each breath. Taking long deep breaths may help reverse or decrease the chance of developing breathing (pulmonary) problems (especially infection) following:  A long period of time when you are unable to move or be active. BEFORE THE PROCEDURE   If the spirometer includes an indicator to show your best effort, your nurse or respiratory therapist will set it to a desired goal.  If possible, sit up straight or lean slightly forward. Try not to slouch.  Hold the incentive spirometer in an upright position. INSTRUCTIONS FOR USE   Sit on the edge of your bed if possible, or sit up as far as you can in bed or on a chair.  Hold the incentive spirometer in an upright position.  Breathe out normally.  Place the mouthpiece in your mouth and seal your lips tightly around it.  Breathe in slowly and as deeply as possible, raising the piston or the ball toward the top of the column.  Hold your breath for 3-5 seconds or for as long as possible. Allow the piston or ball to fall to the bottom of the column.  Remove the mouthpiece from your mouth and breathe out normally.  Rest for a few seconds and repeat Steps 1 through 7 at least 10 times every 1-2 hours when you are awake. Take your time and take a few normal breaths between deep breaths.  The spirometer may include an indicator to show your best effort. Use the indicator as a goal to work toward during each repetition.  After each set of 10 deep breaths, practice coughing to be sure your lungs are clear. If you have an incision (the cut made at the time of surgery), support your incision when coughing by placing a pillow or rolled up towels firmly against it. Once you are  able to get out of bed, walk around indoors and cough well. You may stop using the incentive spirometer when instructed by your caregiver.  RISKS AND COMPLICATIONS  Take your time so you do not get dizzy or light-headed.  If you are in pain, you may  need to take or ask for pain medication before doing incentive spirometry. It is harder to take a deep breath if you are having pain. AFTER USE  Rest and breathe slowly and easily.  It can be helpful to keep track of a log of your progress. Your caregiver can provide you with a simple table to help with this. If you are using the spirometer at home, follow these instructions: [place] IF:   You are having difficultly using the spirometer.  You have trouble using the spirometer as often as instructed.  Your pain medication is not giving enough relief while using the spirometer.  You develop fever of 100.5 F (38.1 C) or higher. SEEK IMMEDIATE MEDICAL CARE IF:   You cough up bloody sputum that had not been present before.  You develop fever of 102 F (38.9 C) or greater.  You develop worsening pain at or near the incision site. MAKE SURE YOU:   Understand these instructions.  Will watch your condition.  Will get help right away if you are not doing well or get worse. Document Released: 12/05/2006 Document Revised: 10/17/2011 Document Reviewed: 02/05/2007 Exit[employer] Patient Information 2014 [employer], [place].   ________________________________________________________________________  WHAT IS A BLOOD TRANSFUSION? Blood Transfusion Information  A transfusion is the replacement of blood or some of its parts. Blood is made up of multiple cells which provide different functions.  Red blood cells carry oxygen and are used for blood loss replacement.  White blood cells fight against infection.  Platelets control bleeding.  Plasma helps clot blood.  Other blood products are available for specialized needs, such as hemophilia or other clotting disorders. BEFORE THE TRANSFUSION  Who gives blood for transfusions?   Healthy volunteers who are fully evaluated to make sure their blood is safe. This is blood bank blood. Transfusion therapy is the safest it has ever been in  the practice of medicine. Before blood is taken from a donor, a complete history is taken to make sure that person has no history of diseases nor engages in risky social behavior (examples are intravenous drug use or sexual activity with multiple partners). The donor's travel history is screened to minimize risk of transmitting infections, such as malaria. The donated blood is tested for signs of infectious diseases, such as HIV and hepatitis. The blood is then tested to be sure it is compatible with you in order to minimize the chance of a transfusion reaction. If you or a relative donates blood, this is often done in anticipation of surgery and is not appropriate for emergency situations. It takes many days to process the donated blood. RISKS AND COMPLICATIONS Although transfusion therapy is very safe and saves many lives, the main dangers of transfusion include:   Getting an infectious disease.  Developing a transfusion reaction. This is an allergic reaction to something in the blood you were given. Every precaution is taken to prevent this. The decision to have a blood transfusion has been considered carefully by your caregiver before blood is given. Blood is not given unless the benefits outweigh the risks. AFTER THE TRANSFUSION  Right after receiving a blood transfusion, you will usually feel much better and more energetic. This is especially  true if your red blood cells have gotten low (anemic). The transfusion raises the level of the red blood cells which carry oxygen, and this usually causes an energy increase.  The nurse administering the transfusion will monitor you carefully for complications. HOME CARE INSTRUCTIONS  No special instructions are needed after a transfusion. You may find your energy is better. Speak with your caregiver about any limitations on activity for underlying diseases you may have. SEEK MEDICAL CARE IF:   Your condition is not improving after your  transfusion.  You develop redness or irritation at the intravenous (IV) site. SEEK IMMEDIATE MEDICAL CARE IF:  Any of the following symptoms occur over the next 12 hours:  Shaking chills.  You have a temperature by mouth above 102 F (38.9 C), not controlled by medicine.  Chest, back, or muscle pain.  People around you feel you are not acting correctly or are confused.  Shortness of breath or difficulty breathing.  Dizziness and fainting.  You get a rash or develop hives.  You have a decrease in urine output.  Your urine turns a dark color or changes to pink, red, or brown. Any of the following symptoms occur over the next 10 days:  You have a temperature by mouth above 102 F (38.9 C), not controlled by medicine.  Shortness of breath.  Weakness after normal activity.  The white part of the eye turns yellow (jaundice).  You have a decrease in the amount of urine or are urinating less often.  Your urine turns a dark color or changes to pink, red, or brown. Document Released: 07/22/2000 Document Revised: 10/17/2011 Document Reviewed: 03/10/2008 [facility] Patient Information 2014 [place], [place].  _______________________________________________________________________

## 2015-06-16 NOTE — Progress Notes (Signed)
Your patient has screened at an elevated risk for Obstructive Sleep Apnea using the Stop-Bang Tool during a pre-surgical visit. Patient scored at high risk.  

## 2015-06-16 NOTE — Progress Notes (Signed)
EKG/epic 05/06/2015

## 2015-06-17 NOTE — Progress Notes (Signed)
Surgical screening results in epic per PAT visit 06/16/2015 positive for STAPH. Results sent to Dr Alvan Dame. Pt aware. Pt states has Mupuriocin Ointment in home and stated has not expired. Pt aware of instruction for use.

## 2015-06-23 ENCOUNTER — Inpatient Hospital Stay (HOSPITAL_COMMUNITY): Payer: 59

## 2015-06-23 ENCOUNTER — Encounter (HOSPITAL_COMMUNITY): Payer: Self-pay | Admitting: *Deleted

## 2015-06-23 ENCOUNTER — Inpatient Hospital Stay (HOSPITAL_COMMUNITY): Payer: 59 | Admitting: Registered Nurse

## 2015-06-23 ENCOUNTER — Inpatient Hospital Stay (HOSPITAL_COMMUNITY)
Admission: RE | Admit: 2015-06-23 | Discharge: 2015-06-25 | DRG: 470 | Disposition: A | Discharge status: Home Health | Payer: 59 | Source: Ambulatory Visit | Attending: Orthopedic Surgery | Admitting: Orthopedic Surgery

## 2015-06-23 ENCOUNTER — Encounter (HOSPITAL_COMMUNITY)
Admission: RE | Disposition: A | Discharge status: Home Health | Payer: Self-pay | Source: Ambulatory Visit | Attending: Orthopedic Surgery

## 2015-06-23 DIAGNOSIS — I10 Essential (primary) hypertension: Secondary | ICD-10-CM | POA: Diagnosis present

## 2015-06-23 DIAGNOSIS — Z96641 Presence of right artificial hip joint: Secondary | ICD-10-CM | POA: Diagnosis present

## 2015-06-23 DIAGNOSIS — Z96649 Presence of unspecified artificial hip joint: Secondary | ICD-10-CM

## 2015-06-23 DIAGNOSIS — F419 Anxiety disorder, unspecified: Secondary | ICD-10-CM | POA: Diagnosis present

## 2015-06-23 DIAGNOSIS — Z923 Personal history of irradiation: Secondary | ICD-10-CM | POA: Diagnosis not present

## 2015-06-23 DIAGNOSIS — Z853 Personal history of malignant neoplasm of breast: Secondary | ICD-10-CM | POA: Diagnosis not present

## 2015-06-23 DIAGNOSIS — G2581 Restless legs syndrome: Secondary | ICD-10-CM | POA: Diagnosis present

## 2015-06-23 DIAGNOSIS — M87052 Idiopathic aseptic necrosis of left femur: Principal | ICD-10-CM | POA: Diagnosis present

## 2015-06-23 DIAGNOSIS — Z9221 Personal history of antineoplastic chemotherapy: Secondary | ICD-10-CM

## 2015-06-23 DIAGNOSIS — F411 Generalized anxiety disorder: Secondary | ICD-10-CM | POA: Diagnosis present

## 2015-06-23 DIAGNOSIS — E669 Obesity, unspecified: Secondary | ICD-10-CM | POA: Diagnosis present

## 2015-06-23 DIAGNOSIS — M62838 Other muscle spasm: Secondary | ICD-10-CM

## 2015-06-23 DIAGNOSIS — Z6834 Body mass index (BMI) 34.0-34.9, adult: Secondary | ICD-10-CM

## 2015-06-23 DIAGNOSIS — Z01812 Encounter for preprocedural laboratory examination: Secondary | ICD-10-CM

## 2015-06-23 DIAGNOSIS — K219 Gastro-esophageal reflux disease without esophagitis: Secondary | ICD-10-CM | POA: Diagnosis present

## 2015-06-23 DIAGNOSIS — M879 Osteonecrosis, unspecified: Secondary | ICD-10-CM | POA: Diagnosis present

## 2015-06-23 HISTORY — PX: TOTAL HIP ARTHROPLASTY: SHX124

## 2015-06-23 LAB — TYPE AND SCREEN
ABO/RH(D): O NEG
Antibody Screen: NEGATIVE

## 2015-06-23 SURGERY — ARTHROPLASTY, HIP, TOTAL, ANTERIOR APPROACH
Anesthesia: General | Site: Hip | Laterality: Left

## 2015-06-23 MED ORDER — PHENOL 1.4 % MT LIQD: 1.0000 | OROMUCOSAL | Status: DC | PRN

## 2015-06-23 MED ORDER — FENTANYL CITRATE (PF) 100 MCG/2ML IJ SOLN
INTRAMUSCULAR | Status: AC
  Filled 2015-06-23: qty 4

## 2015-06-23 MED ORDER — ONDANSETRON HCL 4 MG PO TABS: 4.0000 mg | ORAL_TABLET | Freq: Four times a day (QID) | ORAL | Status: DC | PRN

## 2015-06-23 MED ORDER — FENTANYL CITRATE (PF) 100 MCG/2ML IJ SOLN
INTRAMUSCULAR | Status: AC
  Filled 2015-06-23: qty 2

## 2015-06-23 MED ORDER — HYDROCODONE-ACETAMINOPHEN 7.5-325 MG PO TABS
1.0000 | ORAL_TABLET | ORAL | Status: DC
  Administered 2015-06-23 – 2015-06-25 (×10): 2 via ORAL
  Administered 2015-06-25: 1 via ORAL
  Filled 2015-06-23 (×11): qty 2

## 2015-06-23 MED ORDER — PANTOPRAZOLE SODIUM 40 MG PO TBEC
40.0000 mg | DELAYED_RELEASE_TABLET | Freq: Every day | ORAL | Status: DC
  Administered 2015-06-23 – 2015-06-25 (×3): 40 mg via ORAL
  Filled 2015-06-23 (×3): qty 1

## 2015-06-23 MED ORDER — FENTANYL CITRATE (PF) 100 MCG/2ML IJ SOLN
25.0000 ug | INTRAMUSCULAR | Status: DC | PRN
  Administered 2015-06-23: 50 ug via INTRAVENOUS

## 2015-06-23 MED ORDER — ONDANSETRON HCL 4 MG/2ML IJ SOLN: 4.0000 mg | Freq: Four times a day (QID) | INTRAMUSCULAR | Status: DC | PRN

## 2015-06-23 MED ORDER — SUFENTANIL 5MCG/ML (10ML) SYRINGE FOR MEDFUSION PUMP - OPTIME
INTRAVENOUS | Status: DC | PRN
  Administered 2015-06-23: 10 ug via INTRAVENOUS
  Administered 2015-06-23: 20 ug via INTRAVENOUS
  Administered 2015-06-23 (×2): 10 ug via INTRAVENOUS

## 2015-06-23 MED ORDER — POLYETHYLENE GLYCOL 3350 17 G PO PACK
17.0000 g | PACK | Freq: Two times a day (BID) | ORAL | Status: DC
  Administered 2015-06-23 – 2015-06-25 (×5): 17 g via ORAL

## 2015-06-23 MED ORDER — LIDOCAINE HCL (CARDIAC) 20 MG/ML IV SOLN
INTRAVENOUS | Status: DC | PRN
  Administered 2015-06-23: 100 mg via INTRAVENOUS

## 2015-06-23 MED ORDER — SUGAMMADEX SODIUM 200 MG/2ML IV SOLN
INTRAVENOUS | Status: DC | PRN
  Administered 2015-06-23: 200 mg via INTRAVENOUS

## 2015-06-23 MED ORDER — MIDAZOLAM HCL 2 MG/2ML IJ SOLN
INTRAMUSCULAR | Status: AC
Start: 2015-06-23 — End: 2015-06-23
  Filled 2015-06-23: qty 4

## 2015-06-23 MED ORDER — CITALOPRAM HYDROBROMIDE 40 MG PO TABS
40.0000 mg | ORAL_TABLET | Freq: Every day | ORAL | Status: DC
  Administered 2015-06-23 – 2015-06-24 (×2): 40 mg via ORAL
  Filled 2015-06-23 (×3): qty 1

## 2015-06-23 MED ORDER — SODIUM CHLORIDE 0.9 % IV SOLN
100.0000 mL/h | INTRAVENOUS | Status: DC
  Administered 2015-06-23: 100 mL/h via INTRAVENOUS
  Filled 2015-06-23 (×7): qty 1000

## 2015-06-23 MED ORDER — ONDANSETRON HCL 4 MG/2ML IJ SOLN
INTRAMUSCULAR | Status: DC | PRN
  Administered 2015-06-23: 4 mg via INTRAVENOUS

## 2015-06-23 MED ORDER — HYDROMORPHONE HCL 2 MG/ML IJ SOLN
INTRAMUSCULAR | Status: AC
  Filled 2015-06-23: qty 1

## 2015-06-23 MED ORDER — METOCLOPRAMIDE HCL 10 MG PO TABS: 5.0000 mg | ORAL_TABLET | Freq: Three times a day (TID) | ORAL | Status: DC | PRN

## 2015-06-23 MED ORDER — PROPOFOL 10 MG/ML IV BOLUS
INTRAVENOUS | Status: AC
  Filled 2015-06-23: qty 20

## 2015-06-23 MED ORDER — HYDROMORPHONE HCL 1 MG/ML IJ SOLN
0.5000 mg | INTRAMUSCULAR | Status: DC | PRN
  Administered 2015-06-23: 0.5 mg via INTRAVENOUS
  Administered 2015-06-23: 1 mg via INTRAVENOUS
  Filled 2015-06-23 (×2): qty 1

## 2015-06-23 MED ORDER — ASPIRIN EC 325 MG PO TBEC
325.0000 mg | DELAYED_RELEASE_TABLET | Freq: Two times a day (BID) | ORAL | Status: DC
  Administered 2015-06-24 – 2015-06-25 (×3): 325 mg via ORAL
  Filled 2015-06-23 (×5): qty 1

## 2015-06-23 MED ORDER — ONDANSETRON HCL 4 MG/2ML IJ SOLN
INTRAMUSCULAR | Status: AC
  Filled 2015-06-23: qty 2

## 2015-06-23 MED ORDER — CELECOXIB 200 MG PO CAPS
200.0000 mg | ORAL_CAPSULE | Freq: Two times a day (BID) | ORAL | Status: DC
  Administered 2015-06-23 – 2015-06-25 (×5): 200 mg via ORAL
  Filled 2015-06-23 (×7): qty 1

## 2015-06-23 MED ORDER — EPHEDRINE SULFATE 50 MG/ML IJ SOLN
INTRAMUSCULAR | Status: DC | PRN
  Administered 2015-06-23 (×2): 5 mg via INTRAVENOUS

## 2015-06-23 MED ORDER — MENTHOL 3 MG MT LOZG: 1.0000 | LOZENGE | OROMUCOSAL | Status: DC | PRN

## 2015-06-23 MED ORDER — HYDROMORPHONE HCL 1 MG/ML IJ SOLN
INTRAMUSCULAR | Status: DC | PRN
  Administered 2015-06-23: .4 mg via INTRAVENOUS
  Administered 2015-06-23: .6 mg via INTRAVENOUS

## 2015-06-23 MED ORDER — SUFENTANIL CITRATE 50 MCG/ML IV SOLN
INTRAVENOUS | Status: AC
Start: 2015-06-23 — End: 2015-06-23
  Filled 2015-06-23: qty 1

## 2015-06-23 MED ORDER — METOCLOPRAMIDE HCL 5 MG/ML IJ SOLN: 5.0000 mg | Freq: Three times a day (TID) | INTRAMUSCULAR | Status: DC | PRN

## 2015-06-23 MED ORDER — LACTATED RINGERS IV SOLN
INTRAVENOUS | Status: DC
  Administered 2015-06-23: 12:00:00 via INTRAVENOUS
  Administered 2015-06-23: 1000 mL via INTRAVENOUS

## 2015-06-23 MED ORDER — CARVEDILOL 25 MG PO TABS
25.0000 mg | ORAL_TABLET | Freq: Two times a day (BID) | ORAL | Status: DC
  Administered 2015-06-23: 25 mg via ORAL
  Filled 2015-06-23 (×3): qty 1

## 2015-06-23 MED ORDER — HYDROCHLOROTHIAZIDE 25 MG PO TABS
25.0000 mg | ORAL_TABLET | Freq: Every day | ORAL | Status: DC
  Administered 2015-06-23 – 2015-06-25 (×3): 25 mg via ORAL
  Filled 2015-06-23 (×3): qty 1

## 2015-06-23 MED ORDER — DEXAMETHASONE SODIUM PHOSPHATE 10 MG/ML IJ SOLN
10.0000 mg | Freq: Once | INTRAMUSCULAR | Status: AC
  Administered 2015-06-23: 10 mg via INTRAVENOUS

## 2015-06-23 MED ORDER — LOSARTAN POTASSIUM 50 MG PO TABS
100.0000 mg | ORAL_TABLET | Freq: Every day | ORAL | Status: DC
  Administered 2015-06-23 – 2015-06-24 (×2): 100 mg via ORAL
  Filled 2015-06-23 (×3): qty 2

## 2015-06-23 MED ORDER — MAGNESIUM CITRATE PO SOLN: 1.0000 | Freq: Once | ORAL | Status: DC | PRN

## 2015-06-23 MED ORDER — SODIUM CHLORIDE 0.9 % IJ SOLN
INTRAMUSCULAR | Status: AC
  Filled 2015-06-23: qty 10

## 2015-06-23 MED ORDER — DIPHENHYDRAMINE HCL 25 MG PO CAPS: 25.0000 mg | ORAL_CAPSULE | Freq: Four times a day (QID) | ORAL | Status: DC | PRN

## 2015-06-23 MED ORDER — CHLORHEXIDINE GLUCONATE 4 % EX LIQD: 60.0000 mL | Freq: Once | CUTANEOUS | Status: DC

## 2015-06-23 MED ORDER — ACETAMINOPHEN 500 MG PO TABS
1000.0000 mg | ORAL_TABLET | Freq: Once | ORAL | Status: AC
  Administered 2015-06-23: 1000 mg via ORAL

## 2015-06-23 MED ORDER — CEFAZOLIN SODIUM-DEXTROSE 2-3 GM-% IV SOLR
2.0000 g | INTRAVENOUS | Status: AC
  Administered 2015-06-23: 2 g via INTRAVENOUS

## 2015-06-23 MED ORDER — CARVEDILOL 25 MG PO TABS
25.0000 mg | ORAL_TABLET | Freq: Two times a day (BID) | ORAL | Status: DC
  Administered 2015-06-23 – 2015-06-25 (×4): 25 mg via ORAL
  Filled 2015-06-23 (×6): qty 1

## 2015-06-23 MED ORDER — MIDAZOLAM HCL 5 MG/5ML IJ SOLN
INTRAMUSCULAR | Status: DC | PRN
  Administered 2015-06-23: 2 mg via INTRAVENOUS

## 2015-06-23 MED ORDER — ROCURONIUM 10MG/ML (10ML) SYRINGE FOR MEDFUSION PUMP - OPTIME
INTRAVENOUS | Status: DC | PRN
  Administered 2015-06-23: 50 mg via INTRAVENOUS

## 2015-06-23 MED ORDER — ACETAMINOPHEN 500 MG PO TABS
ORAL_TABLET | ORAL | Status: AC
Start: 2015-06-23 — End: 2015-06-23
  Filled 2015-06-23: qty 2

## 2015-06-23 MED ORDER — EPHEDRINE SULFATE 50 MG/ML IJ SOLN
INTRAMUSCULAR | Status: AC
  Filled 2015-06-23: qty 1

## 2015-06-23 MED ORDER — PROMETHAZINE HCL 25 MG/ML IJ SOLN: 6.2500 mg | INTRAMUSCULAR | Status: DC | PRN

## 2015-06-23 MED ORDER — METHOCARBAMOL 1000 MG/10ML IJ SOLN
500.0000 mg | Freq: Four times a day (QID) | INTRAVENOUS | Status: DC | PRN
  Administered 2015-06-23: 500 mg via INTRAVENOUS
  Filled 2015-06-23 (×2): qty 5

## 2015-06-23 MED ORDER — METHOCARBAMOL 500 MG PO TABS
500.0000 mg | ORAL_TABLET | Freq: Four times a day (QID) | ORAL | Status: DC | PRN
  Administered 2015-06-24: 500 mg via ORAL
  Filled 2015-06-23: qty 1

## 2015-06-23 MED ORDER — DOCUSATE SODIUM 100 MG PO CAPS
100.0000 mg | ORAL_CAPSULE | Freq: Two times a day (BID) | ORAL | Status: DC
  Administered 2015-06-23 – 2015-06-25 (×5): 100 mg via ORAL

## 2015-06-23 MED ORDER — CEFAZOLIN SODIUM-DEXTROSE 2-3 GM-% IV SOLR
INTRAVENOUS | Status: AC
  Filled 2015-06-23: qty 50

## 2015-06-23 MED ORDER — PROPOFOL 10 MG/ML IV BOLUS
INTRAVENOUS | Status: DC | PRN
  Administered 2015-06-23: 200 mg via INTRAVENOUS

## 2015-06-23 MED ORDER — 0.9 % SODIUM CHLORIDE (POUR BTL) OPTIME
TOPICAL | Status: DC | PRN
  Administered 2015-06-23: 1000 mL

## 2015-06-23 MED ORDER — BISACODYL 10 MG RE SUPP: 10.0000 mg | Freq: Every day | RECTAL | Status: DC | PRN

## 2015-06-23 MED ORDER — CEFAZOLIN SODIUM-DEXTROSE 2-3 GM-% IV SOLR
2.0000 g | Freq: Four times a day (QID) | INTRAVENOUS | Status: AC
  Administered 2015-06-23 (×2): 2 g via INTRAVENOUS
  Filled 2015-06-23 (×2): qty 50

## 2015-06-23 MED ORDER — FERROUS SULFATE 325 (65 FE) MG PO TABS
325.0000 mg | ORAL_TABLET | Freq: Three times a day (TID) | ORAL | Status: DC
  Administered 2015-06-23 – 2015-06-25 (×5): 325 mg via ORAL
  Filled 2015-06-23 (×8): qty 1

## 2015-06-23 MED ORDER — DEXAMETHASONE SODIUM PHOSPHATE 10 MG/ML IJ SOLN
INTRAMUSCULAR | Status: AC
  Filled 2015-06-23: qty 1

## 2015-06-23 MED ORDER — SUGAMMADEX SODIUM 200 MG/2ML IV SOLN
INTRAVENOUS | Status: AC
  Filled 2015-06-23: qty 2

## 2015-06-23 MED ORDER — TRANEXAMIC ACID 1000 MG/10ML IV SOLN
1000.0000 mg | Freq: Once | INTRAVENOUS | Status: AC
  Administered 2015-06-23: 1000 mg via INTRAVENOUS
  Filled 2015-06-23: qty 10

## 2015-06-23 MED ORDER — ALUM & MAG HYDROXIDE-SIMETH 200-200-20 MG/5ML PO SUSP: 30.0000 mL | ORAL | Status: DC | PRN

## 2015-06-23 MED ORDER — DEXAMETHASONE SODIUM PHOSPHATE 10 MG/ML IJ SOLN
10.0000 mg | Freq: Once | INTRAMUSCULAR | Status: AC
  Administered 2015-06-24: 10 mg via INTRAVENOUS
  Filled 2015-06-23: qty 1

## 2015-06-23 SURGICAL SUPPLY — 35 items
BAG DECANTER FOR FLEXI CONT (MISCELLANEOUS) IMPLANT
BAG ZIPLOCK 12X15 (MISCELLANEOUS) IMPLANT
CAPT HIP TOTAL 2 ×2 IMPLANT
CLOTH BEACON ORANGE TIMEOUT ST (SAFETY) ×2 IMPLANT
COVER PERINEAL POST (MISCELLANEOUS) ×2 IMPLANT
DRAPE STERI IOBAN 125X83 (DRAPES) ×2 IMPLANT
DRAPE U-SHAPE 47X51 STRL (DRAPES) ×4 IMPLANT
DRSG AQUACEL AG ADV 3.5X10 (GAUZE/BANDAGES/DRESSINGS) ×2 IMPLANT
DURAPREP 26ML APPLICATOR (WOUND CARE) ×2 IMPLANT
ELECT REM PT RETURN 15FT ADLT (MISCELLANEOUS) IMPLANT
ELECT REM PT RETURN 9FT ADLT (ELECTROSURGICAL) ×2
ELECTRODE REM PT RTRN 9FT ADLT (ELECTROSURGICAL) ×1 IMPLANT
GLOVE BIOGEL M 7.0 STRL (GLOVE) IMPLANT
GLOVE BIOGEL M STRL SZ7.5 (GLOVE) IMPLANT
GLOVE BIOGEL PI IND STRL 7.5 (GLOVE) ×1 IMPLANT
GLOVE BIOGEL PI IND STRL 8.5 (GLOVE) ×1 IMPLANT
GLOVE BIOGEL PI INDICATOR 7.5 (GLOVE) ×1
GLOVE BIOGEL PI INDICATOR 8.5 (GLOVE) ×1
GLOVE ECLIPSE 8.0 STRL XLNG CF (GLOVE) ×4 IMPLANT
GLOVE ORTHO TXT STRL SZ7.5 (GLOVE) ×2 IMPLANT
GOWN STRL REUS W/TWL LRG LVL3 (GOWN DISPOSABLE) ×2 IMPLANT
GOWN STRL REUS W/TWL XL LVL3 (GOWN DISPOSABLE) ×2 IMPLANT
HOLDER FOLEY CATH W/STRAP (MISCELLANEOUS) ×2 IMPLANT
LIQUID BAND (GAUZE/BANDAGES/DRESSINGS) ×2 IMPLANT
PACK ANTERIOR HIP CUSTOM (KITS) ×2 IMPLANT
SAW OSC TIP CART 19.5X105X1.3 (SAW) ×2 IMPLANT
SUT MNCRL AB 4-0 PS2 18 (SUTURE) ×2 IMPLANT
SUT VIC AB 0 CT1 36 (SUTURE) ×2 IMPLANT
SUT VIC AB 1 CT1 36 (SUTURE) ×6 IMPLANT
SUT VIC AB 2-0 CT1 27 (SUTURE) ×2
SUT VIC AB 2-0 CT1 TAPERPNT 27 (SUTURE) ×2 IMPLANT
SUT VLOC 180 0 24IN GS25 (SUTURE) ×2 IMPLANT
SYR 50ML LL SCALE MARK (SYRINGE) IMPLANT
TRAY FOLEY W/METER SILVER 14FR (SET/KITS/TRAYS/PACK) ×2 IMPLANT
WATER STERILE IRR 1500ML POUR (IV SOLUTION) ×2 IMPLANT

## 2015-06-23 NOTE — Discharge Instructions (Signed)

## 2015-06-23 NOTE — Interval H&P Note (Signed)
History and Physical Interval Note:  06/23/2015 10:03 AM  Madeline Chandler  has presented today for surgery, with the diagnosis of Left Hip AVN  The various methods of treatment have been discussed with the patient and family. After consideration of risks, benefits and other options for treatment, the patient has consented to  Procedure(s): LEFT TOTAL HIP ARTHROPLASTY ANTERIOR APPROACH (Left) as a surgical intervention .  The patient's history has been reviewed, patient examined, no change in status, stable for surgery.  I have reviewed the patient's chart and labs.  Questions were answered to the patient's satisfaction.     Mauri Pole

## 2015-06-23 NOTE — Transfer of Care (Signed)
Immediate Anesthesia Transfer of Care Note  Patient: Madeline Chandler  Procedure(s) Performed: Procedure(s): LEFT TOTAL HIP ARTHROPLASTY ANTERIOR APPROACH (Left)  Patient Location: PACU  Anesthesia Type:General  Level of Consciousness: sedated  Airway & Oxygen Therapy: Patient Spontanous Breathing and Patient connected to face mask oxygen  Post-op Assessment: Report given to RN and Post -op Vital signs reviewed and stable  Post vital signs: Reviewed and stable  Last Vitals:  Filed Vitals:   06/23/15 0926  BP:   Pulse:   Temp: 36.4 C  Resp: 20    Complications: No apparent anesthesia complications

## 2015-06-23 NOTE — Anesthesia Procedure Notes (Signed)
Procedure Name: Intubation Date/Time: 06/23/2015 11:14 AM Performed by: Danley Danker L Patient Re-evaluated:Patient Re-evaluated prior to inductionOxygen Delivery Method: Circle system utilized Preoxygenation: Pre-oxygenation with 100% oxygen Intubation Type: IV induction Ventilation: Mask ventilation without difficulty and Oral airway inserted - appropriate to patient size Laryngoscope Size: Miller and 3 Grade View: Grade I Tube type: Oral Tube size: 7.5 mm Number of attempts: 1 Airway Equipment and Method: Stylet Placement Confirmation: ETT inserted through vocal cords under direct vision,  breath sounds checked- equal and bilateral and positive ETCO2 Secured at: 22 cm Tube secured with: Tape Dental Injury: Teeth and Oropharynx as per pre-operative assessment

## 2015-06-23 NOTE — Anesthesia Preprocedure Evaluation (Addendum)
Anesthesia Evaluation  Patient identified by MRN, date of birth, ID band Patient awake    Reviewed: Allergy & Precautions, NPO status , Patient's Chart, lab work & pertinent test results, reviewed documented beta blocker date and time   History of Anesthesia Complications Negative for: history of anesthetic complications  Airway Mallampati: II  TM Distance: >3 FB Neck ROM: Full    Dental  (+) Teeth Intact   Pulmonary neg pulmonary ROS,    Pulmonary exam normal breath sounds clear to auscultation       Cardiovascular hypertension, Pt. on medications and Pt. on home beta blockers (-) angina(-) Past MI Normal cardiovascular exam Rhythm:Regular Rate:Normal     Neuro/Psych  Headaches, PSYCHIATRIC DISORDERS Anxiety Depression    GI/Hepatic Neg liver ROS, GERD  Medicated,  Endo/Other  Obesity   Renal/GU negative Renal ROS  negative genitourinary   Musculoskeletal  (+) Arthritis , Osteoarthritis,    Abdominal   Peds negative pediatric ROS (+)  Hematology negative hematology ROS (+)   Anesthesia Other Findings   Reproductive/Obstetrics                            Anesthesia Physical Anesthesia Plan  ASA: III  Anesthesia Plan: General   Post-op Pain Management:    Induction: Intravenous  Airway Management Planned: Oral ETT  Additional Equipment:   Intra-op Plan:   Post-operative Plan: Extubation in OR  Informed Consent: I have reviewed the patients History and Physical, chart, labs and discussed the procedure including the risks, benefits and alternatives for the proposed anesthesia with the patient or authorized representative who has indicated his/her understanding and acceptance.   Dental advisory given  Plan Discussed with: CRNA, Anesthesiologist and Surgeon  Anesthesia Plan Comments: (Risks/benefits of general anesthesia discussed with patient including risk of damage to teeth,  lips, gum, and tongue, nausea/vomiting, allergic reactions to medications, and the possibility of heart attack, stroke and death.  All patient questions answered.  Patient wishes to proceed.  PTT on 06/16/15 was 84.  Patient denies anti-coagulant use.  Will do GETA.)       Anesthesia Quick Evaluation

## 2015-06-23 NOTE — Anesthesia Postprocedure Evaluation (Signed)
  Anesthesia Post-op Note  Patient: Madeline Chandler  Procedure(s) Performed: Procedure(s) (LRB): LEFT TOTAL HIP ARTHROPLASTY ANTERIOR APPROACH (Left)  Patient Location: PACU  Anesthesia Type: General  Level of Consciousness: awake and alert   Airway and Oxygen Therapy: Patient Spontanous Breathing  Post-op Pain: mild  Post-op Assessment: Post-op Vital signs reviewed, Patient's Cardiovascular Status Stable, Respiratory Function Stable, Patent Airway and No signs of Nausea or vomiting  Last Vitals:  Filed Vitals:   06/23/15 1502  BP: 132/64  Pulse: 80  Temp: 37.1 C  Resp: 15    Post-op Vital Signs: stable   Complications: No apparent anesthesia complications

## 2015-06-23 NOTE — Op Note (Signed)
NAME:  Madeline Chandler.: [id number]      MEDICAL RECORD NO.: [id number]      FACILITY:  [facility]      PHYSICIAN:  Paralee Cancel D  DATE OF BIRTH:  13-May-1961     DATE OF PROCEDURE:  06/23/2015                                 OPERATIVE REPORT         PREOPERATIVE DIAGNOSIS: Left  hip avascular necrosis.      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      PROCEDURE:  Left total hip replacement through an anterior approach   utilizing DePuy THR system, component size 58mm pinnacle cup, a size 32+4 neutral   Altrex liner, a size 4 hi Tri Lock stem with a 32+1 delta ceramic   ball.      SURGEON:  Pietro Cassis. Alvan Dame, M.D.      ASSISTANT:  Nehemiah Massed, PA-C      ANESTHESIA:  Spinal.      SPECIMENS:  None.      COMPLICATIONS:  None.      BLOOD LOSS:  400 cc     DRAINS:  None.      INDICATION OF THE PROCEDURE:  Madeline Chandler is a 54 y.o. female who had   presented to office for evaluation of left hip pain.  Radiographs revealed   progressive degenerative changes with bone-on-bone   articulation to the  hip joint.  The patient had painful limited range of   motion significantly affecting their overall quality of life.  The patient was failing to    respond to conservative measures, and at this point was ready   to proceed with more definitive measures.  The patient has noted progressive   degenerative changes in his hip, progressive problems and dysfunction   with regarding the hip prior to surgery.  Consent was obtained for   benefit of pain relief.  Specific risk of infection, DVT, component   failure, dislocation, need for revision surgery, as well discussion of   the anterior versus posterior approach were reviewed.  Consent was   obtained for benefit of anterior pain relief through an anterior   approach.      PROCEDURE IN DETAIL:  The patient was brought to operative  theater.   Once adequate anesthesia, preoperative antibiotics, 2gm of Ancef, 1 gm of Tranexamic, and 10 mg of Decadron administered.   The patient was positioned supine on the OSI [place] table.  Once adequate   padding of boney process was carried out, we had predraped out the hip, and  used fluoroscopy to confirm orientation of the pelvis and position.      The left hip was then prepped and draped from proximal iliac crest to   mid thigh with shower curtain technique.      Time-out was performed identifying the patient, planned procedure, and   extremity.     An incision was then made 2 cm distal and lateral to the   anterior superior iliac spine extending over the orientation of the   tensor fascia lata muscle and sharp dissection was carried down to the   fascia of the muscle and protractor placed in the soft  tissues.      The fascia was then incised.  The muscle belly was identified and swept   laterally and retractor placed along the superior neck.  Following   cauterization of the circumflex vessels and removing some pericapsular   fat, a second cobra retractor was placed on the inferior neck.  A third   retractor was placed on the anterior acetabulum after elevating the   anterior rectus.  A L-capsulotomy was along the line of the   superior neck to the trochanteric fossa, then extended proximally and   distally.  Tag sutures were placed and the retractors were then placed   intracapsular.  We then identified the trochanteric fossa and   orientation of my neck cut, confirmed this radiographically   and then made a neck osteotomy with the femur on traction.  The femoral   head was removed without difficulty or complication.  Traction was let   off and retractors were placed posterior and anterior around the   acetabulum.      The labrum and foveal tissue were debrided.  I began reaming with a 70mm   reamer and reamed up to 25mm reamer with good bony bed preparation and a 60mm    cup was chosen.  The final 6mm Pinnacle cup was then impacted under fluoroscopy  to confirm the depth of penetration and orientation with respect to   abduction.  A screw was placed followed by the hole eliminator.  The final   32+4 neutral Altrex liner was impacted with good visualized rim fit.  The cup was positioned anatomically within the acetabular portion of the pelvis.      At this point, the femur was rolled at 80 degrees.  Further capsule was   released off the inferior aspect of the femoral neck.  I then   released the superior capsule proximally.  The hook was placed laterally   along the femur and elevated manually and held in position with the bed   hook.  The leg was then extended and adducted with the leg rolled to 100   degrees of external rotation.  Once the proximal femur was fully   exposed, I used a box osteotome to set orientation.  I then began   broaching with the starting chili pepper broach and passed this by hand and then broached up to 4.  With the 4 broach in place I chose a high offset neck and did a trial reduction.  The offset was appropriate, leg lengths   appeared to be equal, confirmed radiographically.   Given these findings, I went ahead and dislocated the hip, repositioned all   retractors and positioned the right hip in the extended and abducted position.  The final 4 Hi Tri Lock stem was   chosen and it was impacted down to the level of neck cut.  Based on this   and the trial reduction, a 32+1 delta ceramic ball was chosen and   impacted onto a clean and dry trunnion, and the hip was reduced.  The   hip had been irrigated throughout the case again at this point.  I did   reapproximate the superior capsular leaflet to the anterior leaflet   using #1 Vicryl.  The fascia of the   tensor fascia lata muscle was then reapproximated using #1 Vicryl and #0 V-lock.  The   remaining wound was closed with 2-0 Vicryl and running 4-0 Monocryl.   The hip was  cleaned, dried,  and dressed sterilely using Dermabond and   Aquacel dressing.  She was then brought   to recovery room in stable condition tolerating the procedure well.    Danae Orleans, PA-C was present for the entirety of the case involved from   preoperative positioning, perioperative retractor management, general   facilitation of the case, as well as primary wound closure as assistant.            Pietro Cassis Alvan Dame, M.D.        06/23/2015 12:42 PM

## 2015-06-24 LAB — CBC
HEMATOCRIT: 28 % — AB (ref 36.0–46.0)
HEMOGLOBIN: 9 g/dL — AB (ref 12.0–15.0)
MCH: 28.2 pg (ref 26.0–34.0)
MCHC: 32.1 g/dL (ref 30.0–36.0)
MCV: 87.8 fL (ref 78.0–100.0)
Platelets: 177 10*3/uL (ref 150–400)
RBC: 3.19 MIL/uL — AB (ref 3.87–5.11)
RDW: 14.2 % (ref 11.5–15.5)
WBC: 10.6 10*3/uL — ABNORMAL HIGH (ref 4.0–10.5)

## 2015-06-24 LAB — BASIC METABOLIC PANEL
ANION GAP: 6 (ref 5–15)
BUN: 17 mg/dL (ref 6–20)
CHLORIDE: 101 mmol/L (ref 101–111)
CO2: 28 mmol/L (ref 22–32)
Calcium: 8.8 mg/dL — ABNORMAL LOW (ref 8.9–10.3)
Creatinine, Ser: 0.93 mg/dL (ref 0.44–1.00)
GFR calc Af Amer: >60 mL/min (ref >60)
GFR calc non Af Amer: >60 mL/min (ref >60)
Glucose, Bld: 158 mg/dL — ABNORMAL HIGH (ref 65–99)
POTASSIUM: 3.9 mmol/L (ref 3.5–5.1)
Sodium: 135 mmol/L (ref 135–145)

## 2015-06-24 NOTE — Progress Notes (Signed)
     Subjective: 1 Day Post-Op Procedure(s) (LRB): LEFT TOTAL HIP ARTHROPLASTY ANTERIOR APPROACH (Left)   Patient reports pain as mild, pain controlled. States that the pain is different than after the first hip replacement.  Still better than it was prior to surgery.  Objective:   VITALS:   Filed Vitals:   06/24/15 0456  BP: 121/58  Pulse: 83  Temp: 98.1 F (36.7 C)  Resp: 16    Dorsiflexion/Plantar flexion intact Incision: dressing C/D/I No cellulitis present Compartment soft  LABS  Recent Labs  06/24/15 0415  HGB 9.0*  HCT 28.0*  WBC 10.6*  PLT 177     Recent Labs  06/24/15 0415  NA 135  K 3.9  BUN 17  CREATININE 0.93  GLUCOSE 158*     Assessment/Plan: 1 Day Post-Op Procedure(s) (LRB): LEFT TOTAL HIP ARTHROPLASTY ANTERIOR APPROACH (Left) Foley cath d/c'ed Advance diet Up with therapy D/C IV fluids Discharge home with home health eventually, when ready  Obese (BMI 30-39.9) Estimated body mass index is 36.37 kg/(m^2) as calculated from the following:   Height as of this encounter: 5' 5.5" (1.664 m).   Weight as of this encounter: 100.699 kg (222 lb). Patient also counseled that weight may inhibit the healing process Patient counseled that losing weight will help with future health issues       [place] Pugh. Dewain Platz   PAC  06/24/2015, 9:32 AM

## 2015-06-24 NOTE — Evaluation (Signed)
Physical Therapy Evaluation Patient Details Name: Madeline Chandler MRN: [id number] DOB: 18-Jun-1961 Today's Date: 06/24/2015   History of Present Illness  This 54 y.o. female admitted for Lt THA direct anterior approach.  PMH includes:  HTN, h/o breast CA, GERD, anxiety, restless leg syndrome and R THR 10/16  Clinical Impression  Pt s/p L THR presents with decreased L LE strength/ROM and post op pain limiting functional mobility.  Pt should progress to dc home with family assist and HHPT follow up.   Follow Up Recommendations Home health PT    Equipment Recommendations  None recommended by PT    Recommendations for Other Services OT consult     Precautions / Restrictions Precautions Precautions: Fall Restrictions Weight Bearing Restrictions: No Other Position/Activity Restrictions: WBAT      Mobility  Bed Mobility Overal bed mobility: Needs Assistance Bed Mobility: Supine to Sit     Supine to sit: Min assist;Mod assist     General bed mobility comments: cues for sequence and use of R LE to self assist  Transfers Overall transfer level: Needs assistance Equipment used: Rolling walker (2 wheeled) Transfers: Sit to/from Stand Sit to Stand: Min assist;From elevated surface         General transfer comment: cues for LE management and use of UEs to self assist  Ambulation/Gait Ambulation/Gait assistance: Min assist;+2 safety/equipment Ambulation Distance (Feet): 12 Feet Assistive device: Rolling walker (2 wheeled) Gait Pattern/deviations: Step-to pattern;Decreased step length - right;Decreased step length - left;Shuffle;Trunk flexed Gait velocity: decr   General Gait Details: cues for sequence, posture and position from RW; distance ltd by onset dizziness - BP 96/47  Stairs            Wheelchair Mobility    Modified Rankin (Stroke Patients Only)       Balance                                             Pertinent Vitals/Pain Pain  Assessment: 0-10 Pain Score: 4  Pain Location: L hip Pain Descriptors / Indicators: Aching;Sore Pain Intervention(s): Limited activity within patient's tolerance;Monitored during session;Premedicated before session;Ice applied    Home Living Family/patient expects to be discharged to:: Private residence Living Arrangements: Other relatives Available Help at Discharge: Family Type of Home: House Home Access: Stairs to enter   Technical brewer of Steps: 1 Home Layout: One level Home Equipment: Environmental consultant - 2 wheels;Bedside commode      Prior Function Level of Independence: Independent         Comments: Pt was working full time PTA. Pt reports she required increased time for dressing and bathing, but was able to perform mod I      Hand Dominance   Dominant Hand: Right    Extremity/Trunk Assessment   Upper Extremity Assessment: Overall WFL for tasks assessed           Lower Extremity Assessment: LLE deficits/detail;RLE deficits/detail RLE Deficits / Details: hip flex ltd ~ 90 LLE Deficits / Details: Strength at hip 2+/5 with AAROM at hip to 80 flex and 15 abd  Cervical / Trunk Assessment: Normal  Communication   Communication: No difficulties  Cognition Arousal/Alertness: Awake/alert Behavior During Therapy: WFL for tasks assessed/performed Overall Cognitive Status: Within Functional Limits for tasks assessed  General Comments      Exercises Total Joint Exercises Ankle Circles/Pumps: AROM;Both;15 reps;Supine Quad Sets: AROM;Both;10 reps;Supine Heel Slides: AAROM;Left;15 reps;Supine Hip ABduction/ADduction: AAROM;Left;10 reps;Supine      Assessment/Plan    PT Assessment Patient needs continued PT services  PT Diagnosis Difficulty walking   PT Problem List Decreased strength;Decreased range of motion;Decreased activity tolerance;Decreased mobility;Decreased knowledge of use of DME;Pain  PT Treatment Interventions DME  instruction;Gait training;Stair training;Functional mobility training;Therapeutic activities;Therapeutic exercise;Patient/family education   PT Goals (Current goals can be found in the Care Plan section) Acute Rehab PT Goals Patient Stated Goal: walk with less pain PT Goal Formulation: With patient Time For Goal Achievement: 06/27/15 Potential to Achieve Goals: Good    Frequency 7X/week   Barriers to discharge        Co-evaluation               End of Session Equipment Utilized During Treatment: Gait belt Activity Tolerance: Patient limited by fatigue;Other (comment) (dizzy) Patient left: in chair;with call bell/phone within reach Nurse Communication: Mobility status         Time: [id number] PT Time Calculation (min) (ACUTE ONLY): 34 min   Charges:         PT G Codes:        Athaliah Baumbach 07-07-15, 12:10 PM

## 2015-06-24 NOTE — Care Management Note (Signed)
Case Management Note  Patient Details  Name: Madeline Chandler MRN: [id number] Date of Birth: 1960-12-27  Subjective/Objective:                  LEFT TOTAL HIP ARTHROPLASTY ANTERIOR APPROACH (Left) Action/Plan: Discharge planning Expected Discharge Date:  06/25/15               Expected Discharge Plan:  [place]  In-House Referral:     Discharge planning Services  CM Consult  Post Acute Care Choice:  Home Health Choice offered to:  Patient  DME Arranged:  N/A DME Agency:  NA  HH Arranged:  PT HH Agency:  [facility]  Status of Service:  Completed, signed off  Medicare Important Message Given:    Date Medicare IM Given:    Medicare IM give by:    Date Additional Medicare IM Given:    Additional Medicare Important Message give by:     If discussed at [place] of Stay Meetings, dates discussed:    Additional Comments: CM met with pt in room to offer choice of home health agency. Pt chooses [facility] to render HHPT.   Referral given to [facility] rep, Tim (on unit).  No other CM needs were communicated. Dellie Catholic, RN 06/24/2015, 2:10 PM

## 2015-06-24 NOTE — Evaluation (Signed)
Occupational Therapy Evaluation Patient Details Name: Madeline Chandler MRN: [id number] DOB: [birth year] Today's Date: 06/24/2015    History of Present Illness This 54 y.o. female admitted for Lt THA direct anterior approach.  PMH includes:  HTN, h/o breast CA, GERD, anxiety, restless leg syndrome and R THR 10/16   Clinical Impression   Pt was admitted for the above surgery. Reviewed all education and pt verbalizes understanding.  She does not need any further OT at this time.    Follow Up Recommendations  No OT follow up;Supervision/Assistance - 24 hour (initial)    Equipment Recommendations  None recommended by OT    Recommendations for Other Services       Precautions / Restrictions Precautions Precautions: Fall Restrictions Other Position/Activity Restrictions: WBAT      Mobility Bed Mobility               General bed mobility comments: oob  Transfers   Equipment used: Rolling walker (2 wheeled) Transfers: Sit to/from Stand Sit to Stand: Min guard         General transfer comment: for safety:  cues for UE/LE placement    Balance                                            ADL Overall ADL's : Needs assistance/impaired     Grooming: Wash/dry hands;Supervision/safety;Standing   Upper Body Bathing: Set up;Sitting   Lower Body Bathing: With adaptive equipment;Minimal assistance;Sit to/from stand   Upper Body Dressing : Set up;Sitting   Lower Body Dressing: Minimal assistance;Sit to/from stand;With adaptive equipment   Toilet Transfer: Min guard;Ambulation;BSC   Toileting- Water quality scientist and Hygiene: Min guard;Sit to/from stand         General ADL Comments: reviewed tub transfer and readiness.  Pt plans to have sister don socks prior to sister going to work.       Vision     Perception     Praxis      Pertinent Vitals/Pain Pain Score: 4  Pain Location: L hip Pain Descriptors / Indicators: Aching Pain  Intervention(s): Limited activity within patient's tolerance;Monitored during session;Premedicated before session;Repositioned;Ice applied     Hand Dominance Right   Extremity/Trunk Assessment Upper Extremity Assessment Upper Extremity Assessment: Overall WFL for tasks assessed           Communication Communication Communication: No difficulties   Cognition Arousal/Alertness: Awake/alert Behavior During Therapy: WFL for tasks assessed/performed Overall Cognitive Status: Within Functional Limits for tasks assessed                     General Comments       Exercises       Shoulder Instructions      Home Living Family/patient expects to be discharged to:: Private residence Living Arrangements: Other relatives Available Help at Discharge: Family                         Home Equipment: Gilford Rile - 2 wheels;Bedside commode   Additional Comments: last sx:  sponge bathed for a couple of weeks, then did a standing shower.  Has AE kit      Prior Functioning/Environment Level of Independence: Independent             OT Diagnosis: Acute pain   OT Problem List:     OT Treatment/Interventions:  OT Goals(Current goals can be found in the care plan section) Acute Rehab OT Goals Patient Stated Goal: walk with less pain  OT Frequency:     Barriers to D/C:            Co-evaluation              End of Session    Activity Tolerance: Patient tolerated treatment well Patient left: in chair;with call bell/phone within reach   Time: 4332-9518 OT Time Calculation (min): 20 min Charges:  OT General Charges $OT Visit: 1 Procedure OT Evaluation $Initial OT Evaluation Tier I: 1 Procedure G-Codes:    Matan Steen Jul 02, 2015, 3:54 PM  Lesle Chris, OTR/L [phone] 2015-07-02

## 2015-06-24 NOTE — Progress Notes (Signed)
Physical Therapy Treatment Patient Details Name: Madeline Chandler MRN: [id number] DOB: 16-Jul-1961 Today's Date: 06/24/2015    History of Present Illness This 54 y.o. female admitted for Lt THA direct anterior approach.  PMH includes:  HTN, h/o breast CA, GERD, anxiety, restless leg syndrome and R THR 10/16    PT Comments    Good progress with mobility.  Pt hopeful for dc home tomorrow.  Follow Up Recommendations  Home health PT     Equipment Recommendations  None recommended by PT    Recommendations for Other Services OT consult     Precautions / Restrictions Precautions Precautions: Fall Restrictions Weight Bearing Restrictions: No Other Position/Activity Restrictions: WBAT    Mobility  Bed Mobility               General bed mobility comments: Pt oob on arrival and declines back to bed  Transfers Overall transfer level: Needs assistance Equipment used: Rolling walker (2 wheeled) Transfers: Sit to/from Stand Sit to Stand: Min assist;Min guard         General transfer comment: Pt struggling with first attempt.  Reviewed LE management and use of UEs to self assist and pt performed at min guard  Ambulation/Gait Ambulation/Gait assistance: Min guard Ambulation Distance (Feet): 72 Feet Assistive device: Rolling walker (2 wheeled) Gait Pattern/deviations: Step-to pattern;Decreased step length - right;Decreased step length - left;Shuffle;Trunk flexed Gait velocity: decr   General Gait Details: cues for sequence, posture and position from RW;    Science writer    Modified Rankin (Stroke Patients Only)       Balance                                    Cognition Arousal/Alertness: Awake/alert Behavior During Therapy: WFL for tasks assessed/performed Overall Cognitive Status: Within Functional Limits for tasks assessed                      Exercises      General Comments        Pertinent  Vitals/Pain Pain Assessment: 0-10 Pain Score: 4  Pain Location: L hip Pain Descriptors / Indicators: Aching;Sore Pain Intervention(s): Limited activity within patient's tolerance;Monitored during session;Premedicated before session;Ice applied    Home Living Family/patient expects to be discharged to:: Private residence Living Arrangements: Other relatives Available Help at Discharge: Family         Home Equipment: Gilford Rile - 2 wheels;Bedside commode Additional Comments: last sx:  sponge bathed for a couple of weeks, then did a standing shower.  Has AE kit    Prior Function Level of Independence: Independent          PT Goals (current goals can now be found in the care plan section) Acute Rehab PT Goals Patient Stated Goal: walk with less pain PT Goal Formulation: With patient Time For Goal Achievement: 06/27/15 Potential to Achieve Goals: Good Progress towards PT goals: Progressing toward goals    Frequency  7X/week    PT Plan Current plan remains appropriate    Co-evaluation             End of Session Equipment Utilized During Treatment: Gait belt Activity Tolerance: Patient tolerated treatment well Patient left: in chair;with call bell/phone within reach     Time: 1345-1413 PT Time Calculation (min) (ACUTE ONLY): 28 min  Charges:  $Gait  Training: 23-37 mins                    G Codes:      Ladiamond Gallina 17-Jul-2015, 4:25 PM

## 2015-06-24 NOTE — Progress Notes (Signed)
Utilization review completed.  

## 2015-06-25 LAB — CBC
HCT: 26.7 % — ABNORMAL LOW (ref 36.0–46.0)
Hemoglobin: 8.8 g/dL — ABNORMAL LOW (ref 12.0–15.0)
MCH: 29.4 pg (ref 26.0–34.0)
MCHC: 33 g/dL (ref 30.0–36.0)
MCV: 89.3 fL (ref 78.0–100.0)
PLATELETS: 207 10*3/uL (ref 150–400)
RBC: 2.99 MIL/uL — AB (ref 3.87–5.11)
RDW: 14.7 % (ref 11.5–15.5)
WBC: 14 10*3/uL — ABNORMAL HIGH (ref 4.0–10.5)

## 2015-06-25 LAB — BASIC METABOLIC PANEL
ANION GAP: 4 — AB (ref 5–15)
BUN: 18 mg/dL (ref 6–20)
CHLORIDE: 102 mmol/L (ref 101–111)
CO2: 32 mmol/L (ref 22–32)
Calcium: 8.9 mg/dL (ref 8.9–10.3)
Creatinine, Ser: 0.87 mg/dL (ref 0.44–1.00)
GFR calc Af Amer: >60 mL/min (ref >60)
GLUCOSE: 145 mg/dL — AB (ref 65–99)
POTASSIUM: 3.9 mmol/L (ref 3.5–5.1)
Sodium: 138 mmol/L (ref 135–145)

## 2015-06-25 MED ORDER — ASPIRIN 325 MG PO TBEC: 325.0000 mg | DELAYED_RELEASE_TABLET | Freq: Two times a day (BID) | ORAL | Status: AC

## 2015-06-25 MED ORDER — FERROUS SULFATE 325 (65 FE) MG PO TABS: 325.0000 mg | ORAL_TABLET | Freq: Three times a day (TID) | ORAL | Status: DC

## 2015-06-25 MED ORDER — HYDROCODONE-ACETAMINOPHEN 7.5-325 MG PO TABS
1.0000 | ORAL_TABLET | ORAL | Status: DC | PRN
Start: 2015-06-25 — End: 2015-10-07

## 2015-06-25 MED ORDER — CYCLOBENZAPRINE HCL 10 MG PO TABS: 10.0000 mg | ORAL_TABLET | Freq: Three times a day (TID) | ORAL | Status: DC | PRN

## 2015-06-25 MED ORDER — POLYETHYLENE GLYCOL 3350 17 G PO PACK: 17.0000 g | PACK | Freq: Two times a day (BID) | ORAL | Status: DC

## 2015-06-25 MED ORDER — DOCUSATE SODIUM 100 MG PO CAPS: 100.0000 mg | ORAL_CAPSULE | Freq: Two times a day (BID) | ORAL | Status: DC

## 2015-06-25 NOTE — Progress Notes (Signed)
Physical Therapy Treatment Patient Details Name: Madeline Chandler MRN: [id number] DOB: [birth year] Today's Date: 06/25/2015    History of Present Illness This 54 y.o. female admitted for Lt THA direct anterior approach.  PMH includes:  HTN, h/o breast CA, GERD, anxiety, restless leg syndrome and R THR 10/16    PT Comments    Pt progressing well with mobility.  Reviewed stairs, car transfers, and therex with pt.  Follow Up Recommendations  Home health PT     Equipment Recommendations  None recommended by PT    Recommendations for Other Services OT consult     Precautions / Restrictions Precautions Precautions: Fall Restrictions Weight Bearing Restrictions: No Other Position/Activity Restrictions: WBAT    Mobility  Bed Mobility Overal bed mobility: Needs Assistance Bed Mobility: Supine to Sit     Supine to sit: Min guard     General bed mobility comments: cues for sequence and use of R LE to self assist  Transfers Overall transfer level: Needs assistance Equipment used: Rolling walker (2 wheeled) Transfers: Sit to/from Stand Sit to Stand: Supervision         General transfer comment: min cues for LE management and use of UEs to self assist  Ambulation/Gait Ambulation/Gait assistance: Min guard;Supervision Ambulation Distance (Feet): 123 Feet Assistive device: Rolling walker (2 wheeled) Gait Pattern/deviations: Step-to pattern;Step-through pattern;Decreased step length - right;Decreased step length - left;Shuffle;Trunk flexed Gait velocity: decr   General Gait Details: cues for sequence, posture and position from RW;    Stairs Stairs: Yes Stairs assistance: Min assist Stair Management: No rails;Step to pattern;Backwards;Forwards;With walker Number of Stairs: 1 General stair comments: single step fwd and bkwd with RW and cues for sequence and foot/RW placement  Wheelchair Mobility    Modified Rankin (Stroke Patients Only)       Balance                                     Cognition Arousal/Alertness: Awake/alert Behavior During Therapy: WFL for tasks assessed/performed Overall Cognitive Status: Within Functional Limits for tasks assessed                      Exercises Total Joint Exercises Ankle Circles/Pumps: AROM;Both;15 reps;Supine Quad Sets: AROM;Both;10 reps;Supine Gluteal Sets: AROM;Both;10 reps;Supine Heel Slides: AAROM;Left;Supine;20 reps Hip ABduction/ADduction: AAROM;Left;Supine;15 reps    General Comments        Pertinent Vitals/Pain Pain Assessment: 0-10 Pain Score: 4  Pain Location: L hip Pain Descriptors / Indicators: Aching;Sore Pain Intervention(s): Premedicated before session;Limited activity within patient's tolerance;Monitored during session;Ice applied    Home Living                      Prior Function            PT Goals (current goals can now be found in the care plan section) Acute Rehab PT Goals Patient Stated Goal: walk with less pain PT Goal Formulation: With patient Time For Goal Achievement: 06/27/15 Potential to Achieve Goals: Good Progress towards PT goals: Progressing toward goals    Frequency  7X/week    PT Plan Current plan remains appropriate    Co-evaluation             End of Session Equipment Utilized During Treatment: Gait belt Activity Tolerance: Patient tolerated treatment well Patient left: in chair;with call bell/phone within reach     Time: [id number] PT  Time Calculation (min) (ACUTE ONLY): 38 min  Charges:  $Gait Training: 8-22 mins $Therapeutic Exercise: 8-22 mins $Therapeutic Activity: 8-22 mins                    G Codes:      Charli Liberatore 07/04/15, 1:02 PM

## 2015-06-25 NOTE — Progress Notes (Signed)
     Subjective: 2 Days Post-Op Procedure(s) (LRB): LEFT TOTAL HIP ARTHROPLASTY ANTERIOR APPROACH (Left)   Patient reports pain as mild, pain controlled. No events throughout the night. Ready to be discharged home.  Objective:   VITALS:   Filed Vitals:   06/25/15 0432  BP: 118/54  Pulse: 89  Temp: 98.6 F (37 C)  Resp: 16    Dorsiflexion/Plantar flexion intact Incision: dressing C/D/I No cellulitis present Compartment soft  LABS  Recent Labs  06/24/15 0415 06/25/15 0420  HGB 9.0* 8.8*  HCT 28.0* 26.7*  WBC 10.6* 14.0*  PLT 177 207     Recent Labs  06/24/15 0415 06/25/15 0420  NA 135 138  K 3.9 3.9  BUN 17 18  CREATININE 0.93 0.87  GLUCOSE 158* 145*     Assessment/Plan: 2 Days Post-Op Procedure(s) (LRB): LEFT TOTAL HIP ARTHROPLASTY ANTERIOR APPROACH (Left) Up with therapy Discharge home with home health  Follow up in 2 weeks at [facility]. Follow up with OLIN,Dawid Dupriest D in 2 weeks.  Contact information:  [facility] [address], [address] [place]   PAC  06/25/2015, 8:49 AM

## 2015-06-25 NOTE — Progress Notes (Signed)
Discharged from floor via w/c, family & belongings with pt. No changes in assessment. Madeline Chandler  

## 2015-07-06 NOTE — Discharge Summary (Signed)
Physician Discharge Summary  Patient ID: Madeline Chandler MRN: [id number] DOB/AGE: [birth year] 55 y.o.  Admit date: 06/23/2015 Discharge date: 06/25/2015   Procedures:  Procedure(s) (LRB): LEFT TOTAL HIP ARTHROPLASTY ANTERIOR APPROACH (Left)  Attending Physician:  Dr. Paralee Cancel   Admission Diagnoses:   Left hip primary AVN / pain  Discharge Diagnoses:  Principal Problem:   S/P left THA, AA  Past Medical History  Diagnosis Date  . Hypertension   . History of breast cancer     right   . History of colonic polyps     Dr Collene Mares  . History of chemotherapy   . History of radiation therapy   . History of bronchitis   . Anxiety   . GERD (gastroesophageal reflux disease)   . Arthritis   . Cancer [facility])     breast cancer- right   . Bulging lumbar disc     2  . Restless leg syndrome     HPI:    Madeline Chandler, 54 y.o. female, has a history of pain and functional disability in the left hip(s) due to arthritis and AVN and patient has failed non-surgical conservative treatments for greater than 12 weeks to include NSAID's and/or analgesics, use of assistive devices and activity modification. Onset of symptoms was gradual starting 8 months ago with rapidlly worsening course since that time.The patient noted prior procedures of the hip to include arthroplasty on the right hip per Dr. Alvan Dame on 05/12/2015. Patient currently rates pain in the left hip at 10 out of 10 with activity. Patient has night pain, worsening of pain with activity and weight bearing, trendelenberg gait, pain that interfers with activities of daily living and pain with passive range of motion. Patient has evidence of periarticular osteophytes, joint space narrowing and AVN per MRI by imaging studies. This condition presents safety issues increasing the risk of falls. There is no current active infection. Risks, benefits and expectations were discussed with the patient. Risks including but not limited to the risk of  anesthesia, blood clots, nerve damage, blood vessel damage, failure of the prosthesis, infection and up to and including death. Patient understand the risks, benefits and expectations and wishes to proceed with surgery.   PCP: Leeanne Rio, PA-C   Discharged Condition: good  Hospital Course:  Patient underwent the above stated procedure on 06/23/2015. Patient tolerated the procedure well and brought to the recovery room in good condition and subsequently to the floor.  POD #1 BP: 121/58 ; Pulse: 83 ; Temp: 98.1 F (36.7 C) ; Resp: 16 Patient reports pain as mild, pain controlled. States that the pain is different than after the first hip replacement. Still better than it was prior to surgery. Dorsiflexion/plantar flexion intact, incision: dressing C/D/I, no cellulitis present and compartment soft.   LABS  Basename    HGB     9.0  HCT     28.0   POD #2  BP: 118/54 ; Pulse: 89 ; Temp: 98.6 F (37 C) ; Resp: 16 Patient reports pain as mild, pain controlled. No events throughout the night. Ready to be discharged home. Dorsiflexion/plantar flexion intact, incision: dressing C/D/I, no cellulitis present and compartment soft.   LABS  Basename    HGB     8.8  HCT     26.7    Discharge Exam: General appearance: alert, cooperative and no distress Extremities: Homans sign is negative, no sign of DVT, no edema, redness or tenderness in the calves or thighs  and no ulcers, gangrene or trophic changes  Disposition: Home with follow up in 2 weeks   Follow-up Information    Follow up with Mauri Pole, MD. Schedule an appointment as soon as possible for a visit in 2 weeks.   Specialty:  Orthopedic Surgery   Contact information:   [address] [place] [address] [phone]       Follow up with [facility].   Why:  home health physical therapy   Contact information:   [place] [address] [phone]        Discharge Instructions    Call MD / Call 911    Complete by:  As directed   If you experience chest pain or shortness of breath, CALL 911 and be transported to the hospital emergency room.  If you develope a fever above 101 F, pus (white drainage) or increased drainage or redness at the wound, or calf pain, call your surgeon's office.     Change dressing    Complete by:  As directed   Maintain surgical dressing until follow up in the clinic. If the edges start to pull up, may reinforce with tape. If the dressing is no longer working, may remove and cover with gauze and tape, but must keep the area dry and clean.  Call with any questions or concerns.     Constipation Prevention    Complete by:  As directed   Drink plenty of fluids.  Prune juice may be helpful.  You may use a stool softener, such as Colace (over the counter) 100 mg twice a day.  Use MiraLax (over the counter) for constipation as needed.     Diet - low sodium heart healthy    Complete by:  As directed      Discharge instructions    Complete by:  As directed   Maintain surgical dressing until follow up in the clinic. If the edges start to pull up, may reinforce with tape. If the dressing is no longer working, may remove and cover with gauze and tape, but must keep the area dry and clean.  Follow up in 2 weeks at [facility]. Call with any questions or concerns.     Increase activity slowly as tolerated    Complete by:  As directed   Weight bearing as tolerated with assist device (walker, cane, etc) as directed, use it as long as suggested by your surgeon or therapist, typically at least 4-6 weeks.     TED hose    Complete by:  As directed   Use stockings (TED hose) for 2 weeks on both leg(s).  You may remove them at night for sleeping.             Medication List    TAKE these medications        aspirin 325 MG EC tablet  Take 1 tablet (325 mg total) by mouth 2 (two) times daily.     carvedilol 25 MG  tablet  Commonly known as:  COREG  Take 1 tablet (25 mg total) by mouth 2 (two) times daily with a meal.     citalopram 40 MG tablet  Commonly known as:  CELEXA  Take 1 tablet (40 mg total) by mouth daily.     cyclobenzaprine 10 MG tablet  Commonly known as:  FLEXERIL  Take 1 tablet (10 mg total) by mouth 3 (three) times daily as needed for muscle spasms.  docusate sodium 100 MG capsule  Commonly known as:  COLACE  Take 1 capsule (100 mg total) by mouth 2 (two) times daily.     ferrous sulfate 325 (65 FE) MG tablet  Take 1 tablet (325 mg total) by mouth 3 (three) times daily after meals.     hydrochlorothiazide 25 MG tablet  Commonly known as:  HYDRODIURIL  TAKE ONE TABLET BY MOUTH ONCE DAILY     HYDROcodone-acetaminophen 7.5-325 MG tablet  Commonly known as:  NORCO  Take 1-2 tablets by mouth every 4 (four) hours as needed for moderate pain.     losartan 100 MG tablet  Commonly known as:  COZAAR  Take 1 tablet (100 mg total) by mouth daily.     pantoprazole 40 MG tablet  Commonly known as:  PROTONIX  Take 40 mg by mouth daily.     polyethylene glycol packet  Commonly known as:  MIRALAX / GLYCOLAX  Take 17 g by mouth 2 (two) times daily.         Signed: [place] Pugh. Saylah Ketner   PA-C  07/06/2015, 7:27 AM

## 2015-07-10 ENCOUNTER — Telehealth: Payer: Self-pay | Admitting: Physician Assistant

## 2015-07-10 NOTE — Telephone Encounter (Signed)
Patient tested at high risk for sleep apnea on her preop anethesia testing. Needs referral to pulmonology to assess for sleep apnea. Was giving her time out from surgeries to let her heal and have a break from medical procedures. Please let me know if patient is willing and I will set this up for her.

## 2015-07-13 NOTE — Telephone Encounter (Signed)
Called and [facility] @ 2:02pm @ [phone]) asking the pt to RTC regarding result and note below.//AB/CMA

## 2015-07-17 NOTE — Telephone Encounter (Signed)
lmovm RTC

## 2015-07-22 NOTE — Telephone Encounter (Signed)
Spoke with the pt and informed her of the preop anethesia testing results below.  Pt verbalized understanding.  Pt stated that she's to go back for a follow-up on the back surgery on (08/12/2015).   After her follow-up she will give [place] a call back regarding the referral to pulmonology.//AB/CMA

## 2015-08-05 ENCOUNTER — Telehealth: Payer: Self-pay | Admitting: *Deleted

## 2015-08-05 NOTE — Telephone Encounter (Signed)
Received FMLA Disability forms from [employer] for patient to be completed by provider; forwarded to PCP, awaiting return to office Tues, Aug 10, 2012/SLS 12/28

## 2015-08-11 NOTE — Telephone Encounter (Signed)
Have not seen patient through the disability period listed. Cannot fill out paperwork. It seems they are requesting operative reports so I am thinking she was supposed to have this paperwork sent to her orthopedist. Please call patient.

## 2015-08-11 NOTE — Telephone Encounter (Signed)
Provider reviewed forms upon returning to office and informed that patient's Orthopaedic Surgeon will have to handle her STD RE: her hip replacement and follow-up management from this procedure; pt states she has already had contacted her Insurance about this and will f/u with Ortho to verify that paperwork has been received; forms sent to shred/SLS

## 2015-09-18 ENCOUNTER — Other Ambulatory Visit: Payer: Self-pay | Admitting: Physician Assistant

## 2015-10-05 ENCOUNTER — Encounter: Payer: Self-pay | Admitting: Physician Assistant

## 2015-10-05 ENCOUNTER — Ambulatory Visit (INDEPENDENT_AMBULATORY_CARE_PROVIDER_SITE_OTHER): Payer: 59 | Admitting: Physician Assistant

## 2015-10-05 VITALS — BP 146/80 | HR 81 | Temp 98.6°F | Ht 66.0 in | Wt 234.2 lb

## 2015-10-05 DIAGNOSIS — M542 Cervicalgia: Secondary | ICD-10-CM | POA: Diagnosis not present

## 2015-10-05 DIAGNOSIS — R600 Localized edema: Secondary | ICD-10-CM | POA: Insufficient documentation

## 2015-10-05 DIAGNOSIS — R609 Edema, unspecified: Secondary | ICD-10-CM | POA: Diagnosis not present

## 2015-10-05 MED ORDER — MELOXICAM 7.5 MG PO TABS: 7.5000 mg | ORAL_TABLET | Freq: Two times a day (BID) | ORAL | Status: DC

## 2015-10-05 NOTE — Progress Notes (Signed)
Pre visit review using our clinic review tool, if applicable. No additional management support is needed unless otherwise documented below in the visit note. 

## 2015-10-05 NOTE — Assessment & Plan Note (Signed)
Rx Meloxicam 7.5 mg BID. Tylenol as directed if needed for breakthrough pain. Supportive measures reviewed.

## 2015-10-05 NOTE — Patient Instructions (Addendum)
For Swelling:  - Take your HCTZ daily as directed.  - Start a low-salt diet (see information below).  - Elevate legs while resting.  - Wear TED hose (compression stockings daily).  - Move around more at work.  - Follow-up if symptoms are not resolving.  For Neck Pain:  - Take Meloxicam as directed.  - Use Tylenol for breakthrough pain between doses of Meloxicam if needed (one dose per day)  - Apply Aspercreme to the neck and upper back.  - Avoid heavy lifting or overexertion.  DASH Eating Plan DASH stands for "Dietary Approaches to Stop Hypertension." The DASH eating plan is a healthy eating plan that has been shown to reduce high blood pressure (hypertension). Additional health benefits may include reducing the risk of type 2 diabetes mellitus, heart disease, and stroke. The DASH eating plan may also help with weight loss. WHAT DO I NEED TO KNOW ABOUT THE DASH EATING PLAN? For the DASH eating plan, you will follow these general guidelines:  Choose foods with a percent daily value for sodium of less than 5% (as listed on the food label).  Use salt-free seasonings or herbs instead of table salt or sea salt.  Check with your health care provider or pharmacist before using salt substitutes.  Eat lower-sodium products, often labeled as "lower sodium" or "no salt added."  Eat fresh foods.  Eat more vegetables, fruits, and low-fat dairy products.  Choose whole grains. Look for the word "whole" as the first word in the ingredient list.  Choose fish and skinless chicken or [place] more often than red meat. Limit fish, poultry, and meat to 6 oz (170 g) each day.  Limit sweets, desserts, sugars, and sugary drinks.  Choose heart-healthy fats.  Limit cheese to 1 oz (28 g) per day.  Eat more home-cooked food and less restaurant, buffet, and fast food.  Limit fried foods.  Cook foods using methods other than frying.  Limit canned vegetables. If you do use them, rinse them well to  decrease the sodium.  When eating at a restaurant, ask that your food be prepared with less salt, or no salt if possible. WHAT FOODS CAN I EAT? Seek help from a dietitian for individual calorie needs. Grains Whole grain or whole wheat bread. Brown rice. Whole grain or whole wheat pasta. Quinoa, bulgur, and whole grain cereals. Low-sodium cereals. Corn or whole wheat flour tortillas. Whole grain cornbread. Whole grain crackers. Low-sodium crackers. Vegetables Fresh or frozen vegetables (raw, steamed, roasted, or grilled). Low-sodium or reduced-sodium tomato and vegetable juices. Low-sodium or reduced-sodium tomato sauce and paste. Low-sodium or reduced-sodium canned vegetables.  Fruits All fresh, canned (in natural juice), or frozen fruits. Meat and Other Protein Products Ground beef (85% or leaner), grass-fed beef, or beef trimmed of fat. Skinless chicken or [place]. Ground chicken or [place]. Pork trimmed of fat. All fish and seafood. Eggs. Dried beans, peas, or lentils. Unsalted nuts and seeds. Unsalted canned beans. Dairy Low-fat dairy products, such as skim or 1% milk, 2% or reduced-fat cheeses, low-fat ricotta or cottage cheese, or plain low-fat yogurt. Low-sodium or reduced-sodium cheeses. Fats and Oils Tub margarines without trans fats. Light or reduced-fat mayonnaise and salad dressings (reduced sodium). Avocado. Safflower, olive, or canola oils. Natural peanut or almond butter. Other Unsalted popcorn and pretzels. The items listed above may not be a complete list of recommended foods or beverages. Contact your dietitian for more options. WHAT FOODS ARE NOT RECOMMENDED? Grains White bread. White pasta. White rice. Refined  cornbread. Bagels and croissants. Crackers that contain trans fat. Vegetables Creamed or fried vegetables. Vegetables in a cheese sauce. Regular canned vegetables. Regular canned tomato sauce and paste. Regular tomato and vegetable juices. Fruits Dried fruits. Canned  fruit in light or heavy syrup. Fruit juice. Meat and Other Protein Products Fatty cuts of meat. Ribs, chicken wings, bacon, sausage, bologna, salami, chitterlings, fatback, hot dogs, bratwurst, and packaged luncheon meats. Salted nuts and seeds. Canned beans with salt. Dairy Whole or 2% milk, cream, half-and-half, and cream cheese. Whole-fat or sweetened yogurt. Full-fat cheeses or blue cheese. Nondairy creamers and whipped toppings. Processed cheese, cheese spreads, or cheese curds. Condiments Onion and garlic salt, seasoned salt, table salt, and sea salt. Canned and packaged gravies. Worcestershire sauce. Tartar sauce. Barbecue sauce. Teriyaki sauce. Soy sauce, including reduced sodium. Steak sauce. Fish sauce. Oyster sauce. Cocktail sauce. Horseradish. Ketchup and mustard. Meat flavorings and tenderizers. Bouillon cubes. Hot sauce. Tabasco sauce. Marinades. Taco seasonings. Relishes. Fats and Oils Butter, stick margarine, lard, shortening, ghee, and bacon fat. Coconut, palm kernel, or palm oils. Regular salad dressings. Other Pickles and olives. Salted popcorn and pretzels. The items listed above may not be a complete list of foods and beverages to avoid. Contact your dietitian for more information. WHERE CAN I FIND MORE INFORMATION? National Heart, Lung, and Blood Institute: travelstabloid.com   This information is not intended to replace advice given to you by your health care provider. Make sure you discuss any questions you have with your health care provider.   Document Released: 07/14/2011 Document Revised: 08/15/2014 Document Reviewed: 05/29/2013 Elsevier Interactive Patient Education [employer].

## 2015-10-05 NOTE — Assessment & Plan Note (Signed)
Compression stockings, Diet and supportive measures reviewed. Follow-up if not improving.

## 2015-10-05 NOTE — Progress Notes (Signed)
Patient presents to clinic today c/o swelling of bilateral feet and ankles after restarting a job where she has to stay standing for long periods of time. Denies   Patient also endorses cervical neck pain with sharp pain radiating into LUE with tingling. Denies numbness or weakness. Patient with history of cervical radiculopathy, having prior injections.   Past Medical History  Diagnosis Date  . Hypertension   . History of breast cancer     right   . History of colonic polyps     Dr Collene Mares  . History of chemotherapy   . History of radiation therapy   . History of bronchitis   . Anxiety   . GERD (gastroesophageal reflux disease)   . Arthritis   . Cancer [facility])     breast cancer- right   . Bulging lumbar disc     2  . Restless leg syndrome     Current Outpatient Prescriptions on File Prior to Visit  Medication Sig Dispense Refill  . carvedilol (COREG) 25 MG tablet Take 1 tablet (25 mg total) by mouth 2 (two) times daily with a meal. 60 tablet 5  . citalopram (CELEXA) 40 MG tablet TAKE ONE TABLET BY MOUTH ONCE DAILY 30 tablet 0  . cyclobenzaprine (FLEXERIL) 10 MG tablet Take 1 tablet (10 mg total) by mouth 3 (three) times daily as needed for muscle spasms. 50 tablet 0  . docusate sodium (COLACE) 100 MG capsule Take 1 capsule (100 mg total) by mouth 2 (two) times daily. 10 capsule 0  . ferrous sulfate 325 (65 FE) MG tablet Take 1 tablet (325 mg total) by mouth 3 (three) times daily after meals.  3  . hydrochlorothiazide (HYDRODIURIL) 25 MG tablet TAKE ONE TABLET BY MOUTH ONCE DAILY 60 tablet 5  . HYDROcodone-acetaminophen (NORCO) 7.5-325 MG tablet Take 1-2 tablets by mouth every 4 (four) hours as needed for moderate pain. 100 tablet 0  . losartan (COZAAR) 100 MG tablet TAKE ONE TABLET BY MOUTH ONCE DAILY 30 tablet 0  . pantoprazole (PROTONIX) 40 MG tablet Take 40 mg by mouth daily.    . polyethylene glycol (MIRALAX / GLYCOLAX) packet Take 17 g by mouth 2 (two) times daily. 14 each 0     No current facility-administered medications on file prior to visit.    No Known Allergies  Family History  Problem Relation Age of Onset  . Hypertension Mother   . Diabetes Mother   . Hypertension Sister   . Hypertension Brother     X 3  . Stroke Neg Hx   . Heart disease Neg Hx   . Breast cancer Mother     Social History   Social History  . Marital Status: Single    Spouse Name: N/A  . Number of Children: N/A  . Years of Education: N/A   Social History Main Topics  . Smoking status: Never Smoker   . Smokeless tobacco: Never Used  . Alcohol Use: No  . Drug Use: No  . Sexual Activity: Not Asked   Other Topics Concern  . None   Social History Narrative   Review of Systems - See HPI.  All other ROS are negative.  BP 146/80 mmHg  Pulse 81  Temp(Src) 98.6 F (37 C) (Oral)  Ht 5\' 6"  (1.676 m)  Wt 234 lb 3.2 oz (106.232 kg)  BMI 37.82 kg/m2  SpO2 97%  Physical Exam  Constitutional: She is well-developed, well-nourished, and in no distress.  HENT:  Head:  Normocephalic and atraumatic.  Eyes: Conjunctivae are normal.  Neck: Neck supple.  Cardiovascular: Normal rate, regular rhythm, normal heart sounds and intact distal pulses.   Pulses:      Dorsalis pedis pulses are 2+ on the right side, and 2+ on the left side.       Posterior tibial pulses are 2+ on the right side, and 2+ on the left side.  Mild non-pitting edema noted of bilateral feet and shins.  Pulmonary/Chest: Effort normal and breath sounds normal. No respiratory distress. She has no wheezes. She has no rales. She exhibits no tenderness.  Musculoskeletal:       Cervical back: She exhibits pain. She exhibits normal range of motion, no tenderness and no bony tenderness.  Skin: Skin is warm and dry. No rash noted.  Vitals reviewed.   No results found for this or any previous visit (from the past 2160 hour(s)).  Assessment/Plan: Cervical pain (neck) Rx Meloxicam 7.5 mg BID. Tylenol as directed if  needed for breakthrough pain. Supportive measures reviewed.  Peripheral edema Compression stockings, Diet and supportive measures reviewed. Follow-up if not improving.

## 2015-10-05 NOTE — Progress Notes (Deleted)
before

## 2015-10-07 ENCOUNTER — Telehealth: Payer: Self-pay | Admitting: Physician Assistant

## 2015-10-07 MED ORDER — HYDROCODONE-ACETAMINOPHEN 7.5-325 MG PO TABS: 1.0000 | ORAL_TABLET | Freq: Four times a day (QID) | ORAL | Status: DC | PRN

## 2015-10-07 NOTE — Telephone Encounter (Signed)
Can Rx Norco to help with pain. Rx written. She will have to pick up as it cannot be faxed or phoned in.

## 2015-10-07 NOTE — Telephone Encounter (Signed)
Caller name: Florean   Relationship to patient: Self  Can be reached: [phone]  Pharmacy: [facility] [place], [place]  Reason for call: Pt is says that she is still currently having neck pain. She says that the pain medication that was prescribed isn't working . She would like to have something more if possible.    Please advise.

## 2015-10-07 NOTE — Telephone Encounter (Signed)
Called and spoke with the pt and informed her of the note below.  Pt verbalized understanding and agreed.//AB/CMA 

## 2015-10-21 ENCOUNTER — Other Ambulatory Visit: Payer: Self-pay | Admitting: Physician Assistant

## 2015-11-02 ENCOUNTER — Telehealth: Payer: Self-pay | Admitting: Physician Assistant

## 2015-11-02 NOTE — Telephone Encounter (Signed)
Pt says that she has to have a surgical clearance visit. Pt says that she need an appt as late as possible if possible. Please advise for scheduling.    CB: [id number]

## 2015-11-02 NOTE — Telephone Encounter (Signed)
4:30 is latest we have since labs will need to be drawn

## 2015-11-04 ENCOUNTER — Ambulatory Visit (INDEPENDENT_AMBULATORY_CARE_PROVIDER_SITE_OTHER): Payer: 59 | Admitting: Physician Assistant

## 2015-11-04 ENCOUNTER — Encounter: Payer: Self-pay | Admitting: Physician Assistant

## 2015-11-04 VITALS — BP 182/72 | HR 77 | Temp 98.1°F | Ht 66.0 in | Wt 231.4 lb

## 2015-11-04 DIAGNOSIS — Z01818 Encounter for other preprocedural examination: Secondary | ICD-10-CM | POA: Diagnosis not present

## 2015-11-04 NOTE — Progress Notes (Signed)
Pre visit review using our clinic review tool, if applicable. No additional management support is needed unless otherwise documented below in the visit note. 

## 2015-11-04 NOTE — Patient Instructions (Signed)
Please take your BP medication and pain medication when you get home.  Please take all of these medications as directed. Keeping pain under control will help keep BP at a better range. Please follow-up with Ortho regarding the Lyrica prescription.  Please stop by the lab for blood work. I will call you with your results.

## 2015-11-04 NOTE — Progress Notes (Signed)
Patient presents to clinic today for surgical clearance. Patient is scheduled in 1 month for fusion of cervical spine due to cervical disc disease and radiculopathy. Patient denies prior issue with general anesthesia. Has history of hypertension without noted history of CAD. Is currently on Carvedilol BID and HCTZ daily. Has not taken medications today. Patient denies chest pain, palpitations, lightheadedness, dizziness, vision changes or frequent headaches.  BP Readings from Last 3 Encounters:  11/04/15 182/72  10/05/15 146/80  06/25/15 118/54   Past Medical History  Diagnosis Date  . Hypertension   . History of breast cancer     right   . History of colonic polyps     Dr Collene Mares  . History of chemotherapy   . History of radiation therapy   . History of bronchitis   . Anxiety   . GERD (gastroesophageal reflux disease)   . Arthritis   . Cancer [facility])     breast cancer- right   . Bulging lumbar disc     2  . Restless leg syndrome     Current Outpatient Prescriptions on File Prior to Visit  Medication Sig Dispense Refill  . carvedilol (COREG) 25 MG tablet Take 1 tablet (25 mg total) by mouth 2 (two) times daily with a meal. 60 tablet 5  . citalopram (CELEXA) 40 MG tablet TAKE ONE TABLET BY MOUTH ONCE DAILY 30 tablet 0  . cyclobenzaprine (FLEXERIL) 10 MG tablet Take 1 tablet (10 mg total) by mouth 3 (three) times daily as needed for muscle spasms. 50 tablet 0  . hydrochlorothiazide (HYDRODIURIL) 25 MG tablet TAKE ONE TABLET BY MOUTH ONCE DAILY 60 tablet 5  . HYDROcodone-acetaminophen (NORCO) 7.5-325 MG tablet Take 1 tablet by mouth every 6 (six) hours as needed for moderate pain. 45 tablet 0  . losartan (COZAAR) 100 MG tablet TAKE ONE TABLET BY MOUTH ONCE DAILY *NEED  OFFICE  VISIT* 30 tablet 0  . pantoprazole (PROTONIX) 40 MG tablet Take 40 mg by mouth daily.     No current facility-administered medications on file prior to visit.    No Known Allergies  Family History    Problem Relation Age of Onset  . Hypertension Mother   . Diabetes Mother   . Hypertension Sister   . Hypertension Brother     X 3  . Stroke Neg Hx   . Heart disease Neg Hx   . Breast cancer Mother     Social History   Social History  . Marital Status: Single    Spouse Name: N/A  . Number of Children: N/A  . Years of Education: N/A   Social History Main Topics  . Smoking status: Never Smoker   . Smokeless tobacco: Never Used  . Alcohol Use: No  . Drug Use: No  . Sexual Activity: Not Asked   Other Topics Concern  . None   Social History Narrative   Review of Systems - See HPI.  All other ROS are negative.  BP 182/72 mmHg  Pulse 77  Temp(Src) 98.1 F (36.7 C) (Oral)  Ht 5\' 6"  (1.676 m)  Wt 231 lb 6.4 oz (104.962 kg)  BMI 37.37 kg/m2  SpO2 98%  Physical Exam  Constitutional: She is oriented to person, place, and time and well-developed, well-nourished, and in no distress.  HENT:  Head: Normocephalic and atraumatic.  Eyes: Conjunctivae are normal.  Neck: Neck supple.  Cardiovascular: Normal rate, regular rhythm, normal heart sounds and intact distal pulses.   Pulmonary/Chest: Effort normal  and breath sounds normal. No respiratory distress. She has no wheezes. She has no rales. She exhibits no tenderness.  Neurological: She is alert and oriented to person, place, and time.  Skin: Skin is warm and dry. No rash noted.  Psychiatric: Affect normal.  Vitals reviewed.   Recent Results (from the past 2160 hour(s))  Basic Metabolic Panel (BMET)     Status: Abnormal   Collection Time: 11/04/15  3:42 PM  Result Value Ref Range   Sodium 137 135 - 145 mEq/L   Potassium 3.9 3.5 - 5.1 mEq/L   Chloride 99 96 - 112 mEq/L   CO2 31 19 - 32 mEq/L   Glucose, Bld 100 (H) 70 - 99 mg/dL   BUN 15 6 - 23 mg/dL   Creatinine, Ser 0.93 0.40 - 1.20 mg/dL   Calcium 10.0 8.4 - 10.5 mg/dL   GFR 80.29 >60.00 mL/min  CULTURE, URINE COMPREHENSIVE     Status: None   Collection Time:  11/04/15  3:42 PM  Result Value Ref Range   Culture      ESCHERICHIA COLI ENTEROCOCCUS SPECIES STAPHYLOCOCCUS SPECIES (COAGULASE NEGATIVE)    Colony Count 60,000 COLONIES/ML    Organism ID, Bacteria ESCHERICHIA COLI    Organism ID, Bacteria ENTEROCOCCUS SPECIES    Organism ID, Bacteria STAPHYLOCOCCUS SPECIES (COAGULASE NEGATIVE)     Comment: Rifampin and Gentamicin should not be used as single drugs for treatment of Staph infections.       Susceptibility   Escherichia coli -  (no method available)    AMPICILLIN >=32 Resistant     AMOX/CLAVULANIC 16 Intermediate     AMPICILLIN/SULBACTAM >=32 Resistant     PIP/TAZO <=4 Sensitive     IMIPENEM <=0.25 Sensitive     CEFAZOLIN <=4 Not Reportable     CEFTRIAXONE <=1 Sensitive     CEFTAZIDIME <=1 Sensitive     CEFEPIME <=1 Sensitive     GENTAMICIN <=1 Sensitive     TOBRAMYCIN <=1 Sensitive     CIPROFLOXACIN >=4 Resistant     LEVOFLOXACIN >=8 Resistant     NITROFURANTOIN <=16 Sensitive     TRIMETH/SULFA* <=20 Sensitive      * NR=NOT REPORTABLE,SEE COMMENTORAL therapy:A cefazolin MIC of <32 predicts susceptibility to the oral agents cefaclor,cefdinir,cefpodoxime,cefprozil,cefuroxime,cephalexin,and loracarbef when used for therapy of uncomplicated UTIs due to E.coli,K.pneumomiae,and P.mirabilis. PARENTERAL therapy: A cefazolinMIC of >8 indicates resistance to parenteralcefazolin. An alternate test method must beperformed to confirm susceptibility to parenteralcefazolin.   Enterococcus species -  (no method available)    AMPICILLIN <=2 Sensitive     LEVOFLOXACIN 1 Sensitive     NITROFURANTOIN <=16 Sensitive     VANCOMYCIN 2 Sensitive     TETRACYCLINE >=16 Resistant    Staphylococcus species (coagulase negative) -  (no method available)    OXACILLIN >=4 Resistant     CEFAZOLIN  Resistant     GENTAMICIN <=0.5 Sensitive     CIPROFLOXACIN <=0.5 Sensitive     LEVOFLOXACIN <=0.12 Sensitive     NITROFURANTOIN <=16 Sensitive     TRIMETH/SULFA  <=10 Sensitive     VANCOMYCIN 1 Sensitive     RIFAMPIN <=0.5 Sensitive     TETRACYCLINE >=16 Resistant   INR/PT     Status: None   Collection Time: 11/04/15  3:42 PM  Result Value Ref Range   INR 1.0 0.8 - 1.0 ratio   Prothrombin Time 10.5 9.6 - 13.1 sec    Assessment/Plan: Preoperative clearance EKG obtained revealing NSR. Will check lab panel  today. Patient with history of hypertension, previously well controlled. Is elevated today but asymptomatic. Due to not taking BP medications as directed. She is to take medications once she is home and continue compliance with these medications. Will grant clearance based on lab results.

## 2015-11-05 ENCOUNTER — Ambulatory Visit: Payer: Self-pay | Admitting: Physician Assistant

## 2015-11-05 LAB — BASIC METABOLIC PANEL
BUN: 15 mg/dL (ref 6–23)
CALCIUM: 10 mg/dL (ref 8.4–10.5)
CO2: 31 mEq/L (ref 19–32)
Chloride: 99 mEq/L (ref 96–112)
Creatinine, Ser: 0.93 mg/dL (ref 0.40–1.20)
GFR: 80.29 mL/min (ref >60.00)
Glucose, Bld: 100 mg/dL — ABNORMAL HIGH (ref 70–99)
POTASSIUM: 3.9 meq/L (ref 3.5–5.1)
SODIUM: 137 meq/L (ref 135–145)

## 2015-11-05 LAB — PROTIME-INR
INR: 1 ratio (ref 0.8–1.0)
Prothrombin Time: 10.5 s (ref 9.6–13.1)

## 2015-11-07 LAB — CULTURE, URINE COMPREHENSIVE

## 2015-11-09 MED ORDER — NITROFURANTOIN MONOHYD MACRO 100 MG PO CAPS: 100.0000 mg | ORAL_CAPSULE | Freq: Two times a day (BID) | ORAL | Status: DC

## 2015-11-10 DIAGNOSIS — Z01818 Encounter for other preprocedural examination: Secondary | ICD-10-CM | POA: Insufficient documentation

## 2015-11-10 NOTE — Assessment & Plan Note (Signed)
EKG obtained revealing NSR. Will check lab panel today. Patient with history of hypertension, previously well controlled. Is elevated today but asymptomatic. Due to not taking BP medications as directed. She is to take medications once she is home and continue compliance with these medications. Will grant clearance based on lab results.

## 2015-11-14 IMAGING — CR DG HIP (WITH OR WITHOUT PELVIS) 1V PORT*R*
1 series · 2 of 2 positions shown · non-contrast
Comparison: MRI dated 04/18/2015

CLINICAL DATA: Avascular necrosis of the right hip.

EXAM:
DG C-ARM 1-60 MIN-NO REPORT; DG HIP (WITH OR WITHOUT PELVIS) 1V PORT
RIGHT

[Series 1: AP · U · 2 of 2 slices shown]
[im 1/2]
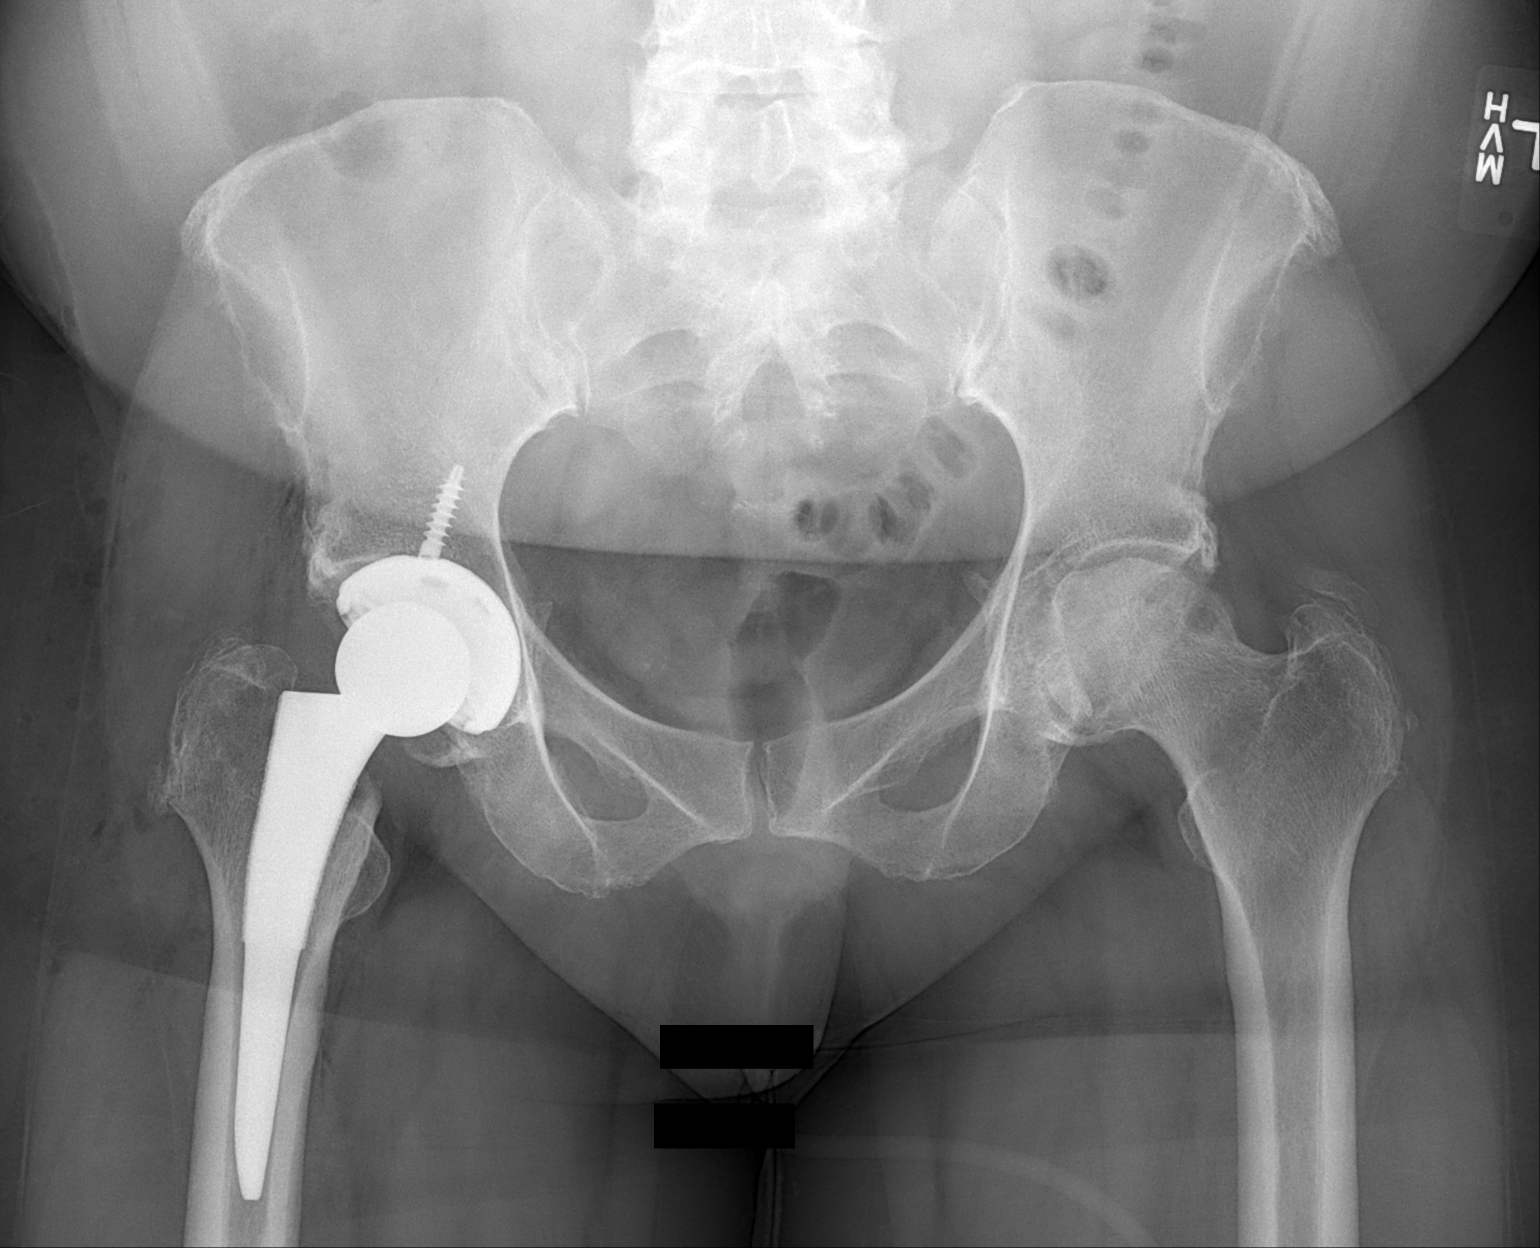
[im 2/2]
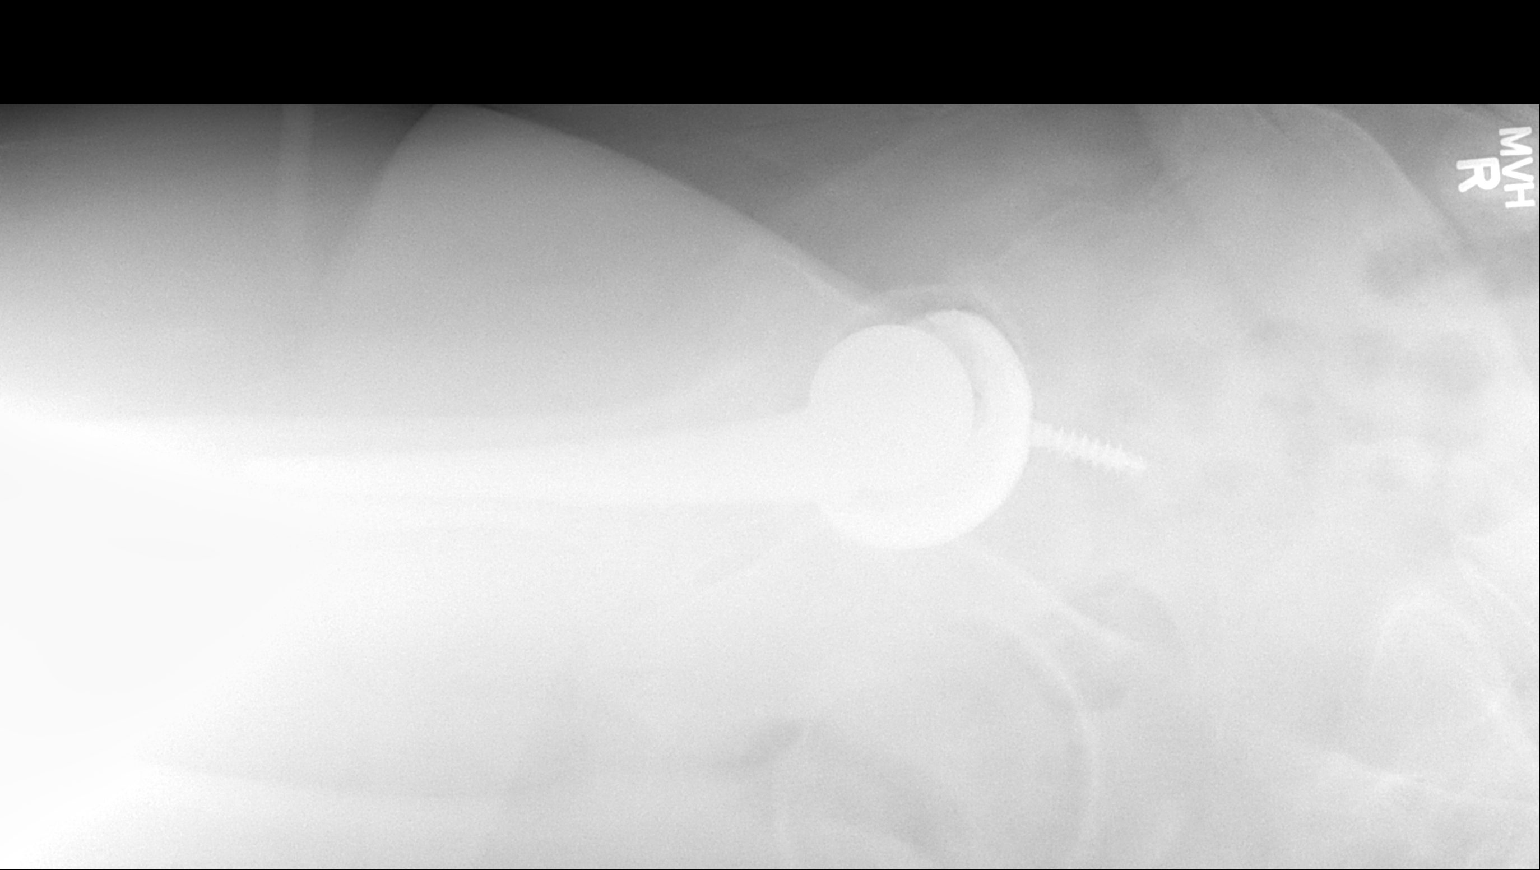

[2 of 2 positions shown; findings below may reference images not displayed]

FINDINGS: The patient has undergone right total hip prosthesis insertion. The
acetabular and femoral components appear in excellent position. No
fractures. Note is made of avascular necrosis of the left femoral
head with collapse of the superior aspect of the head.
IMPRESSION: Satisfactory appearance of the right hip after total hip prosthesis
insertion.

## 2015-11-20 ENCOUNTER — Other Ambulatory Visit: Payer: Self-pay | Admitting: Family Medicine

## 2015-11-22 NOTE — Telephone Encounter (Signed)
Your patient 

## 2015-11-25 ENCOUNTER — Encounter (HOSPITAL_COMMUNITY): Payer: Self-pay

## 2015-11-25 ENCOUNTER — Encounter (HOSPITAL_COMMUNITY)
Admission: RE | Admit: 2015-11-25 | Discharge: 2015-11-25 | Disposition: A | Discharge status: Home / Self Care | Payer: 59 | Source: Ambulatory Visit | Attending: Orthopedic Surgery | Admitting: Orthopedic Surgery

## 2015-11-25 DIAGNOSIS — K219 Gastro-esophageal reflux disease without esophagitis: Secondary | ICD-10-CM | POA: Insufficient documentation

## 2015-11-25 DIAGNOSIS — Z96643 Presence of artificial hip joint, bilateral: Secondary | ICD-10-CM | POA: Insufficient documentation

## 2015-11-25 DIAGNOSIS — Z79899 Other long term (current) drug therapy: Secondary | ICD-10-CM | POA: Insufficient documentation

## 2015-11-25 DIAGNOSIS — I10 Essential (primary) hypertension: Secondary | ICD-10-CM | POA: Insufficient documentation

## 2015-11-25 DIAGNOSIS — Z01812 Encounter for preprocedural laboratory examination: Secondary | ICD-10-CM | POA: Diagnosis not present

## 2015-11-25 DIAGNOSIS — M4802 Spinal stenosis, cervical region: Secondary | ICD-10-CM | POA: Insufficient documentation

## 2015-11-25 DIAGNOSIS — Z01818 Encounter for other preprocedural examination: Secondary | ICD-10-CM | POA: Diagnosis not present

## 2015-11-25 DIAGNOSIS — Z853 Personal history of malignant neoplasm of breast: Secondary | ICD-10-CM | POA: Diagnosis not present

## 2015-11-25 LAB — SURGICAL PCR SCREEN
MRSA, PCR: NEGATIVE
STAPHYLOCOCCUS AUREUS: POSITIVE — AB

## 2015-11-25 LAB — CBC
HCT: 37.3 % (ref 36.0–46.0)
HEMOGLOBIN: 12.1 g/dL (ref 12.0–15.0)
MCH: 27.7 pg (ref 26.0–34.0)
MCHC: 32.4 g/dL (ref 30.0–36.0)
MCV: 85.4 fL (ref 78.0–100.0)
Platelets: 224 10*3/uL (ref 150–400)
RBC: 4.37 MIL/uL (ref 3.87–5.11)
RDW: 15.6 % — ABNORMAL HIGH (ref 11.5–15.5)
WBC: 5.9 10*3/uL (ref 4.0–10.5)

## 2015-11-25 LAB — BASIC METABOLIC PANEL
Anion gap: 11 (ref 5–15)
BUN: 17 mg/dL (ref 6–20)
CHLORIDE: 102 mmol/L (ref 101–111)
CO2: 26 mmol/L (ref 22–32)
CREATININE: 0.85 mg/dL (ref 0.44–1.00)
Calcium: 9.9 mg/dL (ref 8.9–10.3)
GFR calc non Af Amer: >60 mL/min (ref >60)
Glucose, Bld: 109 mg/dL — ABNORMAL HIGH (ref 65–99)
Potassium: 4 mmol/L (ref 3.5–5.1)
SODIUM: 139 mmol/L (ref 135–145)

## 2015-11-25 NOTE — Progress Notes (Signed)
   11/25/15 0826  OBSTRUCTIVE SLEEP APNEA  Have you ever been diagnosed with sleep apnea through a sleep study? No  Do you snore loudly (loud enough to be heard through closed doors)?  1  Do you often feel tired, fatigued, or sleepy during the daytime (such as falling asleep during driving or talking to someone)? 0  Has anyone observed you stop breathing during your sleep? 0  Do you have, or are you being treated for high blood pressure? 1  BMI more than 35 kg/m2? 1  Age > 50 (1-yes) 1  Neck circumference greater than:Female 16 inches or larger, Female 17inches or larger? 1  Female Gender (Yes=1) 0  Obstructive Sleep Apnea Score 5  Score 5 or greater  Results sent to PCP

## 2015-11-26 NOTE — Progress Notes (Signed)
Anesthesia Chart Review:  Pt is 55 year old female scheduled for C4-7 ACDF on 12/02/2015 with Dr. Rolena Infante.   PCP is Raiford Noble, PA, who has cleared pt for surgery.   PMH includes:  HTN, breast cancer, GERD. Never smoker. BMI 37.5. S/p L THA 06/23/15. S/p R THA 05/12/15.   Medications include: carvedilol, hctz, losartan, protonix  Preoperative labs reviewed.   EKG 11/04/15: NSR.   If no changes, I anticipate pt can proceed with surgery as scheduled.   Willeen Cass, FNP-BC [facility]/Anesthesiology Phone: [phone] 11/26/2015 2:20 PM

## 2015-12-01 MED ORDER — CEFAZOLIN SODIUM-DEXTROSE 2-4 GM/100ML-% IV SOLN
2.0000 g | INTRAVENOUS | Status: AC
  Administered 2015-12-02 (×2): 2 g via INTRAVENOUS
  Filled 2015-12-01: qty 100

## 2015-12-02 ENCOUNTER — Telehealth: Payer: Self-pay | Admitting: *Deleted

## 2015-12-02 ENCOUNTER — Observation Stay (HOSPITAL_COMMUNITY)
Admission: RE | Admit: 2015-12-02 | Discharge: 2015-12-03 | Disposition: A | Discharge status: Home / Self Care | Payer: 59 | Source: Ambulatory Visit | Attending: Orthopedic Surgery | Admitting: Orthopedic Surgery

## 2015-12-02 ENCOUNTER — Observation Stay (HOSPITAL_COMMUNITY): Payer: 59

## 2015-12-02 ENCOUNTER — Encounter (HOSPITAL_COMMUNITY)
Admission: RE | Disposition: A | Discharge status: Home / Self Care | Payer: Self-pay | Source: Ambulatory Visit | Attending: Orthopedic Surgery

## 2015-12-02 ENCOUNTER — Ambulatory Visit (HOSPITAL_COMMUNITY): Payer: 59 | Admitting: Emergency Medicine

## 2015-12-02 ENCOUNTER — Ambulatory Visit (HOSPITAL_COMMUNITY): Payer: 59 | Admitting: Anesthesiology

## 2015-12-02 ENCOUNTER — Encounter (HOSPITAL_COMMUNITY): Payer: Self-pay | Admitting: Surgery

## 2015-12-02 DIAGNOSIS — I10 Essential (primary) hypertension: Secondary | ICD-10-CM | POA: Diagnosis not present

## 2015-12-02 DIAGNOSIS — Z0189 Encounter for other specified special examinations: Secondary | ICD-10-CM

## 2015-12-02 DIAGNOSIS — M4802 Spinal stenosis, cervical region: Secondary | ICD-10-CM | POA: Diagnosis present

## 2015-12-02 DIAGNOSIS — Z853 Personal history of malignant neoplasm of breast: Secondary | ICD-10-CM | POA: Insufficient documentation

## 2015-12-02 DIAGNOSIS — K219 Gastro-esophageal reflux disease without esophagitis: Secondary | ICD-10-CM | POA: Diagnosis not present

## 2015-12-02 DIAGNOSIS — Z96641 Presence of right artificial hip joint: Secondary | ICD-10-CM | POA: Diagnosis not present

## 2015-12-02 DIAGNOSIS — Z419 Encounter for procedure for purposes other than remedying health state, unspecified: Secondary | ICD-10-CM

## 2015-12-02 DIAGNOSIS — M542 Cervicalgia: Secondary | ICD-10-CM | POA: Diagnosis present

## 2015-12-02 DIAGNOSIS — M5412 Radiculopathy, cervical region: Secondary | ICD-10-CM | POA: Insufficient documentation

## 2015-12-02 DIAGNOSIS — Z6837 Body mass index (BMI) 37.0-37.9, adult: Secondary | ICD-10-CM | POA: Diagnosis not present

## 2015-12-02 DIAGNOSIS — Z981 Arthrodesis status: Secondary | ICD-10-CM

## 2015-12-02 DIAGNOSIS — E669 Obesity, unspecified: Secondary | ICD-10-CM | POA: Insufficient documentation

## 2015-12-02 DIAGNOSIS — M40292 Other kyphosis, cervical region: Secondary | ICD-10-CM | POA: Insufficient documentation

## 2015-12-02 HISTORY — PX: ANTERIOR CERVICAL DECOMP/DISCECTOMY FUSION: SHX1161

## 2015-12-02 SURGERY — ANTERIOR CERVICAL DECOMPRESSION/DISCECTOMY FUSION 3 LEVELS
Anesthesia: General | Site: Neck

## 2015-12-02 MED ORDER — LOSARTAN POTASSIUM 50 MG PO TABS
100.0000 mg | ORAL_TABLET | Freq: Every day | ORAL | Status: DC
  Administered 2015-12-03: 100 mg via ORAL
  Filled 2015-12-02: qty 2

## 2015-12-02 MED ORDER — METOCLOPRAMIDE HCL 5 MG/ML IJ SOLN
10.0000 mg | Freq: Once | INTRAMUSCULAR | Status: DC | PRN
Start: 2015-12-02 — End: 2015-12-02

## 2015-12-02 MED ORDER — HYDROMORPHONE HCL 1 MG/ML IJ SOLN
INTRAMUSCULAR | Status: AC
  Filled 2015-12-02: qty 1

## 2015-12-02 MED ORDER — CARVEDILOL 12.5 MG PO TABS
25.0000 mg | ORAL_TABLET | Freq: Two times a day (BID) | ORAL | Status: DC
Start: 2015-12-02 — End: 2015-12-03
  Administered 2015-12-02 – 2015-12-03 (×2): 25 mg via ORAL
  Filled 2015-12-02 (×2): qty 2

## 2015-12-02 MED ORDER — SODIUM CHLORIDE 0.9% FLUSH: 3.0000 mL | INTRAVENOUS | Status: DC | PRN

## 2015-12-02 MED ORDER — EPHEDRINE SULFATE 50 MG/ML IJ SOLN
INTRAMUSCULAR | Status: DC | PRN
  Administered 2015-12-02 (×2): 10 mg via INTRAVENOUS

## 2015-12-02 MED ORDER — DEXAMETHASONE SODIUM PHOSPHATE 4 MG/ML IJ SOLN
4.0000 mg | Freq: Four times a day (QID) | INTRAMUSCULAR | Status: AC
  Administered 2015-12-02 – 2015-12-03 (×2): 4 mg via INTRAVENOUS
  Filled 2015-12-02 (×2): qty 1

## 2015-12-02 MED ORDER — MENTHOL 3 MG MT LOZG: 1.0000 | LOZENGE | OROMUCOSAL | Status: DC | PRN

## 2015-12-02 MED ORDER — ROCURONIUM BROMIDE 50 MG/5ML IV SOLN
INTRAVENOUS | Status: AC
  Filled 2015-12-02: qty 1

## 2015-12-02 MED ORDER — CEFAZOLIN SODIUM 1 G IJ SOLR
INTRAMUSCULAR | Status: AC
  Filled 2015-12-02: qty 20

## 2015-12-02 MED ORDER — MORPHINE SULFATE (PF) 2 MG/ML IV SOLN
1.0000 mg | INTRAVENOUS | Status: DC | PRN
  Administered 2015-12-02 – 2015-12-03 (×2): 2 mg via INTRAVENOUS
  Filled 2015-12-02 (×2): qty 1

## 2015-12-02 MED ORDER — ONDANSETRON HCL 4 MG/2ML IJ SOLN
INTRAMUSCULAR | Status: AC
  Filled 2015-12-02: qty 2

## 2015-12-02 MED ORDER — MEPERIDINE HCL 25 MG/ML IJ SOLN: 6.2500 mg | INTRAMUSCULAR | Status: DC | PRN

## 2015-12-02 MED ORDER — BUPIVACAINE-EPINEPHRINE 0.25% -1:200000 IJ SOLN
INTRAMUSCULAR | Status: DC | PRN
  Administered 2015-12-02: 9 mL

## 2015-12-02 MED ORDER — CITALOPRAM HYDROBROMIDE 40 MG PO TABS
40.0000 mg | ORAL_TABLET | Freq: Every day | ORAL | Status: DC
  Administered 2015-12-03: 40 mg via ORAL
  Filled 2015-12-02: qty 1

## 2015-12-02 MED ORDER — MIDAZOLAM HCL 2 MG/2ML IJ SOLN
INTRAMUSCULAR | Status: AC
  Filled 2015-12-02: qty 2

## 2015-12-02 MED ORDER — ONDANSETRON HCL 4 MG/2ML IJ SOLN
INTRAMUSCULAR | Status: DC | PRN
  Administered 2015-12-02: 4 mg via INTRAVENOUS

## 2015-12-02 MED ORDER — HEMOSTATIC AGENTS (NO CHARGE) OPTIME
TOPICAL | Status: DC | PRN
  Administered 2015-12-02: 1 via TOPICAL

## 2015-12-02 MED ORDER — LACTATED RINGERS IV SOLN
INTRAVENOUS | Status: DC | PRN
  Administered 2015-12-02 (×3): via INTRAVENOUS

## 2015-12-02 MED ORDER — ROCURONIUM BROMIDE 100 MG/10ML IV SOLN
INTRAVENOUS | Status: DC | PRN
  Administered 2015-12-02: 50 mg via INTRAVENOUS

## 2015-12-02 MED ORDER — FENTANYL CITRATE (PF) 100 MCG/2ML IJ SOLN
INTRAMUSCULAR | Status: DC | PRN
  Administered 2015-12-02: 50 ug via INTRAVENOUS
  Administered 2015-12-02: 100 ug via INTRAVENOUS

## 2015-12-02 MED ORDER — METHOCARBAMOL 1000 MG/10ML IJ SOLN
500.0000 mg | Freq: Four times a day (QID) | INTRAVENOUS | Status: DC | PRN
  Filled 2015-12-02: qty 5

## 2015-12-02 MED ORDER — ONDANSETRON HCL 4 MG PO TABS: 4.0000 mg | ORAL_TABLET | Freq: Three times a day (TID) | ORAL | Status: DC | PRN

## 2015-12-02 MED ORDER — PHENYLEPHRINE HCL 10 MG/ML IJ SOLN
INTRAMUSCULAR | Status: DC | PRN
  Administered 2015-12-02: 80 ug via INTRAVENOUS

## 2015-12-02 MED ORDER — PROPOFOL 10 MG/ML IV BOLUS
INTRAVENOUS | Status: AC
  Filled 2015-12-02: qty 20

## 2015-12-02 MED ORDER — ACETAMINOPHEN 10 MG/ML IV SOLN
INTRAVENOUS | Status: DC | PRN
  Administered 2015-12-02: 1000 mg via INTRAVENOUS

## 2015-12-02 MED ORDER — PHENOL 1.4 % MT LIQD: 1.0000 | OROMUCOSAL | Status: DC | PRN

## 2015-12-02 MED ORDER — HYDROMORPHONE HCL 1 MG/ML IJ SOLN
0.2500 mg | INTRAMUSCULAR | Status: DC | PRN
  Administered 2015-12-02 (×4): 0.5 mg via INTRAVENOUS

## 2015-12-02 MED ORDER — DEXAMETHASONE SODIUM PHOSPHATE 4 MG/ML IJ SOLN
INTRAMUSCULAR | Status: DC | PRN
  Administered 2015-12-02: 4 mg via INTRAVENOUS

## 2015-12-02 MED ORDER — OXYCODONE-ACETAMINOPHEN 10-325 MG PO TABS: 1.0000 | ORAL_TABLET | ORAL | Status: DC | PRN

## 2015-12-02 MED ORDER — LIDOCAINE HCL (CARDIAC) 20 MG/ML IV SOLN
INTRAVENOUS | Status: AC
  Filled 2015-12-02: qty 5

## 2015-12-02 MED ORDER — PANTOPRAZOLE SODIUM 40 MG PO TBEC
40.0000 mg | DELAYED_RELEASE_TABLET | Freq: Every day | ORAL | Status: DC
  Administered 2015-12-03: 40 mg via ORAL
  Filled 2015-12-02: qty 1

## 2015-12-02 MED ORDER — LIDOCAINE HCL (CARDIAC) 20 MG/ML IV SOLN
INTRAVENOUS | Status: DC | PRN
  Administered 2015-12-02: 100 mg via INTRAVENOUS

## 2015-12-02 MED ORDER — PROPOFOL 10 MG/ML IV BOLUS
INTRAVENOUS | Status: DC | PRN
  Administered 2015-12-02: 180 mg via INTRAVENOUS

## 2015-12-02 MED ORDER — THROMBIN 20000 UNITS EX KIT
PACK | CUTANEOUS | Status: DC | PRN
  Administered 2015-12-02: 20000 [IU] via TOPICAL

## 2015-12-02 MED ORDER — ONDANSETRON HCL 4 MG/2ML IJ SOLN: 4.0000 mg | INTRAMUSCULAR | Status: DC | PRN

## 2015-12-02 MED ORDER — LACTATED RINGERS IV SOLN: INTRAVENOUS | Status: DC

## 2015-12-02 MED ORDER — DEXAMETHASONE 4 MG PO TABS: 4.0000 mg | ORAL_TABLET | Freq: Four times a day (QID) | ORAL | Status: AC

## 2015-12-02 MED ORDER — CEFAZOLIN SODIUM 1-5 GM-% IV SOLN
1.0000 g | Freq: Three times a day (TID) | INTRAVENOUS | Status: AC
Start: 2015-12-02 — End: 2015-12-03
  Administered 2015-12-02 – 2015-12-03 (×2): 1 g via INTRAVENOUS
  Filled 2015-12-02 (×3): qty 50

## 2015-12-02 MED ORDER — FENTANYL CITRATE (PF) 250 MCG/5ML IJ SOLN
INTRAMUSCULAR | Status: AC
  Filled 2015-12-02: qty 5

## 2015-12-02 MED ORDER — ACETAMINOPHEN 10 MG/ML IV SOLN
INTRAVENOUS | Status: AC
  Filled 2015-12-02: qty 100

## 2015-12-02 MED ORDER — OXYCODONE HCL 5 MG PO TABS
10.0000 mg | ORAL_TABLET | ORAL | Status: DC | PRN
  Administered 2015-12-02 – 2015-12-03 (×2): 10 mg via ORAL
  Filled 2015-12-02 (×2): qty 2

## 2015-12-02 MED ORDER — METHOCARBAMOL 500 MG PO TABS: 500.0000 mg | ORAL_TABLET | Freq: Four times a day (QID) | ORAL | Status: DC | PRN

## 2015-12-02 MED ORDER — DEXAMETHASONE SODIUM PHOSPHATE 4 MG/ML IJ SOLN
INTRAMUSCULAR | Status: AC
  Filled 2015-12-02: qty 1

## 2015-12-02 MED ORDER — BUPIVACAINE-EPINEPHRINE (PF) 0.25% -1:200000 IJ SOLN
INTRAMUSCULAR | Status: AC
  Filled 2015-12-02: qty 30

## 2015-12-02 MED ORDER — SODIUM CHLORIDE 0.9% FLUSH
3.0000 mL | Freq: Two times a day (BID) | INTRAVENOUS | Status: DC
  Administered 2015-12-03: 3 mL via INTRAVENOUS

## 2015-12-02 MED ORDER — HYDROCHLOROTHIAZIDE 25 MG PO TABS
25.0000 mg | ORAL_TABLET | Freq: Every day | ORAL | Status: DC
  Administered 2015-12-03: 25 mg via ORAL
  Filled 2015-12-02: qty 1

## 2015-12-02 MED ORDER — THROMBIN 20000 UNITS EX SOLR
CUTANEOUS | Status: AC
  Filled 2015-12-02: qty 20000

## 2015-12-02 MED ORDER — SODIUM CHLORIDE 0.9 % IV SOLN: 250.0000 mL | INTRAVENOUS | Status: DC

## 2015-12-02 MED ORDER — 0.9 % SODIUM CHLORIDE (POUR BTL) OPTIME
TOPICAL | Status: DC | PRN
  Administered 2015-12-02 (×2): 1000 mL

## 2015-12-02 MED ORDER — MIDAZOLAM HCL 5 MG/5ML IJ SOLN
INTRAMUSCULAR | Status: DC | PRN
  Administered 2015-12-02: 2 mg via INTRAVENOUS

## 2015-12-02 MED ORDER — METHOCARBAMOL 500 MG PO TABS: 500.0000 mg | ORAL_TABLET | Freq: Three times a day (TID) | ORAL | Status: DC | PRN

## 2015-12-02 SURGICAL SUPPLY — 75 items
BLADE SURG ROTATE 9660 (MISCELLANEOUS) IMPLANT
BUR EGG ELITE 4.0 (BURR) IMPLANT
BUR MATCHSTICK NEURO 3.0 LAGG (BURR) IMPLANT
CANISTER SUCTION 2500CC (MISCELLANEOUS) ×2 IMPLANT
CLIP SWIFT PLUS FIXATION 1MM (Clip) ×3 IMPLANT
CLSR STERI-STRIP ANTIMIC 1/2X4 (GAUZE/BANDAGES/DRESSINGS) ×2 IMPLANT
CORDS BIPOLAR (ELECTRODE) ×2 IMPLANT
COVER SURGICAL LIGHT HANDLE (MISCELLANEOUS) ×4 IMPLANT
CRADLE DONUT ADULT HEAD (MISCELLANEOUS) ×2 IMPLANT
DEVICE ENDSKLTN IMPL 16X14X7X6 (Neuro Prosthesis/Implant) ×2 IMPLANT
DEVICE ENDSKLTN TC NANOLCK 6MM (Neuro Prosthesis/Implant) ×1 IMPLANT
DRAPE C-ARM 42X72 X-RAY (DRAPES) ×2 IMPLANT
DRAPE POUCH INSTRU U-SHP 10X18 (DRAPES) ×2 IMPLANT
DRAPE SURG 17X23 STRL (DRAPES) ×2 IMPLANT
DRAPE U-SHAPE 47X51 STRL (DRAPES) ×2 IMPLANT
DRESSING AQUACEL AG SP 3.5X4 (GAUZE/BANDAGES/DRESSINGS) ×1 IMPLANT
DRSG AQUACEL AG SP 3.5X4 (GAUZE/BANDAGES/DRESSINGS) ×2
DRSG MEPILEX BORDER 4X8 (GAUZE/BANDAGES/DRESSINGS) IMPLANT
DURAPREP 6ML APPLICATOR 50/CS (WOUND CARE) ×2 IMPLANT
ELECT COATED BLADE 2.86 ST (ELECTRODE) ×2 IMPLANT
ELECT PENCIL ROCKER SW 15FT (MISCELLANEOUS) ×2 IMPLANT
ELECT REM PT RETURN 9FT ADLT (ELECTROSURGICAL) ×2
ELECTRODE REM PT RTRN 9FT ADLT (ELECTROSURGICAL) ×1 IMPLANT
ENDOSKELETON IMPLANT 16X14X7X6 (Neuro Prosthesis/Implant) ×4 IMPLANT
ENDOSKELETON TC NANOLOCK 6MM (Neuro Prosthesis/Implant) ×2 IMPLANT
GLOVE BIO SURGEON STRL SZ 6.5 (GLOVE) ×2 IMPLANT
GLOVE BIOGEL PI IND STRL 6.5 (GLOVE) ×1 IMPLANT
GLOVE BIOGEL PI IND STRL 8.5 (GLOVE) ×1 IMPLANT
GLOVE BIOGEL PI INDICATOR 6.5 (GLOVE) ×1
GLOVE BIOGEL PI INDICATOR 8.5 (GLOVE) ×1
GLOVE SS BIOGEL STRL SZ 8.5 (GLOVE) ×1 IMPLANT
GLOVE SUPERSENSE BIOGEL SZ 8.5 (GLOVE) ×1
GOWN STRL REUS W/ TWL XL LVL3 (GOWN DISPOSABLE) ×2 IMPLANT
GOWN STRL REUS W/TWL 2XL LVL3 (GOWN DISPOSABLE) ×2 IMPLANT
GOWN STRL REUS W/TWL XL LVL3 (GOWN DISPOSABLE) ×2
KIT BASIN OR (CUSTOM PROCEDURE TRAY) ×2 IMPLANT
KIT ROOM TURNOVER OR (KITS) ×2 IMPLANT
NEEDLE SPNL 18GX3.5 QUINCKE PK (NEEDLE) ×2 IMPLANT
NS IRRIG 1000ML POUR BTL (IV SOLUTION) ×4 IMPLANT
PACK ORTHO CERVICAL (CUSTOM PROCEDURE TRAY) ×2 IMPLANT
PACK UNIVERSAL I (CUSTOM PROCEDURE TRAY) ×2 IMPLANT
PAD ARMBOARD 7.5X6 YLW CONV (MISCELLANEOUS) ×6 IMPLANT
PATTIES SURGICAL .25X.25 (GAUZE/BANDAGES/DRESSINGS) IMPLANT
PATTIES SURGICAL .5 X.5 (GAUZE/BANDAGES/DRESSINGS) IMPLANT
PIN DISTRACTION 14 (PIN) ×4 IMPLANT
PLATE SWIFT 3LVL 48MM (Plate) ×2 IMPLANT
PUTTY BONE DBX 5CC MIX (Putty) ×2 IMPLANT
RESTRAINT LIMB HOLDER UNIV (RESTRAINTS) ×2 IMPLANT
SCREW SD-VA 14M SWIFT PLUS (Screw) ×12 IMPLANT
SCREW SELF DRILL SWIFT 16MM (Screw) ×4 IMPLANT
SPONGE INTESTINAL PEANUT (DISPOSABLE) ×4 IMPLANT
SPONGE LAP 4X18 X RAY DECT (DISPOSABLE) ×4 IMPLANT
SPONGE SURGIFOAM ABS GEL 100 (HEMOSTASIS) ×2 IMPLANT
STRIP CLOSURE SKIN 1/2X4 (GAUZE/BANDAGES/DRESSINGS) ×2 IMPLANT
SURGIFLO W/THROMBIN 8M KIT (HEMOSTASIS) ×2 IMPLANT
SUT BONE WAX W31G (SUTURE) ×2 IMPLANT
SUT MON AB 3-0 SH 27 (SUTURE)
SUT MON AB 3-0 SH27 (SUTURE) IMPLANT
SUT SILK 2 0 (SUTURE)
SUT SILK 2-0 18XBRD TIE 12 (SUTURE) IMPLANT
SUT STRATAFIX MNCRL+ 3-0 PS-2 (SUTURE) ×1
SUT STRATAFIX MONOCRYL 3-0 (SUTURE) ×1
SUT STRATAFIX SPIRAL + 2-0 (SUTURE) ×2 IMPLANT
SUT VIC AB 2-0 CT1 18 (SUTURE) IMPLANT
SUTURE STRATFX MNCRL+ 3-0 PS-2 (SUTURE) ×1 IMPLANT
SWIFT CLIP ×6 IMPLANT
SWIFT CLIP PLUS FIXATION 1MM (Clip) ×6 IMPLANT
SYR BULB IRRIGATION 50ML (SYRINGE) ×2 IMPLANT
SYR CONTROL 10ML LL (SYRINGE) ×2 IMPLANT
TAPE CLOTH 4X10 WHT NS (GAUZE/BANDAGES/DRESSINGS) ×2 IMPLANT
TAPE UMBILICAL COTTON 1/8X30 (MISCELLANEOUS) ×2 IMPLANT
TOWEL OR 17X24 6PK STRL BLUE (TOWEL DISPOSABLE) ×2 IMPLANT
TOWEL OR 17X26 10 PK STRL BLUE (TOWEL DISPOSABLE) ×2 IMPLANT
TRAY FOLEY CATH 16FRSI W/METER (SET/KITS/TRAYS/PACK) ×2 IMPLANT
WATER STERILE IRR 1000ML POUR (IV SOLUTION) IMPLANT

## 2015-12-02 NOTE — Brief Op Note (Signed)
12/02/2015  1:25 PM  PATIENT:  Madeline Chandler  55 y.o. female  PRE-OPERATIVE DIAGNOSIS:  cervical spinal stenosis with kyphosis  POST-OPERATIVE DIAGNOSIS:  cervical spinal stenosis with kyphosis  PROCEDURE:  Procedure(s): ANTERIOR CERVICAL DECOMPRESSION/DISCECTOMY FUSION C4 - C7  3 LEVELS (N/A)  SURGEON:  Surgeon(s) and Role:    * Melina Schools, MD - Primary  PHYSICIAN ASSISTANT:   ASSISTANTS: Carmen Mayo   ANESTHESIA:   general  EBL:  Total I/O In: 1000 [I.V.:1000] Out: 300 [Urine:300]  BLOOD ADMINISTERED:none  DRAINS: none   LOCAL MEDICATIONS USED:  MARCAINE     SPECIMEN:  No Specimen  DISPOSITION OF SPECIMEN:  N/A  COUNTS:  YES  TOURNIQUET:  * No tourniquets in log *  DICTATION: .Other Dictation: Dictation Number [id number]  PLAN OF CARE: Admit for overnight observation  PATIENT DISPOSITION:  PACU - hemodynamically stable.

## 2015-12-02 NOTE — Discharge Instructions (Signed)

## 2015-12-02 NOTE — Telephone Encounter (Signed)
Received a note from anesthesia regarding Madeline Chandler. She scored at increased risk for sleep apnea on pre-anesthesia assessment. I want to set her up with Pulmonology for assessment for sleep apnea. Please set up referral if patient agrees to it.

## 2015-12-02 NOTE — Anesthesia Postprocedure Evaluation (Signed)
Anesthesia Post Note  Patient: ARIANIS DUBINSKI  Procedure(s) Performed: Procedure(s) (LRB): ANTERIOR CERVICAL DECOMPRESSION/DISCECTOMY FUSION C4 - C7  3 LEVELS (N/A)  Patient location during evaluation: PACU Anesthesia Type: General Level of consciousness: awake and alert Pain management: pain level controlled Vital Signs Assessment: post-procedure vital signs reviewed and stable Respiratory status: spontaneous breathing, nonlabored ventilation, respiratory function stable and patient connected to nasal cannula oxygen Cardiovascular status: blood pressure returned to baseline and stable Postop Assessment: no signs of nausea or vomiting Anesthetic complications: no    Last Vitals:  Filed Vitals:   12/02/15 1445 12/02/15 1450  BP:  112/75  Pulse: 91 91  Temp:    Resp: 13 13    Last Pain:  Filed Vitals:   12/02/15 1451  PainSc: Asleep                 Montez Hageman

## 2015-12-02 NOTE — Progress Notes (Signed)
Patient arrived to [facility]. Patient alert and orient X4 with no c/o pain. Vital signs taken and charted. Oriented to room with bed alarm on . Family at bedside.

## 2015-12-02 NOTE — H&P (Signed)
History of Present Illness  The patient is a 55 year old female who presents for a Follow-up for Follow-up Neck. The patient is being followed for their neck pain. Symptoms reported today include: pain (into left scapula, arm and into the fingers), aching and numbness (left hand). The patient feels that they are doing poorly and report their pain level to be 8 / 10. Current treatment includes: NSAIDs (Meloxicam) and pain medications (Hydrocodone, Robaxin). The following medication has been used for pain control: antiinflammatory medication (Meloxicam) and Hydrocodone.   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 (ddb/rcy) Robaxin (500MG  Tablet, 1 (one) Tablet Oral at bedtime prn, Taken starting 10/12/2015) Active. (DDB/RCY) HydroDiuril (25MG  Tablet, Oral) Active. Losartan Potassium (100MG  Tablet, Oral) Active. Hydrocodone-Acetaminophen (7.5-325MG  Tablet, Oral)  Active. Ferrous Sulfate (325 (65 Fe)MG Tablet, Oral) Active. HydroCHLOROthiazide (25MG  Tablet, Oral) Active. Polyethylene Glycol 3350 (Oral) Active. Medications Reconciled  Other Problems  High blood pressure  Anxiety Disorder  Heart murmur  Gastroesophageal Reflux Disease  Diverticulitis Of Colon  Breast Cancer  Migraine Headache  Cervicalgia (M54.2)    Physical Exam  General General Appearance-Not in acute distress. Orientation-Oriented X3. Build & Nutrition-Well nourished and Well developed.  Integumentary General Characteristics Surgical Scars - no surgical scar evidence of previous cervical surgery. Cervical Spine-Skin examination of the cervical spine is without deformity, skin lesions, lacerations or abrasions.  Peripheral Vascular Upper Extremity Palpation - Radial pulse - Bilateral - 2+.  Neurologic Sensation Upper Extremity - Bilateral - sensation is intact in the upper extremity. Reflexes Biceps Reflex - Bilateral - 2+. Brachioradialis Reflex - Bilateral - 2+. Triceps Reflex - Bilateral - 2+. Hoffman's Sign - Bilateral - Hoffman's sign not present.  Musculoskeletal Spine/Ribs/Pelvis  Cervical Spine : Inspection and Palpation - Tenderness - left cervical paraspinals tender to palpation and left trapezius tender to palpation. Strength and Tone: Strength: Strength: Strength - Deltoid - Bilateral - 5/5. Biceps - Bilateral - 5/5. Triceps - Bilateral - 5/5. Right - 5/5. Wrist Extension - Left - 4-/5. Hand Grip - Bilateral - 5/5. Heel walk - Bilateral - able to heel walk without difficulty. Toe Walk - Bilateral - able to walk on toes without difficulty. Heel-Toe Walk - Bilateral - able to heel-toe walk without difficulty. ROM - Flexion - Moderately Decreased and painful. Extension - Moderately Decreased and painful. Left Lateral Flexion - Moderately Decreased and painful. Right Lateral Flexion - Moderately Decreased. Left Rotation - Moderately  Decreased and painful. Right Rotation - Moderately Decreased. Pain - . Cervical Spine - Special Testing - axial compression test negative, cross chest impingement test  negative. Non-Anatomic Signs - No non-anatomic signs present.   Her MRI is significant for a small central protrusion at C2-C3 severe right neural foraminal stenosis. At C3-4, there is moderate central canal stenosis, mild mass effect on the cord, severe bilateral neural foraminal stenosis due to arthrosis. At C4-C5, there is bilateral arthrosis, severe bilateral neural foraminal stenosis and moderate central canal stenosis with mild mass effect on the cord. At C5-C6, small right paracentral disc protrusion, osteophyte complex, and bilateral arthrosis resulting in severe central canal stenosis, moderate-to-marked mass effect on the right, ventral cord and moderate mass effect on the left, ventral cord and severe bilateral neural foraminal stenosis. At C6-C7, mild disc bulge, mild disc protrusion osteophyte complex, severe left neural foraminal stenosis Assessment & Plan   Goal Of Surgery: Discussed that goal of surgery is to reduce pain and improve function and quality of life. Patient is aware that despite all appropriate treatment that there pain and function could be the same, worse, or different.  Anterior cervical fusion:Risks of surgery include, but are not limited to: Throat pain, swallowing difficulty, hoarseness or change in voice, death, stroke, paralysis, nerve root damage/injury, bleeding, blood clots, loss of bowel/bladder control, hardware failure, or mal-position, spinal fluid leak, adjacent segment disease, non-union, need for further surgery, ongoing or worse pain, infection. Post-operative bleeding or swelling that could require emergent surgery.  Assessments Transcription Very pleasant 55 year old female patient with chronic cervical pain and radicular pain into her left upper extremity. She is starting to notice some  weakness as well.  Failed conservative treatment including activity modification, injections, & pain medications.  Plan on 3 level ACDF to address pathology.  C4-7.  Marland Kitchen

## 2015-12-02 NOTE — Transfer of Care (Signed)
Immediate Anesthesia Transfer of Care Note  Patient: Madeline Chandler  Procedure(s) Performed: Procedure(s): ANTERIOR CERVICAL DECOMPRESSION/DISCECTOMY FUSION C4 - C7  3 LEVELS (N/A)  Patient Location: PACU  Anesthesia Type:General  Level of Consciousness: awake, alert  and oriented  Airway & Oxygen Therapy: Patient Spontanous Breathing and Patient connected to nasal cannula oxygen  Post-op Assessment: Report given to RN and Post -op Vital signs reviewed and stable  Post vital signs: Reviewed and stable  Last Vitals:  Filed Vitals:   12/02/15 0650 12/02/15 1349  BP: 163/74   Pulse: 84   Temp: 36.9 C 37.6 C  Resp: 18     Last Pain: There were no vitals filed for this visit.    Patients Stated Pain Goal: 3 ([id number])  Complications: No apparent anesthesia complications

## 2015-12-02 NOTE — Anesthesia Preprocedure Evaluation (Signed)
Anesthesia Evaluation  Patient identified by MRN, date of birth, ID band Patient awake    Reviewed: Allergy & Precautions, NPO status , Patient's Chart, lab work & pertinent test results, reviewed documented beta blocker date and time   History of Anesthesia Complications Negative for: history of anesthetic complications  Airway Mallampati: II  TM Distance: >3 FB Neck ROM: Full    Dental  (+) Teeth Intact   Pulmonary neg pulmonary ROS,    Pulmonary exam normal breath sounds clear to auscultation       Cardiovascular hypertension, Pt. on medications and Pt. on home beta blockers (-) angina(-) Past MI Normal cardiovascular exam Rhythm:Regular Rate:Normal     Neuro/Psych  Headaches, PSYCHIATRIC DISORDERS Anxiety Depression    GI/Hepatic Neg liver ROS, GERD  Medicated,  Endo/Other  Obesity   Renal/GU negative Renal ROS  negative genitourinary   Musculoskeletal  (+) Arthritis , Osteoarthritis,    Abdominal   Peds negative pediatric ROS (+)  Hematology negative hematology ROS (+)   Anesthesia Other Findings   Reproductive/Obstetrics                             Anesthesia Physical  Anesthesia Plan  ASA: III  Anesthesia Plan: General   Post-op Pain Management:    Induction: Intravenous  Airway Management Planned: Oral ETT  Additional Equipment:   Intra-op Plan:   Post-operative Plan: Extubation in OR  Informed Consent: I have reviewed the patients History and Physical, chart, labs and discussed the procedure including the risks, benefits and alternatives for the proposed anesthesia with the patient or authorized representative who has indicated his/her understanding and acceptance.   Dental advisory given  Plan Discussed with: CRNA, Anesthesiologist and Surgeon  Anesthesia Plan Comments:         Anesthesia Quick Evaluation

## 2015-12-02 NOTE — Anesthesia Procedure Notes (Signed)
Procedure Name: Intubation Date/Time: 12/02/2015 8:50 AM Performed by: Manus Gunning, Liberti Appleton J Pre-anesthesia Checklist: Patient identified, Emergency Drugs available, Timeout performed, Suction available and Patient being monitored Patient Re-evaluated:Patient Re-evaluated prior to inductionOxygen Delivery Method: Circle system utilized Preoxygenation: Pre-oxygenation with 100% oxygen Intubation Type: IV induction Ventilation: Mask ventilation without difficulty Laryngoscope Size: Mac and 3 Grade View: Grade II Tube type: Oral Tube size: 7.0 mm Number of attempts: 1 Placement Confirmation: ETT inserted through vocal cords under direct vision,  positive ETCO2 and breath sounds checked- equal and bilateral Secured at: 21 cm Tube secured with: Tape Dental Injury: Teeth and Oropharynx as per pre-operative assessment

## 2015-12-02 NOTE — Telephone Encounter (Signed)
Called and [facility] @ 5:28pm @ [phone]) asking the pt to RTC regarding the note below.//AB/CMA

## 2015-12-03 DIAGNOSIS — M4802 Spinal stenosis, cervical region: Secondary | ICD-10-CM | POA: Diagnosis not present

## 2015-12-03 MED FILL — Thrombin For Soln 20000 Unit: CUTANEOUS | Qty: 1 | Status: AC

## 2015-12-03 NOTE — Progress Notes (Signed)
Patient is being d/c home. Dc instructions given and patient verbalized understanding. IV d/c

## 2015-12-03 NOTE — Progress Notes (Signed)
    Subjective: Procedure(s) (LRB): ANTERIOR CERVICAL DECOMPRESSION/DISCECTOMY FUSION C4 - C7  3 LEVELS (N/A) 1 Day Post-Op  Patient reports pain as 1 on 0-10 scale.  Reports decreased arm pain reports incisional neck pain   Positive void Negative bowel movement Positive flatus Negative chest pain or shortness of breath  Objective: Vital signs in last 24 hours: Temp:  [97.8 F (36.6 C)-99.7 F (37.6 C)] 98 F (36.7 C) (04/27 0548) Pulse Rate:  [75-101] 75 (04/27 0548) Resp:  [11-18] 16 (04/27 0548) BP: (112-132)/(64-81) 123/69 mmHg (04/27 0548) SpO2:  [94 %-100 %] 99 % (04/27 0548) Weight:  [105 kg (231 lb 7.7 oz)] 105 kg (231 lb 7.7 oz) (04/27 0445)  Intake/Output from previous day: 04/26 0701 - 04/27 0700 In: 2200 [I.V.:2200] Out: 550 [Urine:450; Blood:100]  Labs: No results for input(s): WBC, RBC, HCT, PLT in the last 72 hours. No results for input(s): NA, K, CL, CO2, BUN, CREATININE, GLUCOSE, CALCIUM in the last 72 hours. No results for input(s): LABPT, INR in the last 72 hours.  Physical Exam: Neurologically intact ABD soft Intact pulses distally Incision: dressing C/D/I and no drainage Compartment soft  Assessment/Plan: Patient stable  xrays satisfactory Mobilization with physical therapy Encourage incentive spirometry Continue care  Advance diet Up with therapy  Plan on d/c to home today  Melina Schools, MD [place] [phone]

## 2015-12-03 NOTE — Addendum Note (Signed)
Addended by: Rudene Anda on: 12/03/2015 04:56 PM   Modules accepted: Orders

## 2015-12-03 NOTE — Evaluation (Signed)
Physical Therapy Evaluation Patient Details Name: Madeline Chandler MRN: [id number] DOB: 08/29/1960 Today's Date: 12/03/2015   History of Present Illness  Pt is a 55 y.o. female s/p C4-C7 ACDF. PMHx: High blood pressure, Anxiety, Heart murmur, GERD, Diverticulitis Of Colon, Breast cancer, Bil THA.   Clinical Impression  Patient presents with decreased mobility related to recent surgery, pain, precautions and progressing in session to allow good understanding of all precautions and mobility techniques covered in session.  No current follow up needs, but may benefit from outpatient PT referral once able to remove brace and move her neck more freely.    Follow Up Recommendations No PT follow up    Equipment Recommendations  None recommended by PT    Recommendations for Other Services       Precautions / Restrictions Precautions Precautions: Cervical Required Braces or Orthoses: Cervical Brace Cervical Brace: Hard collar;At all times;Other (comment) (off for bathing)      Mobility  Bed Mobility               General bed mobility comments: up in chair  Transfers Overall transfer level: Needs assistance Equipment used: None Transfers: Sit to/from Stand Sit to Stand: Supervision         General transfer comment: unaided up from chair  Ambulation/Gait Ambulation/Gait assistance: Supervision;Min guard Ambulation Distance (Feet): 250 Feet Assistive device: Rolling walker (2 wheeled);None Gait Pattern/deviations: Step-through pattern;Decreased stride length;Wide base of support     General Gait Details: initially without device with occasional minguard, but stable then pt requesting walker due to R thigh weakness; extra time needed for turns  Stairs Stairs: Yes Stairs assistance: Min guard Stair Management: Two rails;Forwards;Step to pattern Number of Stairs: 3 (4" steps) General stair comments: cues for technique with step to leading with L up first, and for feeling  edge of step with foot due to unable to visualize step with brace  Wheelchair Mobility    Modified Rankin (Stroke Patients Only)       Balance Overall balance assessment: No apparent balance deficits (not formally assessed)                                           Pertinent Vitals/Pain Pain Score: 7  Pain Location: head, throat Pain Descriptors / Indicators: Headache;Sore Pain Intervention(s): Monitored during session;Repositioned    Home Living Family/patient expects to be discharged to:: Private residence Living Arrangements: Other relatives (sister or can stay with mom) Available Help at Discharge: Family;Available 24 hours/day Type of Home: House (townhouse) Home Access: Stairs to enter   [name] of Steps: 1 Home Layout: One level Home Equipment: Environmental consultant - 2 wheels;Bedside commode;Shower seat;Cane - single point;Adaptive equipment Additional Comments: May d/c to mothers house depending on level of assist needed upon d/c. Mother can provide 24/7 supervision if needed.    Prior Function Level of Independence: Independent         Comments: Pt was working full time PTA. Pt reports she required increased time for dressing and bathing, but was able to perform mod I      Hand Dominance   Dominant Hand: Right    Extremity/Trunk Assessment   Upper Extremity Assessment: Defer to OT evaluation           Lower Extremity Assessment: Overall WFL for tasks assessed         Communication   Communication:  No difficulties  Cognition Arousal/Alertness: Awake/alert Behavior During Therapy: WFL for tasks assessed/performed Overall Cognitive Status: Within Functional Limits for tasks assessed                      General Comments General comments (skin integrity, edema, etc.): Educated with demo on stepping into tub, getting into car and gradual progress of activities    Exercises        Assessment/Plan    PT  Assessment Patent does not need any further PT services  PT Diagnosis Acute pain;Difficulty walking   PT Problem List    PT Treatment Interventions     PT Goals (Current goals can be found in the Care Plan section) Acute Rehab PT Goals Patient Stated Goal: To go home PT Goal Formulation: All assessment and education complete, DC therapy    Frequency     Barriers to discharge        Co-evaluation               End of Session Equipment Utilized During Treatment: Cervical collar;Gait belt Activity Tolerance: Patient tolerated treatment well Patient left: in chair;with call bell/phone within reach;with chair alarm set      Functional Assessment Tool Used: Clinical Judgement Functional Limitation: Mobility: Walking and moving around Mobility: Walking and Moving Around Current Status [id number]): At least 20 percent but less than 40 percent impaired, limited or restricted Mobility: Walking and Moving Around Goal Status [phone]): At least 1 percent but less than 20 percent impaired, limited or restricted Mobility: Walking and Moving Around Discharge Status [phone]): At least 1 percent but less than 20 percent impaired, limited or restricted    Time: 0935-1010 PT Time Calculation (min) (ACUTE ONLY): 35 min   Charges:   PT Evaluation $PT Eval Moderate Complexity: 1 Procedure PT Treatments $Gait Training: 8-22 mins   PT G Codes:   PT G-Codes **NOT FOR INPATIENT CLASS** Functional Assessment Tool Used: Clinical Judgement Functional Limitation: Mobility: Walking and moving around Mobility: Walking and Moving Around Current Status [id number]): At least 20 percent but less than 40 percent impaired, limited or restricted Mobility: Walking and Moving Around Goal Status [phone]): At least 1 percent but less than 20 percent impaired, limited or restricted Mobility: Walking and Moving Around Discharge Status [phone]): At least 1 percent but less than 20 percent impaired, limited or restricted     Reginia Naas 12/03/2015, 1:19 PM  Magda Kiel, [place] 12/03/2015

## 2015-12-03 NOTE — Evaluation (Signed)
Occupational Therapy Evaluation Patient Details Name: Madeline Chandler MRN: [id number] DOB: 1961/02/16 Today's Date: 12/03/2015    History of Present Illness Pt is a 55 y.o. female s/p C4-C7 ACDF. PMHx: High blood pressure, Anxiety, Heart murmur, GERD, Diverticulitis Of Colon, Breast cancer, Bil THA.    Clinical Impression   Pt reports she was independent with ADLs and mobility PTA. Currently pt overall min guard for short distance functional mobility and min guard-min assist for ADLs. Began cervical, safety, and ADL education; provided with cervical precautions handout. Pt planning to d/c home with 24/7 supervision from her mother initially. Pt would benefit from continued skilled OT to address established goals.    Follow Up Recommendations  No OT follow up;Supervision/Assistance - 24 hour (initially)    Equipment Recommendations  None recommended by OT    Recommendations for Other Services PT consult     Precautions / Restrictions Precautions Precautions: Cervical Precaution Comments: Educated pt on precautions and provided handout. Required Braces or Orthoses: Cervical Brace Cervical Brace: Hard collar;At all times;Other (comment) (off for bathing) Restrictions Weight Bearing Restrictions: No      Mobility Bed Mobility Overal bed mobility: Needs Assistance Bed Mobility: Rolling;Sidelying to Sit Rolling: Supervision Sidelying to sit: Supervision;HOB elevated       General bed mobility comments: Supervision for safety; no physical assist required. VCs for log roll technique. HOB elevated with use of bed rails.  Transfers Overall transfer level: Needs assistance Equipment used: None Transfers: Sit to/from Stand Sit to Stand: Min guard         General transfer comment: Min guard for safety; no physical assist required. Slight dizziness with sit to stand, resolved within a few seconds.     Balance Overall balance assessment: Needs assistance Sitting-balance  support: Feet supported;No upper extremity supported Sitting balance-Leahy Scale: Good     Standing balance support: No upper extremity supported Standing balance-Leahy Scale: Fair                              ADL Overall ADL's : Needs assistance/impaired Eating/Feeding: Set up;Sitting   Grooming: Min guard;Standing   Upper Body Bathing: Supervision/ safety;Sitting   Lower Body Bathing: Min guard;Sit to/from stand   Upper Body Dressing : Moderate assistance;Sitting Upper Body Dressing Details (indicate cue type and reason): to don cervical collar Lower Body Dressing: Minimal assistance;Sit to/from stand Lower Body Dressing Details (indicate cue type and reason): Pt able to cross one foot over opposite knee with some difficulty. Educated on compensatory strategies and use of AE that pt already has at home. Toilet Transfer: Min guard;Ambulation;BSC (BSC over toilet) Toilet Transfer Details (indicate cue type and reason): Simulated by transfer from EOB to chair. Toileting- Clothing Manipulation and Hygiene: Minimal assistance;Sit to/from stand       Functional mobility during ADLs: Min guard General ADL Comments: Educated pt on maintaining cervical precautions during functional activities, bed mobility technique, donning/doffing cervical collar.     Vision     Perception     Praxis      Pertinent Vitals/Pain Pain Assessment: 0-10 Pain Score: 5  Pain Location: head, bil shoulders Pain Descriptors / Indicators: Aching;Grimacing;Sore Pain Intervention(s): Limited activity within patient's tolerance;Monitored during session;Repositioned     Hand Dominance Right   Extremity/Trunk Assessment Upper Extremity Assessment Upper Extremity Assessment: Overall WFL for tasks assessed;LUE deficits/detail LUE Deficits / Details: Slight numbness in fingertips; present PTA and getting better per pt report.  Lower Extremity Assessment Lower Extremity Assessment: Defer to  PT evaluation   Cervical / Trunk Assessment Cervical / Trunk Assessment: Other exceptions Cervical / Trunk Exceptions: obesity   Communication Communication Communication: No difficulties   Cognition Arousal/Alertness: Awake/alert Behavior During Therapy: WFL for tasks assessed/performed Overall Cognitive Status: Within Functional Limits for tasks assessed                     General Comments       Exercises       Shoulder Instructions      Home Living Family/patient expects to be discharged to:: Private residence Living Arrangements: Other relatives (sister-works during the day) Available Help at Discharge: Family;Available 24 hours/day Type of Home: House (townhome) Home Access: Stairs to enter [name] of Steps: 1   Home Layout: One level     Bathroom Shower/Tub: Tub/shower unit;Curtain Shower/tub characteristics: Architectural technologist: Standard Bathroom Accessibility: Yes How Accessible: Accessible via walker Home Equipment: [place] - 2 wheels;Bedside commode;Shower seat;Cane - single point;Adaptive equipment Adaptive Equipment: Reacher;Long-handled shoe horn Additional Comments: May d/c to mothers house depending on level of assist needed upon d/c. Mother can provide 24/7 supervision if needed.      Prior Functioning/Environment Level of Independence: Independent             OT Diagnosis: Acute pain;Generalized weakness   OT Problem List: Impaired balance (sitting and/or standing);Decreased knowledge of use of DME or AE;Decreased knowledge of precautions;Obesity;Pain   OT Treatment/Interventions: Self-care/ADL training;Energy conservation;DME and/or AE instruction;Therapeutic activities;Patient/family education;Balance training    OT Goals(Current goals can be found in the care plan section) Acute Rehab OT Goals Patient Stated Goal: decrease pain then home OT Goal Formulation: With patient Time For Goal Achievement:  12/17/15 Potential to Achieve Goals: Good ADL Goals Pt Will Perform Grooming: with modified independence;standing Pt Will Perform Upper Body Bathing: with modified independence;sitting Pt Will Perform Lower Body Bathing: with modified independence;sit to/from stand Pt Will Transfer to Toilet: with modified independence;ambulating;bedside commode (BSC over toilet) Pt Will Perform Toileting - Clothing Manipulation and hygiene: with modified independence;sit to/from stand Pt Will Perform Tub/Shower Transfer: with supervision;Tub transfer;ambulating;shower seat Additional ADL Goal #1: Pt/caregiver will independently don/doff cervical collar as a precursor for ADLs and functional mobility.  OT Frequency: Min 2X/week   Barriers to D/C:            Co-evaluation              End of Session Equipment Utilized During Treatment: Cervical collar  Activity Tolerance: Patient tolerated treatment well Patient left: in chair;with call bell/phone within reach;with chair alarm set   Time: [id number] OT Time Calculation (min): 14 min Charges:  OT General Charges $OT Visit: 1 Procedure OT Evaluation $OT Eval Moderate Complexity: 1 Procedure G-Codes: OT G-codes **NOT FOR INPATIENT CLASS** Functional Assessment Tool Used: Clinical judgement Functional Limitation: Self care Self Care Current Status [id number]): At least 1 percent but less than 20 percent impaired, limited or restricted Self Care Goal Status [id number]): At least 1 percent but less than 20 percent impaired, limited or restricted   Binnie Kand M.S., OTR/L Pager: [phone]  12/03/2015, 8:09 AM

## 2015-12-03 NOTE — Op Note (Signed)
NAME:  Madeline Chandler, Madeline Chandler               ACCOUNT NO.:  [id number]  MEDICAL RECORD NO.:  [id number]  LOCATION:  5C09C                        FACILITY:  [facility]  PHYSICIAN:  Solveig Fangman D. Rolena Infante, M.D. DATE OF BIRTH:  [birth year]  DATE OF PROCEDURE:  12/02/2015 DATE OF DISCHARGE:                              OPERATIVE REPORT   PREOPERATIVE DIAGNOSIS:  Cervical spondylitic radiculopathy C4-C7.  POSTOPERATIVE DIAGNOSIS:  Cervical spondylitic radiculopathy C4-C7.  OPERATIVE PROCEDURE:  Anterior cervical diskectomy and fusion, C4-C7.  FIRST ASSISTANT:  Ronette Deter, my PA.  INSTRUMENTATION SYSTEM USED:  Titan nanoLOCK intervertebral cage packed with DBX mix.  We used a 6 medium lordotic cage at the 4-5 level and a 7 mm medium lordotic cage at 5-6 and 6-7.  Utilized a DePuy anterior cervical translational plate affixed with 16 mm screws at C4 and 14 mm screws at the remaining levels.  HISTORY:  This is a very pleasant, 55 year old woman who has been having progressive debilitating neck and neuropathic arm pain.  Attempts at conservative management consisting of injection therapy, medications, activity modification, and observation and physical therapy have failed to alleviate her symptoms.  Because of the progressive nature of her pain, we elected to proceed with surgery.  All appropriate risks, benefits, and alternatives to surgery were discussed with the patient and consent was obtained.  OPERATIVE NOTE:  The patient was brought to the operating room, placed supine on the operating room table.  After successful induction of general anesthesia and endotracheal intubation, TEDs, SCDs, and a Foley were inserted.  Inflatable tip was placed underneath this shoulder blades and the anterior cervical spine was prepped and draped in a sterile fashion.  Time-out was taken to confirm patient, procedure, and all other pertinent important data.  Once this was completed, x-ray was used to identify the C4  vertebral body and a longitudinal incision was mapped out on the skin.  This was then infiltrated with 0.25% Marcaine and a transverse incision was made.  Sharp dissection was carried out down to the platysma.  The platysma was sharply incised.  I then identified the sternocleidomastoid dissecting along the medial border of this.  I continued into the deep esophageal until I could identify the esophagus and the carotid sheath.  I protected the carotid sheath and mobilized the esophagus to the right and then began using Kittner dissectors to dissect through the remainder of the prevertebral fascia. This was a standard Smith-Robinson approach to the anterior cervical spine by a longitudinal incision.  Once I had the spine exposed, I placed a needle into the 4-5 disk space and took an intraoperative x-ray to confirm that I was at the appropriate level.  Once I confirmed the 4- 5 level, I then mobilized the longus colli muscles using bipolar electrocautery from the midbody of C4 down to the midbody of C7, this was done bilaterally.  Self-retaining Caspar retractors were then placed underneath the longus colli muscle.  The endotracheal cuff was deflated. I expanded the retractor and reinflated the endotracheal cuff.  I now had excellent exposure of the C6-7 disk space.  Annulotomy was performed using a 15 blade scalpel and then I used a combination of pituitary rongeurs,  curettes, and Kerrison rongeurs to remove all of the majority of the disk material.  I then removed the anterior osteophyte from the inferior aspect of the C6 vertebral body for better visualization.  Distraction pins were placed into the body of 6 and 7 and I gently distracted the space.  Using a fine neural hook and curette, I continued my dissection posteriorly.  I then removed the fragment of disk material from the posterolateral left corner, which allowed me to get my #1 Kerrison rongeur underneath the  uncovertebral joint and trimmed this down.  I then developed a plane using my fine nerve hook underneath the posterior longitudinal ligament.  I then used my 1 mm Kerrison to resect the PLL and complete my decompression and foraminotomy on the contralateral side.  Once an adequate decompression, I rasped the endplates and then used trialing devices to place the cage. The cage was well seated and well fitting.  I then repositioned my retractor at the 5-6 level and using the same technique, I performed a diskectomy at this level.  Again, I removed the PLL.  There was a significant central hard disk osteophyte at this level which after great care, I was able to resect and completely adequately decompress the spine.  At this point, I then placed my size 7 component in and again this had excellent fixation.  I then repositioned my distraction pins and using the same technique, did an ACDF at 4-5.  At this time, though a 6 mm cage was placed, as it provided better stabilization and it was easier then had a better fit.  With all 3 cages in place, I removed the distraction pins and irrigated copiously with normal saline.  I made sure I had hemostasis using bipolar electrocautery and then I placed the 48 mm length DePuy translational plate into the wound.  I fixed it to the bodies of C4 and with 16 mm screws self-drilling and 14 mm screws into C7.  I then placed 14 mm screws in the intervening segments.  All screws were then hand tightened down until they expanded the D ring and were self-locking.  This is per manufacture's standards.  Once this was complete, I irrigated the wound copiously with normal saline and then returned the esophagus to midline. The platysma was closed with a running barbed 2-0 suture and then superficial with 3-0 Monocryl.  Steri-Strips and a dry dressing were applied and the patient was extubated and transferred to the PACU without incident.  At the end of the case, all  needle and sponge counts were correct.  There were no intraoperative adverse events.     Amari Burnsworth D. Rolena Infante, M.D.     DDB/MEDQ  D:  12/02/2015  T:  12/03/2015  Job:  [id number]  cc:   Duane Lope D. Rolena Infante, M.D.'s Office

## 2015-12-03 NOTE — Telephone Encounter (Signed)
Called and discussed provider's recommendation for Pulmonary referral.  She agrees with the referral, but states that she just got home from having neck surgery and is unsure when she would be able to go for an appt.  Referral ordered and note regarding the same added to order.

## 2015-12-04 ENCOUNTER — Encounter (HOSPITAL_COMMUNITY): Payer: Self-pay | Admitting: Orthopedic Surgery

## 2015-12-14 NOTE — Discharge Summary (Signed)
Patient ID: LERLINE BAHLER MRN: [id number] DOB/AGE: 10-19-1960 55 y.o.  Admit date: 12/02/2015 Discharge date: 12/14/2015  Admission Diagnoses:  Active Problems:   Neck pain   Discharge Diagnoses:  Active Problems:   Neck pain  status post Procedure(s): ANTERIOR CERVICAL DECOMPRESSION/DISCECTOMY FUSION C4 - C7  3 LEVELS  Past Medical History  Diagnosis Date  . Hypertension   . History of breast cancer     right   . History of colonic polyps     Dr Collene Mares  . History of chemotherapy   . History of radiation therapy   . History of bronchitis   . Anxiety   . GERD (gastroesophageal reflux disease)   . Arthritis   . Cancer [facility])     breast cancer- right   . Bulging lumbar disc     2  . Restless leg syndrome   . Headache     hx  migraines    Surgeries: Procedure(s): ANTERIOR CERVICAL DECOMPRESSION/DISCECTOMY FUSION C4 - C7  3 LEVELS on 12/02/2015   Consultants:    Discharged Condition: Improved  Hospital Course: NAKEEMA LEITE is an 55 y.o. female who was admitted 12/02/2015 for operative treatment of <principal problem not specified>. Patient failed conservative treatments (please see the history and physical for the specifics) and had severe unremitting pain that affects sleep, daily activities and work/hobbies. After pre-op clearance, the patient was taken to the operating room on 12/02/2015 and underwent  Procedure(s): ANTERIOR CERVICAL DECOMPRESSION/DISCECTOMY FUSION C4 - C7  3 LEVELS.  The pt was discharged on 12/03/15.  Patient was given perioperative antibiotics:  Anti-infectives    Start     Dose/Rate Route Frequency Ordered Stop   12/02/15 2000  ceFAZolin (ANCEF) IVPB 1 g/50 mL premix     1 g 100 mL/hr over 30 Minutes Intravenous Every 8 hours 12/02/15 1652 12/03/15 0600   12/02/15 0800  ceFAZolin (ANCEF) IVPB 2g/100 mL premix     2 g 200 mL/hr over 30 Minutes Intravenous To [facility] 12/01/15 1250 12/02/15 1240       Patient was given  sequential compression devices and early ambulation to prevent DVT.   Patient benefited maximally from hospital stay and there were no complications. At the time of discharge, the patient was urinating/moving their bowels without difficulty, tolerating a regular diet, pain is controlled with oral pain medications and they have been cleared by PT/OT.   Recent vital signs: No data found.    Recent laboratory studies: No results for input(s): WBC, HGB, HCT, PLT, NA, K, CL, CO2, BUN, CREATININE, GLUCOSE, INR, CALCIUM in the last 72 hours.  Invalid input(s): PT, 2   Discharge Medications:     Medication List    STOP taking these medications        HYDROcodone-acetaminophen 5-325 MG tablet  Commonly known as:  NORCO/VICODIN     pregabalin 75 MG capsule  Commonly known as:  LYRICA      TAKE these medications        carvedilol 25 MG tablet  Commonly known as:  COREG  Take 1 tablet (25 mg total) by mouth 2 (two) times daily with a meal.     citalopram 40 MG tablet  Commonly known as:  CELEXA  TAKE ONE TABLET BY MOUTH ONCE DAILY     hydrochlorothiazide 25 MG tablet  Commonly known as:  HYDRODIURIL  TAKE ONE TABLET BY MOUTH ONCE DAILY     losartan 100 MG tablet  Commonly known  as:  COZAAR  Take 1 tablet (100 mg total) by mouth daily.     methocarbamol 500 MG tablet  Commonly known as:  ROBAXIN  Take 1 tablet (500 mg total) by mouth 3 (three) times daily as needed for muscle spasms.     ondansetron 4 MG tablet  Commonly known as:  ZOFRAN  Take 1 tablet (4 mg total) by mouth every 8 (eight) hours as needed for nausea or vomiting.     oxyCODONE-acetaminophen 10-325 MG tablet  Commonly known as:  PERCOCET  Take 1 tablet by mouth every 4 (four) hours as needed for pain.     pantoprazole 40 MG tablet  Commonly known as:  PROTONIX  Take 40 mg by mouth daily.        Diagnostic Studies: Dg Cervical Spine 2 Or 3 Views  12/02/2015  CLINICAL DATA:  Status post anterior  cervical disc fusion. EXAM: CERVICAL SPINE - 2-3 VIEW COMPARISON:  Same day. FINDINGS: Status post surgical anterior fusion of C4, C5, C6 and C7 with interbody fusion. Good alignment of vertebral bodies is noted. Postoperative changes are noted in the prevertebral soft tissues. IMPRESSION: Status post surgical anterior fusion of C4 through C7. Electronically Signed   By: Marijo Conception, M.D.   On: 12/02/2015 17:35   Dg Cervical Spine 2-3 Views  12/02/2015  CLINICAL DATA:  Cervical spine surgery. EXAM: CERVICAL SPINE - 2-3 VIEW; DG C-ARM GT 120 MIN COMPARISON:  No recent. FINDINGS: C4 through C7 anterior interbody fusion. Hardware intact. Good anatomic alignment. No acute abnormality. Three images obtained. 0 minutes 45 seconds fluoroscopy time . IMPRESSION: Anterior and interbody C4 through C7 fusion. Good anatomic alignment. Electronically Signed   By: Marcello Moores  Register   On: 12/02/2015 14:10   Dg C-arm Gt 120 Min  12/02/2015  CLINICAL DATA:  Cervical spine surgery. EXAM: CERVICAL SPINE - 2-3 VIEW; DG C-ARM GT 120 MIN COMPARISON:  No recent. FINDINGS: C4 through C7 anterior interbody fusion. Hardware intact. Good anatomic alignment. No acute abnormality. Three images obtained. 0 minutes 45 seconds fluoroscopy time . IMPRESSION: Anterior and interbody C4 through C7 fusion. Good anatomic alignment. Electronically Signed   By: Marcello Moores  Register   On: 12/02/2015 14:10          Follow-up Information    Follow up with Melina Schools D, MD. Schedule an appointment as soon as possible for a visit in 2 weeks.   Specialty:  Orthopedic Surgery   Why:  If symptoms worsen, For suture removal, For wound re-check   Contact information:   [address] [place] [address] [phone]       Discharge Plan:  discharge to home  Disposition:     Signed: Valinda Hoar for Dr. Melina Schools [facility] [phone] 12/14/2015, 3:00 PM

## 2015-12-26 IMAGING — DX DG HIP (WITH OR WITHOUT PELVIS) 1V PORT*L*
2 series · 2 of 2 positions shown · non-contrast
Comparison: 05/12/2015 pelvic radiograph

CLINICAL DATA: Left anterior approach total hip arthroplasty.

EXAM:
DG C-ARM 1-60 MIN-NO REPORT; DG HIP (WITH OR WITHOUT PELVIS) 1V PORT
LEFT

[pelvis ap]
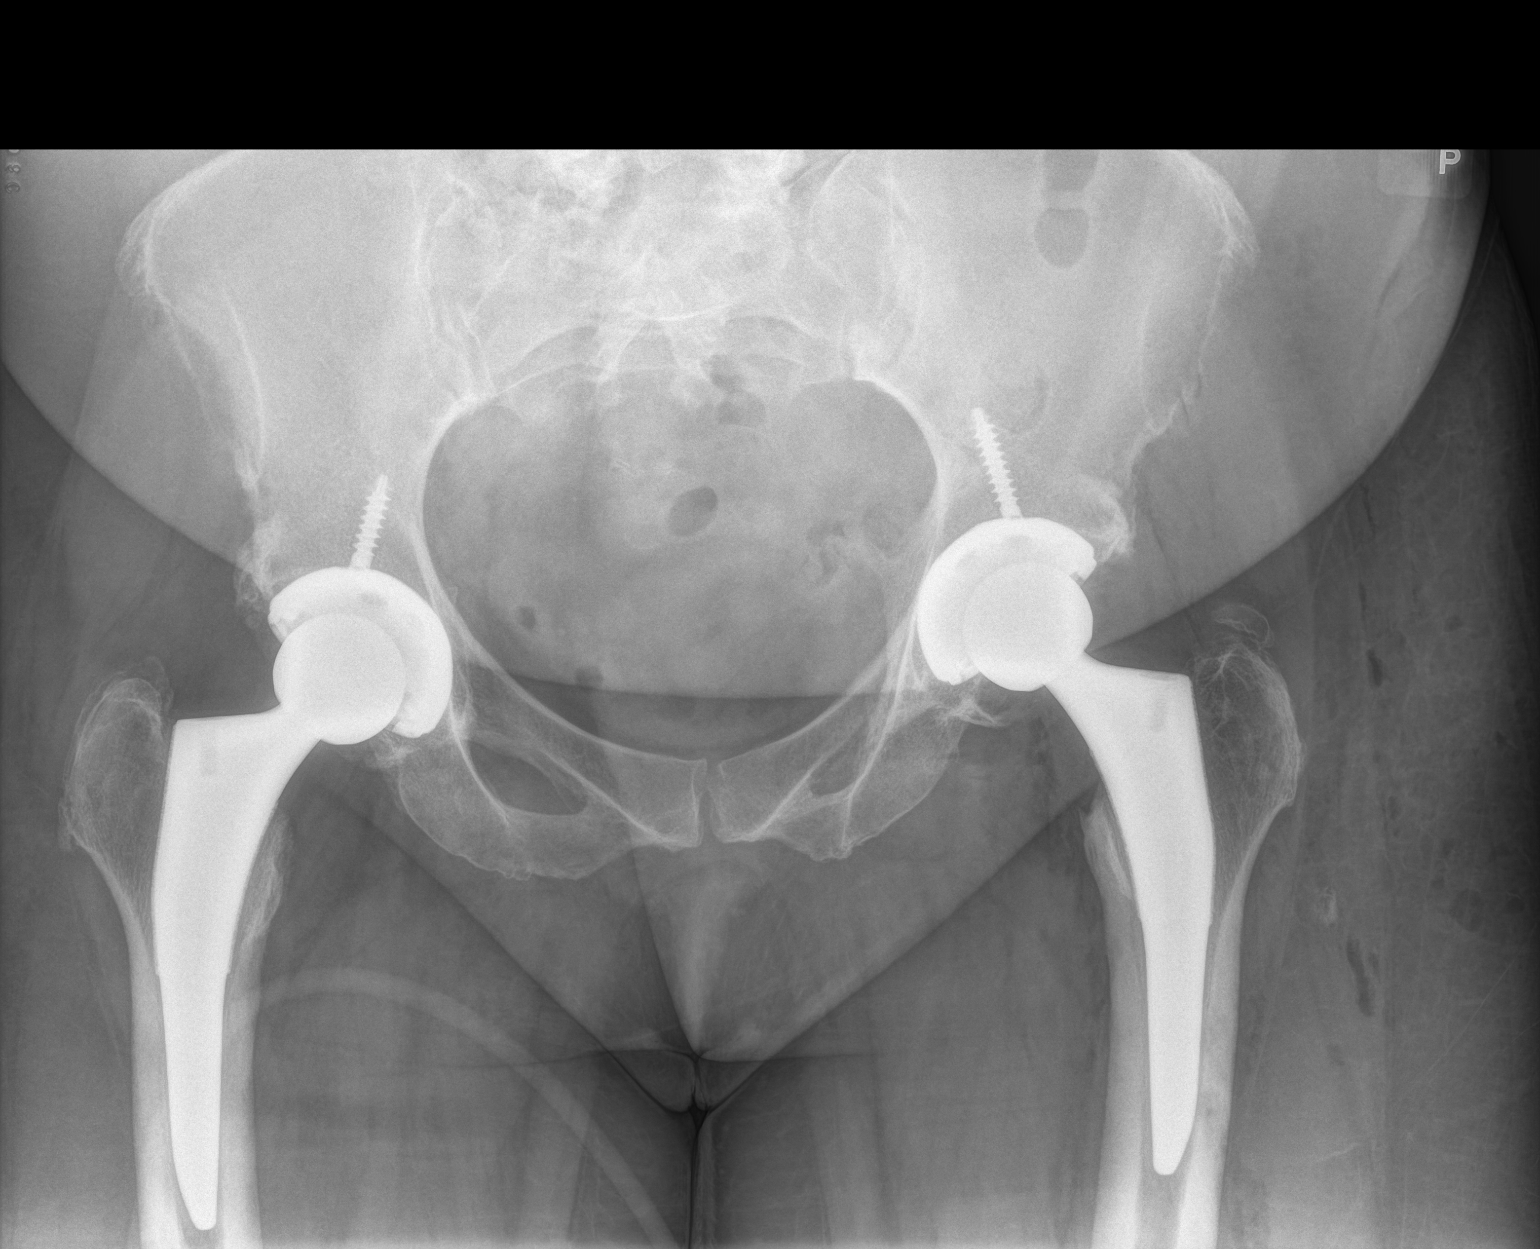

[hip x-table]
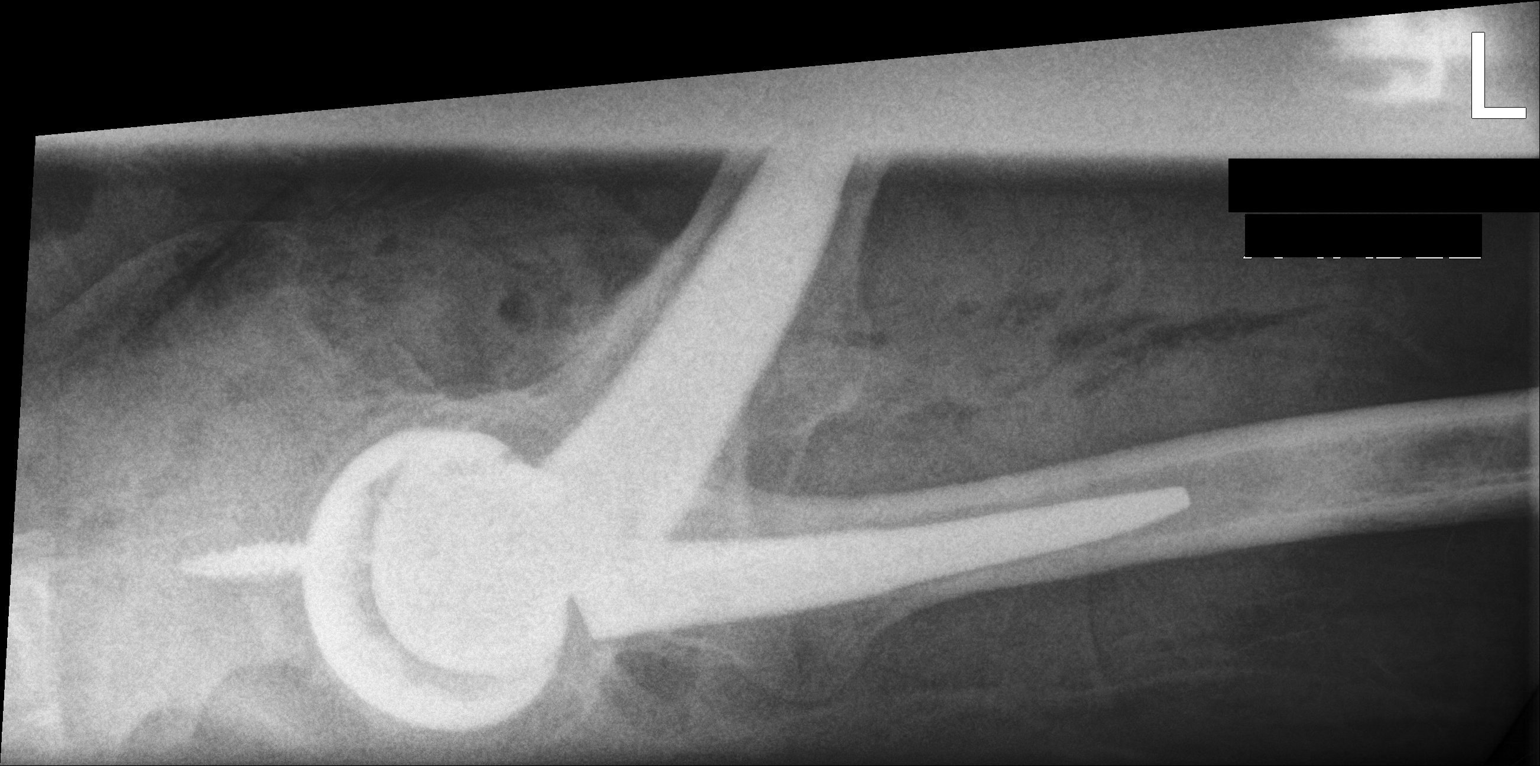

[2 of 2 positions shown; findings below may reference images not displayed]

FINDINGS: Fluoroscopy time 0.3 minutes. The patient is status post interval
left total hip arthroplasty, with well-positioned left acetabular
and left proximal femoral prostheses, with no evidence of hardware
fracture or loosening. No osseous fracture or suspicious focal
osseous lesion. No evidence of hip dislocation. There are expected
postsurgical changes about the left hip including soft tissue
stranding and gas. There is a stable appearance of the previous
right total hip arthroplasty, with no appreciable right hip hardware
complication.
IMPRESSION: Satisfactory appearance status post interval left total hip
arthroplasty.

## 2016-02-26 ENCOUNTER — Other Ambulatory Visit: Payer: Self-pay | Admitting: Physician Assistant

## 2016-02-26 NOTE — Telephone Encounter (Signed)
Rx request to pharmacy/[facility]  

## 2016-03-28 ENCOUNTER — Other Ambulatory Visit: Payer: Self-pay | Admitting: Physician Assistant

## 2016-03-28 NOTE — Telephone Encounter (Signed)
Rx request to pharmacy/[facility]  

## 2016-05-30 ENCOUNTER — Other Ambulatory Visit: Payer: Self-pay | Admitting: Physician Assistant

## 2016-05-30 NOTE — Telephone Encounter (Signed)
Rx request to pharmacy/[facility]  

## 2016-08-02 ENCOUNTER — Other Ambulatory Visit: Payer: Self-pay | Admitting: Physician Assistant

## 2016-08-05 ENCOUNTER — Ambulatory Visit (INDEPENDENT_AMBULATORY_CARE_PROVIDER_SITE_OTHER): Payer: 59 | Admitting: Family Medicine

## 2016-08-05 ENCOUNTER — Other Ambulatory Visit: Payer: Self-pay | Admitting: Obstetrics and Gynecology

## 2016-08-05 VITALS — BP 152/86 | HR 74 | Temp 98.3°F | Ht 66.0 in | Wt 248.2 lb

## 2016-08-05 DIAGNOSIS — R040 Epistaxis: Secondary | ICD-10-CM | POA: Diagnosis not present

## 2016-08-05 DIAGNOSIS — R928 Other abnormal and inconclusive findings on diagnostic imaging of breast: Secondary | ICD-10-CM

## 2016-08-05 DIAGNOSIS — J0100 Acute maxillary sinusitis, unspecified: Secondary | ICD-10-CM

## 2016-08-05 MED ORDER — BENZONATATE 100 MG PO CAPS: 100.0000 mg | ORAL_CAPSULE | Freq: Three times a day (TID) | ORAL | 0 refills | Status: DC | PRN

## 2016-08-05 NOTE — Progress Notes (Signed)
Chief Complaint  Patient presents with  . Sinusitis    c/o poss sinus infection and cough. Seen at [facility] the week for before Christmas for sinus infection. Pt states that cough has been present for a while. Occ nasal bleeding with sneezing and blowing nose.     Madeline Chandler is 55 y.o. female here for possible sinus infection.   Duration: 3 weeks Symptoms include: nasal congestion, cough, itchy/water eyes,  Denies: clear rhinorrhea, fevers, sore throats Treatment to date: Hycodan, Flonase, Augmentin (?) Sick contacts? No  ROS:  Const: Denies fevers HEENT: As noted in HPI  Past Medical History:  Diagnosis Date  . Anxiety   . Arthritis   . Bulging lumbar disc    2  . Cancer [facility])    breast cancer- right   . GERD (gastroesophageal reflux disease)   . Headache    hx  migraines  . History of breast cancer    right   . History of bronchitis   . History of chemotherapy   . History of colonic polyps    Dr Collene Mares  . History of radiation therapy   . Hypertension   . Restless leg syndrome    Social History   Social History  . Marital status: Single   Social History Main Topics  . Smoking status: Never Smoker  . Smokeless tobacco: Never Used  . Alcohol use No  . Drug use: No   Family History  Problem Relation Age of Onset  . Hypertension Mother   . Diabetes Mother   . Hypertension Sister   . Hypertension Brother     X 3  . Stroke Neg Hx   . Heart disease Neg Hx   . Breast cancer Mother     BP (!) 152/86   Pulse 74   Temp 98.3 F (36.8 C) (Oral)   Ht 5\' 6"  (1.676 m)   Wt 248 lb 3.2 oz (112.6 kg)   SpO2 97%   BMI 40.06 kg/m  Gen- Awake, alert, appears stated age HEENT- Ears patent, TM's neg b/l, nares are patent, septal mucosa excoriated bilaterally, varicosities noted b/l as well, sinuses are not TTP, MMM, pharynx is not erythematous or with exudate Heart- RRR, no murmur, no bruits Lungs- CTAB, no accessory muscle use Psych- Age appropriate judgment and  insight, nml mood and affect  Acute non-recurrent maxillary sinusitis - Plan: benzonatate (TESSALON) 100 MG capsule  Bleeding from the nose  Orders as above. She has already received abx for this issue. Focus on supportive care while symptoms resolve.  OTC remedies such as PO antihistamines and INCS spray recommended and listed in AVS. F/u prn. The patient voiced understanding and agreement to the plan.  [place], DO 08/05/16 11:17 AM

## 2016-08-05 NOTE — Patient Instructions (Addendum)
Claritin (loratadine), Allegra (fexofenadine), Zyrtec (cetirizine); these are listed in order from weakest to strongest. Generic, and therefore cheaper, options are in the parentheses.   Flonase (fluticasone); nasal spray that is over the counter. 2 sprays each nostril, once daily. Aim towards the same side eye when you spray.  There are available OTC, and the generic versions, which may be cheaper, are in parentheses. Show this to a pharmacist if you have trouble finding any of these items.  For nosebleeds- humidify air and use Vaseline or triple antibiotic ointment to keep moisture in your nose.

## 2016-08-05 NOTE — Progress Notes (Signed)
Pre visit review using our clinic review tool, if applicable. No additional management support is needed unless otherwise documented below in the visit note. 

## 2016-08-18 ENCOUNTER — Other Ambulatory Visit: Payer: Self-pay | Admitting: Obstetrics and Gynecology

## 2016-08-18 ENCOUNTER — Ambulatory Visit
Admission: RE | Admit: 2016-08-18 | Discharge: 2016-08-18 | Disposition: A | Discharge status: Home / Self Care | Payer: 59 | Source: Ambulatory Visit | Attending: Obstetrics and Gynecology | Admitting: Obstetrics and Gynecology

## 2016-08-18 DIAGNOSIS — R928 Other abnormal and inconclusive findings on diagnostic imaging of breast: Secondary | ICD-10-CM

## 2016-08-18 DIAGNOSIS — N6489 Other specified disorders of breast: Secondary | ICD-10-CM

## 2016-08-22 ENCOUNTER — Encounter: Payer: Self-pay | Admitting: Family Medicine

## 2016-08-22 ENCOUNTER — Telehealth: Payer: Self-pay | Admitting: Physician Assistant

## 2016-08-22 ENCOUNTER — Other Ambulatory Visit (HOSPITAL_COMMUNITY)
Admission: RE | Admit: 2016-08-22 | Discharge: 2016-08-22 | Disposition: A | Discharge status: Home / Self Care | Payer: 59 | Source: Ambulatory Visit | Attending: Family Medicine | Admitting: Family Medicine

## 2016-08-22 ENCOUNTER — Ambulatory Visit (INDEPENDENT_AMBULATORY_CARE_PROVIDER_SITE_OTHER): Payer: 59 | Admitting: Family Medicine

## 2016-08-22 VITALS — BP 164/88 | HR 83 | Temp 98.0°F | Ht 65.0 in | Wt 242.0 lb

## 2016-08-22 DIAGNOSIS — D489 Neoplasm of uncertain behavior, unspecified: Secondary | ICD-10-CM | POA: Diagnosis not present

## 2016-08-22 DIAGNOSIS — L918 Other hypertrophic disorders of the skin: Secondary | ICD-10-CM | POA: Diagnosis not present

## 2016-08-22 DIAGNOSIS — R030 Elevated blood-pressure reading, without diagnosis of hypertension: Secondary | ICD-10-CM | POA: Diagnosis not present

## 2016-08-22 DIAGNOSIS — D499 Neoplasm of unspecified behavior of unspecified site: Secondary | ICD-10-CM | POA: Diagnosis present

## 2016-08-22 NOTE — Telephone Encounter (Signed)
Patient request to transfer care from [place] to Dr. Wendling °

## 2016-08-22 NOTE — Patient Instructions (Addendum)
No washing for rest of day.  When you do wash, warm water and soap only. No hard scrubbing.  Things to look out for: Fevers, streaking redness, pus, worsening pain.  Give [place] around 1 week to get the results back.

## 2016-08-22 NOTE — Progress Notes (Signed)
Pre visit review using our clinic review tool, if applicable. No additional management support is needed unless otherwise documented below in the visit note. 

## 2016-08-22 NOTE — Progress Notes (Signed)
Chief Complaint  Patient presents with  . Arm Pain    Pt has elevated skin tag on RT side of arm and has doubled in size x1 day with redness to the area     Subjective: Patient is a 56 y.o. female here for skin issue.  The patient woke up today and noticed and engorged skin tag. She thinks it may have started to drain and she noticed a wet spot on her shirt sleeve. A decrease in size after noticing this. It is not painful and not actively draining. She remembers scratching around it yesterday. No known injury to reaction to area. Denies any fevers, warmth, pain, or redness.  ROS: Skin: As noted in HPI  Family History  Problem Relation Age of Onset  . Hypertension Mother   . Diabetes Mother   . Hypertension Sister   . Hypertension Brother     X 3  . Stroke Neg Hx   . Heart disease Neg Hx   . Breast cancer Mother    Past Medical History:  Diagnosis Date  . Anxiety   . Arthritis   . Bulging lumbar disc    2  . Cancer [facility])    breast cancer- right   . GERD (gastroesophageal reflux disease)   . Headache    hx  migraines  . History of breast cancer    right   . History of bronchitis   . History of chemotherapy   . History of colonic polyps    Dr Collene Mares  . History of radiation therapy   . Hypertension   . Restless leg syndrome    No Known Allergies  Current Outpatient Prescriptions:  .  ALPRAZolam (XANAX) 0.5 MG tablet, Take 0.5 mg by mouth 3 (three) times daily as needed for anxiety., Disp: , Rfl:  .  benzonatate (TESSALON) 100 MG capsule, Take 1 capsule (100 mg total) by mouth 3 (three) times daily as needed., Disp: 20 capsule, Rfl: 0 .  carvedilol (COREG) 25 MG tablet, TAKE ONE TABLET BY MOUTH TWICE DAILY WITH MEALS, Disp: 60 tablet, Rfl: 5 .  citalopram (CELEXA) 40 MG tablet, TAKE ONE TABLET BY MOUTH ONCE DAILY, Disp: 30 tablet, Rfl: 5 .  fluticasone (FLONASE) 50 MCG/ACT nasal spray, Place 2 sprays into both nostrils daily., Disp: , Rfl:  .  hydrochlorothiazide  (HYDRODIURIL) 25 MG tablet, TAKE ONE TABLET BY MOUTH ONCE DAILY, Disp: 30 tablet, Rfl: 3 .  HYDROcodone-homatropine (HYCODAN) 5-1.5 MG/5ML syrup, Take 5 mLs by mouth every 6 (six) hours as needed for cough., Disp: , Rfl:  .  losartan (COZAAR) 100 MG tablet, TAKE ONE TABLET BY MOUTH ONCE DAILY, Disp: 30 tablet, Rfl: 5 .  methocarbamol (ROBAXIN) 500 MG tablet, Take 1 tablet (500 mg total) by mouth 3 (three) times daily as needed for muscle spasms., Disp: 60 tablet, Rfl: 0 .  ondansetron (ZOFRAN) 4 MG tablet, Take 1 tablet (4 mg total) by mouth every 8 (eight) hours as needed for nausea or vomiting., Disp: 20 tablet, Rfl: 0 .  pantoprazole (PROTONIX) 40 MG tablet, Take 40 mg by mouth daily., Disp: , Rfl:   Objective: BP (!) 164/88 (BP Location: Left Arm, Patient Position: Sitting, Cuff Size: Large)   Pulse 83   Temp 98 F (36.7 C) (Oral)   Ht 5\' 5"  (1.651 m)   Wt 242 lb (109.8 kg)   SpO2 97%   BMI 40.27 kg/m  General: Awake, appears stated age Skin: On the anterior R arm, there is  an engorged mass with a small stalk. It is approx 1.6 x 0.7 cm in dimension. It is not fluctuant or TTP. There is no drainage. I am unable to express any material.  Psych: Age appropriate judgment and insight, normal affect and mood  Procedure note; shave biopsy Informed consent was obtained. The area was cleaned with alcohol and injected with 0.5 mL of 1% lidocaine with epinephrine. A straight hemostat was used to clamp down on the base of the lesion over the stalk. A 15 blade scalpel was used to remove the lesion. The specimen was placed in a sterile specimen cup and sent to the lab. The area was then electrically cauterized ensuring adequate hemostasis. The area was dressed with triple antibiotic ointment and a bandage. There were no complications noted. The patient tolerated the procedure well.  Assessment and Plan: Neoplasm of uncertain behavior - Plan: Surgical pathology, PR SHAV SKIN LES 1.1-2.0 CM  TRUNK,ARM,LEG  Elevated blood pressure reading  Orders as above. Appears to be an engorged acrochordon, but I cannot be sure. Discussed with patient and we agreed to send it to the lab to ensure it is nothing sinister. Aftercare instructions provided in AVS. Warning signs also detailed. Pt has f/u scheduled with me for BP. She was encouraged to keep this appt. The patient voiced understanding and agreement to the plan.  [place], DO 08/22/16  5:05 PM

## 2016-08-22 NOTE — Telephone Encounter (Signed)
OK 

## 2016-08-22 NOTE — Telephone Encounter (Signed)
Ok with me 

## 2016-08-23 ENCOUNTER — Ambulatory Visit
Admission: RE | Admit: 2016-08-23 | Discharge: 2016-08-23 | Disposition: A | Discharge status: Home / Self Care | Payer: 59 | Source: Ambulatory Visit | Attending: Obstetrics and Gynecology | Admitting: Obstetrics and Gynecology

## 2016-08-23 ENCOUNTER — Other Ambulatory Visit: Payer: Self-pay | Admitting: Obstetrics and Gynecology

## 2016-08-23 DIAGNOSIS — R928 Other abnormal and inconclusive findings on diagnostic imaging of breast: Secondary | ICD-10-CM

## 2016-08-23 DIAGNOSIS — N6489 Other specified disorders of breast: Secondary | ICD-10-CM

## 2016-08-31 ENCOUNTER — Encounter: Payer: Self-pay | Admitting: Genetic Counselor

## 2016-09-09 ENCOUNTER — Encounter: Payer: Self-pay | Admitting: Genetic Counselor

## 2016-09-16 DIAGNOSIS — D0511 Intraductal carcinoma in situ of right breast: Secondary | ICD-10-CM | POA: Insufficient documentation

## 2016-09-20 ENCOUNTER — Other Ambulatory Visit: Payer: 59

## 2016-09-20 ENCOUNTER — Ambulatory Visit (HOSPITAL_BASED_OUTPATIENT_CLINIC_OR_DEPARTMENT_OTHER): Payer: 59

## 2016-09-20 DIAGNOSIS — Z853 Personal history of malignant neoplasm of breast: Secondary | ICD-10-CM | POA: Diagnosis not present

## 2016-09-20 DIAGNOSIS — C50911 Malignant neoplasm of unspecified site of right female breast: Secondary | ICD-10-CM

## 2016-09-20 DIAGNOSIS — Z7183 Encounter for nonprocreative genetic counseling: Secondary | ICD-10-CM

## 2016-09-20 DIAGNOSIS — Z170 Estrogen receptor positive status [ER+]: Secondary | ICD-10-CM

## 2016-09-20 DIAGNOSIS — Z803 Family history of malignant neoplasm of breast: Secondary | ICD-10-CM | POA: Diagnosis not present

## 2016-09-20 NOTE — Progress Notes (Signed)
REFERRING PROVIDER: Shelda Pal, DO [address] [place], [place] [address]  PRIMARY PROVIDER:  Shelda Pal, DO  PRIMARY REASON FOR VISIT:  1. Malignant neoplasm of right breast in female, estrogen receptor positive, unspecified site of breast ([place])   2. Personal history of malignant neoplasm of breast   3. Family history of malignant neoplasm of breast      HISTORY OF PRESENT ILLNESS:   Madeline Chandler, a 56 y.o. female, was seen for a [place] cancer genetics consultation at the request of Dr. Nani Ravens due to a personal and family history of cancer.  [name] presents to clinic today to discuss the possibility of a hereditary predisposition to cancer, genetic testing, and to further clarify her future cancer risks, as well as potential cancer risks for family members.   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 She has been re-started on Tamoxifen and has been recommended to have a right mastectomy. Surgery is awaiting results of genetic testing.  In 1997, at the age of 42, [name] was diagnosed with invasive ductal carcinoma of the right breast. Treatment included a lumpectomy, chemotherapy, radiation and 5 years of Tamoxifen.   She has a history of multiple colon polyps, but was unsure of the total number. She has been told that some of them were precancerous. Her last colonoscopy was in 2010 and believes she is due for another one in a few more years.  [name] had a total hysterectomy in 2008 due to uterine fibroids.  CANCER HISTORY:   No history exists.     HORMONAL RISK FACTORS:  Ovaries intact: no.  Hysterectomy: yes.  Menopausal status: postmenopausal.  HRT use: 0 years. Colonoscopy: yes; abnormal. Mammogram within the last year: yes. Number of breast biopsies: 2. Up to date with pelvic exams:  n/a. Any excessive  radiation exposure in the past:  Not assessed  Past Medical History:  Diagnosis Date  . Anxiety   . Arthritis   . Bulging lumbar disc    2  . Cancer [facility])    breast cancer- right   . GERD (gastroesophageal reflux disease)   . Headache    hx  migraines  . History of breast cancer    right   . History of bronchitis   . History of chemotherapy   . History of colonic polyps    Dr Collene Mares  . History of radiation therapy   . Hypertension   . Restless leg syndrome     Past Surgical History:  Procedure Laterality Date  . ABDOMINAL HYSTERECTOMY    . ANTERIOR CERVICAL DECOMP/DISCECTOMY FUSION N/A 12/02/2015   Procedure: ANTERIOR CERVICAL DECOMPRESSION/DISCECTOMY FUSION C4 - C7  3 LEVELS;  Surgeon: Melina Schools, MD;  Location: [place];  Service: Orthopedics;  Laterality: N/A;  . BREAST LUMPECTOMY  1997  . COLONOSCOPY W/ POLYPECTOMY  2010   Dr Collene Mares  . DILATION AND CURETTAGE OF UTERUS    . TOTAL HIP ARTHROPLASTY Right 05/12/2015   Procedure: RIGHT TOTAL HIP ARTHROPLASTY ANTERIOR APPROACH;  Surgeon: Paralee Cancel, MD;  Location: [facility];  Service: Orthopedics;  Laterality: Right;  . TOTAL HIP ARTHROPLASTY Left 06/23/2015   Procedure: LEFT TOTAL HIP ARTHROPLASTY ANTERIOR APPROACH;  Surgeon: Paralee Cancel, MD;  Location: [facility];  Service: Orthopedics;  Laterality: Left;    Social History   Social History  .  Marital status: Single    Spouse name: N/A  . Number of children: N/A  . Years of education: N/A   Social History Main Topics  . Smoking status: Never Smoker  . Smokeless tobacco: Never Used  . Alcohol use No  . Drug use: No  . Sexual activity: Not on file   Other Topics Concern  . Not on file   Social History Narrative  . No narrative on file     FAMILY HISTORY:  We obtained a detailed, 4-generation family history.  Significant diagnoses are listed below: Family History  Problem Relation Age of Onset  . Hypertension Mother   . Diabetes Mother   . Breast cancer Mother 42   . Hypertension Sister   . Hypertension Brother     X 3  . Cancer Maternal Aunt     leukemia  . Breast cancer Paternal Aunt     diagnosed in 57s or 41s  . Cancer Paternal Uncle     unknown type  . Cancer Paternal Aunt     unknown type  . Breast cancer Cousin 33  . Stroke Neg Hx   . Heart disease Neg Hx     [name] is aware of previous family history of genetic testing for hereditary cancer risks. She reports her mother had negative genetic testing but was unsure which genes she was tested for. Patient is unsure of her ethnicity. There is no reported Ashkenazi Jewish ancestry. There is no known consanguinity.  GENETIC COUNSELING ASSESSMENT: Madeline Chandler is a 56 y.o. female with a personal and family history which is somewhat suggestive of a hereditary predisposition to cancer. We, therefore, discussed and recommended the following at today's visit.   DISCUSSION: We reviewed the characteristics, features and inheritance patterns of hereditary cancer syndromes. We also discussed genetic testing, including the appropriate family members to test, the process of testing, insurance coverage and turn-around-time for results. We discussed the implications of a negative, positive and/or variant of uncertain significant result. We recommended [name] pursue genetic testing for the common hereditary cancer gene panel given her personal and family history of breast cancer, her personal history of multiple colon polyp and family history of unknown types of cancer.  Based on [name]'s personal and family history of cancer, she meets medical criteria for genetic testing. Despite that she meets criteria, she may still have an out of pocket cost. We discussed that if her out of pocket cost for testing is over $100, the laboratory will call and confirm whether she wants to proceed with testing.  If the out of pocket cost of testing is less than $100 she will be billed by the genetic testing  laboratory.   PLAN: After considering the risks, benefits, and limitations, [name]  provided informed consent to pursue genetic testing and the blood sample was sent to [facility] for analysis of the Common Hereditary Cancer Panel genetic testing. Results should be available within approximately 2-3 weeks' time, at which point they will be disclosed by telephone to Ms. Cardy, as will any additional recommendations warranted by these results. Ms. Meehl will receive a summary of her genetic counseling visit and a copy of her results once available. This information will also be available in Epic. We encouraged Ms. Delahoz to remain in contact with cancer genetics annually so that we can continuously update the family history and inform her of any changes in cancer genetics and testing that may be of benefit for her  family. Ms. Monroe questions were answered to her satisfaction today. Our contact information was provided should additional questions or concerns arise.  Ms.  Sankey questions were answered to her satisfaction today. Our contact information was provided should additional questions or concerns arise. Thank you for the referral and allowing [place] to share in the care of your patient.   Benay Pike, MS, [facility] Certified Genetic Counselor  The patient was seen for a total of 45 minutes in face-to-face genetic counseling.  This patient was discussed with Drs. Magrinat, Lindi Adie and/or Burr Medico who agrees with the above.    _______________________________________________________________________ For Office Staff:  Number of people involved in session: 2 Was an Intern/ student involved with case: no

## 2016-09-21 ENCOUNTER — Ambulatory Visit (HOSPITAL_BASED_OUTPATIENT_CLINIC_OR_DEPARTMENT_OTHER): Payer: 59 | Admitting: Oncology

## 2016-09-21 ENCOUNTER — Telehealth: Payer: Self-pay | Admitting: Oncology

## 2016-09-21 VITALS — BP 181/76 | HR 73 | Temp 98.4°F | Resp 18 | Ht 65.0 in | Wt 245.5 lb

## 2016-09-21 DIAGNOSIS — Z170 Estrogen receptor positive status [ER+]: Secondary | ICD-10-CM

## 2016-09-21 DIAGNOSIS — C50219 Malignant neoplasm of upper-inner quadrant of unspecified female breast: Secondary | ICD-10-CM | POA: Diagnosis not present

## 2016-09-21 DIAGNOSIS — C50911 Malignant neoplasm of unspecified site of right female breast: Secondary | ICD-10-CM

## 2016-09-21 NOTE — Telephone Encounter (Signed)
Appointments scheduled per 2/14 LOS. Patient given AVS report and calendars with future scheduled appointments.  °

## 2016-09-21 NOTE — Progress Notes (Signed)
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 She was treated with a lumpectomy, radiation, and adjuvant chemotherapy. She completed 5 years of tamoxifen. She was last seen at the [facility] in 2006. Madeline Chandler reports feeling well. She had a routine mammogram 08/18/2016. In the medial right breast a suspicious area of distortion was noted with associated calcifications. A firm "lump "was noted at the 1:00 position. An ultrasound demonstrated an area of subtle shadowing at the 1:00 position without a discrete measurable mass. One normal-appearing lymph node was noted in the right axilla.  She underwent a stereotactic core biopsy of the area of distortion in the upper inner right breast on 08/23/2016. A marker clip was placed.  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 There is an associated microscopic focus of invasive carcinoma. The was insufficient invasive tumor for a breast prognostic profile. The DCIS returned estrogen receptor positive and progesterone receptor positive. The HER-2 was also positive.  She saw Dr. Lucia Gaskins and is being scheduled for a mastectomy procedure. She also saw plastic surgery and the plan is to perform an implant reconstruction. She has been referred to the genetics counselor.  She was started on tamoxifen by Dr. Lucia Gaskins.  Objective:  Vital signs in last 24 hours:  Blood pressure (!) 181/76, pulse 73, temperature 98.4 F (36.9 C), temperature source Oral, resp. rate 18, height _0  (1.651 m), weight 245 lb 8 oz (111.4 kg), SpO2 98 %.    HEENT: Neck without mass Lymphatics: No cervical, supraclavicular, or axillary nodes Resp: Lungs clear bilaterally Cardio: Regular rate and rhythm GI: No hepatomegaly Vascular: No leg edema Breasts: Status post right lumpectomy. No evidence of local tumor  recurrence. At the 1-2:00 position of the right breast there is a firm nodule corresponding to the biopsy site. No other mass in either breast. Both axillae appear benign.    Medications: I have reviewed the patient's current medications.  Assessment/Plan:  1. Stage II (sees T2 N0) sees right-sided breast cancer diagnosed in September 1997, status post a lumpectomy and radiation on the by adjuvant chemotherapy. She completed 5 years of tamoxifen.  2.  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 Hysterectomy and bilateral oophorectomy April 2008  Disposition:  Ms. 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 This is in a different quadrant of the right breast from the invasive carcinoma in 1997.  We discussed treatment options. She will not be a candidate for further radiation given the previous history of right breast radiation. Dr. Lucia Gaskins recommends proceeding with a mastectomy. She has been evaluated by plastic surgery and the plan is to proceed with a right tissue expander and latissimus flap. She is scheduled to see the genetics counselor prior to surgery in the case she would benefit from a bilateral mastectomy. I recommended she discontinue tamoxifen. I will see her after mastectomy to review the pathology and discuss adjuvant treatment options. I suspect she will be a candidate for an aromatase inhibitor. She will most likely not require adjuvant chemotherapy or Herceptin.  We are attempting to obtain the records from the course of  chemotherapy in 1997.  Madeline Coder, MD  09/21/2016  1:04 PM

## 2016-09-21 NOTE — Telephone Encounter (Signed)
Pt cld back today and has agreed to see Dr. Benay Spice 2/14 at 1130am. Aware to arrive 30 minutes early in order to get checked in on time.

## 2016-09-30 ENCOUNTER — Telehealth: Payer: Self-pay | Admitting: Genetic Counselor

## 2016-09-30 NOTE — Telephone Encounter (Signed)
Discussed results with patient

## 2016-10-01 ENCOUNTER — Ambulatory Visit: Payer: Self-pay | Admitting: Genetic Counselor

## 2016-10-01 DIAGNOSIS — C50911 Malignant neoplasm of unspecified site of right female breast: Secondary | ICD-10-CM

## 2016-10-01 DIAGNOSIS — Z170 Estrogen receptor positive status [ER+]: Principal | ICD-10-CM

## 2016-10-01 DIAGNOSIS — Z853 Personal history of malignant neoplasm of breast: Secondary | ICD-10-CM

## 2016-10-01 DIAGNOSIS — Z1379 Encounter for other screening for genetic and chromosomal anomalies: Secondary | ICD-10-CM | POA: Insufficient documentation

## 2016-10-01 DIAGNOSIS — Z803 Family history of malignant neoplasm of breast: Secondary | ICD-10-CM

## 2016-10-01 NOTE — Progress Notes (Signed)
[facility]        Cancer Genetics Clinic    Patient Name: Madeline Chandler Patient DOB: 02/19/1961 Patient Age: 55 y.o. Encounter Date: 10/01/2016  Referring Provider: David Newman, MD  Primary Care Provider: Nicholas Paul Wendling, DO  Ms. Loudermilk was called today to discuss genetic test results. Please see the Genetics note from her visit on 09/20/2016 for a detailed discussion of her personal and family history.  Genetic Testing: At the time of Ms. Morr's visit, we recommended she pursue genetic testing of multiple genes associated with a hereditary predisposition to cancer. Testing included sequencing and deletion/duplication analysis of 43 genes on [facility]'s Common Cancers panel (APC, ATM, AXIN2, BARD1, BMPR1A, BRCA1, BRCA2, BRIP1, CDH1, CDKN2A, CHEK2, DICER1, EPCAM, GREM1, HOXB13, KIT, MEN1, MLH1, MSH2, MSH6, MUTYH, NBN, NF1, PALB2, PDGFRA, PMS2, POLD1, POLE, PTEN, RAD50, RAD51C, RAD51D, SDHA, SDHB, SDHC, SDHD, SMAD4, SMARCA4, STK11, TP53, TSC1, TSC2, VHL). Testing was normal and did not reveal a mutation in these genes. A copy of the genetic test report will be scanned into Epic under the media tab.  Since the current test is not perfect, it is possible there may be a gene mutation that current testing cannot detect, but that chance is small. We also discussed that it is possible that a different genetic factor, which was not part of this testing or has not yet been discovered, is responsible for the cancer diagnoses in the family. Again, the likelihood of this is low. No additional testing is recommended at this time.   Cancer Screening: Given the personal and family histories, we must interpret these negative results with some caution. Families with features suggestive of hereditary risk tend to have multiple family members with cancer, diagnoses in multiple generations and diagnoses before the age of 50. Ms. Lansberry's family exhibits some of these features. This result  may simply reflect our current inability to detect all mutations within these genes. It is also possible there is a different gene, that we have not yet tested, that may be responsible for the cancer in this family.   Family Members: Given the young age of breast cancer in the family, women are recommended to have a yearly mammogram beginning around age 30, which is 10 years earlier than the youngest breast cancer in the family and discuss with their own provider whether there is a benefit to also having a breast MRI. Women should also have a clinical breast exam every 6-12 months, a yearly gynecologic exam and perform monthly breast self-exams. Colon cancer screening is recommended to begin by age 50 in both men and women.  Lastly, cancer genetics is a rapidly advancing field and it is possible that new genetic tests will be appropriate for her in the future. We encourage her to remain in contact with us on an annual basis so we can update her personal and family histories, and let her know of advances in cancer genetics that may benefit the family. Our contact number was provided. Ms. Yokoyama is welcome to call anytime with additional questions.     , MS, CGC Certified Genetic Counselor phone: [phone] [email or web address]   

## 2016-10-03 ENCOUNTER — Other Ambulatory Visit: Payer: Self-pay | Admitting: Surgery

## 2016-10-13 ENCOUNTER — Telehealth: Payer: Self-pay | Admitting: Oncology

## 2016-10-13 NOTE — Telephone Encounter (Signed)
Pt called to r/s follow up appt to 4/6 due to surgery is on 3/23. Pt has new appt date/time

## 2016-10-18 NOTE — H&P (Signed)
Subjective:     Patient ID: Madeline Chandler is a 56 y.o. female.  HPI  Referred by Dr. Lucia Gaskins for consultation for breast reconstruction. PMH significant for history right breast ca diagnosed 1997, treated with lumpectomy and adjuvant chemotherapy and radiation, 5 years tamoxifen. Routine follow up imaging 08/2016 with right breast distortion. MMG/US with area of concern right breast 1 o clock. 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 Given history, mastectomy has been recommended.  Per Dr. Lucia Gaskins notes, 20 LN removed in past so may not be able to complete SLN. Genetics negative. Dr. Benay Spice is her Oncologist. Started on tamoxifen as we await surgery.   Lives with sister. Works in Statistician at [employer].   Prior to lumpectomy B cup, current D cup on left. Has used insert for right in past.  Review of Systems 12 point review negative    Objective:   Physical Exam   Cardiovascular: Normal rate. normal heart sounds Pulmonary/Chest: Effort normal.  Clear to auscultation Abdominal: Soft.  obese  Lymphadenopathy:    She has no axillary adenopathy.  Neurological: She is alert and oriented to person, place, and time.  Skin:  Fitzpatrick 5   Right breast with induration area of biopsy (no palpable mass prior to bx) Right<<left volume breast  Grade 3 ptosis left SN to nipple R 25 L 33 cm BW R 15 L 22 Nipple to IMF R 6 L 13 cm    Assessment:     DCIS right breast, microscopic focus IDC UIQ ER/PR+, Her2+ History right breast ca, s/p lumpectomy, XRT    Plan:     Reviewed options prosthesis and provided brochure for [facility]. Discussed immediate vs delayed, autologous vs implant based reconstruction. Reviewed incisions, drains, OR length, hospital stay and recovery for each. Discussed process of expansion and implant based risks including rupture, MRI surveillance for silicone implants, infection requiring  surgery or removal, contracture. Discussed asymmetry one can expect with implant unilateral and natural breast on opposite. Discussed TRAM vs DIEP, latter would require treatment at tertiary center. Discussed abdominal based complications including failure flap, abdominal bulge or hernia. At her current weight I would consider her high risk for abdominal based reconstruction. If purely autologous reconstruction is her desire, recommend delayed reconstruction and wt loss.  Reviewed in setting radiation will have higher risks complications including wound healing problems, contracture.Counseled autologous reconstruction as part of reconstruction would benefit her in reducing these risks to baseline. Without flap, expander alone post radiation may not expand as easily, have greater wound healing problems, extrusion. Discussed role of Latissismus flap and use of expander. Discussed future surgery for symmetry procedures. Reviewed portfolio, examples of expanders and implants.  She desires to proceed with right tissue expander and latissimus flap.  The risks that can be encountered with latissimus flap and after placement of a breast expander placement were discussed and include the following but not limited to these: bleeding, infection, delayed healing, anesthesia risks, skin sensation changes, injury to structures including nerves, blood vessels, and muscles which may be temporary or permanent, allergies to tape, suture materials and glues, blood products, topical preparations or injected agents, skin contour irregularities, skin discoloration and swelling, deep vein thrombosis, cardiac and pulmonary complications, pain, which may persist, fluid accumulation, wrinkling of the skin over the expander, changes in nipple or breast sensation, expander leakage or rupture, faulty position of the expander, persistent pain, formation of tight scar tissue around the  expander (capsular contracture), possible need for  revisional surgery or staged procedures.  The patient was given prescriptions for Norco, Valium and Keflex and instructed that these medications were for post operative management only.   Irene Limbo, MD [facility] Plastic & Reconstructive Surgery [phone], pin [phone]

## 2016-10-21 ENCOUNTER — Encounter (HOSPITAL_COMMUNITY)
Admission: RE | Admit: 2016-10-21 | Discharge: 2016-10-21 | Disposition: A | Discharge status: Home / Self Care | Payer: 59 | Source: Ambulatory Visit | Attending: Surgery | Admitting: Surgery

## 2016-10-21 ENCOUNTER — Encounter (HOSPITAL_COMMUNITY): Payer: Self-pay

## 2016-10-21 DIAGNOSIS — C50911 Malignant neoplasm of unspecified site of right female breast: Secondary | ICD-10-CM | POA: Insufficient documentation

## 2016-10-21 DIAGNOSIS — Z01818 Encounter for other preprocedural examination: Secondary | ICD-10-CM | POA: Diagnosis present

## 2016-10-21 LAB — CBC
HEMATOCRIT: 38.7 % (ref 36.0–46.0)
HEMOGLOBIN: 12.5 g/dL (ref 12.0–15.0)
MCH: 28.2 pg (ref 26.0–34.0)
MCHC: 32.3 g/dL (ref 30.0–36.0)
MCV: 87.4 fL (ref 78.0–100.0)
Platelets: 221 10*3/uL (ref 150–400)
RBC: 4.43 MIL/uL (ref 3.87–5.11)
RDW: 14.4 % (ref 11.5–15.5)
WBC: 6.5 10*3/uL (ref 4.0–10.5)

## 2016-10-21 LAB — BASIC METABOLIC PANEL
ANION GAP: 7 (ref 5–15)
BUN: 7 mg/dL (ref 6–20)
CHLORIDE: 103 mmol/L (ref 101–111)
CO2: 28 mmol/L (ref 22–32)
Calcium: 9.4 mg/dL (ref 8.9–10.3)
Creatinine, Ser: 0.85 mg/dL (ref 0.44–1.00)
GFR calc Af Amer: >60 mL/min (ref >60)
Glucose, Bld: 86 mg/dL (ref 65–99)
POTASSIUM: 3.8 mmol/L (ref 3.5–5.1)
SODIUM: 138 mmol/L (ref 135–145)

## 2016-10-21 NOTE — Progress Notes (Addendum)
PCP is Dr. Crosby Oyster Wendiling Denies ever seeing a cardiologist. Denies any chest pain. Denies ever having a card cath, echo, or stress test

## 2016-10-21 NOTE — Pre-Procedure Instructions (Signed)
Madeline Chandler  10/21/2016      [facility] [address], [place] [address] Phone: [phone] Fax: [phone]    Your procedure is scheduled on March 23  Report to [facility] Admitting at 0800 A.M.  Call this number if you have problems the morning of surgery:  [phone]   Remember:  Do not eat food or drink liquids after midnight.   Take these medicines the morning of surgery with A SIP OF WATER carvedilol (COREG), fluticasone (FLONASE),   7 days prior to surgery STOP taking any Aspirin, Aleve, Naproxen, Ibuprofen, Motrin, Advil, Goody's, BC's, all herbal medications, fish oil, and all vitamins]    Do not wear jewelry, make-up or nail polish.  Do not wear lotions, powders, or perfumes, or deoderant.  Do not shave 48 hours prior to surgery.    Do not bring valuables to the hospital.  [facility] is not responsible for any belongings or valuables.  Contacts, dentures or bridgework may not be worn into surgery.  Leave your suitcase in the car.  After surgery it may be brought to your room.  For patients admitted to the hospital, discharge time will be determined by your treatment team.  Patients discharged the day of surgery will not be allowed to drive home.    Special instructions:   [facility]- Preparing For Surgery  Before surgery, you can play an important role. Because skin is not sterile, your skin needs to be as free of germs as possible. You can reduce the number of germs on your skin by washing with CHG (chlorahexidine gluconate) Soap before surgery.  CHG is an antiseptic cleaner which kills germs and bonds with the skin to continue killing germs even after washing.  Please do not use if you have an allergy to CHG or antibacterial soaps. If your skin becomes reddened/irritated stop using the CHG.  Do not shave (including legs and underarms) for at least 48 hours prior to first  CHG shower. It is OK to shave your face.  Please follow these instructions carefully.   1. Shower the NIGHT BEFORE SURGERY and the MORNING OF SURGERY with CHG.   2. If you chose to wash your hair, wash your hair first as usual with your normal shampoo.  3. After you shampoo, rinse your hair and body thoroughly to remove the shampoo.  4. Use CHG as you would any other liquid soap. You can apply CHG directly to the skin and wash gently with a scrungie or a clean washcloth.   5. Apply the CHG Soap to your body ONLY FROM THE NECK DOWN.  Do not use on open wounds or open sores. Avoid contact with your eyes, ears, mouth and genitals (private parts). Wash genitals (private parts) with your normal soap.  6. Wash thoroughly, paying special attention to the area where your surgery will be performed.  7. Thoroughly rinse your body with warm water from the neck down.  8. DO NOT shower/wash with your normal soap after using and rinsing off the CHG Soap.  9. Pat yourself dry with a CLEAN TOWEL.   10. Wear CLEAN PAJAMAS   11. Place CLEAN SHEETS on your bed the night of your first shower and DO NOT SLEEP WITH PETS.    Day of Surgery: Do not apply any deodorants/lotions. Please wear clean clothes to the hospital/surgery center.      Please read  over the following fact sheets that you were given.

## 2016-10-21 NOTE — Progress Notes (Signed)
   10/21/16 1323  OBSTRUCTIVE SLEEP APNEA  Have you ever been diagnosed with sleep apnea through a sleep study? No  Do you snore loudly (loud enough to be heard through closed doors)?  1  Do you often feel tired, fatigued, or sleepy during the daytime (such as falling asleep during driving or talking to someone)? 0  Has anyone observed you stop breathing during your sleep? 0  Do you have, or are you being treated for high blood pressure? 1  BMI more than 35 kg/m2? 1  Age > 50 (1-yes) 1  Neck circumference greater than:Female 16 inches or larger, Female 17inches or larger? 1  Female Gender (Yes=1) 0  Obstructive Sleep Apnea Score 5  Score 5 or greater  Results sent to PCP

## 2016-10-26 ENCOUNTER — Ambulatory Visit: Payer: 59 | Admitting: Nurse Practitioner

## 2016-10-27 NOTE — H&P (Signed)
Madeline Chandler  Location: [facility] Surgery Patient #: [id number] DOB: 1961/04/02 Single / Language: [name] / Race: Black or African American Female  History of Present Illness   The patient is a 56 year old female who presents with a complaint of Chandler breast cancer.   The PCP is Dr. Riki Sheer [facility])  The patient was referred by self.  She comes by herself.  [name] is a patient I took care of for a Chandler breast cancer in 1997. She saw Dr. Benay Spice who gave her chemotx and tamoxifen for 5 years, and Dr. Valere Dross who did radiation. She admits that she was not following very long on the after surgery. I do not have an old chart on her. She had been doing well  regarding her breast until recently. She does have a Chandler breast which is significant smaller than her left breast secondary to the surgery and radiation. She is unsure if she has had genetic testing. We were not doing that in the late 1990's. She had a mammogram on 08/18/2016 at [facility]. She underwent a Chandler breast biopsy on 08/23/2016. The biopsy ([id number]) showed IDC, ER - 100%, PR - 30%, Her2Neu - positive. There was insufficient tissue for Ki67.  I discussed the options for breast cancer treatment with the patient. At this time, I think that her only option is Chandler mastectomy. She said that I removed about 20 lymph nodes last time, so it is unlikely that I will find a SLNode. I discussed the possibility of reconstruction. I discussed the options of lymph node biopsy.  The risks of surgery include, but are not limited to, bleeding, infection, the need for further surgery, and nerve injury. I gave her a copy of her path report.  Plan: 1) Plastic surgery consult, 2) Genetics consult, 3) Consult with Dr. Ammie Dalton, 4) I will start her on Tamoxifen while she is getting all her appts.  [I spoke to Dr. Jacinto Reap. Sherrill after she left. He said that he  would be happy to see her in consultation. Per his notes, she was a T2, N0 Chandler breast cancer in 1997.]  Past Medical History: 1. TAHBSO - 11/13/2006 - M Grewal 2. Both hips replaced Alvan Dame Oct and Nov 2016. 3. Neck surgery Rolena Infante - April 2017 4. HTN 5. Colonoscopy - 2014 - Dr. Collene Mares  Social History: Never married. 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   Past Surgical History (April Staton, CMA; 09/08/2016 1:33 PM) Breast Biopsy  Chandler. Breast Mass; Local Excision  Chandler. Colon Polyp Removal - Colonoscopy  Hip Surgery  Bilateral. Hysterectomy (due to cancer) - Complete  Spinal Surgery - Neck   Diagnostic Studies History (April Staton, CMA; 09/08/2016 1:33 PM) Colonoscopy  1-5 years ago Mammogram  within last year Pap Smear  1-5 years ago  Allergies (April Staton, CMA; 09/08/2016 1:33 PM) No Known Drug Allergies 09/08/2016  Medication History (April Staton, CMA; 09/08/2016 1:35 PM) ALPRAZolam (0.5MG Tablet, Oral) Active. Coreg (25MG Tablet, Oral) Active. CeleXA (40MG Tablet, Oral) Active. Flonase (50MCG/ACT Suspension, Nasal) Active. HydroCHLOROthiazide (25MG Tablet, Oral) Active. Hydrocodone-Homatropine (5-1.5MG/5ML Syrup, Oral) Active. Losartan Potassium (100MG Tablet, Oral) Active. Medications Reconciled  Social History (April Staton, [place]; 09/08/2016 1:33 PM) Alcohol use  Occasional alcohol use. Caffeine use  Coffee, Tea. No drug use  Tobacco use  Never  smoker.  Family History (April Staton, [place]; 09/08/2016 1:33 PM) Arthritis  Mother, Sister. Breast Cancer  Family Members In General, Mother. Cerebrovascular Accident  Mother. Depression  Mother. Diabetes Mellitus  Mother. Hypertension  Brother, Father, Mother, Sister. Thyroid problems  Mother.  Pregnancy / Birth History (April Staton, [place];  09/08/2016 1:33 PM) Age at menarche  73 years. Age of menopause  27-50 Gravida  2 Irregular periods  Maternal age  29-35 Para  0  Other Problems (April Staton, [place]; 09/08/2016 1:33 PM) Anxiety Disorder  Back Pain  Breast Cancer  Heart murmur  High blood pressure  Lump In Breast  Migraine Headache     Review of Systems (April Staton CMA; 09/08/2016 1:33 PM) General Not Present- Appetite Loss, Chills, Fatigue, Fever, Night Sweats, Weight Gain and Weight Loss. Skin Not Present- Change in Wart/Mole, Dryness, Hives, Jaundice, New Lesions, Non-Healing Wounds, Rash and Ulcer. HEENT Present- Seasonal Allergies, Sinus Pain and Wears glasses/contact lenses. Not Present- Earache, Hearing Loss, Hoarseness, Nose Bleed, Oral Ulcers, Ringing in the Ears, Sore Throat, Visual Disturbances and Yellow Eyes. Respiratory Present- Chronic Cough. Not Present- Bloody sputum, Difficulty Breathing, Snoring and Wheezing. Breast Present- Breast Mass. Not Present- Breast Pain, Nipple Discharge and Skin Changes. Cardiovascular Not Present- Chest Pain, Difficulty Breathing Lying Down, Leg Cramps, Palpitations, Rapid Heart Rate, Shortness of Breath and Swelling of Extremities. Gastrointestinal Not Present- Abdominal Pain, Bloating, Bloody Stool, Change in Bowel Habits, Chronic diarrhea, Constipation, Difficulty Swallowing, Excessive gas, Gets full quickly at meals, Hemorrhoids, Indigestion, Nausea, Rectal Pain and Vomiting. Female Genitourinary Not Present- Frequency, Nocturia, Painful Urination, Pelvic Pain and Urgency. Musculoskeletal Present- Back Pain. Not Present- Joint Pain, Joint Stiffness, Muscle Pain, Muscle Weakness and Swelling of Extremities. Neurological Not Present- Decreased Memory, Fainting, Headaches, Numbness, Seizures, Tingling, Tremor, Trouble walking and Weakness. Psychiatric Present- Anxiety. Not Present- Bipolar, Change in Sleep Pattern, Depression, Fearful and Frequent  crying. Hematology Not Present- Blood Thinners, Easy Bruising, Excessive bleeding, Gland problems, HIV and Persistent Infections.  Vitals (April Staton CMA; 09/08/2016 1:36 PM) 09/08/2016 1:35 PM Weight: 241.13 lb Height: 65.5in Body Surface Area: 2.15 m Body Mass Index: 39.51 kg/m  Temp.: 98.49F(Oral)  Pulse: 77 (Regular)  BP: 142/80 (Sitting, Left Arm, Standard)   Physical Exam  General: WN AA F alert and generally healthy appearing. HEENT: Normal. Pupils equal.  Neck: Supple. No mass. No thyroid mass. Lymph Nodes: No supraclavicular or cervical nodes.  Her Chandler axilla is "empty" feeling - so I suspect that I did a complete node dissection last time  Lungs: Clear to auscultation and symmetric breath sounds. Heart: RRR. No murmur or rub.  Breast: Chandler - smallish Chandler breast - a least 1/2 the size of the left breast  Left - No mass or nodule in the left breast. Somewhat large breast.  Abdomen: Soft. No mass. No tenderness. No hernia. Normal bowel sounds. Rectal: Not done.  Extremities: Good strength and ROM in upper and lower extremities.  Neurologic: Grossly intact to motor and sensory function. Psychiatric: Has normal mood and affect. Behavior is normal.   Assessment & Plan  1.  MALIGNANT NEOPLASM OF Chandler BREAST, STAGE 1, ESTROGEN RECEPTOR POSITIVE (C50.911)  Story: Chandler breast biopsy on 08/23/2016. The biopsy ([id number]) showed IDC, ER - 100%, PR - 30%, Her2Neu - positive.  Plan:   1) Plastic surgery consult    Saw Dr. Iran Planas on 09/15/2016.    She has discussed Chandler tissue expander and lat flap.   2)     Genetics came  back negative.  I called the patient and left a message on the AM.   3) Medical oncology consult - I    Dr. Benay Spice saw her on 09/21/2016   4) I started her on Tamoxifen while she is waiting.  2. TAHBSO - 11/13/2006 - M Grewal 3. Both hips replaced Alvan Dame  Oct and Nov 2016. 4. Neck surgery  Rolena Infante - April 2017 5.  HTN   Alphonsa Overall, MD, [facility] Surgery Pager: [phone] Office phone:  [phone]

## 2016-10-28 ENCOUNTER — Observation Stay (HOSPITAL_COMMUNITY)
Admission: RE | Admit: 2016-10-28 | Discharge: 2016-10-30 | Disposition: A | Discharge status: Home / Self Care | Payer: 59 | Source: Ambulatory Visit | Attending: Plastic Surgery | Admitting: Plastic Surgery

## 2016-10-28 ENCOUNTER — Ambulatory Visit (HOSPITAL_COMMUNITY): Payer: 59 | Admitting: Certified Registered Nurse Anesthetist

## 2016-10-28 ENCOUNTER — Encounter (HOSPITAL_COMMUNITY)
Admission: RE | Disposition: A | Discharge status: Home / Self Care | Payer: Self-pay | Source: Ambulatory Visit | Attending: Plastic Surgery

## 2016-10-28 ENCOUNTER — Encounter (HOSPITAL_COMMUNITY): Payer: Self-pay | Admitting: *Deleted

## 2016-10-28 DIAGNOSIS — Z90710 Acquired absence of both cervix and uterus: Secondary | ICD-10-CM | POA: Insufficient documentation

## 2016-10-28 DIAGNOSIS — D0511 Intraductal carcinoma in situ of right breast: Secondary | ICD-10-CM | POA: Diagnosis not present

## 2016-10-28 DIAGNOSIS — C50911 Malignant neoplasm of unspecified site of right female breast: Secondary | ICD-10-CM | POA: Diagnosis present

## 2016-10-28 DIAGNOSIS — Z923 Personal history of irradiation: Secondary | ICD-10-CM | POA: Diagnosis not present

## 2016-10-28 DIAGNOSIS — R339 Retention of urine, unspecified: Secondary | ICD-10-CM | POA: Diagnosis not present

## 2016-10-28 DIAGNOSIS — I10 Essential (primary) hypertension: Secondary | ICD-10-CM | POA: Insufficient documentation

## 2016-10-28 DIAGNOSIS — Z853 Personal history of malignant neoplasm of breast: Secondary | ICD-10-CM | POA: Diagnosis not present

## 2016-10-28 DIAGNOSIS — F419 Anxiety disorder, unspecified: Secondary | ICD-10-CM | POA: Insufficient documentation

## 2016-10-28 DIAGNOSIS — Z170 Estrogen receptor positive status [ER+]: Secondary | ICD-10-CM | POA: Diagnosis not present

## 2016-10-28 DIAGNOSIS — Z6839 Body mass index (BMI) 39.0-39.9, adult: Secondary | ICD-10-CM | POA: Diagnosis not present

## 2016-10-28 DIAGNOSIS — M1990 Unspecified osteoarthritis, unspecified site: Secondary | ICD-10-CM | POA: Diagnosis not present

## 2016-10-28 DIAGNOSIS — Z8249 Family history of ischemic heart disease and other diseases of the circulatory system: Secondary | ICD-10-CM | POA: Diagnosis not present

## 2016-10-28 DIAGNOSIS — Z96643 Presence of artificial hip joint, bilateral: Secondary | ICD-10-CM | POA: Insufficient documentation

## 2016-10-28 DIAGNOSIS — K219 Gastro-esophageal reflux disease without esophagitis: Secondary | ICD-10-CM | POA: Insufficient documentation

## 2016-10-28 HISTORY — DX: Malignant neoplasm of unspecified site of right female breast: C50.911

## 2016-10-28 HISTORY — DX: Migraine, unspecified, not intractable, without status migrainosus: G43.909

## 2016-10-28 HISTORY — PX: LATISSIMUS FLAP TO BREAST: SHX5357

## 2016-10-28 HISTORY — PX: TISSUE EXPANDER PLACEMENT: SHX2530

## 2016-10-28 HISTORY — PX: MASTECTOMY: SHX3

## 2016-10-28 HISTORY — DX: Prediabetes: R73.03

## 2016-10-28 HISTORY — PX: RECONSTRUCTION BREAST W/ LATISSIMUS DORSI FLAP: SUR1078

## 2016-10-28 HISTORY — PX: TOTAL MASTECTOMY: SHX6129

## 2016-10-28 SURGERY — MASTECTOMY, SIMPLE
Anesthesia: General | Site: Breast | Laterality: Right

## 2016-10-28 MED ORDER — CARVEDILOL 25 MG PO TABS
25.0000 mg | ORAL_TABLET | Freq: Two times a day (BID) | ORAL | Status: DC
  Administered 2016-10-28 – 2016-10-30 (×3): 25 mg via ORAL
  Filled 2016-10-28 (×3): qty 1

## 2016-10-28 MED ORDER — METHOCARBAMOL 500 MG PO TABS
ORAL_TABLET | ORAL | Status: AC
  Filled 2016-10-28: qty 1

## 2016-10-28 MED ORDER — KETOROLAC TROMETHAMINE 30 MG/ML IJ SOLN
30.0000 mg | Freq: Three times a day (TID) | INTRAMUSCULAR | Status: AC
  Administered 2016-10-28 – 2016-10-29 (×3): 30 mg via INTRAVENOUS
  Filled 2016-10-28 (×2): qty 1

## 2016-10-28 MED ORDER — ONDANSETRON HCL 4 MG/2ML IJ SOLN
INTRAMUSCULAR | Status: DC | PRN
  Administered 2016-10-28: 4 mg via INTRAVENOUS

## 2016-10-28 MED ORDER — CHLORHEXIDINE GLUCONATE CLOTH 2 % EX PADS: 6.0000 | MEDICATED_PAD | Freq: Once | CUTANEOUS | Status: DC

## 2016-10-28 MED ORDER — PHENYLEPHRINE HCL 10 MG/ML IJ SOLN
INTRAVENOUS | Status: DC | PRN
  Administered 2016-10-28: 50 ug/min via INTRAVENOUS

## 2016-10-28 MED ORDER — CEFAZOLIN SODIUM-DEXTROSE 2-4 GM/100ML-% IV SOLN
2.0000 g | INTRAVENOUS | Status: AC
  Administered 2016-10-28: 2 g via INTRAVENOUS
  Filled 2016-10-28: qty 100

## 2016-10-28 MED ORDER — SODIUM CHLORIDE 0.9 % IV SOLN
INTRAVENOUS | Status: DC | PRN
  Administered 2016-10-28: 40 mL

## 2016-10-28 MED ORDER — ACETAMINOPHEN 500 MG PO TABS
1000.0000 mg | ORAL_TABLET | ORAL | Status: AC
  Administered 2016-10-28: 1000 mg via ORAL
  Filled 2016-10-28: qty 2

## 2016-10-28 MED ORDER — FENTANYL CITRATE (PF) 100 MCG/2ML IJ SOLN
INTRAMUSCULAR | Status: AC
  Filled 2016-10-28: qty 4

## 2016-10-28 MED ORDER — SODIUM CHLORIDE 0.9 % IV SOLN
Freq: Once | INTRAVENOUS | Status: DC
  Filled 2016-10-28: qty 1

## 2016-10-28 MED ORDER — DEXAMETHASONE SODIUM PHOSPHATE 10 MG/ML IJ SOLN
INTRAMUSCULAR | Status: AC
  Filled 2016-10-28: qty 1

## 2016-10-28 MED ORDER — LIDOCAINE HCL (CARDIAC) 20 MG/ML IV SOLN
INTRAVENOUS | Status: DC | PRN
  Administered 2016-10-28: 60 mg via INTRAVENOUS

## 2016-10-28 MED ORDER — HYDROMORPHONE HCL 1 MG/ML IJ SOLN
0.5000 mg | INTRAMUSCULAR | Status: DC | PRN
  Administered 2016-10-30: 1 mg via INTRAVENOUS
  Filled 2016-10-28: qty 1

## 2016-10-28 MED ORDER — MIDAZOLAM HCL 2 MG/2ML IJ SOLN
INTRAMUSCULAR | Status: AC
  Filled 2016-10-28: qty 2

## 2016-10-28 MED ORDER — PROPOFOL 10 MG/ML IV BOLUS
INTRAVENOUS | Status: AC
  Filled 2016-10-28: qty 20

## 2016-10-28 MED ORDER — HYDROMORPHONE HCL 1 MG/ML IJ SOLN
INTRAMUSCULAR | Status: AC
  Administered 2016-10-28: 0.5 mg via INTRAVENOUS
  Filled 2016-10-28: qty 1

## 2016-10-28 MED ORDER — LIDOCAINE 2% (20 MG/ML) 5 ML SYRINGE
INTRAMUSCULAR | Status: AC
  Filled 2016-10-28: qty 5

## 2016-10-28 MED ORDER — BUPIVACAINE-EPINEPHRINE (PF) 0.5% -1:200000 IJ SOLN
INTRAMUSCULAR | Status: DC | PRN
  Administered 2016-10-28: 30 mL

## 2016-10-28 MED ORDER — METHOCARBAMOL 500 MG PO TABS
500.0000 mg | ORAL_TABLET | Freq: Three times a day (TID) | ORAL | Status: DC | PRN
  Administered 2016-10-28 – 2016-10-29 (×3): 500 mg via ORAL
  Filled 2016-10-28 (×2): qty 1

## 2016-10-28 MED ORDER — ONDANSETRON HCL 4 MG/2ML IJ SOLN: 4.0000 mg | Freq: Four times a day (QID) | INTRAMUSCULAR | Status: DC | PRN

## 2016-10-28 MED ORDER — BUPIVACAINE LIPOSOME 1.3 % IJ SUSP
20.0000 mL | Freq: Once | INTRAMUSCULAR | Status: DC
  Filled 2016-10-28: qty 20

## 2016-10-28 MED ORDER — ONDANSETRON 4 MG PO TBDP: 4.0000 mg | ORAL_TABLET | Freq: Four times a day (QID) | ORAL | Status: DC | PRN

## 2016-10-28 MED ORDER — PROPOFOL 10 MG/ML IV BOLUS
INTRAVENOUS | Status: DC | PRN
  Administered 2016-10-28: 130 mg via INTRAVENOUS
  Administered 2016-10-28: 50 mg via INTRAVENOUS

## 2016-10-28 MED ORDER — CITALOPRAM HYDROBROMIDE 20 MG PO TABS
40.0000 mg | ORAL_TABLET | Freq: Every day | ORAL | Status: DC
  Administered 2016-10-28 – 2016-10-30 (×3): 40 mg via ORAL
  Filled 2016-10-28 (×3): qty 2

## 2016-10-28 MED ORDER — LACTATED RINGERS IV SOLN
INTRAVENOUS | Status: DC
  Administered 2016-10-28 (×2): via INTRAVENOUS

## 2016-10-28 MED ORDER — PANTOPRAZOLE SODIUM 40 MG PO TBEC
40.0000 mg | DELAYED_RELEASE_TABLET | Freq: Every evening | ORAL | Status: DC
  Administered 2016-10-28 – 2016-10-29 (×2): 40 mg via ORAL
  Filled 2016-10-28 (×2): qty 1

## 2016-10-28 MED ORDER — FLUTICASONE PROPIONATE 50 MCG/ACT NA SUSP: 2.0000 | Freq: Every day | NASAL | Status: DC | PRN

## 2016-10-28 MED ORDER — FENTANYL CITRATE (PF) 100 MCG/2ML IJ SOLN
INTRAMUSCULAR | Status: AC
  Filled 2016-10-28: qty 2

## 2016-10-28 MED ORDER — MIDAZOLAM HCL 5 MG/5ML IJ SOLN
INTRAMUSCULAR | Status: DC | PRN
  Administered 2016-10-28: 2 mg via INTRAVENOUS

## 2016-10-28 MED ORDER — ENOXAPARIN SODIUM 40 MG/0.4ML ~~LOC~~ SOLN
40.0000 mg | SUBCUTANEOUS | Status: DC
Start: 2016-10-29 — End: 2016-10-30
  Administered 2016-10-29 – 2016-10-30 (×2): 40 mg via SUBCUTANEOUS
  Filled 2016-10-28 (×2): qty 0.4

## 2016-10-28 MED ORDER — KCL IN DEXTROSE-NACL 20-5-0.45 MEQ/L-%-% IV SOLN
INTRAVENOUS | Status: DC
  Administered 2016-10-28: 75 mL/h via INTRAVENOUS
  Administered 2016-10-29 – 2016-10-30 (×2): via INTRAVENOUS
  Filled 2016-10-28 (×4): qty 1000

## 2016-10-28 MED ORDER — HYDROMORPHONE HCL 1 MG/ML IJ SOLN
0.2500 mg | INTRAMUSCULAR | Status: DC | PRN
  Administered 2016-10-28: 0.5 mg via INTRAVENOUS

## 2016-10-28 MED ORDER — INFLUENZA VAC SPLIT QUAD 0.5 ML IM SUSY
0.5000 mL | PREFILLED_SYRINGE | INTRAMUSCULAR | Status: AC
  Administered 2016-10-29: 0.5 mL via INTRAMUSCULAR
  Filled 2016-10-28: qty 0.5

## 2016-10-28 MED ORDER — EPHEDRINE 5 MG/ML INJ
INTRAVENOUS | Status: AC
  Filled 2016-10-28: qty 20

## 2016-10-28 MED ORDER — PHENYLEPHRINE 40 MCG/ML (10ML) SYRINGE FOR IV PUSH (FOR BLOOD PRESSURE SUPPORT)
PREFILLED_SYRINGE | INTRAVENOUS | Status: AC
  Filled 2016-10-28: qty 20

## 2016-10-28 MED ORDER — FENTANYL CITRATE (PF) 100 MCG/2ML IJ SOLN
INTRAMUSCULAR | Status: AC
Start: 2016-10-28 — End: 2016-10-28
  Filled 2016-10-28: qty 2

## 2016-10-28 MED ORDER — SUGAMMADEX SODIUM 200 MG/2ML IV SOLN
INTRAVENOUS | Status: AC
  Filled 2016-10-28: qty 2

## 2016-10-28 MED ORDER — LOSARTAN POTASSIUM 50 MG PO TABS
100.0000 mg | ORAL_TABLET | Freq: Every day | ORAL | Status: DC
  Administered 2016-10-28 – 2016-10-30 (×3): 100 mg via ORAL
  Filled 2016-10-28 (×3): qty 2

## 2016-10-28 MED ORDER — ONDANSETRON HCL 4 MG/2ML IJ SOLN
INTRAMUSCULAR | Status: AC
  Filled 2016-10-28: qty 2

## 2016-10-28 MED ORDER — SODIUM CHLORIDE 0.9 % IV SOLN
INTRAVENOUS | Status: DC | PRN
  Administered 2016-10-28: 1000 mL

## 2016-10-28 MED ORDER — FENTANYL CITRATE (PF) 100 MCG/2ML IJ SOLN
INTRAMUSCULAR | Status: DC | PRN
  Administered 2016-10-28 (×6): 50 ug via INTRAVENOUS

## 2016-10-28 MED ORDER — SUGAMMADEX SODIUM 200 MG/2ML IV SOLN
INTRAVENOUS | Status: DC | PRN
  Administered 2016-10-28: 100 mg via INTRAVENOUS
  Administered 2016-10-28: 200 mg via INTRAVENOUS

## 2016-10-28 MED ORDER — EPHEDRINE SULFATE 50 MG/ML IJ SOLN
INTRAMUSCULAR | Status: DC | PRN
  Administered 2016-10-28: 5 mg via INTRAVENOUS
  Administered 2016-10-28: 10 mg via INTRAVENOUS
  Administered 2016-10-28: 5 mg via INTRAVENOUS

## 2016-10-28 MED ORDER — HYDROCHLOROTHIAZIDE 25 MG PO TABS
25.0000 mg | ORAL_TABLET | Freq: Every day | ORAL | Status: DC
  Administered 2016-10-29 – 2016-10-30 (×2): 25 mg via ORAL
  Filled 2016-10-28 (×2): qty 1

## 2016-10-28 MED ORDER — 0.9 % SODIUM CHLORIDE (POUR BTL) OPTIME
TOPICAL | Status: DC | PRN
  Administered 2016-10-28: 2000 mL

## 2016-10-28 MED ORDER — KETOROLAC TROMETHAMINE 30 MG/ML IJ SOLN
INTRAMUSCULAR | Status: AC
  Administered 2016-10-28: 30 mg via INTRAVENOUS
  Filled 2016-10-28: qty 1

## 2016-10-28 MED ORDER — CEFAZOLIN SODIUM-DEXTROSE 2-4 GM/100ML-% IV SOLN
2.0000 g | Freq: Three times a day (TID) | INTRAVENOUS | Status: DC
  Administered 2016-10-28 – 2016-10-30 (×5): 2 g via INTRAVENOUS
  Filled 2016-10-28 (×6): qty 100

## 2016-10-28 MED ORDER — ROCURONIUM BROMIDE 100 MG/10ML IV SOLN
INTRAVENOUS | Status: DC | PRN
  Administered 2016-10-28 (×2): 50 mg via INTRAVENOUS

## 2016-10-28 MED ORDER — HYDROCODONE-ACETAMINOPHEN 5-325 MG PO TABS
1.0000 | ORAL_TABLET | ORAL | Status: DC | PRN
  Administered 2016-10-28 – 2016-10-30 (×8): 2 via ORAL
  Filled 2016-10-28 (×7): qty 2

## 2016-10-28 MED ORDER — ROCURONIUM BROMIDE 50 MG/5ML IV SOSY
PREFILLED_SYRINGE | INTRAVENOUS | Status: AC
  Filled 2016-10-28: qty 10

## 2016-10-28 MED ORDER — GABAPENTIN 300 MG PO CAPS
300.0000 mg | ORAL_CAPSULE | ORAL | Status: AC
  Administered 2016-10-28: 300 mg via ORAL
  Filled 2016-10-28: qty 1

## 2016-10-28 MED ORDER — HYDROCODONE-ACETAMINOPHEN 5-325 MG PO TABS
ORAL_TABLET | ORAL | Status: AC
  Filled 2016-10-28: qty 2

## 2016-10-28 MED ORDER — DEXAMETHASONE SODIUM PHOSPHATE 10 MG/ML IJ SOLN
INTRAMUSCULAR | Status: DC | PRN
  Administered 2016-10-28: 5 mg via INTRAVENOUS

## 2016-10-28 SURGICAL SUPPLY — 108 items
APPLIER CLIP 9.375 MED OPEN (MISCELLANEOUS) ×3
ATCH SMKEVC FLXB CAUT HNDSWH (FILTER) ×2 IMPLANT
BAG DECANTER FOR FLEXI CONT (MISCELLANEOUS) ×3 IMPLANT
BINDER BREAST LRG (GAUZE/BANDAGES/DRESSINGS) IMPLANT
BINDER BREAST XLRG (GAUZE/BANDAGES/DRESSINGS) IMPLANT
BIOPATCH RED 1 DISK 7.0 (GAUZE/BANDAGES/DRESSINGS) ×6 IMPLANT
BLADE SURG 10 STRL SS (BLADE) ×3 IMPLANT
BLADE SURG 15 STRL LF DISP TIS (BLADE) ×2 IMPLANT
BLADE SURG 15 STRL SS (BLADE) ×1
BNDG COHESIVE 4X5 TAN STRL (GAUZE/BANDAGES/DRESSINGS) ×3 IMPLANT
CANISTER SUCT 3000ML PPV (MISCELLANEOUS) ×3 IMPLANT
CHLORAPREP W/TINT 26ML (MISCELLANEOUS) ×3 IMPLANT
CLIP APPLIE 9.375 MED OPEN (MISCELLANEOUS) ×2 IMPLANT
CONNECTOR 5 IN 1 STRAIGHT STRL (MISCELLANEOUS) ×3 IMPLANT
CONT SPEC 4OZ CLIKSEAL STRL BL (MISCELLANEOUS) ×3 IMPLANT
COVER MAYO STAND STRL (DRAPES) ×6 IMPLANT
COVER SURGICAL LIGHT HANDLE (MISCELLANEOUS) ×6 IMPLANT
DERMABOND ADVANCED (GAUZE/BANDAGES/DRESSINGS) ×2
DERMABOND ADVANCED .7 DNX12 (GAUZE/BANDAGES/DRESSINGS) ×4 IMPLANT
DEVICE DISSECT PLASMABLAD 3.0S (MISCELLANEOUS) IMPLANT
DRAIN CHANNEL 15F RND FF W/TCR (WOUND CARE) ×3 IMPLANT
DRAIN CHANNEL 19F RND (DRAIN) ×3 IMPLANT
DRAPE CHEST BREAST 15X10 FENES (DRAPES) IMPLANT
DRAPE HALF SHEET 40X57 (DRAPES) ×6 IMPLANT
DRAPE INCISE 23X17 IOBAN STRL (DRAPES)
DRAPE INCISE IOBAN 23X17 STRL (DRAPES) IMPLANT
DRAPE INCISE IOBAN 85X60 (DRAPES) IMPLANT
DRAPE ORTHO SPLIT 77X108 STRL (DRAPES) ×6
DRAPE PROXIMA HALF (DRAPES) IMPLANT
DRAPE SURG ORHT 6 SPLT 77X108 (DRAPES) ×12 IMPLANT
DRAPE UTILITY XL STRL (DRAPES) ×3 IMPLANT
DRAPE WARM FLUID 44X44 (DRAPE) ×3 IMPLANT
DRSG MEPILEX BORDER 4X8 (GAUZE/BANDAGES/DRESSINGS) IMPLANT
DRSG PAD ABDOMINAL 8X10 ST (GAUZE/BANDAGES/DRESSINGS) IMPLANT
DRSG TEGADERM 2-3/8X2-3/4 SM (GAUZE/BANDAGES/DRESSINGS) ×6 IMPLANT
DRSG TEGADERM 4X4.75 (GAUZE/BANDAGES/DRESSINGS) IMPLANT
ELECT BLADE 4.0 EZ CLEAN MEGAD (MISCELLANEOUS) ×3
ELECT BLADE 6.5 EXT (BLADE) IMPLANT
ELECT CAUTERY BLADE 6.4 (BLADE) ×3 IMPLANT
ELECT COATED BLADE 2.86 ST (ELECTRODE) ×3 IMPLANT
ELECT REM PT RETURN 9FT ADLT (ELECTROSURGICAL) ×3
ELECTRODE BLDE 4.0 EZ CLN MEGD (MISCELLANEOUS) ×2 IMPLANT
ELECTRODE REM PT RTRN 9FT ADLT (ELECTROSURGICAL) ×2 IMPLANT
EVACUATOR SILICONE 100CC (DRAIN) ×6 IMPLANT
EVACUATOR SMOKE ACCUVAC VALLEY (FILTER) ×1
EXPANDER TISSUE MX 500CC (Prosthesis & Implant Plastic) ×2 IMPLANT
GAUZE SPONGE 4X4 12PLY STRL (GAUZE/BANDAGES/DRESSINGS) IMPLANT
GAUZE XEROFORM 5X9 LF (GAUZE/BANDAGES/DRESSINGS) IMPLANT
GLOVE BIO SURGEON STRL SZ 6 (GLOVE) ×9 IMPLANT
GLOVE BIO SURGEON STRL SZ8 (GLOVE) ×6 IMPLANT
GLOVE BIOGEL PI IND STRL 7.5 (GLOVE) ×2 IMPLANT
GLOVE BIOGEL PI IND STRL 8.5 (GLOVE) ×2 IMPLANT
GLOVE BIOGEL PI INDICATOR 7.5 (GLOVE) ×1
GLOVE BIOGEL PI INDICATOR 8.5 (GLOVE) ×1
GLOVE ECLIPSE 7.5 STRL STRAW (GLOVE) ×3 IMPLANT
GLOVE SURG SIGNA 7.5 PF LTX (GLOVE) ×6 IMPLANT
GLOVE SURG SS PI 6.0 STRL IVOR (GLOVE) IMPLANT
GLOVE SURG SS PI 7.5 STRL IVOR (GLOVE) ×3 IMPLANT
GOWN STRL REUS W/ TWL LRG LVL3 (GOWN DISPOSABLE) ×8 IMPLANT
GOWN STRL REUS W/ TWL XL LVL3 (GOWN DISPOSABLE) ×6 IMPLANT
GOWN STRL REUS W/TWL LRG LVL3 (GOWN DISPOSABLE) ×4
GOWN STRL REUS W/TWL XL LVL3 (GOWN DISPOSABLE) ×3
ILLUMINATOR WAVEGUIDE N/F (MISCELLANEOUS) IMPLANT
KIT BASIN OR (CUSTOM PROCEDURE TRAY) ×3 IMPLANT
KIT ROOM TURNOVER OR (KITS) ×3 IMPLANT
LIGHT WAVEGUIDE WIDE FLAT (MISCELLANEOUS) IMPLANT
MARKER SKIN DUAL TIP RULER LAB (MISCELLANEOUS) ×3 IMPLANT
NEEDLE 22X1 1/2 (OR ONLY) (NEEDLE) IMPLANT
NS IRRIG 1000ML POUR BTL (IV SOLUTION) ×6 IMPLANT
PACK GENERAL/GYN (CUSTOM PROCEDURE TRAY) ×3 IMPLANT
PAD ABD 8X10 STRL (GAUZE/BANDAGES/DRESSINGS) ×6 IMPLANT
PAD ARMBOARD 7.5X6 YLW CONV (MISCELLANEOUS) ×3 IMPLANT
PEN SKIN MARKING BROAD (MISCELLANEOUS) IMPLANT
PENCIL BUTTON HOLSTER BLD 10FT (ELECTRODE) ×3 IMPLANT
PIN SAFETY STERILE (MISCELLANEOUS) ×3 IMPLANT
PLASMABLADE 3.0S (MISCELLANEOUS)
SET COLLECT BLD 21X3/4 12 PB (MISCELLANEOUS) ×3 IMPLANT
SOLUTION BETADINE 4OZ (MISCELLANEOUS) ×3 IMPLANT
SPECIMEN JAR LARGE (MISCELLANEOUS) ×3 IMPLANT
SPECIMEN JAR X LARGE (MISCELLANEOUS) IMPLANT
SPONGE GAUZE 4X4 12PLY STER LF (GAUZE/BANDAGES/DRESSINGS) IMPLANT
SPONGE LAP 18X18 X RAY DECT (DISPOSABLE) ×3 IMPLANT
STAPLER VISISTAT 35W (STAPLE) ×3 IMPLANT
STOCKINETTE IMPERVIOUS 9X36 MD (GAUZE/BANDAGES/DRESSINGS) ×3 IMPLANT
STRIP CLOSURE SKIN 1/2X4 (GAUZE/BANDAGES/DRESSINGS) IMPLANT
SUT CHROMIC 4 0 PS 2 18 (SUTURE) IMPLANT
SUT ETHILON 2 0 FS 18 (SUTURE) ×6 IMPLANT
SUT MNCRL AB 4-0 PS2 18 (SUTURE) ×9 IMPLANT
SUT PDS AB 2-0 CT1 27 (SUTURE) ×18 IMPLANT
SUT SILK 2 0 FS (SUTURE) ×3 IMPLANT
SUT VIC AB 3-0 PS2 18 (SUTURE) ×1
SUT VIC AB 3-0 PS2 18XBRD (SUTURE) ×2 IMPLANT
SUT VIC AB 3-0 SH 18 (SUTURE) IMPLANT
SUT VIC AB 3-0 SH 8-18 (SUTURE) IMPLANT
SUT VIC AB 4-0 PS2 18 (SUTURE) ×3 IMPLANT
SUT VIC AB 4-0 PS2 27 (SUTURE) ×6 IMPLANT
SUT VICRYL 4-0 PS2 18IN ABS (SUTURE) IMPLANT
SUT VLOC 180 0 24IN GS25 (SUTURE) ×6 IMPLANT
SYR 50ML SLIP (SYRINGE) IMPLANT
SYR BULB IRRIGATION 50ML (SYRINGE) ×3 IMPLANT
SYR CONTROL 10ML LL (SYRINGE) ×3 IMPLANT
TISSUE EXPANDER MX 500CC (Prosthesis & Implant Plastic) ×3 IMPLANT
TOWEL OR 17X24 6PK STRL BLUE (TOWEL DISPOSABLE) IMPLANT
TOWEL OR 17X26 10 PK STRL BLUE (TOWEL DISPOSABLE) ×3 IMPLANT
TRAY FOLEY CATH 14FRSI W/METER (CATHETERS) ×3 IMPLANT
TUBE CONNECTING 12X1/4 (SUCTIONS) ×9 IMPLANT
TUBE CONNECTING 20X1/4 (TUBING) ×3 IMPLANT
YANKAUER SUCT BULB TIP NO VENT (SUCTIONS) ×3 IMPLANT

## 2016-10-28 NOTE — Transfer of Care (Signed)
Immediate Anesthesia Transfer of Care Note  Patient: Madeline Chandler  Procedure(s) Performed: Procedure(s) with comments: RIGHT MASTECTOMY (Right) LATISSIMUS FLAP TO BREAST WITH PLACEMENT OF TISSUE EXPANDER ON THE RIGHT (Right) - RFNA TISSUE EXPANDER (Right)  Patient Location: PACU  Anesthesia Type:GA combined with regional for post-op pain  Level of Consciousness: sedated  Airway & Oxygen Therapy: Patient Spontanous Breathing and Patient connected to nasal cannula oxygen  Post-op Assessment: Report given to RN and Post -op Vital signs reviewed and stable  Post vital signs: Reviewed and stable  Last Vitals:  Vitals:   10/28/16 0811 10/28/16 1523  BP: (!) 117/49 (!) 101/55  Pulse: 72   Resp: 20   Temp: 36.9 C 36.3 C    Last Pain:  Vitals:   10/28/16 0831  TempSrc:   PainSc: 0-No pain         Complications: No apparent anesthesia complications

## 2016-10-28 NOTE — Anesthesia Procedure Notes (Signed)
Anesthesia Regional Block: Pectoralis block   Pre-Anesthetic Checklist: ,, timeout performed, Correct Patient, Correct Site, Correct Laterality, Correct Procedure, Correct Position, site marked, Risks and benefits discussed, pre-op evaluation,  At surgeon's request and post-op pain management  Laterality: Right  Prep: Maximum Sterile Barrier Precautions used, chloraprep       Needles:  Injection technique: Single-shot  Needle Type: Echogenic Stimulator Needle     Needle Length: 9cm  Needle Gauge: 21     Additional Needles:   Procedures: ultrasound guided,,,,,,,,  Narrative:  Start time: 10/28/2016 10:32 AM End time: 10/28/2016 10:42 AM Injection made incrementally with aspirations every 5 mL. Anesthesiologist: Roderic Palau  Additional Notes: 2% Lidocaine skin wheel.

## 2016-10-28 NOTE — Anesthesia Preprocedure Evaluation (Addendum)
Anesthesia Evaluation  Patient identified by MRN, date of birth, ID band Patient awake    Reviewed: Allergy & Precautions, H&P , NPO status , Patient's Chart, lab work & pertinent test results, reviewed documented beta blocker date and time   Airway Mallampati: III  TM Distance: >3 FB Neck ROM: Full    Dental no notable dental hx. (+) Teeth Intact, Dental Advisory Given   Pulmonary neg pulmonary ROS,    Pulmonary exam normal breath sounds clear to auscultation       Cardiovascular hypertension, Pt. on medications and Pt. on home beta blockers  Rhythm:Regular Rate:Normal     Neuro/Psych  Headaches,    GI/Hepatic Neg liver ROS, GERD  Medicated and Controlled,  Endo/Other  Morbid obesity  Renal/GU negative Renal ROS  negative genitourinary   Musculoskeletal  (+) Arthritis , Osteoarthritis,    Abdominal   Peds  Hematology negative hematology ROS (+)   Anesthesia Other Findings   Reproductive/Obstetrics negative OB ROS                            Anesthesia Physical Anesthesia Plan  ASA: III  Anesthesia Plan: General   Post-op Pain Management:  Regional for Post-op pain   Induction: Intravenous  Airway Management Planned: Oral ETT  Additional Equipment:   Intra-op Plan:   Post-operative Plan: Extubation in OR  Informed Consent: I have reviewed the patients History and Physical, chart, labs and discussed the procedure including the risks, benefits and alternatives for the proposed anesthesia with the patient or authorized representative who has indicated his/her understanding and acceptance.   Dental advisory given  Plan Discussed with: CRNA  Anesthesia Plan Comments:         Anesthesia Quick Evaluation

## 2016-10-28 NOTE — Interval H&P Note (Signed)
History and Physical Interval Note:  10/28/2016 10:26 AM  Madeline Chandler  has presented today for surgery, with the diagnosis of RIGHT BREAST CANCER  The various methods of treatment have been discussed with the patient and family.  Sister and mother at bedside.  After consideration of risks, benefits and other options for treatment, the patient has consented to  Procedure(s) with comments: RIGHT MASTECTOMY (Right) LATISSIMUS FLAP TO BREAST WITH PLACEMENT OF TISSUE EXPANDER ON THE RIGHT (Right) - RFNA TISSUE EXPANDER (Right) as a surgical intervention .  The patient's history has been reviewed, patient examined, no change in status, stable for surgery.  I have reviewed the patient's chart and labs.  Questions were answered to the patient's satisfaction.     [name]

## 2016-10-28 NOTE — Interval H&P Note (Signed)
History and Physical Interval Note:  10/28/2016 8:21 AM  Madeline Chandler  has presented today for surgery, with the diagnosis of RIGHT BREAST CANCER  The various methods of treatment have been discussed with the patient and family. After consideration of risks, benefits and other options for treatment, the patient has consented to  Procedure(s) with comments: RIGHT MASTECTOMY (Right) LATISSIMUS FLAP TO BREAST WITH PLACEMENT OF TISSUE EXPANDER ON THE RIGHT (Right) - RFNA TISSUE EXPANDER (Right) as a surgical intervention .  The patient's history has been reviewed, patient examined, no change in status, stable for surgery.  I have reviewed the patient's chart and labs.  Questions were answered to the patient's satisfaction.     Nashika Coker

## 2016-10-28 NOTE — Anesthesia Postprocedure Evaluation (Signed)
Anesthesia Post Note  Patient: Madeline Chandler  Procedure(s) Performed: Procedure(s) (LRB): RIGHT MASTECTOMY (Right) LATISSIMUS FLAP TO BREAST WITH PLACEMENT OF TISSUE EXPANDER ON THE RIGHT (Right) TISSUE EXPANDER (Right)  Patient location during evaluation: PACU Anesthesia Type: General Level of consciousness: awake and alert Pain management: pain level controlled Vital Signs Assessment: post-procedure vital signs reviewed and stable Respiratory status: spontaneous breathing, nonlabored ventilation, respiratory function stable and patient connected to nasal cannula oxygen Cardiovascular status: blood pressure returned to baseline and stable Postop Assessment: no signs of nausea or vomiting Anesthetic complications: no       Last Vitals:  Vitals:   10/28/16 0811 10/28/16 1523  BP: (!) 117/49 (!) 101/55  Pulse: 72   Resp: 20   Temp: 36.9 C 36.3 C    Last Pain:  Vitals:   10/28/16 1523  TempSrc:   PainSc: [place] DAVID

## 2016-10-28 NOTE — Op Note (Signed)
10/28/2016  12:00 PM  PATIENT:  Madeline Chandler, 56 y.o., female, MRN: [id number]  PREOP DIAGNOSIS:  RIGHT BREAST CANCER  POSTOP DIAGNOSIS:   Right breast cancer, 2 o'clock position (Tis, N0)  PROCEDURE:   Procedure(s): RIGHT simple MASTECTOMY - D. Hadyn Blanck LATISSIMUS FLAP TO BREAST WITH PLACEMENT OF TISSUE EXPANDER ON THE RIGHT, TISSUE EXPANDER  - B. Thimmappa  SURGEON:   Alphonsa Overall, M.D.  ASSISTANT:   B. Iran Planas, MD  ANESTHESIA:   general  Anesthesiologist: Roderic Palau, MD CRNA: Suzy Bouchard, CRNA; Lance Coon, CRNA  General  ASA:  2  EBL:  <50  ml  BLOOD ADMINISTERED: none  DRAINS: per Dr. Iran Planas  LOCAL MEDICATIONS USED:   Right breast block  SPECIMEN:   Right breast (suture lateral), right axillary tissue  COUNTS CORRECT:  YES  INDICATIONS FOR PROCEDURE:  Madeline Chandler is a 56 y.o. (DOB: [birth year]) AA female whose primary care physician is Shelda Pal, DO and comes for right mastectomy and immediate reconstruction by Dr. Iran Planas.   The indications and risks of the surgery were explained to the patient.  The risks include, but are not limited to, infection, bleeding, and nerve injury.  OPERATIVE NOTE;  The patient was taken to room # 1 at [facility] where she underwent a general anesthesia  supervised by Anesthesiologist: Roderic Palau, MD CRNA: Suzy Bouchard, CRNA; Lance Coon, CRNA. Her right breast and axilla were prepped with ChloraPrep and sterilely draped.    A time-out and the surgical check list was reviewed.    I made an elliptical incision including the areola in the right breast.  I developed skin flaps medially to the lateral edge of the sternum, inferiorly to the investing fascia of the rectus abdominus muscle, laterally to the anterior edge of the latissimus dorsi muscle, and superiorly to about 2 finger breaths below the clavicle.  I excised the old scar in the lower outer quadrant of the breast.  The breast  was reflected off the pectoralis muscle from medial to lateral.  The lateral attachments in the right axilla were divided and the breast removed.  A long suture was placed on the lateral aspect of the breast.   I resected a pad of fat/scar from the right axilla, but there was no obvious abnormality to this other than scar.   At this point, Dr. Iran Planas started her breast reconstruction and will dictate that part of the operation.  Alphonsa Overall, MD, [facility] Surgery Pager: [phone] Office phone:  [phone]

## 2016-10-28 NOTE — Anesthesia Procedure Notes (Signed)
Procedure Name: Intubation Date/Time: 10/28/2016 10:58 AM Performed by: Lance Coon Pre-anesthesia Checklist: Patient identified, Emergency Drugs available, Suction available, Patient being monitored and Timeout performed Patient Re-evaluated:Patient Re-evaluated prior to inductionOxygen Delivery Method: Circle system utilized Preoxygenation: Pre-oxygenation with 100% oxygen Intubation Type: IV induction Ventilation: Mask ventilation without difficulty and Oral airway inserted - appropriate to patient size Laryngoscope Size: Mac and 3 Grade View: Grade II Tube type: Oral Tube size: 7.0 mm Number of attempts: 1 Airway Equipment and Method: Stylet Placement Confirmation: ETT inserted through vocal cords under direct vision,  positive ETCO2 and breath sounds checked- equal and bilateral Secured at: 21 cm Tube secured with: Tape Dental Injury: Teeth and Oropharynx as per pre-operative assessment

## 2016-10-28 NOTE — Op Note (Signed)
Operative Note   DATE OF OPERATION: 3.23.18  LOCATION: [facility]-observation  SURGICAL DIVISION: Plastic Surgery  PREOPERATIVE DIAGNOSES:  1. Right breast DCIS 2. History right breast cancer 3. History therapeutic radiation  POSTOPERATIVE DIAGNOSES:  same  PROCEDURE:  1. Right breast reconstruction with latissimus dorsi flap 2. Right breast reconstruction with tissue expander  SURGEON: Irene Limbo MD MBA  ASSISTANT: Caryl Asp RNFA  ANESTHESIA:  General.   EBL: 250 ml for entire procedure  COMPLICATIONS: None immediate.   INDICATIONS FOR PROCEDURE:  The patient, Madeline Chandler, is a 56 y.o. female born on 11-12-1960, is here for right mastectomy with immediate expander based reconstruction. She has a history prior lumpectomy and radiation for cancer.   FINDINGS: Natrelle 133MX-13-T 500 ml tissue expander placed, initial fill volume 240 ml [id number]  DESCRIPTION OF PROCEDURE:  The patient's operative site was marked with the patient in the preoperative area. The patient was taken to the operating room. SCDs, Foley catheter were placed and IV antibiotics were given. The patient's operative site was prepped and draped in a sterile fashion. A time out was performed and all information was confirmed to be correct. Following completion of mastectomy, Ioban dressing placed over right chest and drapes were removed. Patient was placed in left lateral position and reprepped and draped. Incision made surrounding skin paddle designed over right back. Skin and superficial fascia elevated off surface of latissimus muscle and subcutaneous tunnel dissected to axilla joining anterior breast cavity. Anterior border of latissimus identified and elevated. Muscle divided inferiorly at superior iliac spine. Submuscular dissection completed toward midline back and toward origin. Thoracodorsal nerve was identified and divided. Flap rotated into anterior chest cavity. Back irrigated and  hemostasis ensured. Exparel infiltrated total 266 mg for chest and back. 15 Fr drain placed in back and secured with 2-0 nylon. 2-0 PDS used to placed quilting sutures from elevated skin flaps to chest wall.  Incision closed with 0 V lock suture in superficial fascia and dermis. Skin closure completed with 4-0 monocryl subcuticular and tissue glue applied.   The flap muscle and skin paddle oriented to defect. The muscle was secured to pectoralis and serratus muscle and inferiorly to abdominal wall fascia with interrupted 2-0 PDS. 62 Fr JP drain placed in breast cavity and secured with 2-0 nylon. The expander was prepared and placed beneath latissimus muscle. The remainder of latissimus inset inferiorly to abdominal wall fascia. The skin paddle was inset with interrupted 3-0 and 4-0 vicryl in dermis and 4-0 monocryl subcuticular for skin closure. Tissue adhesive applied. The port was accessed and filled to 240 ml. Dry dressing and breast binder applied.   The patient was allowed to wake from anesthesia, extubated and taken to the recovery room in satisfactory condition.   SPECIMENS: none  DRAINS: 15 Fr JP right back, 19 Fr JP right breast reconstruction  Irene Limbo, MD [facility] Plastic & Reconstructive Surgery [phone], pin [phone]

## 2016-10-29 DIAGNOSIS — D0511 Intraductal carcinoma in situ of right breast: Secondary | ICD-10-CM | POA: Diagnosis not present

## 2016-10-29 MED ORDER — BETHANECHOL CHLORIDE 25 MG PO TABS
25.0000 mg | ORAL_TABLET | Freq: Three times a day (TID) | ORAL | Status: DC
  Administered 2016-10-29 – 2016-10-30 (×3): 25 mg via ORAL
  Filled 2016-10-29 (×3): qty 1

## 2016-10-29 NOTE — Progress Notes (Signed)
Patient had not voided, and had felt the urge to but was unable to, we bladder scanned her and it was 650 ml, PA Focht notified, she put in order to in and out cath. I also notified her this was the second time we had to do that and asked how many times before placing an indwelling she said 3. We performed in and out and got out 500 ml. Will continue to monitor.

## 2016-10-29 NOTE — Progress Notes (Signed)
Pt unable to urinate on her own. Tried to urinate multiple times. Bladder scan showed 301 ml. Made Dr. Donne Hazel aware. In and out cath order obtained. Able to get 600 ml of urine out.

## 2016-10-29 NOTE — Progress Notes (Signed)
  POD#1 right mastectomy, latissimus flap with TE placement  Urinary retention overnight, required in and out cath Wants to try to void presently Tolerating diet, pain ok  Temp:  [97.3 F (36.3 C)-98.7 F (37.1 C)] 98.7 F (37.1 C) (03/24 0838) Pulse Rate:  [72-81] 81 (03/24 0838) Resp:  [9-18] 18 (03/24 0838) BP: (91-122)/(42-76) 91/42 (03/24 0838) SpO2:  [91 %-99 %] 97 % (03/24 0838)   JP breast 165 back 130 PO 600  PE  Incisions dry intact, flap viable Area of fullness superiorly patient was told to ask about is her drain  Back flat  A/P Ambulate, IS Drain care teaching Try oral pain medication, patient has Rx for Norco filled at home Anticipate home tomorrow  Irene Limbo, MD [facility] Plastic & Reconstructive Surgery [phone], pin [phone]

## 2016-10-29 NOTE — Progress Notes (Signed)
[facility] Progress Note  1 Day Post-Op  Subjective: Unable to urinate. Pain moderately controlled. No other complaints. No abdominal pain  Objective: Vital signs in last 24 hours: Temp:  [97.3 F (36.3 C)-98.7 F (37.1 C)] 98.7 F (37.1 C) (03/24 0838) Pulse Rate:  [72-81] 81 (03/24 0838) Resp:  [9-18] 18 (03/24 0838) BP: (91-122)/(42-76) 91/42 (03/24 0838) SpO2:  [91 %-99 %] 97 % (03/24 0838) Last BM Date: 10/28/16  Intake/Output from previous day: 03/23 0701 - 03/24 0700 In: 3138.8 [P.O.:600; I.V.:2438.8; IV Piggyback:100] Out: 6503 [Urine:1150; Drains:295; Blood:250] Intake/Output this shift: Total I/O In: 120 [P.O.:120] Out: 40 [Drains:40]  PE: Gen:  Alert, NAD, pleasant, cooperative, well appearing Chest: binder in place, TTP, drains with minimal sanguinous drainage Pulm:  Rate and effort normal Abd: Soft, not distended, not tender  Skin: no rashes noted, warm and dry  Lab Results:  No results for input(s): WBC, HGB, HCT, PLT in the last 72 hours. BMET No results for input(s): NA, K, CL, CO2, GLUCOSE, BUN, CREATININE, CALCIUM in the last 72 hours. PT/INR No results for input(s): LABPROT, INR in the last 72 hours. CMP     Component Value Date/Time   NA 138 10/21/2016 1330   K 3.8 10/21/2016 1330   CL 103 10/21/2016 1330   CO2 28 10/21/2016 1330   GLUCOSE 86 10/21/2016 1330   BUN 7 10/21/2016 1330   CREATININE 0.85 10/21/2016 1330   CREATININE 0.92 12/12/2013 1528   CALCIUM 9.4 10/21/2016 1330   PROT 7.7 02/27/2015 0952   ALBUMIN 4.0 02/27/2015 0952   AST 14 02/27/2015 0952   ALT 13 02/27/2015 0952   ALKPHOS 105 02/27/2015 0952   BILITOT 0.4 02/27/2015 0952   GFRNONAA >60 10/21/2016 1330   GFRNONAA 71 12/12/2013 1528   GFRAA >60 10/21/2016 1330   GFRAA 82 12/12/2013 1528   Lipase  No results found for: LIPASE     Studies/Results: No results found.  Anti-infectives: Anti-infectives    Start     Dose/Rate Route Frequency  Ordered Stop   10/28/16 1830  ceFAZolin (ANCEF) IVPB 2g/100 mL premix     2 g 200 mL/hr over 30 Minutes Intravenous Every 8 hours 10/28/16 1737 10/30/16 1829   10/28/16 1026  bacitracin 50,000 Units, gentamicin (GARAMYCIN) 80 mg, ceFAZolin (ANCEF) 1 g in sodium chloride 0.9 % 1,000 mL  Status:  Discontinued       As needed 10/28/16 1036 10/28/16 1513   10/28/16 1015  bacitracin 50,000 Units, gentamicin (GARAMYCIN) 80 mg, ceFAZolin (ANCEF) 1 g in sodium chloride 0.9 % 1,000 mL  Status:  Discontinued      Irrigation Once 10/28/16 1010 10/28/16 1723   10/28/16 0613  ceFAZolin (ANCEF) IVPB 2g/100 mL premix     2 g 200 mL/hr over 30 Minutes Intravenous On call to O.R. 10/28/16 5465 10/28/16 1107       Assessment/Plan  Right breast cancer S/P RIGHT simple MASTECTOMY - D. Newman LATISSIMUS FLAP TO BREAST WITH PLACEMENT OF TISSUE EXPANDER ON THE RIGHT, TISSUE EXPANDER  - B. Thimmappa  - urinary retention requiring in and out cath x 2, if needed a third time would recommend a foley - pain controlled - no abdominal pain  Possible discharge tomorrow   LOS: 0 days    Kalman Drape , [facility] Surgery 10/29/2016, 11:40 AM Pager: [phone] Consults: [phone] Mon-Fri 7:00 am-4:30 pm Sat-Sun 7:00 am-11:30 am

## 2016-10-29 NOTE — Progress Notes (Signed)
While doing patient assessment, I noticed around the right mastectomy site there was some firm spots but the incision site was clean, dry and intact and I told the patient to make sure the MD felt those when they came to do assessment. Patient understood, will continue to monitor.

## 2016-10-30 ENCOUNTER — Encounter (HOSPITAL_COMMUNITY): Payer: Self-pay | Admitting: Surgery

## 2016-10-30 DIAGNOSIS — D0511 Intraductal carcinoma in situ of right breast: Secondary | ICD-10-CM | POA: Diagnosis not present

## 2016-10-30 NOTE — Discharge Summary (Signed)
Physician Discharge Summary  Patient ID: Madeline Chandler MRN: [id number] DOB/AGE: 1960-09-15 56 y.o.  Admit date: 10/28/2016 Discharge date: 10/30/2016  Admission Diagnoses: Right breast DCIS  Discharge Diagnoses:  Active Problems:   Breast cancer, right [facility])  Discharged Condition: stable  Hospital Course: Postoperatively patient had urinary retention and required straight catheterization. Able to void spontaneous POD#1. Able to ambulate with minimal assist. Tolerating oral pain medication and diet.  Treatments: surgery: right mastectomy, latissimus flap with tissue expander placement  Discharge Exam: Blood pressure (!) 85/50, pulse 65, temperature 99 F (37.2 C), temperature source Oral, resp. rate 18, height 5' 5.5" (1.664 m), weight 110.7 kg (244 lb), SpO2 93 %. Incision/Wound: back flat, drains serosanguinous, chest with viable flap, some ecchymoses medial inner mastectomy flap, no drainage, chest flat  Disposition: 01-Home or Self Care  Discharge Instructions    Call MD for:  redness, tenderness, or signs of infection (pain, swelling, bleeding, redness, odor or green/yellow discharge around incision site)    Complete by:  As directed    Call MD for:  temperature >100.5    Complete by:  As directed    Discharge instructions    Complete by:  As directed    Ok to remove dressings and shower am 3.25.18. Soap and water ok, pat incisions dry. No creams or ointments over incisions. Do not let drains dangle in shower, attach to lanyard or similar.Strip and record drains twice daily and bring log to clinic visit.  Breast binder or soft compression bra all other times.  Patient received all prescriptions prior to surgery.  Ok to raise arms above shoulders for bathing and dressing.  No house yard work or exercise until cleared by MD.   Driving Restrictions    Complete by:  As directed    No driving for 2 weeks and if taking narcotics   Lifting restrictions    Complete by:  As  directed    No lifting greater than 5 lbs until cleared by MD   Resume previous diet    Complete by:  As directed      Allergies as of 10/30/2016   No Known Allergies     Medication List    STOP taking these medications   benzonatate 100 MG capsule Commonly known as:  TESSALON   HYDROcodone-homatropine 5-1.5 MG/5ML syrup Commonly known as:  HYCODAN     TAKE these medications   carvedilol 25 MG tablet Commonly known as:  COREG TAKE ONE TABLET BY MOUTH TWICE DAILY WITH MEALS   citalopram 40 MG tablet Commonly known as:  CELEXA TAKE ONE TABLET BY MOUTH ONCE DAILY What changed:  See the new instructions.   fluticasone 50 MCG/ACT nasal spray Commonly known as:  FLONASE Place 2 sprays into both nostrils daily as needed for allergies.   hydrochlorothiazide 25 MG tablet Commonly known as:  HYDRODIURIL TAKE ONE TABLET BY MOUTH ONCE DAILY What changed:  See the new instructions.   losartan 100 MG tablet Commonly known as:  COZAAR TAKE ONE TABLET BY MOUTH ONCE DAILY What changed:  See the new instructions.   pantoprazole 40 MG tablet Commonly known as:  PROTONIX Take 40 mg by mouth every evening.   VITAMIN C PO Take 180 mg by mouth 2 (two) times daily.      Follow-up Information    Jeziah Kretschmer, MD Follow up in 1 week(s).   Specialty:  Plastic Surgery Why:  as scheduled Contact information: [address]  [place] [address] [phone]        NEWMAN,DAVID H, MD. Schedule an appointment as soon as possible for a visit in 2 week(s).   Specialty:  General Surgery Contact information: [address] [place] [address] [phone]           Signed: Irene Limbo 10/30/2016, 8:20 AM

## 2016-10-30 NOTE — Progress Notes (Addendum)
D/C papers gone over with pt. Pt states she already got prescriptions from Dr. Iran Planas. IV taken out. Reinforced JP DRAIN teaching. Pt. States she understands how to strip/empty and has no questions/complaints. JP drain sheet given to pt. And told pt. To take to follow-up appointment. Extra abd pads provided for pt. Pt. D/c'd successfully via w/c.

## 2016-10-30 NOTE — Progress Notes (Signed)
Patient ID: Madeline Chandler, female   DOB: 01-30-1961, 56 y.o.   MRN: [id number] Urinary issues resolved, path pending, home today, follow up Dr Lucia Gaskins

## 2016-11-11 ENCOUNTER — Other Ambulatory Visit: Payer: Self-pay | Admitting: *Deleted

## 2016-11-11 ENCOUNTER — Ambulatory Visit (HOSPITAL_BASED_OUTPATIENT_CLINIC_OR_DEPARTMENT_OTHER): Payer: 59 | Admitting: Oncology

## 2016-11-11 VITALS — BP 149/57 | HR 88 | Temp 98.1°F | Resp 18 | Ht 65.5 in | Wt 240.6 lb

## 2016-11-11 DIAGNOSIS — Z170 Estrogen receptor positive status [ER+]: Secondary | ICD-10-CM

## 2016-11-11 DIAGNOSIS — C50211 Malignant neoplasm of upper-inner quadrant of right female breast: Secondary | ICD-10-CM

## 2016-11-11 DIAGNOSIS — C50911 Malignant neoplasm of unspecified site of right female breast: Secondary | ICD-10-CM

## 2016-11-11 DIAGNOSIS — Z853 Personal history of malignant neoplasm of breast: Secondary | ICD-10-CM

## 2016-11-11 MED ORDER — LETROZOLE 2.5 MG PO TABS: 2.5000 mg | ORAL_TABLET | Freq: Every day | ORAL | 2 refills | Status: DC

## 2016-11-11 MED ORDER — PANTOPRAZOLE SODIUM 40 MG PO TBEC: 40.0000 mg | DELAYED_RELEASE_TABLET | Freq: Every evening | ORAL | 2 refills | Status: DC

## 2016-11-11 NOTE — Progress Notes (Signed)
  [facility] OFFICE PROGRESS NOTE   Diagnosis: Breast cancer  INTERVAL HISTORY:   Madeline Chandler returns as scheduled. She underwent a right mastectomy, latissimus flap with tissue expander placement, and section of right axillary fat on 10/28/2016. She is recovered from surgery. She is followed closely by Dr. Iran Planas for management of the right chest wall wound. A drain remains in place. 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 No residual invasive carcinoma identified. The resection margins were negative. No lymph nodes identified in the axillary contents.  Objective:  Vital signs in last 24 hours:  Blood pressure (!) 149/57, pulse 88, temperature 98.1 F (36.7 C), temperature source Oral, resp. rate 18, height 5' 5.5" (1.664 m), weight 240 lb 9.6 oz (109.1 kg), SpO2 98 %.   HEENT: Neck without mass Lymphatics: No cervical or supraclavicular nodes Resp: Lungs with decreased breath sounds at the left greater than right base, no respiratory distress Cardio: Regular rate and rhythm GI: No hepatosplenomegaly Vascular: No leg edema Breasts: Status post right mastectomy with a latissimus flap reconstruction. Drain with serous fluid remains at the right chest wall.  Skin: Superficial breakdown at the right breast reconstruction, the latissimus incision has healed    Medications: I have reviewed the patient's current medications.  Assessment/Plan: 1. Stage II (T2 N0) sees right-sided breast cancer diagnosed in September 1997, status post a lumpectomy, sentinel lymph node biopsy, and right axillary dissection and radiation followed by adjuvant chemotherapy. She completed 5 years of tamoxifen.  2.  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 Hysterectomy and bilateral oophorectomy April 2008   Disposition:  Madeline Chandler is recovering from the right mastectomy and breast reconstruction procedure. No residual invasive carcinoma was identified. She has a good prognosis for a long-term disease-free survival. We discussed the risk/benefit of adjuvant hormonal therapy. There is a potential small absolute benefit with regard to recurrence of the new invasive right-sided breast cancer and development of contralateral breast cancer. She is post menopausal.  She agrees to a course of adjuvant aromatase inhibitor therapy. She will begin Femara. She will take calcium and vitamin D while on Femara. We discussed the dental toxicities associated with aromatase inhibitor therapy and she agrees to proceed. She will return for an office visit in 3 months. I will discuss the case with my colleagues regarding the HER-2 positivity and potential indication for HER-2 directed therapy.  Betsy Coder, MD  11/11/2016  4:39 PM

## 2016-11-18 ENCOUNTER — Telehealth: Payer: Self-pay | Admitting: Oncology

## 2016-11-18 NOTE — Telephone Encounter (Signed)
Lvm advising appt 7/12/ Also mailed calendar.

## 2016-12-09 ENCOUNTER — Other Ambulatory Visit: Payer: Self-pay | Admitting: Physician Assistant

## 2016-12-09 NOTE — Telephone Encounter (Signed)
Refilled. Needs appt for CPE vs med check/review. TY.

## 2016-12-29 ENCOUNTER — Ambulatory Visit: Payer: 59 | Attending: Plastic Surgery | Admitting: Physical Therapy

## 2016-12-29 DIAGNOSIS — M25611 Stiffness of right shoulder, not elsewhere classified: Secondary | ICD-10-CM | POA: Diagnosis present

## 2016-12-29 DIAGNOSIS — R293 Abnormal posture: Secondary | ICD-10-CM

## 2016-12-29 DIAGNOSIS — Z483 Aftercare following surgery for neoplasm: Secondary | ICD-10-CM | POA: Diagnosis present

## 2016-12-29 NOTE — Patient Instructions (Signed)

## 2016-12-29 NOTE — Therapy (Signed)
[place], [place], [address] Phone: [phone]   Fax:  [phone]  Physical Therapy Evaluation  Patient Details  Name: Madeline Chandler MRN: [id number] Date of Birth: [birth year] Referring Provider: Dr. Irene Limbo  Encounter Date: 12/29/2016      PT End of Session - 12/29/16 0926    Visit Number 1   Number of Visits 12   Date for PT Re-Evaluation 01/26/17   PT Start Time 0847   PT Stop Time 0930   PT Time Calculation (min) 43 min   Activity Tolerance Patient tolerated treatment well   Behavior During Therapy [facility] for tasks assessed/performed      Past Medical History:  Diagnosis Date  . Anxiety   . Arthritis    "hips" (10/28/2016)  . Breast cancer, right breast ([place]) 1997; 2018   S/P chemo & radiation;   . Bulging lumbar disc    2  . GERD (gastroesophageal reflux disease)   . History of colonic polyps    Dr Collene Mares  . Hypertension   . Migraine    "none in the last few years; had them mostly in late 20's/mid 30's" (10/28/2016)  . Pre-diabetes   . Restless leg syndrome     Past Surgical History:  Procedure Laterality Date  . ABDOMINAL HYSTERECTOMY  2000s  . ANTERIOR CERVICAL DECOMP/DISCECTOMY FUSION N/A 12/02/2015   Procedure: ANTERIOR CERVICAL DECOMPRESSION/DISCECTOMY FUSION C4 - C7  3 LEVELS;  Surgeon: Melina Schools, MD;  Location: [facility];  Service: Orthopedics;  Laterality: N/A;  . BACK SURGERY    . BREAST BIOPSY Right 1997; 08/2016  . BREAST LUMPECTOMY Right 1997  . COLONOSCOPY W/ POLYPECTOMY  2010   Dr Collene Mares  . DILATION AND CURETTAGE OF UTERUS  1990s  . JOINT REPLACEMENT    . LATISSIMUS FLAP TO BREAST Right 10/28/2016   Procedure: LATISSIMUS FLAP TO BREAST WITH PLACEMENT OF TISSUE EXPANDER ON THE RIGHT;  Surgeon: Irene Limbo, MD;  Location: [facility];  Service: Plastics;  Laterality: Right;  RFNA  . MASTECTOMY Right 10/28/2016  . RECONSTRUCTION BREAST W/ LATISSIMUS DORSI FLAP  Right 10/28/2016    WITH PLACEMENT OF TISSUE EXPANDER   . TISSUE EXPANDER PLACEMENT Right 10/28/2016   Procedure: TISSUE EXPANDER;  Surgeon: Irene Limbo, MD;  Location: [facility];  Service: Plastics;  Laterality: Right;  . TOTAL HIP ARTHROPLASTY Right 05/12/2015   Procedure: RIGHT TOTAL HIP ARTHROPLASTY ANTERIOR APPROACH;  Surgeon: Paralee Cancel, MD;  Location: [facility];  Service: Orthopedics;  Laterality: Right;  . TOTAL HIP ARTHROPLASTY Left 06/23/2015   Procedure: LEFT TOTAL HIP ARTHROPLASTY ANTERIOR APPROACH;  Surgeon: Paralee Cancel, MD;  Location: [facility];  Service: Orthopedics;  Laterality: Left;  . TOTAL MASTECTOMY Right 10/28/2016   Procedure: RIGHT MASTECTOMY;  Surgeon: Alphonsa Overall, MD;  Location: [facility];  Service: General;  Laterality: Right;    There were no vitals filed for this visit.       Subjective Assessment - 12/29/16 0857    Subjective Patient reports she is having tightness reaching overhead since her right mastectomy for DCIS on 10/28/16. She possibly had a sentinel node biopsy but had a right lumpectomy and axillary node dissection (>20 nodes removed) 20 years ago followed by chemo and radiation. This time, she had a lat flap and expander placed following her right mastectomy.    Pertinent History Right lumpectomy and axillary node dissection 1998 (approximate date) followed by chemo and radiation. Right mastectomy with reconstruction 10/28/16. No  chemo or radiation needed. Bilateral hips replaced 2016.  Cervical fusion (Dr. Rolena Infante) 4/17.   Patient Stated Goals Wash back and reach overhead easier   Currently in Pain? No/denies            [facility] PT Assessment - 12/29/16 0001      Assessment   Medical Diagnosis s/p right mastectomy for DCIS with reconstruction   Referring Provider Dr. Irene Limbo   Onset Date/Surgical Date 10/28/16   Hand Dominance Right   Prior Therapy none     Precautions   Precautions Other (comment)   Precaution Comments Recent breast  reconstruction; right arm lymphedema risk     Restrictions   Weight Bearing Restrictions No     Balance Screen   Has the patient fallen in the past 6 months No   Has the patient had a decrease in activity level because of a fear of falling?  No   Is the patient reluctant to leave their home because of a fear of falling?  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   Leisure She does not exercise     Cognition   Overall Cognitive Status Within Functional Limits for tasks assessed     Posture/Postural Control   Posture/Postural Control Postural limitations   Postural Limitations Forward head;Rounded Shoulders     ROM / Strength   AROM / PROM / Strength AROM;Strength     AROM   AROM Assessment Site Shoulder   Right/Left Shoulder Right;Left   Right Shoulder Extension 43 Degrees   Right Shoulder Flexion 127 Degrees   Right Shoulder ABduction 126 Degrees   Right Shoulder Internal Rotation 69 Degrees   Right Shoulder External Rotation 73 Degrees   Left Shoulder Extension 47 Degrees   Left Shoulder Flexion 155 Degrees   Left Shoulder ABduction 158 Degrees   Left Shoulder Internal Rotation 61 Degrees   Left Shoulder External Rotation 90 Degrees     Strength   Overall Strength Within functional limits for tasks performed   Overall Strength Comments BUE and shoulders 5/5     Palpation   Palpation comment Wound present right breast in central portion with what appears to be healthy tissue healing; approximate width 3/4" and height 1"           LYMPHEDEMA/ONCOLOGY QUESTIONNAIRE - 12/29/16 0914      Type   Cancer Type Right breast DCIS     Surgeries   Mastectomy Date 10/28/16   Other Surgery Date 08/08/96  Right lumpectomy  and ALND   Number Lymph Nodes Removed 20  In 1998     Treatment   Active Chemotherapy Treatment No   Past Chemotherapy Treatment Yes   Date 08/08/96   Active Radiation Treatment No   Past Radiation Treatment Yes   Date 08/08/96   Body Site right breast / axilla   Current Hormone Treatment Yes   Drug Name femara     What other symptoms do you have   Are you Having Heaviness or Tightness No   Are you having Pain No   Are you having pitting edema No   Is it Hard or Difficult finding clothes that fit No   Do you have infections No  Is there Decreased scar mobility Yes   [place] Sign No     Lymphedema Assessments   Lymphedema Assessments Upper extremities     Right Upper Extremity Lymphedema   10 cm Proximal to Olecranon Process 38.1 cm   Olecranon Process 31.5 cm   10 cm Proximal to Ulnar Styloid Process 28.7 cm   Just Proximal to Ulnar Styloid Process 21.2 cm   Across Hand at [employer] 19.8 cm   At [place] of 2nd Digit 6.7 cm     Left Upper Extremity Lymphedema   10 cm Proximal to Olecranon Process 37.4 cm   Olecranon Process 30.3 cm   10 cm Proximal to Ulnar Styloid Process 28.5 cm   Just Proximal to Ulnar Styloid Process 21.3 cm   Across Hand at [employer] 19.6 cm   At [place] of 2nd Digit 6.9 cm           [place] - 12/29/16 0001    Open a tight or new jar Mild difficulty   Do heavy household chores (wash walls, wash floors) Moderate difficulty   Carry a shopping bag or briefcase Mild difficulty   Wash your back Severe difficulty   Use a knife to cut food No difficulty   Recreational activities in which you take some force or impact through your arm, shoulder, or hand (golf, hammering, tennis) No difficulty   During the past week, to what extent has your arm, shoulder or hand problem interfered with your normal social activities with family, friends, neighbors, or groups? Slightly   During the past week, to what extent has your arm, shoulder or hand  problem limited your work or other regular daily activities [place]   Arm, shoulder, or hand pain. Mild   Tingling (pins and needles) in your arm, shoulder, or hand Severe   Difficulty Sleeping Mild difficulty   DASH Score 34.09 %                     PT Education - 12/29/16 0925    Education provided Yes   Education Details Lymphedema risk reduction and post op shoulder ROM HEP   Person(s) Educated Patient   Methods Explanation;Demonstration;Handout   Comprehension Returned demonstration;Verbalized understanding                [place] - 12/29/16 1145      [place] Term Goal  #1   Title Patient will verbalize understanding of risk reduction practices for lymphedema.   Time 4   Period Weeks   Status New     CC Long Term Goal  #2   Title Improve DASH score to be </= 15 for improved overall arm function.   Time 4   Period Weeks   Status New     CC Long Term Goal  #3   Title Increase left shoulder flexion to >/= 155 degrees for increased ease reaching overhead.   Time 4   Period Weeks   Status New     CC Long Term Goal  #4   Title Increase left shoulder abduction to >/= 155 degrees for increased ease reaching overhead and out to the side.   Time 4   Period Weeks   Status New     CC Long Term Goal  #5   Title Independent with a home exercise program for shoulder ROM and verbalize good understanding of importance of a walking program for reducing recurrance risk.  Time 4   Period Weeks   Status New            Plan - 12/29/16 1139    Clinical Impression Statement Patient is a very pleasant woman s/p right mastectomy for DCIS breast cancer on 10/28/16. She had a lat flap reconstruction with an expander placed at the same time. She had a right lumpectomy and axillary node dissection followed by chemo and radiation 20 years ago for invasive breast cancer. She currently has a would on her right rbeast centrally located which appers  to be healing well and is being closely followed by Dr. Iran Planas. She will benefit from PT to address shoulder ROM limitations and reduce lymphedma risk as she is already at high risk. Due to her lack of comorbidities, her eval is of low complexity.   Rehab Potential Excellent   Clinical Impairments Affecting Rehab Potential Breast wound   PT Frequency 2x / week   PT Duration 4 weeks   PT Treatment/Interventions ADLs/Self Care Home Management;Manual techniques;Manual lymph drainage;Scar mobilization;Passive range of motion;Patient/family education   PT Next Visit Plan Begin shoulder ROM exercises and PROM; postural education and exercises; will attend ABC class for lymphedema risk reduction information if able to based on work schedule.   PT Home Exercise Plan Issued HEP for her to begin shoulder ROM stretches.   Consulted and Agree with Plan of Care Patient      Patient will benefit from skilled therapeutic intervention in order to improve the following deficits and impairments:  Postural dysfunction, Decreased range of motion, Impaired UE functional use, Pain, Decreased knowledge of precautions  Visit Diagnosis: Stiffness of right shoulder, not elsewhere classified - Plan: PT plan of care cert/re-cert  Aftercare following surgery for neoplasm - Plan: PT plan of care cert/re-cert  Abnormal posture - Plan: PT plan of care cert/re-cert     Problem List Patient Active Problem List   Diagnosis Date Noted  . Breast cancer, right ([place]) 10/28/2016  . Genetic testing 10/01/2016  . Neck pain 12/02/2015  . Preoperative clearance 11/10/2015  . Peripheral edema 10/05/2015  . Cervical pain (neck) 10/05/2015  . S/P left THA, AA 06/23/2015  . Obese 05/13/2015  . Avascular necrosis of bones of both hips ([place]) 04/05/2015  . Essential hypertension 02/27/2015  . Muscle spasm 02/27/2015  . Pain in joint, shoulder region 09/05/2014  . Visit for preventive health examination 12/12/2013  . Leg  swelling 12/08/2013  . Other abnormal glucose 02/23/2012  . Nonspecific abnormal electrocardiogram (ECG) (EKG) 02/23/2012  . GERD 10/23/2009  . ANXIETY STATE, UNSPECIFIED 09/02/2009  . DEPRESSION 07/10/2008  . BREAST CANCER, HX OF 07/10/2008  . COLONIC POLYPS, HX OF 07/10/2008  . FIBROIDS, UTERUS 02/15/2008  . MIGRAINE HEADACHE 02/15/2008  . DILATION AND CURETTAGE, HX OF 02/15/2008  . Unspecified essential hypertension 09/25/2007    Annia Friendly, PT 12/29/16 11:49 AM  [place], [place], [address] Phone: [phone]   Fax:  [phone]  Name: Madeline Chandler MRN: [id number] Date of Birth: 1960/10/06

## 2017-01-03 ENCOUNTER — Ambulatory Visit: Payer: 59 | Admitting: Physical Therapy

## 2017-01-03 DIAGNOSIS — M25611 Stiffness of right shoulder, not elsewhere classified: Secondary | ICD-10-CM

## 2017-01-03 DIAGNOSIS — R293 Abnormal posture: Secondary | ICD-10-CM

## 2017-01-03 DIAGNOSIS — Z483 Aftercare following surgery for neoplasm: Secondary | ICD-10-CM

## 2017-01-03 NOTE — Patient Instructions (Signed)
SHOULDER: Flexion - Supine (Cane)        Cancer Rehab [phone]    Hold cane in both hands. Raise arms up overhead. Do not allow back to arch. Hold _5__ seconds. Do __5-10__ times; __1-2__ times a day.   SELF ASSISTED WITH OBJECT: Shoulder Abduction / Adduction - Supine    Hold cane with both hands. Move both arms from side to side, keep elbows straight.  Hold when stretch felt for __5__ seconds. Repeat __5-10__ times; __1-2__ times a day. Once this becomes easier progress to third picture bringing affected arm towards ear by staying out to side. Same hold for _5_seconds. Repeat  _5-10_ times, _1-2_ times/day.  Shoulder Blade Stretch    Clasp fingers behind head with elbows touching in front of face. Pull elbows back while pressing shoulder blades together. Relax and hold as tolerated, can place pillow under elbow here for comfort as needed and to allow for prolonged stretch.  Repeat __5__ times. Do __1-2__ sessions per day.     SHOULDER: External Rotation - Supine (Cane)    Hold cane with both hands. Rotate arm away from body. Keep elbow on floor and next to body. _5-10__ reps per set, hold 5 seconds, _1-2__ sets per day. Add towel to keep elbow at side.  Copyright  VHI. All rights reserved.      [employer] Courses Online Strength After Breast Cancer Look at the right of the page for Lymphedema Education Session  

## 2017-01-03 NOTE — Therapy (Signed)
[place], [place], [address] Phone: [phone]   Fax:  [phone]  Physical Therapy Treatment  Patient Details  Name: Madeline Chandler MRN: [id number] Date of Birth: 02-16-1961 Referring Provider: Dr. Irene Limbo  Encounter Date: 01/03/2017      PT End of Session - 01/03/17 1714    Visit Number 2   Number of Visits 12   Date for PT Re-Evaluation 01/26/17   PT Start Time 3536   PT Stop Time 1430   PT Time Calculation (min) 45 min   Activity Tolerance Patient tolerated treatment well      Past Medical History:  Diagnosis Date  . Anxiety   . Arthritis    "hips" (10/28/2016)  . Breast cancer, right breast ([facility]) 1997; 2018   S/P chemo & radiation;   . Bulging lumbar disc    2  . GERD (gastroesophageal reflux disease)   . History of colonic polyps    Dr Collene Mares  . Hypertension   . Migraine    "none in the last few years; had them mostly in late 20's/mid 30's" (10/28/2016)  . Pre-diabetes   . Restless leg syndrome     Past Surgical History:  Procedure Laterality Date  . ABDOMINAL HYSTERECTOMY  2000s  . ANTERIOR CERVICAL DECOMP/DISCECTOMY FUSION N/A 12/02/2015   Procedure: ANTERIOR CERVICAL DECOMPRESSION/DISCECTOMY FUSION C4 - C7  3 LEVELS;  Surgeon: Melina Schools, MD;  Location: [facility];  Service: Orthopedics;  Laterality: N/A;  . BACK SURGERY    . BREAST BIOPSY Right 1997; 08/2016  . BREAST LUMPECTOMY Right 1997  . COLONOSCOPY W/ POLYPECTOMY  2010   Dr Collene Mares  . DILATION AND CURETTAGE OF UTERUS  1990s  . JOINT REPLACEMENT    . LATISSIMUS FLAP TO BREAST Right 10/28/2016   Procedure: LATISSIMUS FLAP TO BREAST WITH PLACEMENT OF TISSUE EXPANDER ON THE RIGHT;  Surgeon: Irene Limbo, MD;  Location: [facility];  Service: Plastics;  Laterality: Right;  RFNA  . MASTECTOMY Right 10/28/2016  . RECONSTRUCTION BREAST W/ LATISSIMUS DORSI FLAP Right 10/28/2016    WITH PLACEMENT OF TISSUE EXPANDER   .  TISSUE EXPANDER PLACEMENT Right 10/28/2016   Procedure: TISSUE EXPANDER;  Surgeon: Irene Limbo, MD;  Location: [facility];  Service: Plastics;  Laterality: Right;  . TOTAL HIP ARTHROPLASTY Right 05/12/2015   Procedure: RIGHT TOTAL HIP ARTHROPLASTY ANTERIOR APPROACH;  Surgeon: Paralee Cancel, MD;  Location: [facility];  Service: Orthopedics;  Laterality: Right;  . TOTAL HIP ARTHROPLASTY Left 06/23/2015   Procedure: LEFT TOTAL HIP ARTHROPLASTY ANTERIOR APPROACH;  Surgeon: Paralee Cancel, MD;  Location: [facility];  Service: Orthopedics;  Laterality: Left;  . TOTAL MASTECTOMY Right 10/28/2016   Procedure: RIGHT MASTECTOMY;  Surgeon: Alphonsa Overall, MD;  Location: [facility];  Service: General;  Laterality: Right;    There were no vitals filed for this visit.      Subjective Assessment - 01/03/17 1357    Subjective Pt states that her arm is doing better but she still has trouble reaching forwar and also behind back.  She continues to do dressing changes on the open area on her right breast.    Pertinent History Right lumpectomy and axillary node dissection 1998 (approximate date) followed by chemo and radiation. Right mastectomy with reconstruction 10/28/16. No chemo or radiation needed. Bilateral hips replaced 2016.  Cervical fusion (Dr. Rolena Infante) 4/17.   Patient Stated Goals Wash back and reach overhead easier   Currently in Pain? No/denies  [facility] Adult PT Treatment/Exercise - 01/03/17 0001      Self-Care   Self-Care Other Self-Care Comments   Other Self-Care Comments  began education about lymphedema risk reduction      Shoulder Exercises: Supine   External Rotation AROM;Both;10 reps  hands behind head    Flexion AAROM;Both;10 reps   Flexion Limitations with dowel rod, feels pain in axilla, especially when intiating muscle contraction to pull  back down    ABduction AAROM;Right;10 reps   ABduction Limitations with dowel rod for stretch    Other Supine Exercises supine  with scapular retraction      Shoulder Exercises: Sidelying   External Rotation AROM;Right;10 reps   External Rotation Limitations pt with fatigue with 10 reps    ABduction AROM;Right;10 reps   ABduction Limitations pt with limited ROM can only point    Other Sidelying Exercises with hand pointed to ceiling small 10 small contolled circles in each direction    Other Sidelying Exercises upper arm stable, pt able to do elbow extension and flexion                 PT Education - 01/03/17 1719    Education provided Yes   Education Details continues lymphedema risk reduciton, added to HEP, gave weblink for klosetraining lymphedema risk reduction education    Person(s) Educated Patient   Methods Explanation;Demonstration;Handout   Comprehension Verbalized understanding;Returned demonstration                [facility] Goals - 12/29/16 1145      CC Long Term Goal  #1   Title Patient will verbalize understanding of risk reduction practices for lymphedema.   Time 4   Period Weeks   Status New     CC Long Term Goal  #2   Title Improve DASH score to be </= 15 for improved overall arm function.   Time 4   Period Weeks   Status New     CC Long Term Goal  #3   Title Increase left shoulder flexion to >/= 155 degrees for increased ease reaching overhead.   Time 4   Period Weeks   Status New     CC Long Term Goal  #4   Title Increase left shoulder abduction to >/= 155 degrees for increased ease reaching overhead and out to the side.   Time 4   Period Weeks   Status New     CC Long Term Goal  #5   Title Independent with a home exercise program for shoulder ROM and verbalize good understanding of importance of a walking program for reducing recurrance risk.   Time 4   Period Weeks   Status New            Plan - 01/03/17 1714    Clinical Impression Statement Pt is making gains in range of motion but still has pain and tightness in her right axilla.  Focused  on range of motion in supine and sidelying today and issued home program. Issued klosetraining website for online lymphedema risk reduction as she does not think she can get away from work to attend [employer] class.    Rehab Potential Excellent   Clinical Impairments Affecting Rehab Potential Breast wound   PT Frequency 2x / week   PT Duration 4 weeks   PT Next Visit Plan continue with shoulder ROM exercises and PROM; advance to standing wall stretches as tolerated with monitoring wound. work on scapular retraction and  postural education and exercises; review  lymphedema risk reduction information. later, educated about prophylactic compression sleeve.    Consulted and Agree with Plan of Care Patient      Patient will benefit from skilled therapeutic intervention in order to improve the following deficits and impairments:  Postural dysfunction, Decreased range of motion, Impaired UE functional use, Pain, Decreased knowledge of precautions  Visit Diagnosis: Stiffness of right shoulder, not elsewhere classified  Aftercare following surgery for neoplasm  Abnormal posture     Problem List Patient Active Problem List   Diagnosis Date Noted  . Breast cancer, right ([facility]) 10/28/2016  . Genetic testing 10/01/2016  . Neck pain 12/02/2015  . Preoperative clearance 11/10/2015  . Peripheral edema 10/05/2015  . Cervical pain (neck) 10/05/2015  . S/P left THA, AA 06/23/2015  . Obese 05/13/2015  . Avascular necrosis of bones of both hips ([facility]) 04/05/2015  . Essential hypertension 02/27/2015  . Muscle spasm 02/27/2015  . Pain in joint, shoulder region 09/05/2014  . Visit for preventive health examination 12/12/2013  . Leg swelling 12/08/2013  . Other abnormal glucose 02/23/2012  . Nonspecific abnormal electrocardiogram (ECG) (EKG) 02/23/2012  . GERD 10/23/2009  . ANXIETY STATE, UNSPECIFIED 09/02/2009  . DEPRESSION 07/10/2008  . BREAST CANCER, HX OF 07/10/2008  . COLONIC POLYPS, HX OF 07/10/2008   . FIBROIDS, UTERUS 02/15/2008  . MIGRAINE HEADACHE 02/15/2008  . DILATION AND CURETTAGE, HX OF 02/15/2008  . Unspecified essential hypertension 09/25/2007   Donato Heinz. Owens Shark PT  Norwood Levo 01/03/2017, 5:21 PM  [place], [place], [address] Phone: [phone]   Fax:  [phone]  Name: EVONNA STOLTZ MRN: [id number] Date of Birth: 1960/10/27

## 2017-01-05 ENCOUNTER — Ambulatory Visit: Payer: 59 | Admitting: Physical Therapy

## 2017-01-05 ENCOUNTER — Encounter: Payer: Self-pay | Admitting: Physical Therapy

## 2017-01-05 DIAGNOSIS — M25611 Stiffness of right shoulder, not elsewhere classified: Secondary | ICD-10-CM

## 2017-01-05 DIAGNOSIS — Z483 Aftercare following surgery for neoplasm: Secondary | ICD-10-CM

## 2017-01-05 DIAGNOSIS — R293 Abnormal posture: Secondary | ICD-10-CM

## 2017-01-05 NOTE — Therapy (Signed)
[place], [place], [address] Phone: [phone]   Fax:  [phone]  Physical Therapy Treatment  Patient Details  Name: Madeline Chandler MRN: [id number] Date of Birth: Mar 30, 1961 Referring Provider: Dr. Irene Limbo  Encounter Date: 01/05/2017      PT End of Session - 01/05/17 1046    Visit Number 3   Number of Visits 12   Date for PT Re-Evaluation 01/26/17   PT Start Time 0845   PT Stop Time 0930   PT Time Calculation (min) 45 min   Activity Tolerance Patient tolerated treatment well   Behavior During Therapy [facility] for tasks assessed/performed      Past Medical History:  Diagnosis Date  . Anxiety   . Arthritis    "hips" (10/28/2016)  . Breast cancer, right breast ([facility]) 1997; 2018   S/P chemo & radiation;   . Bulging lumbar disc    2  . GERD (gastroesophageal reflux disease)   . History of colonic polyps    Dr Collene Mares  . Hypertension   . Migraine    "none in the last few years; had them mostly in late 20's/mid 30's" (10/28/2016)  . Pre-diabetes   . Restless leg syndrome     Past Surgical History:  Procedure Laterality Date  . ABDOMINAL HYSTERECTOMY  2000s  . ANTERIOR CERVICAL DECOMP/DISCECTOMY FUSION N/A 12/02/2015   Procedure: ANTERIOR CERVICAL DECOMPRESSION/DISCECTOMY FUSION C4 - C7  3 LEVELS;  Surgeon: Melina Schools, MD;  Location: [facility];  Service: Orthopedics;  Laterality: N/A;  . BACK SURGERY    . BREAST BIOPSY Right 1997; 08/2016  . BREAST LUMPECTOMY Right 1997  . COLONOSCOPY W/ POLYPECTOMY  2010   Dr Collene Mares  . DILATION AND CURETTAGE OF UTERUS  1990s  . JOINT REPLACEMENT    . LATISSIMUS FLAP TO BREAST Right 10/28/2016   Procedure: LATISSIMUS FLAP TO BREAST WITH PLACEMENT OF TISSUE EXPANDER ON THE RIGHT;  Surgeon: Irene Limbo, MD;  Location: [facility];  Service: Plastics;  Laterality: Right;  RFNA  . MASTECTOMY Right 10/28/2016  . RECONSTRUCTION BREAST W/ LATISSIMUS DORSI FLAP  Right 10/28/2016    WITH PLACEMENT OF TISSUE EXPANDER   . TISSUE EXPANDER PLACEMENT Right 10/28/2016   Procedure: TISSUE EXPANDER;  Surgeon: Irene Limbo, MD;  Location: [facility];  Service: Plastics;  Laterality: Right;  . TOTAL HIP ARTHROPLASTY Right 05/12/2015   Procedure: RIGHT TOTAL HIP ARTHROPLASTY ANTERIOR APPROACH;  Surgeon: Paralee Cancel, MD;  [facility];  Service: Orthopedics;  Laterality: Right;  . TOTAL HIP ARTHROPLASTY Left 06/23/2015   Procedure: LEFT TOTAL HIP ARTHROPLASTY ANTERIOR APPROACH;  Surgeon: Paralee Cancel, MD;  [facility];  Service: Orthopedics;  Laterality: Left;  . TOTAL MASTECTOMY Right 10/28/2016   Procedure: RIGHT MASTECTOMY;  Surgeon: Alphonsa Overall, MD;  Location: [facility];  Service: General;  Laterality: Right;    There were no vitals filed for this visit.      Subjective Assessment - 01/05/17 0850    Subjective She worked me hard last time but I'm ok.   Pertinent History Right lumpectomy and axillary node dissection 1998 (approximate date) followed by chemo and radiation. Right mastectomy with reconstruction 10/28/16. No chemo or radiation needed. Bilateral hips replaced 2016.  Cervical fusion (Dr. Rolena Infante) 4/17.   Patient Stated Goals Wash back and reach overhead easier   Currently in Pain? No/denies            [facility] PT Assessment - 01/05/17 0001  AROM   Right Shoulder Extension 53 Degrees   Right Shoulder Flexion 150 Degrees   Right Shoulder ABduction 152 Degrees                     [facility] Adult PT Treatment/Exercise - 01/05/17 0001      Shoulder Exercises: Supine   Flexion AAROM;Both;10 reps   Flexion Limitations Used dowel rod; no c/o pain today   ABduction AAROM;Right;10 reps   ABduction Limitations with dowel rod for stretch      Shoulder Exercises: Standing   Other Standing Exercises Standing with back against wall trying to achieve upright posture with head, shoulders, and buttocks touching wall; held 30 seconds x2    Other Standing Exercises Finger ladder standing flexion x5 to pt tolerance      Shoulder Exercises: Pulleys   Flexion 3 minutes   Flexion Limitations No c/o pain; worked in painfree ROM     Shoulder Exercises: Therapy Ball   Flexion 10 reps   Flexion Limitations Limited by pt tolerance but achieved near full ROM; PT verbal cues for posture   ABduction 5 reps   ABduction Limitations Only on right side; pt c/o pain with 1st rep when she allowed herself to go too fast; verbal cues to go slow and within painfree ROM; pressure given manually to upper traps to reduce compensation     Manual Therapy   Manual Therapy Passive ROM;Neural Stretch;Soft tissue mobilization   Soft tissue mobilization Very briefly to bilateral upper rtaps which were sore and tight from compensation. Used Biotone; issed samples of biofreeze for her to use at home.   Passive ROM PROM right shoulder in supine to pt tolerance all planes focused on abduction and flexion   Neural Stretch To right UE in supine                        [facility] Goals - 12/29/16 1145      CC Long Term Goal  #1   Title Patient will verbalize understanding of risk reduction practices for lymphedema.   Time 4   Period Weeks   Status New     CC Long Term Goal  #2   Title Improve DASH score to be </= 15 for improved overall arm function.   Time 4   Period Weeks   Status New     CC Long Term Goal  #3   Title Increase left shoulder flexion to >/= 155 degrees for increased ease reaching overhead.   Time 4   Period Weeks   Status New     CC Long Term Goal  #4   Title Increase left shoulder abduction to >/= 155 degrees for increased ease reaching overhead and out to the side.   Time 4   Period Weeks   Status New     CC Long Term Goal  #5   Title Independent with a home exercise program for shoulder ROM and verbalize good understanding of importance of a walking program for reducing recurrance risk.   Time 4    Period Weeks   Status New            Plan - 01/05/17 1046    Clinical Impression Statement Patient made significant gains during passive range of motion and neural stretching. She reported discomfort at end ROM but gained ROM throughout session with no increased pain. She did have a c/o pain when she rolled a ball  up the wall into abduction but it was a quick sharp pain under her arm which resolved after a few minutes and she continued exercises after that. She needed encouragement to work in a pain free ROM but appears to be making good progress.   Rehab Potential Excellent   Clinical Impairments Affecting Rehab Potential Breast wound   PT Frequency 2x / week   PT Duration 4 weeks   PT Treatment/Interventions ADLs/Self Care Home Management;Manual techniques;Manual lymph drainage;Scar mobilization;Passive range of motion;Patient/family education   PT Next Visit Plan continue with shoulder ROM exercises and PROM as tolerated with monitoring wound. Scapular retraction and postural education/exercises   Consulted and Agree with Plan of Care Patient      Patient will benefit from skilled therapeutic intervention in order to improve the following deficits and impairments:  Postural dysfunction, Decreased range of motion, Impaired UE functional use, Pain, Decreased knowledge of precautions  Visit Diagnosis: Stiffness of right shoulder, not elsewhere classified  Aftercare following surgery for neoplasm  Abnormal posture     Problem List Patient Active Problem List   Diagnosis Date Noted  . Breast cancer, right ([facility]) 10/28/2016  . Genetic testing 10/01/2016  . Neck pain 12/02/2015  . Preoperative clearance 11/10/2015  . Peripheral edema 10/05/2015  . Cervical pain (neck) 10/05/2015  . S/P left THA, AA 06/23/2015  . Obese 05/13/2015  . Avascular necrosis of bones of both hips ([facility]) 04/05/2015  . Essential hypertension 02/27/2015  . Muscle spasm 02/27/2015  . Pain in joint,  shoulder region 09/05/2014  . Visit for preventive health examination 12/12/2013  . Leg swelling 12/08/2013  . Other abnormal glucose 02/23/2012  . Nonspecific abnormal electrocardiogram (ECG) (EKG) 02/23/2012  . GERD 10/23/2009  . ANXIETY STATE, UNSPECIFIED 09/02/2009  . DEPRESSION 07/10/2008  . BREAST CANCER, HX OF 07/10/2008  . COLONIC POLYPS, HX OF 07/10/2008  . FIBROIDS, UTERUS 02/15/2008  . MIGRAINE HEADACHE 02/15/2008  . DILATION AND CURETTAGE, HX OF 02/15/2008  . Unspecified essential hypertension 09/25/2007   Annia Friendly, PT 01/05/17 10:51 AM  [place], [place], [address] Phone: [phone]   Fax:  [phone]  Name: Madeline Chandler MRN: [id number] Date of Birth: Feb 11, 1961

## 2017-01-09 ENCOUNTER — Ambulatory Visit: Payer: 59 | Attending: Plastic Surgery

## 2017-01-09 DIAGNOSIS — M25611 Stiffness of right shoulder, not elsewhere classified: Secondary | ICD-10-CM

## 2017-01-09 DIAGNOSIS — R293 Abnormal posture: Secondary | ICD-10-CM

## 2017-01-09 DIAGNOSIS — Z483 Aftercare following surgery for neoplasm: Secondary | ICD-10-CM

## 2017-01-09 NOTE — Therapy (Signed)
[place], [place], [address] Phone: [phone]   Fax:  [phone]  Physical Therapy Treatment  Patient Details  Name: Madeline Chandler MRN: [id number] Date of Birth: 05/14/1961 Referring Provider: Dr. Irene Limbo  Encounter Date: 01/09/2017      PT End of Session - 01/09/17 0934    Visit Number 4   Number of Visits 12   Date for PT Re-Evaluation 01/26/17   PT Start Time 0845   PT Stop Time 0933   PT Time Calculation (min) 48 min   Activity Tolerance Patient tolerated treatment well   Behavior During Therapy [facility] for tasks assessed/performed      Past Medical History:  Diagnosis Date  . Anxiety   . Arthritis    "hips" (10/28/2016)  . Breast cancer, right breast ([place]) 1997; 2018   S/P chemo & radiation;   . Bulging lumbar disc    2  . GERD (gastroesophageal reflux disease)   . History of colonic polyps    Dr Collene Mares  . Hypertension   . Migraine    "none in the last few years; had them mostly in late 20's/mid 30's" (10/28/2016)  . Pre-diabetes   . Restless leg syndrome     Past Surgical History:  Procedure Laterality Date  . ABDOMINAL HYSTERECTOMY  2000s  . ANTERIOR CERVICAL DECOMP/DISCECTOMY FUSION N/A 12/02/2015   Procedure: ANTERIOR CERVICAL DECOMPRESSION/DISCECTOMY FUSION C4 - C7  3 LEVELS;  Surgeon: Melina Schools, MD;  Location: [place];  Service: Orthopedics;  Laterality: N/A;  . BACK SURGERY    . BREAST BIOPSY Right 1997; 08/2016  . BREAST LUMPECTOMY Right 1997  . COLONOSCOPY W/ POLYPECTOMY  2010   Dr Collene Mares  . DILATION AND CURETTAGE OF UTERUS  1990s  . JOINT REPLACEMENT    . LATISSIMUS FLAP TO BREAST Right 10/28/2016   Procedure: LATISSIMUS FLAP TO BREAST WITH PLACEMENT OF TISSUE EXPANDER ON THE RIGHT;  Surgeon: Irene Limbo, MD;  Location: [place];  Service: Plastics;  Laterality: Right;  RFNA  . MASTECTOMY Right 10/28/2016  . RECONSTRUCTION BREAST W/ LATISSIMUS DORSI FLAP Right  10/28/2016    WITH PLACEMENT OF TISSUE EXPANDER   . TISSUE EXPANDER PLACEMENT Right 10/28/2016   Procedure: TISSUE EXPANDER;  Surgeon: Irene Limbo, MD;  Location: [place];  Service: Plastics;  Laterality: Right;  . TOTAL HIP ARTHROPLASTY Right 05/12/2015   Procedure: RIGHT TOTAL HIP ARTHROPLASTY ANTERIOR APPROACH;  Surgeon: Paralee Cancel, MD;  Location: [facility];  Service: Orthopedics;  Laterality: Right;  . TOTAL HIP ARTHROPLASTY Left 06/23/2015   Procedure: LEFT TOTAL HIP ARTHROPLASTY ANTERIOR APPROACH;  Surgeon: Paralee Cancel, MD;  Location: [facility];  Service: Orthopedics;  Laterality: Left;  . TOTAL MASTECTOMY Right 10/28/2016   Procedure: RIGHT MASTECTOMY;  Surgeon: Alphonsa Overall, MD;  Location: [place];  Service: General;  Laterality: Right;    There were no vitals filed for this visit.      Subjective Assessment - 01/09/17 0846    Subjective I felt sore after last visit but a good sore. My back is really bothering me today, but that's because I stood alot over the weekend which happens often. I also think I might have slept wrong to make it worse.   Pertinent History Right lumpectomy and axillary node dissection 1998 (approximate date) followed by chemo and radiation. Right mastectomy with reconstruction 10/28/16. No chemo or radiation needed. Bilateral hips replaced 2016.  Cervical fusion (Dr. Rolena Infante) 4/17.   Patient Stated  Goals Wash back and reach overhead easier   Currently in Pain? Yes   Pain Score 6    Pain Location Back   Pain Orientation Lower   Pain Descriptors / Indicators Dull   Pain Type Chronic pain   Pain Onset More than a month ago   Pain Frequency Intermittent   Aggravating Factors  prolonged standing, it has beothered me ever since my hip surgeries in 2016   Pain Relieving Factors sitting down and resting                         [facility] Adult PT Treatment/Exercise - 01/09/17 0001      Shoulder Exercises: Standing   Other Standing Exercises Standing  with back against wall trying to achieve upright posture with head, shoulders, and buttocks touching wall for bil UE abduction 5 times with 3 second holds, VC to decrease Rt shoulder hike throughout.      Shoulder Exercises: Pulleys   Flexion 2 minutes   Flexion Limitations No c/o pain, VC required to decrease Rt shoulder hiking   ABduction 2 minutes   ABduction Limitations VC to decrease Rt shoulder hiking     Shoulder Exercises: Therapy Ball   Flexion 10 reps   Flexion Limitations Pt tolerated this better today watching her pace   ABduction 5 reps  Rt UE with side lean into end of stretch   ABduction Limitations Pt tolerated this much better today remembering slow pace. Just required mod tactile cuing to decrease Rt shoulder hike.      Manual Therapy   Manual Therapy Passive ROM;Neural Stretch;Soft tissue mobilization   Passive ROM PROM right shoulder in supine to pt tolerance all planes focused on abduction and flexion   Neural Stretch To right UE in supine                        [facility] Goals - 12/29/16 1145      CC Long Term Goal  #1   Title Patient will verbalize understanding of risk reduction practices for lymphedema.   Time 4   Period Weeks   Status New     CC Long Term Goal  #2   Title Improve DASH score to be </= 15 for improved overall arm function.   Time 4   Period Weeks   Status New     CC Long Term Goal  #3   Title Increase left shoulder flexion to >/= 155 degrees for increased ease reaching overhead.   Time 4   Period Weeks   Status New     CC Long Term Goal  #4   Title Increase left shoulder abduction to >/= 155 degrees for increased ease reaching overhead and out to the side.   Time 4   Period Weeks   Status New     CC Long Term Goal  #5   Title Independent with a home exercise program for shoulder ROM and verbalize good understanding of importance of a walking program for reducing recurrance risk.   Time 4   Period Weeks    Status New            Plan - 01/09/17 0935    Clinical Impression Statement Pt tolerated session much better today being able to control her Rt shoulder hike with vc's for reminders, controlling her pace with AA/ROM exercises and did not experience pain with P/ROM at end ROM. She  reports feeling improvements since last week.    Rehab Potential Excellent   Clinical Impairments Affecting Rehab Potential Breast wound   PT Frequency 2x / week   PT Duration 4 weeks   PT Treatment/Interventions ADLs/Self Care Home Management;Manual techniques;Manual lymph drainage;Scar mobilization;Passive range of motion;Patient/family education   PT Next Visit Plan Assess ROM next visit. continue with shoulder ROM exercises and PROM as tolerated with monitoring wound. Scapular retraction and postural education/exercises   Consulted and Agree with Plan of Care Patient      Patient will benefit from skilled therapeutic intervention in order to improve the following deficits and impairments:  Postural dysfunction, Decreased range of motion, Impaired UE functional use, Pain, Decreased knowledge of precautions  Visit Diagnosis: Stiffness of right shoulder, not elsewhere classified  Aftercare following surgery for neoplasm  Abnormal posture     Problem List Patient Active Problem List   Diagnosis Date Noted  . Breast cancer, right ([place]) 10/28/2016  . Genetic testing 10/01/2016  . Neck pain 12/02/2015  . Preoperative clearance 11/10/2015  . Peripheral edema 10/05/2015  . Cervical pain (neck) 10/05/2015  . S/P left THA, AA 06/23/2015  . Obese 05/13/2015  . Avascular necrosis of bones of both hips ([place]) 04/05/2015  . Essential hypertension 02/27/2015  . Muscle spasm 02/27/2015  . Pain in joint, shoulder region 09/05/2014  . Visit for preventive health examination 12/12/2013  . Leg swelling 12/08/2013  . Other abnormal glucose 02/23/2012  . Nonspecific abnormal electrocardiogram (ECG) (EKG)  02/23/2012  . GERD 10/23/2009  . ANXIETY STATE, UNSPECIFIED 09/02/2009  . DEPRESSION 07/10/2008  . BREAST CANCER, HX OF 07/10/2008  . COLONIC POLYPS, HX OF 07/10/2008  . FIBROIDS, UTERUS 02/15/2008  . MIGRAINE HEADACHE 02/15/2008  . DILATION AND CURETTAGE, HX OF 02/15/2008  . Unspecified essential hypertension 09/25/2007    Otelia Limes, PTA 01/09/2017, 9:37 AM  [place], [place], [address] Phone: [phone]   Fax:  [phone]  Name: RUTA CAPECE MRN: [id number] Date of Birth: 03/31/1961

## 2017-01-12 ENCOUNTER — Encounter: Payer: Self-pay | Admitting: Physical Therapy

## 2017-01-12 ENCOUNTER — Ambulatory Visit: Payer: 59 | Admitting: Physical Therapy

## 2017-01-12 DIAGNOSIS — Z483 Aftercare following surgery for neoplasm: Secondary | ICD-10-CM

## 2017-01-12 DIAGNOSIS — M25611 Stiffness of right shoulder, not elsewhere classified: Secondary | ICD-10-CM | POA: Diagnosis not present

## 2017-01-12 DIAGNOSIS — R293 Abnormal posture: Secondary | ICD-10-CM

## 2017-01-12 NOTE — Therapy (Signed)
[place], [place], [address] Phone: [phone]   Fax:  [phone]  Physical Therapy Treatment  Patient Details  Name: Madeline Chandler MRN: [id number] Date of Birth: May 05, 1961 Referring Provider: Dr. Irene Limbo  Encounter Date: 01/12/2017      PT End of Session - 01/12/17 1157    Visit Number 5   Number of Visits 12   Date for PT Re-Evaluation 01/26/17   PT Start Time 1101   PT Stop Time 1146   PT Time Calculation (min) 45 min   Activity Tolerance Patient tolerated treatment well   Behavior During Therapy [facility] for tasks assessed/performed      Past Medical History:  Diagnosis Date  . Anxiety   . Arthritis    "hips" (10/28/2016)  . Breast cancer, right breast ([place]) 1997; 2018   S/P chemo & radiation;   . Bulging lumbar disc    2  . GERD (gastroesophageal reflux disease)   . History of colonic polyps    Dr Collene Mares  . Hypertension   . Migraine    "none in the last few years; had them mostly in late 20's/mid 30's" (10/28/2016)  . Pre-diabetes   . Restless leg syndrome     Past Surgical History:  Procedure Laterality Date  . ABDOMINAL HYSTERECTOMY  2000s  . ANTERIOR CERVICAL DECOMP/DISCECTOMY FUSION N/A 12/02/2015   Procedure: ANTERIOR CERVICAL DECOMPRESSION/DISCECTOMY FUSION C4 - C7  3 LEVELS;  Surgeon: Melina Schools, MD;  Location: [place];  Service: Orthopedics;  Laterality: N/A;  . BACK SURGERY    . BREAST BIOPSY Right 1997; 08/2016  . BREAST LUMPECTOMY Right 1997  . COLONOSCOPY W/ POLYPECTOMY  2010   Dr Collene Mares  . DILATION AND CURETTAGE OF UTERUS  1990s  . JOINT REPLACEMENT    . LATISSIMUS FLAP TO BREAST Right 10/28/2016   Procedure: LATISSIMUS FLAP TO BREAST WITH PLACEMENT OF TISSUE EXPANDER ON THE RIGHT;  Surgeon: Irene Limbo, MD;  Location: [place];  Service: Plastics;  Laterality: Right;  RFNA  . MASTECTOMY Right 10/28/2016  . RECONSTRUCTION BREAST W/ LATISSIMUS DORSI FLAP Right  10/28/2016    WITH PLACEMENT OF TISSUE EXPANDER   . TISSUE EXPANDER PLACEMENT Right 10/28/2016   Procedure: TISSUE EXPANDER;  Surgeon: Irene Limbo, MD;  Location: [place];  Service: Plastics;  Laterality: Right;  . TOTAL HIP ARTHROPLASTY Right 05/12/2015   Procedure: RIGHT TOTAL HIP ARTHROPLASTY ANTERIOR APPROACH;  Surgeon: Paralee Cancel, MD;  [facility];  Service: Orthopedics;  Laterality: Right;  . TOTAL HIP ARTHROPLASTY Left 06/23/2015   Procedure: LEFT TOTAL HIP ARTHROPLASTY ANTERIOR APPROACH;  Surgeon: Paralee Cancel, MD;  [facility];  Service: Orthopedics;  Laterality: Left;  . TOTAL MASTECTOMY Right 10/28/2016   Procedure: RIGHT MASTECTOMY;  Surgeon: Alphonsa Overall, MD;  Location: [place];  Service: General;  Laterality: Right;    There were no vitals filed for this visit.      Subjective Assessment - 01/12/17 1103    Subjective My shoulder is feeling okay. It is feeling a little stiff at the top. I have been leaning on it a little bit. I am doing my exercises but not as many. When I am sitting at my desk I squeeze my shoulder blades together to sit up straight.    Pertinent History Right lumpectomy and axillary node dissection 1998 (approximate date) followed by chemo and radiation. Right mastectomy with reconstruction 10/28/16. No chemo or radiation needed. Bilateral hips replaced 2016.  Cervical fusion (Dr. Rolena Infante) 4/17.   Patient Stated Goals Wash back and reach overhead easier   Currently in Pain? No/denies   Pain Score 0-No pain            [facility] PT Assessment - 01/12/17 0001      AROM   Right Shoulder Extension 59 Degrees   Right Shoulder Flexion 157 Degrees   Right Shoulder ABduction 165 Degrees                     [facility] Adult PT Treatment/Exercise - 01/12/17 0001      Shoulder Exercises: Standing   Retraction Strengthening;Both;10 reps;Theraband   Theraband Level (Shoulder Retraction) Level 1 (Yellow)     Shoulder Exercises: Pulleys   Flexion  2 minutes   Flexion Limitations No c/o pain, VC required to decrease Rt shoulder hiking   ABduction 2 minutes     Shoulder Exercises: Therapy Ball   Flexion 10 reps   ABduction 10 reps  Rt UE with side lean into end of stretch     Manual Therapy   Manual Therapy Passive ROM;Neural Stretch;Soft tissue mobilization   Passive ROM PROM right shoulder in supine to pt tolerance all planes focused on abduction and flexion   Neural Stretch To right UE in supine                        [facility] Goals - 01/12/17 1155      CC Long Term Goal  #1   Title Patient will verbalize understanding of risk reduction practices for lymphedema.   Time 4   Period Weeks   Status On-going     CC Long Term Goal  #2   Title Improve DASH score to be </= 15 for improved overall arm function.   Time 4   Status On-going     CC Long Term Goal  #3   Title Increase left shoulder flexion to >/= 155 degrees for increased ease reaching overhead.   Baseline 157 degrees   Time 4   Period Weeks   Status On-going     CC Long Term Goal  #4   Title Increase left shoulder abduction to >/= 155 degrees for increased ease reaching overhead and out to the side.   Baseline 165 degrees   Time 4   Period Weeks   Status On-going     CC Long Term Goal  #5   Title Independent with a home exercise program for shoulder ROM and verbalize good understanding of importance of a walking program for reducing recurrance risk.   Time 4   Period Weeks   Status On-going            Plan - 01/12/17 1157    Clinical Impression Statement Patient demonstrates significant improvement in her right shoulder range of motion today. She has met her range of motion goals. Her range of motion was measured following PROM so this may has helped. She required less verbal cues to avoid shoulder hiking today. She also did not complain of pain with exercises.    Rehab Potential Excellent   Clinical Impairments Affecting  Rehab Potential Breast wound   PT Frequency 2x / week   PT Duration 4 weeks   PT Treatment/Interventions ADLs/Self Care Home Management;Manual techniques;Manual lymph drainage;Scar mobilization;Passive range of motion;Patient/family education   PT Next Visit Plan  continue with shoulder ROM exercises and PROM as tolerated with monitoring wound. Scapular  retraction and postural education/exercises   PT Home Exercise Plan Issued HEP for her to begin shoulder ROM stretches.   Consulted and Agree with Plan of Care Patient      Patient will benefit from skilled therapeutic intervention in order to improve the following deficits and impairments:  Postural dysfunction, Decreased range of motion, Impaired UE functional use, Pain, Decreased knowledge of precautions  Visit Diagnosis: Stiffness of right shoulder, not elsewhere classified  Aftercare following surgery for neoplasm  Abnormal posture     Problem List Patient Active Problem List   Diagnosis Date Noted  . Breast cancer, right ([place]) 10/28/2016  . Genetic testing 10/01/2016  . Neck pain 12/02/2015  . Preoperative clearance 11/10/2015  . Peripheral edema 10/05/2015  . Cervical pain (neck) 10/05/2015  . S/P left THA, AA 06/23/2015  . Obese 05/13/2015  . Avascular necrosis of bones of both hips ([place]) 04/05/2015  . Essential hypertension 02/27/2015  . Muscle spasm 02/27/2015  . Pain in joint, shoulder region 09/05/2014  . Visit for preventive health examination 12/12/2013  . Leg swelling 12/08/2013  . Other abnormal glucose 02/23/2012  . Nonspecific abnormal electrocardiogram (ECG) (EKG) 02/23/2012  . GERD 10/23/2009  . ANXIETY STATE, UNSPECIFIED 09/02/2009  . DEPRESSION 07/10/2008  . BREAST CANCER, HX OF 07/10/2008  . COLONIC POLYPS, HX OF 07/10/2008  . FIBROIDS, UTERUS 02/15/2008  . MIGRAINE HEADACHE 02/15/2008  . DILATION AND CURETTAGE, HX OF 02/15/2008  . Unspecified essential hypertension 09/25/2007    Allyson Sabal [facility] 01/12/2017, 12:00 PM  [place], [place], [address] Phone: [phone]   Fax:  [phone]  Name: Madeline Chandler MRN: [id number] Date of Birth: 1961-02-28  Manus Gunning, PT 01/12/17 12:00 PM

## 2017-01-24 ENCOUNTER — Ambulatory Visit: Payer: 59

## 2017-01-24 DIAGNOSIS — R293 Abnormal posture: Secondary | ICD-10-CM

## 2017-01-24 DIAGNOSIS — M25611 Stiffness of right shoulder, not elsewhere classified: Secondary | ICD-10-CM | POA: Diagnosis not present

## 2017-01-24 DIAGNOSIS — Z483 Aftercare following surgery for neoplasm: Secondary | ICD-10-CM

## 2017-01-24 NOTE — Therapy (Signed)
[place], [place], [address] Phone: [phone]   Fax:  [phone]  Physical Therapy Treatment  Patient Details  Name: Madeline Chandler MRN: [id number] Date of Birth: 1960-11-03 Referring Provider: Dr. Irene Limbo  Encounter Date: 01/24/2017      PT End of Session - 01/24/17 0950    Visit Number 6   Number of Visits 12   Date for PT Re-Evaluation 01/26/17   PT Start Time 0852   PT Stop Time 0936   PT Time Calculation (min) 44 min   Activity Tolerance Patient tolerated treatment well   Behavior During Therapy [facility] for tasks assessed/performed      Past Medical History:  Diagnosis Date  . Anxiety   . Arthritis    "hips" (10/28/2016)  . Breast cancer, right breast ([place]) 1997; 2018   S/P chemo & radiation;   . Bulging lumbar disc    2  . GERD (gastroesophageal reflux disease)   . History of colonic polyps    Dr Collene Mares  . Hypertension   . Migraine    "none in the last few years; had them mostly in late 20's/mid 30's" (10/28/2016)  . Pre-diabetes   . Restless leg syndrome     Past Surgical History:  Procedure Laterality Date  . ABDOMINAL HYSTERECTOMY  2000s  . ANTERIOR CERVICAL DECOMP/DISCECTOMY FUSION N/A 12/02/2015   Procedure: ANTERIOR CERVICAL DECOMPRESSION/DISCECTOMY FUSION C4 - C7  3 LEVELS;  Surgeon: Melina Schools, MD;  Location: [place];  Service: Orthopedics;  Laterality: N/A;  . BACK SURGERY    . BREAST BIOPSY Right 1997; 08/2016  . BREAST LUMPECTOMY Right 1997  . COLONOSCOPY W/ POLYPECTOMY  2010   Dr Collene Mares  . DILATION AND CURETTAGE OF UTERUS  1990s  . JOINT REPLACEMENT    . LATISSIMUS FLAP TO BREAST Right 10/28/2016   Procedure: LATISSIMUS FLAP TO BREAST WITH PLACEMENT OF TISSUE EXPANDER ON THE RIGHT;  Surgeon: Irene Limbo, MD;  Location: [place];  Service: Plastics;  Laterality: Right;  RFNA  . MASTECTOMY Right 10/28/2016  . RECONSTRUCTION BREAST W/ LATISSIMUS DORSI FLAP  Right 10/28/2016    WITH PLACEMENT OF TISSUE EXPANDER   . TISSUE EXPANDER PLACEMENT Right 10/28/2016   Procedure: TISSUE EXPANDER;  Surgeon: Irene Limbo, MD;  Location: [place];  Service: Plastics;  Laterality: Right;  . TOTAL HIP ARTHROPLASTY Right 05/12/2015   Procedure: RIGHT TOTAL HIP ARTHROPLASTY ANTERIOR APPROACH;  Surgeon: Paralee Cancel, MD;  [facility];  Service: Orthopedics;  Laterality: Right;  . TOTAL HIP ARTHROPLASTY Left 06/23/2015   Procedure: LEFT TOTAL HIP ARTHROPLASTY ANTERIOR APPROACH;  Surgeon: Paralee Cancel, MD;  [facility];  Service: Orthopedics;  Laterality: Left;  . TOTAL MASTECTOMY Right 10/28/2016   Procedure: RIGHT MASTECTOMY;  Surgeon: Alphonsa Overall, MD;  Location: [place];  Service: General;  Laterality: Right;    There were no vitals filed for this visit.      Subjective Assessment - 01/24/17 0855    Subjective My Rt shoulder is doing pretty well but my Lt scapular area is pretty tight. I think it's how I've been sleeping. My incision is healing really well though. I see Dr. Iran Planas next Thursday and we will hopefully talk about when I can complete my reconstruction.    Pertinent History Right lumpectomy and axillary node dissection 1998 (approximate date) followed by chemo and radiation. Right mastectomy with reconstruction 10/28/16. No chemo or radiation needed. Bilateral hips replaced 2016.  Cervical fusion (  Dr. Rolena Infante) 4/17.   Patient Stated Goals Wash back and reach overhead easier   Currently in Pain? No/denies            [facility] PT Assessment - 01/24/17 0001      AROM   Right Shoulder Flexion 153 Degrees  Pt performed with min-no compensations today after VC   Right Shoulder ABduction 152 Degrees                     [facility] Adult PT Treatment/Exercise - 01/24/17 0001      Self-Care   Self-Care Posture   Posture Instructed pt in importance of posture and how this will help decrease flare ups of Lt sided neck/scapular pain she  has been experiencing intermittently. Instructed her in proper sitting posture at computer desk and how to perform cervical retraction throughout day (when driving, at desk, in supine) to help further promote erect posture and to strengthen these muscle by doing chin tucks into car head rest (at stop lights) and when supine in bed into pillow. Can also hold chin tuck while performing some of exercises issued to HEP today when tolerable/able.   Other Self-Care Comments  Assessed pts healing incision before adding supine scapular series to exercises today and this looks mostly healed, closed and is not still draining. Pt also instructed to tstop exercises if she feels pull at incision and she verbalized understanding this.     Shoulder Exercises: Supine   Horizontal ABduction Strengthening;Both;10 reps;Theraband   Theraband Level (Shoulder Horizontal ABduction) Level 2 (Red)   External Rotation Strengthening;Both;10 reps;Theraband   Theraband Level (Shoulder External Rotation) Level 2 (Red)   Flexion Strengthening;Both;5 reps;Theraband  Narrow and Wide Grip, 5 times each   Theraband Level (Shoulder Flexion) Level 2 (Red)   Other Supine Exercises Bil D2 with red theraband 5 times each side returning therapist demonstration     Shoulder Exercises: Pulleys   Flexion 2 minutes   ABduction 2 minutes   ABduction Limitations VC to decrease Rt shoulder hike     Shoulder Exercises: Therapy Ball   Flexion 10 reps  Forward lean into end of stretch   ABduction 10 reps  Rt UE with side lean into end of stretch     Manual Therapy   Manual Therapy --   Passive ROM --   Neural Stretch --                PT Education - 01/24/17 0944    Education provided Yes   Education Details Supine scapular series with red theraband instructing pt that if she felt any pull at mostly healed incision to stop that particular exercise though pt did not report feeling any pulling here today.    Person(s) Educated  Patient   Methods Explanation;Demonstration;Handout;Verbal cues   Comprehension Verbalized understanding;Returned demonstration                Long Term Clinic Goals - 01/12/17 1155      CC Long Term Goal  #1   Title Patient will verbalize understanding of risk reduction practices for lymphedema.   Time 4   Period Weeks   Status On-going     CC Long Term Goal  #2   Title Improve DASH score to be </= 15 for improved overall arm function.   Time 4   Status On-going     CC Long Term Goal  #3   Title Increase left shoulder flexion to >/= 155 degrees for  increased ease reaching overhead.   Baseline 157 degrees   Time 4   Period Weeks   Status On-going     CC Long Term Goal  #4   Title Increase left shoulder abduction to >/= 155 degrees for increased ease reaching overhead and out to the side.   Baseline 165 degrees   Time 4   Period Weeks   Status On-going     CC Long Term Goal  #5   Title Independent with a home exercise program for shoulder ROM and verbalize good understanding of importance of a walking program for reducing recurrance risk.   Time 4   Period Weeks   Status On-going            Plan - 01/24/17 0951    Clinical Impression Statement Pt continues with overall great gains. Her A/ROM did not imporve from last week, actually was a few degrees less however, her compensation was much improved accounting for change in motion. She did very well with instruction of new HEP today and did not report feeling any pull at incision, but knows to stop certain exercise if she does to avoid interruption of almost healed incision. Pt also did well with instruction of importance of correct posture to further aid her rehabilitation, asking appropriate questions throughout. Pt conts with steady progress towards goals.    Rehab Potential Excellent   Clinical Impairments Affecting Rehab Potential Breast wound   PT Frequency 2x / week   PT Duration 4 weeks   PT  Treatment/Interventions ADLs/Self Care Home Management;Manual techniques;Manual lymph drainage;Scar mobilization;Passive range of motion;Patient/family education   PT Next Visit Plan  continue with shoulder ROM exercises and PROM as tolerated with monitoring wound. Review HEP issued today.    PT Home Exercise Plan Supine scapular series with red (or yellow for more difficult exercises) theraband; cont ROM HEP and stretching throughout day   Consulted and Agree with Plan of Care Patient      Patient will benefit from skilled therapeutic intervention in order to improve the following deficits and impairments:  Postural dysfunction, Decreased range of motion, Impaired UE functional use, Pain, Decreased knowledge of precautions  Visit Diagnosis: Stiffness of right shoulder, not elsewhere classified  Aftercare following surgery for neoplasm  Abnormal posture     Problem List Patient Active Problem List   Diagnosis Date Noted  . Breast cancer, right ([place]) 10/28/2016  . Genetic testing 10/01/2016  . Neck pain 12/02/2015  . Preoperative clearance 11/10/2015  . Peripheral edema 10/05/2015  . Cervical pain (neck) 10/05/2015  . S/P left THA, AA 06/23/2015  . Obese 05/13/2015  . Avascular necrosis of bones of both hips ([place]) 04/05/2015  . Essential hypertension 02/27/2015  . Muscle spasm 02/27/2015  . Pain in joint, shoulder region 09/05/2014  . Visit for preventive health examination 12/12/2013  . Leg swelling 12/08/2013  . Other abnormal glucose 02/23/2012  . Nonspecific abnormal electrocardiogram (ECG) (EKG) 02/23/2012  . GERD 10/23/2009  . ANXIETY STATE, UNSPECIFIED 09/02/2009  . DEPRESSION 07/10/2008  . BREAST CANCER, HX OF 07/10/2008  . COLONIC POLYPS, HX OF 07/10/2008  . FIBROIDS, UTERUS 02/15/2008  . MIGRAINE HEADACHE 02/15/2008  . DILATION AND CURETTAGE, HX OF 02/15/2008  . Unspecified essential hypertension 09/25/2007    Otelia Limes, PTA 01/24/2017, 9:57  AM  [place], [place], [address] Phone: [phone]   Fax:  [phone]  Name: MARICARMEN BRAZIEL MRN: [id number] Date of Birth:  07/07/1961   

## 2017-01-24 NOTE — Patient Instructions (Signed)

## 2017-01-27 ENCOUNTER — Encounter: Payer: Self-pay | Admitting: Physical Therapy

## 2017-01-27 ENCOUNTER — Ambulatory Visit: Payer: 59 | Admitting: Physical Therapy

## 2017-01-27 DIAGNOSIS — R293 Abnormal posture: Secondary | ICD-10-CM

## 2017-01-27 DIAGNOSIS — M25611 Stiffness of right shoulder, not elsewhere classified: Secondary | ICD-10-CM | POA: Diagnosis not present

## 2017-01-27 DIAGNOSIS — Z483 Aftercare following surgery for neoplasm: Secondary | ICD-10-CM

## 2017-01-27 NOTE — Therapy (Signed)
[place], [place], [address] Phone: [phone]   Fax:  [phone]  Physical Therapy Treatment  Patient Details  Name: Madeline Chandler MRN: [id number] Date of Birth: 1961/03/07 Referring Provider: Dr. Irene Limbo  Encounter Date: 01/27/2017      PT End of Session - 01/27/17 1036    Visit Number 7   Number of Visits 20   Date for PT Re-Evaluation 02/24/17   PT Start Time 0934   PT Stop Time 1020   PT Time Calculation (min) 46 min   Activity Tolerance Patient tolerated treatment well   Behavior During Therapy [facility] for tasks assessed/performed      Past Medical History:  Diagnosis Date  . Anxiety   . Arthritis    "hips" (10/28/2016)  . Breast cancer, right breast ([place]) 1997; 2018   S/P chemo & radiation;   . Bulging lumbar disc    2  . GERD (gastroesophageal reflux disease)   . History of colonic polyps    Dr Collene Mares  . Hypertension   . Migraine    "none in the last few years; had them mostly in late 20's/mid 30's" (10/28/2016)  . Pre-diabetes   . Restless leg syndrome     Past Surgical History:  Procedure Laterality Date  . ABDOMINAL HYSTERECTOMY  2000s  . ANTERIOR CERVICAL DECOMP/DISCECTOMY FUSION N/A 12/02/2015   Procedure: ANTERIOR CERVICAL DECOMPRESSION/DISCECTOMY FUSION C4 - C7  3 LEVELS;  Surgeon: Melina Schools, MD;  Location: [place];  Service: Orthopedics;  Laterality: N/A;  . BACK SURGERY    . BREAST BIOPSY Right 1997; 08/2016  . BREAST LUMPECTOMY Right 1997  . COLONOSCOPY W/ POLYPECTOMY  2010   Dr Collene Mares  . DILATION AND CURETTAGE OF UTERUS  1990s  . JOINT REPLACEMENT    . LATISSIMUS FLAP TO BREAST Right 10/28/2016   Procedure: LATISSIMUS FLAP TO BREAST WITH PLACEMENT OF TISSUE EXPANDER ON THE RIGHT;  Surgeon: Irene Limbo, MD;  Location: [place];  Service: Plastics;  Laterality: Right;  RFNA  . MASTECTOMY Right 10/28/2016  . RECONSTRUCTION BREAST W/ LATISSIMUS DORSI FLAP  Right 10/28/2016    WITH PLACEMENT OF TISSUE EXPANDER   . TISSUE EXPANDER PLACEMENT Right 10/28/2016   Procedure: TISSUE EXPANDER;  Surgeon: Irene Limbo, MD;  Location: [place];  Service: Plastics;  Laterality: Right;  . TOTAL HIP ARTHROPLASTY Right 05/12/2015   Procedure: RIGHT TOTAL HIP ARTHROPLASTY ANTERIOR APPROACH;  Surgeon: Paralee Cancel, MD;  [facility];  Service: Orthopedics;  Laterality: Right;  . TOTAL HIP ARTHROPLASTY Left 06/23/2015   Procedure: LEFT TOTAL HIP ARTHROPLASTY ANTERIOR APPROACH;  Surgeon: Paralee Cancel, MD;  [facility];  Service: Orthopedics;  Laterality: Left;  . TOTAL MASTECTOMY Right 10/28/2016   Procedure: RIGHT MASTECTOMY;  Surgeon: Alphonsa Overall, MD;  Location: [place];  Service: General;  Laterality: Right;    There were no vitals filed for this visit.      Subjective Assessment - 01/27/17 0937    Subjective I am so sore all over. I moved and I have been going up and down the stairs. I paid people to move all the furniture.    Pertinent History Right lumpectomy and axillary node dissection 1998 (approximate date) followed by chemo and radiation. Right mastectomy with reconstruction 10/28/16. No chemo or radiation needed. Bilateral hips replaced 2016.  Cervical fusion (Dr. Rolena Infante) 4/17.   Patient Stated Goals Wash back and reach overhead easier   Currently in Pain? Yes  Pain Score 5    Pain Location Back   Pain Orientation Lower   Pain Descriptors / Indicators Dull   Pain Type Chronic pain   Pain Onset More than a month ago   Pain Frequency Intermittent                  Katina Dung - 01/27/17 0001    Open a tight or new jar No difficulty   Do heavy household chores (wash walls, wash floors) Mild difficulty   Carry a shopping bag or briefcase Mild difficulty   Wash your back Moderate difficulty   Use a knife to cut food No difficulty   Recreational activities in which you take some force or impact through your arm, shoulder, or hand  (golf, hammering, tennis) Mild difficulty   During the past week, to what extent has your arm, shoulder or hand problem interfered with your normal social activities with family, friends, neighbors, or groups? Not at all   During the past week, to what extent has your arm, shoulder or hand problem limited your work or other regular daily activities Slightly   Arm, shoulder, or hand pain. Mild   Tingling (pins and needles) in your arm, shoulder, or hand None   Difficulty Sleeping Mild difficulty   DASH Score 18.18 %               [facility] Adult PT Treatment/Exercise - 01/27/17 0001      Shoulder Exercises: Supine   Horizontal ABduction Strengthening;Both;10 reps;Theraband   Theraband Level (Shoulder Horizontal ABduction) Level 2 (Red)   External Rotation Strengthening;Both;10 reps;Theraband   Theraband Level (Shoulder External Rotation) Level 2 (Red)   Flexion Strengthening;Both;Theraband;10 reps  Narrow and Wide Grip   Theraband Level (Shoulder Flexion) Level 2 (Red)   Other Supine Exercises Bil D2 with red theraband 10 times each side      Shoulder Exercises: Pulleys   Flexion 2 minutes   ABduction 2 minutes     Shoulder Exercises: Therapy Ball   Flexion 10 reps  Forward lean into end of stretch   ABduction 10 reps  Rt UE with side lean into end of stretch, cues to engage cor     Manual Therapy   Manual Therapy Passive ROM;Soft tissue mobilization   Passive ROM PROM right shoulder in supine to pt tolerance all planes focused on abduction and flexion                        Long Term Clinic Goals - 01/27/17 0957      CC Long Term Goal  #1   Title Patient will verbalize understanding of risk reduction practices for lymphedema.   Time 4   Period Weeks   Status Achieved     CC Long Term Goal  #2   Title Improve DASH score to be </= 15 for improved overall arm function.   Baseline 01/27/17- 18% impairment   Time 4   Period Weeks   Status On-going      CC Long Term Goal  #3   Title Increase right shoulder flexion to >/= 155 degrees for increased ease reaching overhead.   Baseline 01/27/17- 167 degrees   Time 4   Period Weeks   Status Achieved     CC Long Term Goal  #4   Title Increase right shoulder abduction to >/= 155 degrees for increased ease reaching overhead and out to the side.   Baseline 01/27/17- 158  degrees   Time 4   Period Weeks   Status Achieved     CC Long Term Goal  #5   Title Independent with a home exercise program for shoulder ROM and verbalize good understanding of importance of a walking program for reducing recurrance risk.   Baseline 01/27/17- contining to progress pt's HEP   Time 4   Period Weeks   Status On-going            Plan - 01/27/17 5916    Clinical Impression Statement Continued instructing patient in supine scapular series with red theraband. Patient has increased fatigue and general pain today because she has been moving. She states she has not been moivng anything heavy but has been going up and down the stairs frequently. Patient states her wound has almost closed. Pt has met all range of motion goals for therapy but would benefit from further progression of her home exercise program.    Rehab Potential Excellent   Clinical Impairments Affecting Rehab Potential Breast wound   PT Frequency 2x / week   PT Duration 4 weeks   PT Treatment/Interventions ADLs/Self Care Home Management;Manual techniques;Manual lymph drainage;Scar mobilization;Passive range of motion;Patient/family education   PT Next Visit Plan  continue with shoulder ROM exercises and PROM as tolerated with monitoring wound. Review HEP issued today.    PT Home Exercise Plan Supine scapular series with red (or yellow for more difficult exercises) theraband; cont ROM HEP and stretching throughout day   Consulted and Agree with Plan of Care Patient      Patient will benefit from skilled therapeutic intervention in order to improve the  following deficits and impairments:  Postural dysfunction, Decreased range of motion, Impaired UE functional use, Pain, Decreased knowledge of precautions  Visit Diagnosis: Stiffness of right shoulder, not elsewhere classified - Plan: PT plan of care cert/re-cert  Aftercare following surgery for neoplasm - Plan: PT plan of care cert/re-cert  Abnormal posture - Plan: PT plan of care cert/re-cert     Problem List Patient Active Problem List   Diagnosis Date Noted  . Breast cancer, right ([place]) 10/28/2016  . Genetic testing 10/01/2016  . Neck pain 12/02/2015  . Preoperative clearance 11/10/2015  . Peripheral edema 10/05/2015  . Cervical pain (neck) 10/05/2015  . S/P left THA, AA 06/23/2015  . Obese 05/13/2015  . Avascular necrosis of bones of both hips ([place] [place]) 04/05/2015  . Essential hypertension 02/27/2015  . Muscle spasm 02/27/2015  . Pain in joint, shoulder region 09/05/2014  . Visit for preventive health examination 12/12/2013  . Leg swelling 12/08/2013  . Other abnormal glucose 02/23/2012  . Nonspecific abnormal electrocardiogram (ECG) (EKG) 02/23/2012  . GERD 10/23/2009  . ANXIETY STATE, UNSPECIFIED 09/02/2009  . DEPRESSION 07/10/2008  . BREAST CANCER, HX OF 07/10/2008  . COLONIC POLYPS, HX OF 07/10/2008  . FIBROIDS, UTERUS 02/15/2008  . MIGRAINE HEADACHE 02/15/2008  . DILATION AND CURETTAGE, HX OF 02/15/2008  . Unspecified essential hypertension 09/25/2007    Allyson Sabal [facility] 01/27/2017, 10:40 AM  [place], [place], [address] Phone: [phone]   Fax:  [phone]  Name: Madeline Chandler MRN: [id number] Date of Birth: 11-18-1960  Manus Gunning, PT 01/27/17 10:40 AM

## 2017-01-30 ENCOUNTER — Ambulatory Visit: Payer: 59

## 2017-01-30 DIAGNOSIS — M25611 Stiffness of right shoulder, not elsewhere classified: Secondary | ICD-10-CM

## 2017-01-30 DIAGNOSIS — Z483 Aftercare following surgery for neoplasm: Secondary | ICD-10-CM

## 2017-01-30 DIAGNOSIS — R293 Abnormal posture: Secondary | ICD-10-CM

## 2017-01-30 NOTE — Therapy (Signed)
[place], [place], [address] Phone: [phone]   Fax:  [phone]  Physical Therapy Treatment  Patient Details  Name: Madeline Chandler MRN: [id number] Date of Birth: 26-May-1961 Referring Provider: Dr. Irene Limbo  Encounter Date: 01/30/2017      PT End of Session - 01/30/17 0936    Visit Number 8   Number of Visits 20   Date for PT Re-Evaluation 02/24/17   PT Start Time 0853   PT Stop Time 0937   PT Time Calculation (min) 44 min   Activity Tolerance Patient tolerated treatment well   Behavior During Therapy [facility] for tasks assessed/performed      Past Medical History:  Diagnosis Date  . Anxiety   . Arthritis    "hips" (10/28/2016)  . Breast cancer, right breast ([place]) 1997; 2018   S/P chemo & radiation;   . Bulging lumbar disc    2  . GERD (gastroesophageal reflux disease)   . History of colonic polyps    Dr Collene Mares  . Hypertension   . Migraine    "none in the last few years; had them mostly in late 20's/mid 30's" (10/28/2016)  . Pre-diabetes   . Restless leg syndrome     Past Surgical History:  Procedure Laterality Date  . ABDOMINAL HYSTERECTOMY  2000s  . ANTERIOR CERVICAL DECOMP/DISCECTOMY FUSION N/A 12/02/2015   Procedure: ANTERIOR CERVICAL DECOMPRESSION/DISCECTOMY FUSION C4 - C7  3 LEVELS;  Surgeon: Melina Schools, MD;  Location: [place];  Service: Orthopedics;  Laterality: N/A;  . BACK SURGERY    . BREAST BIOPSY Right 1997; 08/2016  . BREAST LUMPECTOMY Right 1997  . COLONOSCOPY W/ POLYPECTOMY  2010   Dr Collene Mares  . DILATION AND CURETTAGE OF UTERUS  1990s  . JOINT REPLACEMENT    . LATISSIMUS FLAP TO BREAST Right 10/28/2016   Procedure: LATISSIMUS FLAP TO BREAST WITH PLACEMENT OF TISSUE EXPANDER ON THE RIGHT;  Surgeon: Irene Limbo, MD;  Location: [place];  Service: Plastics;  Laterality: Right;  RFNA  . MASTECTOMY Right 10/28/2016  . RECONSTRUCTION BREAST W/ LATISSIMUS DORSI FLAP  Right 10/28/2016    WITH PLACEMENT OF TISSUE EXPANDER   . TISSUE EXPANDER PLACEMENT Right 10/28/2016   Procedure: TISSUE EXPANDER;  Surgeon: Irene Limbo, MD;  Location: [place];  Service: Plastics;  Laterality: Right;  . TOTAL HIP ARTHROPLASTY Right 05/12/2015   Procedure: RIGHT TOTAL HIP ARTHROPLASTY ANTERIOR APPROACH;  Surgeon: Paralee Cancel, MD;  [facility];  Service: Orthopedics;  Laterality: Right;  . TOTAL HIP ARTHROPLASTY Left 06/23/2015   Procedure: LEFT TOTAL HIP ARTHROPLASTY ANTERIOR APPROACH;  Surgeon: Paralee Cancel, MD;  [facility];  Service: Orthopedics;  Laterality: Left;  . TOTAL MASTECTOMY Right 10/28/2016   Procedure: RIGHT MASTECTOMY;  Surgeon: Alphonsa Overall, MD;  Location: [place];  Service: General;  Laterality: Right;    There were no vitals filed for this visit.      Subjective Assessment - 01/30/17 0855    Subjective I finished moving this weekend. My Rt shoulder isn't hurting more, just feels tired and a little weak. My LBP isn't hurting today, just gets achy at times.    Pertinent History Right lumpectomy and axillary node dissection 1998 (approximate date) followed by chemo and radiation. Right mastectomy with reconstruction 10/28/16. No chemo or radiation needed. Bilateral hips replaced 2016.  Cervical fusion (Dr. Rolena Infante) 4/17.   Patient Stated Goals Wash back and reach overhead easier   Currently in Pain?  No/denies            [facility] PT Assessment - 01/30/17 0001      AROM   Right Shoulder Flexion 163 Degrees   Right Shoulder ABduction 164 Degrees                     [facility] Adult PT Treatment/Exercise - 01/30/17 0001      Shoulder Exercises: Standing   Other Standing Exercises 3 way raises with head, shoulders and abck agaisnt the wall and core engaged for flexion, scaption, and abduction to shoulder height with 2 lbs for flexion and 1 lb for last 2, 10 times each     Shoulder Exercises: Pulleys   Flexion 2 minutes   ABduction 2  minutes   ABduction Limitations VC to hold stretch     Shoulder Exercises: Therapy Ball   Flexion 10 reps  With forward lean into end of stretch   ABduction 10 reps  Rt UE with side lean into end of stretch                PT Education - 01/30/17 0911    Education provided Yes   Education Details Standing 3 way raises with 1 lb   Person(s) Educated Patient   Methods Explanation;Demonstration;Handout;Verbal cues   Comprehension Verbalized understanding;Returned demonstration;Verbal cues required                [facility] Goals - 01/27/17 0957      CC Long Term Goal  #1   Title Patient will verbalize understanding of risk reduction practices for lymphedema.   Time 4   Period Weeks   Status Achieved     CC Long Term Goal  #2   Title Improve DASH score to be </= 15 for improved overall arm function.   Baseline 01/27/17- 18% impairment   Time 4   Period Weeks   Status On-going     CC Long Term Goal  #3   Title Increase right shoulder flexion to >/= 155 degrees for increased ease reaching overhead.   Baseline 01/27/17- 167 degrees   Time 4   Period Weeks   Status Achieved     CC Long Term Goal  #4   Title Increase right shoulder abduction to >/= 155 degrees for increased ease reaching overhead and out to the side.   Baseline 01/27/17- 158 degrees   Time 4   Period Weeks   Status Achieved     CC Long Term Goal  #5   Title Independent with a home exercise program for shoulder ROM and verbalize good understanding of importance of a walking program for reducing recurrance risk.   Baseline 01/27/17- contining to progress pt's HEP   Time 4   Period Weeks   Status On-going            Plan - 01/30/17 1102    Clinical Impression Statement Pt tolerated session very well and is doing excellent with her progress. She mas met both A/ROM goals at this time and is independenet with HEP issued thus far. Progressed her HEP to include strengthening she can do  in standing but not to do all HEP strength on same day. She verbalized understanding this. Pt may be rady for D/C at next visit.    Rehab Potential Excellent   Clinical Impairments Affecting Rehab Potential Breast wound   PT Frequency 2x / week   PT Duration 4 weeks   PT Treatment/Interventions ADLs/Self  Care Home Management;Manual techniques;Manual lymph drainage;Scar mobilization;Passive range of motion;Patient/family education   PT Next Visit Plan See what Dr. Iran Planas said. Have pt retake DASH for goal assess. Possible D/C at next visit as pt has met goals.    Consulted and Agree with Plan of Care Patient      Patient will benefit from skilled therapeutic intervention in order to improve the following deficits and impairments:  Postural dysfunction, Decreased range of motion, Impaired UE functional use, Pain, Decreased knowledge of precautions  Visit Diagnosis: Stiffness of right shoulder, not elsewhere classified  Aftercare following surgery for neoplasm  Abnormal posture     Problem List Patient Active Problem List   Diagnosis Date Noted  . Breast cancer, right ([place]) 10/28/2016  . Genetic testing 10/01/2016  . Neck pain 12/02/2015  . Preoperative clearance 11/10/2015  . Peripheral edema 10/05/2015  . Cervical pain (neck) 10/05/2015  . S/P left THA, AA 06/23/2015  . Obese 05/13/2015  . Avascular necrosis of bones of both hips ([place]) 04/05/2015  . Essential hypertension 02/27/2015  . Muscle spasm 02/27/2015  . Pain in joint, shoulder region 09/05/2014  . Visit for preventive health examination 12/12/2013  . Leg swelling 12/08/2013  . Other abnormal glucose 02/23/2012  . Nonspecific abnormal electrocardiogram (ECG) (EKG) 02/23/2012  . GERD 10/23/2009  . ANXIETY STATE, UNSPECIFIED 09/02/2009  . DEPRESSION 07/10/2008  . BREAST CANCER, HX OF 07/10/2008  . COLONIC POLYPS, HX OF 07/10/2008  . FIBROIDS, UTERUS 02/15/2008  . MIGRAINE HEADACHE 02/15/2008  . DILATION AND  CURETTAGE, HX OF 02/15/2008  . Unspecified essential hypertension 09/25/2007    Otelia Limes, PTA 01/30/2017, 9:41 AM  [place], [place], [address] Phone: [phone]   Fax:  [phone]  Name: Madeline Chandler MRN: [id number] Date of Birth: [birth year]

## 2017-01-30 NOTE — Patient Instructions (Signed)

## 2017-02-02 ENCOUNTER — Encounter: Payer: 59 | Admitting: Physical Therapy

## 2017-02-07 ENCOUNTER — Telehealth: Payer: Self-pay | Admitting: Physical Therapy

## 2017-02-07 ENCOUNTER — Ambulatory Visit: Payer: 59 | Attending: Plastic Surgery | Admitting: Physical Therapy

## 2017-02-07 DIAGNOSIS — R293 Abnormal posture: Secondary | ICD-10-CM | POA: Insufficient documentation

## 2017-02-07 DIAGNOSIS — M25611 Stiffness of right shoulder, not elsewhere classified: Secondary | ICD-10-CM | POA: Insufficient documentation

## 2017-02-07 NOTE — Telephone Encounter (Signed)
Pt did not show for her 8 am appointment. Called pt to check on her. She states she forgot her appointment. She will attend her appointment on July 5th.  [employer], PT 02/07/17 8:30 AM

## 2017-02-09 ENCOUNTER — Ambulatory Visit: Payer: 59 | Admitting: Physical Therapy

## 2017-02-09 ENCOUNTER — Encounter: Payer: Self-pay | Admitting: Physical Therapy

## 2017-02-09 DIAGNOSIS — R293 Abnormal posture: Secondary | ICD-10-CM

## 2017-02-09 DIAGNOSIS — M25611 Stiffness of right shoulder, not elsewhere classified: Secondary | ICD-10-CM | POA: Diagnosis not present

## 2017-02-09 NOTE — Therapy (Signed)
[place], [place], [address] Phone: [phone]   Fax:  [phone]  Physical Therapy Treatment  Patient Details  Name: Madeline Chandler MRN: [id number] Date of Birth: [birth year] Referring Provider: Dr. Irene Limbo  Encounter Date: 02/09/2017      PT End of Session - 02/09/17 1648    Visit Number 9   Number of Visits 20   Date for PT Re-Evaluation 02/24/17   PT Start Time 1416  pt arrived late   PT Stop Time 1445   PT Time Calculation (min) 29 min   Activity Tolerance Patient tolerated treatment well   Behavior During Therapy [facility] for tasks assessed/performed      Past Medical History:  Diagnosis Date  . Anxiety   . Arthritis    "hips" (10/28/2016)  . Breast cancer, right breast ([place]) 1997; 2018   S/P chemo & radiation;   . Bulging lumbar disc    2  . GERD (gastroesophageal reflux disease)   . History of colonic polyps    Dr Collene Mares  . Hypertension   . Migraine    "none in the last few years; had them mostly in late 20's/mid 30's" (10/28/2016)  . Pre-diabetes   . Restless leg syndrome     Past Surgical History:  Procedure Laterality Date  . ABDOMINAL HYSTERECTOMY  2000s  . ANTERIOR CERVICAL DECOMP/DISCECTOMY FUSION N/A 12/02/2015   Procedure: ANTERIOR CERVICAL DECOMPRESSION/DISCECTOMY FUSION C4 - C7  3 LEVELS;  Surgeon: Melina Schools, MD;  Location: [place];  Service: Orthopedics;  Laterality: N/A;  . BACK SURGERY    . BREAST BIOPSY Right 1997; 08/2016  . BREAST LUMPECTOMY Right 1997  . COLONOSCOPY W/ POLYPECTOMY  2010   Dr Collene Mares  . DILATION AND CURETTAGE OF UTERUS  1990s  . JOINT REPLACEMENT    . LATISSIMUS FLAP TO BREAST Right 10/28/2016   Procedure: LATISSIMUS FLAP TO BREAST WITH PLACEMENT OF TISSUE EXPANDER ON THE RIGHT;  Surgeon: Irene Limbo, MD;  Location: [place];  Service: Plastics;  Laterality: Right;  RFNA  . MASTECTOMY Right 10/28/2016  . RECONSTRUCTION BREAST W/  LATISSIMUS DORSI FLAP Right 10/28/2016    WITH PLACEMENT OF TISSUE EXPANDER   . TISSUE EXPANDER PLACEMENT Right 10/28/2016   Procedure: TISSUE EXPANDER;  Surgeon: Irene Limbo, MD;  Location: [place];  Service: Plastics;  Laterality: Right;  . TOTAL HIP ARTHROPLASTY Right 05/12/2015   Procedure: RIGHT TOTAL HIP ARTHROPLASTY ANTERIOR APPROACH;  Surgeon: Paralee Cancel, MD;  [facility];  Service: Orthopedics;  Laterality: Right;  . TOTAL HIP ARTHROPLASTY Left 06/23/2015   Procedure: LEFT TOTAL HIP ARTHROPLASTY ANTERIOR APPROACH;  Surgeon: Paralee Cancel, MD;  [facility];  Service: Orthopedics;  Laterality: Left;  . TOTAL MASTECTOMY Right 10/28/2016   Procedure: RIGHT MASTECTOMY;  Surgeon: Alphonsa Overall, MD;  Location: [place];  Service: General;  Laterality: Right;    There were no vitals filed for this visit.      Subjective Assessment - 02/09/17 1616    Subjective Dr. Iran Planas said she would contact the insurance company to get approval for my surgery. I am going to get the implant on one side and the reduction on the other.    Pertinent History Right lumpectomy and axillary node dissection 1998 (approximate date) followed by chemo and radiation. Right mastectomy with reconstruction 10/28/16. No chemo or radiation needed. Bilateral hips replaced 2016.  Cervical fusion (Dr. Rolena Infante) 4/17.   Patient Stated Goals Wash back and reach  overhead easier   Currently in Pain? No/denies   Pain Score 0-No pain                  Quick Dash - 02/09/17 0001    Open a tight or new jar No difficulty   Do heavy household chores (wash walls, wash floors) No difficulty   Carry a shopping bag or briefcase No difficulty   Wash your back Mild difficulty   Use a knife to cut food No difficulty   Recreational activities in which you take some force or impact through your arm, shoulder, or hand (golf, hammering, tennis) No difficulty   During the past week, to what extent has your arm, shoulder or  hand problem interfered with your normal social activities with family, friends, neighbors, or groups? Not at all   During the past week, to what extent has your arm, shoulder or hand problem limited your work or other regular daily activities Not at all   Arm, shoulder, or hand pain. None   Tingling (pins and needles) in your arm, shoulder, or hand None   Difficulty Sleeping No difficulty   DASH Score 2.27 %               [facility] Adult PT Treatment/Exercise - 02/09/17 0001      Shoulder Exercises: Supine   Horizontal ABduction Strengthening;Both;10 reps;Theraband   Theraband Level (Shoulder Horizontal ABduction) Level 2 (Red)   External Rotation Strengthening;Both;10 reps;Theraband   Theraband Level (Shoulder External Rotation) Level 2 (Red)   Flexion Strengthening;Both;Theraband;10 reps  Narrow and Wide Grip   Theraband Level (Shoulder Flexion) Level 2 (Red)   Other Supine Exercises Bil D2 with red theraband 10 times each side      Shoulder Exercises: Standing   Other Standing Exercises 3 way raises with head, shoulders and back agaisnt the wall and core engaged for flexion, scaption, and abduction to shoulder height with 2 lbs for flexion and 1 lb for last 2, 10 times each     Shoulder Exercises: Pulleys   Flexion 2 minutes   ABduction 2 minutes   ABduction Limitations VC to hold stretch and keep shoulder from elevating     Shoulder Exercises: Therapy Ball   Flexion 10 reps  With forward lean into end of stretch   ABduction 10 reps  Rt UE with side lean into end of stretch     Manual Therapy   Passive ROM brief PROM right shoulder in supine to pt tolerance all planes focused on abduction and flexion - stopped because pt has full ROM                        [facility] Goals - 02/09/17 1619      CC Long Term Goal  #1   Title Patient will verbalize understanding of risk reduction practices for lymphedema.   Time 4   Period Weeks   Status Achieved      CC Long Term Goal  #2   Title Improve DASH score to be </= 15 for improved overall arm function.   Baseline 01/27/17- 18% impairment, 02/09/17- 2%   Time 4   Period Weeks   Status Achieved     CC Long Term Goal  #3   Title Increase right shoulder flexion to >/= 155 degrees for increased ease reaching overhead.   Baseline 01/27/17- 167 degrees   Time 4   Period Weeks   Status Achieved  CC Long Term Goal  #4   Title Increase right shoulder abduction to >/= 155 degrees for increased ease reaching overhead and out to the side.   Baseline 01/27/17- 158 degrees   Time 4   Period Weeks   Status Achieved     CC Long Term Goal  #5   Title Independent with a home exercise program for shoulder ROM and verbalize good understanding of importance of a walking program for reducing recurrance risk.   Baseline 01/27/17- contining to progress pt's HEP   Time 4   Period Weeks   Status Achieved            Plan - 02/09/17 1648    Clinical Impression Statement Pt is ready for d/c today. Assessed pt's progress towards all goals in therapy. She has now met all goals for therapy. She demonstrates full R should ROM. She is now independent in a home exercise program for continued strengthening and stretching and her Dash score was only 2% impairment.    Rehab Potential Excellent   Clinical Impairments Affecting Rehab Potential Breast wound   PT Frequency 2x / week   PT Duration 4 weeks   PT Treatment/Interventions ADLs/Self Care Home Management;Manual techniques;Manual lymph drainage;Scar mobilization;Passive range of motion;Patient/family education   PT Next Visit Plan dc this visit   PT Home Exercise Plan Supine scapular series with red (or yellow for more difficult exercises) theraband; cont ROM HEP and stretching throughout day   Consulted and Agree with Plan of Care Patient      Patient will benefit from skilled therapeutic intervention in order to improve the following deficits and  impairments:  Postural dysfunction, Decreased range of motion, Impaired UE functional use, Pain, Decreased knowledge of precautions  Visit Diagnosis: Stiffness of right shoulder, not elsewhere classified  Abnormal posture     Problem List Patient Active Problem List   Diagnosis Date Noted  . Breast cancer, right ([place]) 10/28/2016  . Genetic testing 10/01/2016  . Neck pain 12/02/2015  . Preoperative clearance 11/10/2015  . Peripheral edema 10/05/2015  . Cervical pain (neck) 10/05/2015  . S/P left THA, AA 06/23/2015  . Obese 05/13/2015  . Avascular necrosis of bones of both hips ([place]) 04/05/2015  . Essential hypertension 02/27/2015  . Muscle spasm 02/27/2015  . Pain in joint, shoulder region 09/05/2014  . Visit for preventive health examination 12/12/2013  . Leg swelling 12/08/2013  . Other abnormal glucose 02/23/2012  . Nonspecific abnormal electrocardiogram (ECG) (EKG) 02/23/2012  . GERD 10/23/2009  . ANXIETY STATE, UNSPECIFIED 09/02/2009  . DEPRESSION 07/10/2008  . BREAST CANCER, HX OF 07/10/2008  . COLONIC POLYPS, HX OF 07/10/2008  . FIBROIDS, UTERUS 02/15/2008  . MIGRAINE HEADACHE 02/15/2008  . DILATION AND CURETTAGE, HX OF 02/15/2008  . Unspecified essential hypertension 09/25/2007    Allyson Sabal [facility] 02/09/2017, 4:50 PM  [place], [place], [address] Phone: [phone]   Fax:  [phone]  Name: Madeline Chandler MRN: [id number] Date of Birth: 1961/05/18  PHYSICAL THERAPY DISCHARGE SUMMARY  Visits from Start of Care: 9  Current functional level related to goals / functional outcomes: Pt is independent in HEP and has full R shoulder ROM   Remaining deficits: none   Education / Equipment: HEP Plan: Patient agrees to discharge.  Patient goals were met. Patient is being discharged due to meeting the stated rehab goals.  ?????    Allyson Sabal [place], [place] 02/09/17 4:51  PM

## 2017-02-14 ENCOUNTER — Ambulatory Visit: Payer: 59 | Admitting: Physical Therapy

## 2017-02-14 ENCOUNTER — Ambulatory Visit: Payer: 59

## 2017-02-15 ENCOUNTER — Telehealth: Payer: Self-pay

## 2017-02-15 NOTE — Telephone Encounter (Signed)
Called pt with no answer.

## 2017-02-16 ENCOUNTER — Encounter: Payer: 59 | Admitting: Physical Therapy

## 2017-02-16 ENCOUNTER — Ambulatory Visit: Payer: 59 | Admitting: Oncology

## 2017-02-16 ENCOUNTER — Other Ambulatory Visit: Payer: Self-pay | Admitting: *Deleted

## 2017-02-16 DIAGNOSIS — Z170 Estrogen receptor positive status [ER+]: Secondary | ICD-10-CM

## 2017-02-16 DIAGNOSIS — C50211 Malignant neoplasm of upper-inner quadrant of right female breast: Secondary | ICD-10-CM

## 2017-02-16 MED ORDER — LETROZOLE 2.5 MG PO TABS: 2.5000 mg | ORAL_TABLET | Freq: Every day | ORAL | 3 refills | Status: DC

## 2017-02-16 NOTE — Telephone Encounter (Signed)
Message from pt requesting refill to be sent to [facility]. Same done, per order of Dr. Benay Spice.

## 2017-02-20 ENCOUNTER — Encounter: Payer: 59 | Admitting: Physical Therapy

## 2017-02-22 ENCOUNTER — Encounter: Payer: 59 | Admitting: Physical Therapy

## 2017-02-22 NOTE — H&P (Signed)
  Subjective:     Patient ID: Madeline Chandler is a 56 y.o. female.  Follow-up    3.5 months post op right mastectomy with immediate LD/TE reconstruction. 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 This has now healed and plan removal right TE, placement implant, left reduction for symmetry.    PMH significant for history right breast ca diagnosed 1997, treated with lumpectomy and adjuvant chemotherapy and radiation, 5 years tamoxifen. Routine follow up imaging 08/2016 with right breast distortion. MMG/US with area of concern right breast 1 o clock. 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 Final pathology with intermediate grade DCIS, margins clear. Prior ALND and no lymph tissue noted in specimen labeled right axillary contents.  Genetics negative. On Femara. Next f/u Dr. Benay Spice 7/18  Prior to lumpectomy B cup, current D cup on left. Right mastectomy 547 g Last imaging on left 1/18.  Review of Systems     Objective:   Physical Exam  Cardiovascular: Normal rate, regular rhythm and normal heart sounds.   Pulmonary/Chest: Effort normal and breath sounds normal.  Abdominal: Soft.   Chest:   Area of medial inset LD skin paddle and UIQ mastectomy flap that had mastectomy flap necrosis has all healed, hypopigmentation Left grade 3 ptosis SN to nipple L 34 cm BW R 15 cm L 23 cm Nipple to IMF L 15 cm     Assessment:     DCIS right breast, microscopic focus IDC UIQ ER/PR+, Her2+ History right breast ca, s/p lumpectomy, XRT S/p right mastectomy, LD/TE reconstruction S/p debridement right mastectomy flap    Plan:   Plan removal right TE and placement implant, left breast reduction. Plan observation stay.  Reviewed asymmetry with unilateral implant reconstruction including goal should be symmetric volume in bra and symmetric cleavage. Without bra, native breast will  have more ptosis and leff upper pole fullness. Discussed drain on left, anchor type scars, risk nipple necrosis or wound healing problems. Discussed saline vs silicone, round vs shaped. I recommend round as I do not feel she have sufficient projection with anatomic implant. We reviewed risks rupture, rippling, contracture. Discussed saline does not require surveillance for rupture but may have more risk rippling. She has elected for saline, plan smooth round.   Rx for Bactrim, Norco given.  Natrelle [id number]-13-T 500 ml tissue expander placed,  565  ml total fill volume    Irene Limbo, MD [facility] [phone], pin [phone]

## 2017-02-27 ENCOUNTER — Encounter: Payer: 59 | Admitting: Physical Therapy

## 2017-02-28 ENCOUNTER — Encounter (HOSPITAL_BASED_OUTPATIENT_CLINIC_OR_DEPARTMENT_OTHER): Payer: Self-pay | Admitting: *Deleted

## 2017-03-06 ENCOUNTER — Encounter (HOSPITAL_BASED_OUTPATIENT_CLINIC_OR_DEPARTMENT_OTHER)
Admission: RE | Admit: 2017-03-06 | Discharge: 2017-03-06 | Disposition: A | Discharge status: Home / Self Care | Payer: 59 | Source: Ambulatory Visit | Attending: Plastic Surgery | Admitting: Plastic Surgery

## 2017-03-06 DIAGNOSIS — N62 Hypertrophy of breast: Secondary | ICD-10-CM | POA: Diagnosis present

## 2017-03-06 DIAGNOSIS — Z9011 Acquired absence of right breast and nipple: Secondary | ICD-10-CM | POA: Diagnosis not present

## 2017-03-06 DIAGNOSIS — I10 Essential (primary) hypertension: Secondary | ICD-10-CM | POA: Diagnosis not present

## 2017-03-06 DIAGNOSIS — Z853 Personal history of malignant neoplasm of breast: Secondary | ICD-10-CM | POA: Diagnosis present

## 2017-03-06 DIAGNOSIS — F419 Anxiety disorder, unspecified: Secondary | ICD-10-CM | POA: Diagnosis not present

## 2017-03-06 DIAGNOSIS — R51 Headache: Secondary | ICD-10-CM | POA: Diagnosis not present

## 2017-03-06 DIAGNOSIS — N651 Disproportion of reconstructed breast: Secondary | ICD-10-CM | POA: Diagnosis not present

## 2017-03-06 DIAGNOSIS — Z79811 Long term (current) use of aromatase inhibitors: Secondary | ICD-10-CM | POA: Diagnosis not present

## 2017-03-06 DIAGNOSIS — F329 Major depressive disorder, single episode, unspecified: Secondary | ICD-10-CM | POA: Diagnosis not present

## 2017-03-06 DIAGNOSIS — Z7951 Long term (current) use of inhaled steroids: Secondary | ICD-10-CM | POA: Diagnosis not present

## 2017-03-06 DIAGNOSIS — Z79899 Other long term (current) drug therapy: Secondary | ICD-10-CM | POA: Diagnosis not present

## 2017-03-06 DIAGNOSIS — Z923 Personal history of irradiation: Secondary | ICD-10-CM | POA: Diagnosis not present

## 2017-03-06 DIAGNOSIS — D242 Benign neoplasm of left breast: Secondary | ICD-10-CM | POA: Diagnosis not present

## 2017-03-06 DIAGNOSIS — N6012 Diffuse cystic mastopathy of left breast: Secondary | ICD-10-CM | POA: Diagnosis not present

## 2017-03-06 DIAGNOSIS — Z170 Estrogen receptor positive status [ER+]: Secondary | ICD-10-CM | POA: Diagnosis not present

## 2017-03-06 DIAGNOSIS — K219 Gastro-esophageal reflux disease without esophagitis: Secondary | ICD-10-CM | POA: Diagnosis not present

## 2017-03-06 DIAGNOSIS — D0511 Intraductal carcinoma in situ of right breast: Secondary | ICD-10-CM | POA: Diagnosis not present

## 2017-03-06 DIAGNOSIS — Z6841 Body Mass Index (BMI) 40.0 and over, adult: Secondary | ICD-10-CM | POA: Diagnosis not present

## 2017-03-06 DIAGNOSIS — M1990 Unspecified osteoarthritis, unspecified site: Secondary | ICD-10-CM | POA: Diagnosis not present

## 2017-03-06 LAB — BASIC METABOLIC PANEL
ANION GAP: 7 (ref 5–15)
BUN: 9 mg/dL (ref 6–20)
CALCIUM: 9.8 mg/dL (ref 8.9–10.3)
CO2: 30 mmol/L (ref 22–32)
Chloride: 101 mmol/L (ref 101–111)
Creatinine, Ser: 0.94 mg/dL (ref 0.44–1.00)
Glucose, Bld: 116 mg/dL — ABNORMAL HIGH (ref 65–99)
Potassium: 3.8 mmol/L (ref 3.5–5.1)
Sodium: 138 mmol/L (ref 135–145)

## 2017-03-06 NOTE — Progress Notes (Signed)
Boost drink given with instructions to complete by [place], hibiclens soap given with instructions, pt verbalized understanding. Labs drawn, EKG completed.

## 2017-03-07 ENCOUNTER — Encounter (HOSPITAL_BASED_OUTPATIENT_CLINIC_OR_DEPARTMENT_OTHER): Payer: Self-pay | Admitting: *Deleted

## 2017-03-07 ENCOUNTER — Encounter (HOSPITAL_BASED_OUTPATIENT_CLINIC_OR_DEPARTMENT_OTHER)
Admission: RE | Disposition: A | Discharge status: Home / Self Care | Payer: Self-pay | Source: Ambulatory Visit | Attending: Plastic Surgery

## 2017-03-07 ENCOUNTER — Ambulatory Visit (HOSPITAL_BASED_OUTPATIENT_CLINIC_OR_DEPARTMENT_OTHER): Payer: 59 | Admitting: Anesthesiology

## 2017-03-07 ENCOUNTER — Ambulatory Visit (HOSPITAL_BASED_OUTPATIENT_CLINIC_OR_DEPARTMENT_OTHER)
Admission: RE | Admit: 2017-03-07 | Discharge: 2017-03-08 | Disposition: A | Discharge status: Home / Self Care | Payer: 59 | Source: Ambulatory Visit | Attending: Plastic Surgery | Admitting: Plastic Surgery

## 2017-03-07 DIAGNOSIS — Z923 Personal history of irradiation: Secondary | ICD-10-CM | POA: Insufficient documentation

## 2017-03-07 DIAGNOSIS — Z9011 Acquired absence of right breast and nipple: Secondary | ICD-10-CM | POA: Insufficient documentation

## 2017-03-07 DIAGNOSIS — D242 Benign neoplasm of left breast: Secondary | ICD-10-CM | POA: Insufficient documentation

## 2017-03-07 DIAGNOSIS — K219 Gastro-esophageal reflux disease without esophagitis: Secondary | ICD-10-CM | POA: Insufficient documentation

## 2017-03-07 DIAGNOSIS — Z6841 Body Mass Index (BMI) 40.0 and over, adult: Secondary | ICD-10-CM | POA: Insufficient documentation

## 2017-03-07 DIAGNOSIS — Z9010 Acquired absence of unspecified breast and nipple: Secondary | ICD-10-CM

## 2017-03-07 DIAGNOSIS — D0511 Intraductal carcinoma in situ of right breast: Secondary | ICD-10-CM | POA: Insufficient documentation

## 2017-03-07 DIAGNOSIS — N62 Hypertrophy of breast: Secondary | ICD-10-CM | POA: Insufficient documentation

## 2017-03-07 DIAGNOSIS — Z7951 Long term (current) use of inhaled steroids: Secondary | ICD-10-CM | POA: Insufficient documentation

## 2017-03-07 DIAGNOSIS — F419 Anxiety disorder, unspecified: Secondary | ICD-10-CM | POA: Insufficient documentation

## 2017-03-07 DIAGNOSIS — Z170 Estrogen receptor positive status [ER+]: Secondary | ICD-10-CM | POA: Insufficient documentation

## 2017-03-07 DIAGNOSIS — R51 Headache: Secondary | ICD-10-CM | POA: Insufficient documentation

## 2017-03-07 DIAGNOSIS — Z79899 Other long term (current) drug therapy: Secondary | ICD-10-CM | POA: Insufficient documentation

## 2017-03-07 DIAGNOSIS — Z853 Personal history of malignant neoplasm of breast: Secondary | ICD-10-CM | POA: Insufficient documentation

## 2017-03-07 DIAGNOSIS — Z79811 Long term (current) use of aromatase inhibitors: Secondary | ICD-10-CM | POA: Insufficient documentation

## 2017-03-07 DIAGNOSIS — F329 Major depressive disorder, single episode, unspecified: Secondary | ICD-10-CM | POA: Insufficient documentation

## 2017-03-07 DIAGNOSIS — M1990 Unspecified osteoarthritis, unspecified site: Secondary | ICD-10-CM | POA: Insufficient documentation

## 2017-03-07 DIAGNOSIS — N651 Disproportion of reconstructed breast: Secondary | ICD-10-CM | POA: Insufficient documentation

## 2017-03-07 DIAGNOSIS — I10 Essential (primary) hypertension: Secondary | ICD-10-CM | POA: Insufficient documentation

## 2017-03-07 DIAGNOSIS — N6012 Diffuse cystic mastopathy of left breast: Secondary | ICD-10-CM | POA: Insufficient documentation

## 2017-03-07 HISTORY — PX: REMOVAL OF BILATERAL TISSUE EXPANDERS WITH PLACEMENT OF BILATERAL BREAST IMPLANTS: SHX6431

## 2017-03-07 HISTORY — DX: Major depressive disorder, single episode, unspecified: F32.9

## 2017-03-07 HISTORY — PX: BREAST REDUCTION SURGERY: SHX8

## 2017-03-07 SURGERY — REMOVAL, TISSUE EXPANDER, BREAST, BILATERAL, WITH BILATERAL IMPLANT IMPLANT INSERTION
Anesthesia: General | Site: Breast | Laterality: Right

## 2017-03-07 MED ORDER — EPHEDRINE 5 MG/ML INJ
INTRAVENOUS | Status: AC
  Filled 2017-03-07: qty 10

## 2017-03-07 MED ORDER — SUFENTANIL CITRATE 50 MCG/ML IV SOLN
INTRAVENOUS | Status: DC | PRN
  Administered 2017-03-07: 30 ug via INTRAVENOUS
  Administered 2017-03-07: 5 ug via INTRAVENOUS

## 2017-03-07 MED ORDER — CITALOPRAM HYDROBROMIDE 40 MG PO TABS: 40.0000 mg | ORAL_TABLET | Freq: Every day | ORAL | Status: DC

## 2017-03-07 MED ORDER — SUCCINYLCHOLINE CHLORIDE 200 MG/10ML IV SOSY
PREFILLED_SYRINGE | INTRAVENOUS | Status: AC
  Filled 2017-03-07: qty 10

## 2017-03-07 MED ORDER — HYDROCODONE-ACETAMINOPHEN 5-325 MG PO TABS
1.0000 | ORAL_TABLET | ORAL | Status: DC | PRN
  Administered 2017-03-07 (×2): 2 via ORAL
  Filled 2017-03-07 (×2): qty 2

## 2017-03-07 MED ORDER — ACETAMINOPHEN 500 MG PO TABS
1000.0000 mg | ORAL_TABLET | ORAL | Status: AC
  Administered 2017-03-07: 1000 mg via ORAL

## 2017-03-07 MED ORDER — GABAPENTIN 300 MG PO CAPS
ORAL_CAPSULE | ORAL | Status: AC
  Filled 2017-03-07: qty 1

## 2017-03-07 MED ORDER — DEXAMETHASONE SODIUM PHOSPHATE 4 MG/ML IJ SOLN
INTRAMUSCULAR | Status: DC | PRN
  Administered 2017-03-07: 10 mg via INTRAVENOUS

## 2017-03-07 MED ORDER — KETOROLAC TROMETHAMINE 30 MG/ML IJ SOLN
30.0000 mg | Freq: Three times a day (TID) | INTRAMUSCULAR | Status: AC
  Administered 2017-03-07 – 2017-03-08 (×3): 30 mg via INTRAVENOUS
  Filled 2017-03-07 (×3): qty 1

## 2017-03-07 MED ORDER — SCOPOLAMINE 1 MG/3DAYS TD PT72: 1.0000 | MEDICATED_PATCH | Freq: Once | TRANSDERMAL | Status: DC | PRN

## 2017-03-07 MED ORDER — PROPOFOL 10 MG/ML IV BOLUS
INTRAVENOUS | Status: DC | PRN
  Administered 2017-03-07: 50 mg via INTRAVENOUS
  Administered 2017-03-07: 150 mg via INTRAVENOUS

## 2017-03-07 MED ORDER — CEFAZOLIN SODIUM-DEXTROSE 1-4 GM/50ML-% IV SOLN
1.0000 g | Freq: Three times a day (TID) | INTRAVENOUS | Status: AC
  Administered 2017-03-07 – 2017-03-08 (×3): 1 g via INTRAVENOUS
  Filled 2017-03-07 (×3): qty 50

## 2017-03-07 MED ORDER — SUFENTANIL CITRATE 50 MCG/ML IV SOLN
INTRAVENOUS | Status: AC
  Filled 2017-03-07: qty 1

## 2017-03-07 MED ORDER — ROCURONIUM BROMIDE 100 MG/10ML IV SOLN
INTRAVENOUS | Status: DC | PRN
  Administered 2017-03-07: 60 mg via INTRAVENOUS

## 2017-03-07 MED ORDER — ONDANSETRON HCL 4 MG/2ML IJ SOLN
INTRAMUSCULAR | Status: AC
  Filled 2017-03-07: qty 2

## 2017-03-07 MED ORDER — ONDANSETRON HCL 4 MG/2ML IJ SOLN: 4.0000 mg | Freq: Four times a day (QID) | INTRAMUSCULAR | Status: DC | PRN

## 2017-03-07 MED ORDER — ROCURONIUM BROMIDE 10 MG/ML (PF) SYRINGE
PREFILLED_SYRINGE | INTRAVENOUS | Status: AC
  Filled 2017-03-07: qty 5

## 2017-03-07 MED ORDER — PHENOL 1.4 % MT LIQD
1.0000 | OROMUCOSAL | Status: DC | PRN
  Administered 2017-03-07: 1 via OROMUCOSAL
  Filled 2017-03-07: qty 177

## 2017-03-07 MED ORDER — HYDROMORPHONE HCL 1 MG/ML IJ SOLN
INTRAMUSCULAR | Status: AC
  Filled 2017-03-07: qty 0.5

## 2017-03-07 MED ORDER — HYDROMORPHONE HCL 1 MG/ML IJ SOLN: 0.5000 mg | INTRAMUSCULAR | Status: DC | PRN

## 2017-03-07 MED ORDER — HYDROCHLOROTHIAZIDE 25 MG PO TABS: 25.0000 mg | ORAL_TABLET | Freq: Every day | ORAL | Status: DC

## 2017-03-07 MED ORDER — CELECOXIB 400 MG PO CAPS
400.0000 mg | ORAL_CAPSULE | ORAL | Status: AC
  Administered 2017-03-07: 400 mg via ORAL

## 2017-03-07 MED ORDER — KCL IN DEXTROSE-NACL 20-5-0.45 MEQ/L-%-% IV SOLN
INTRAVENOUS | Status: DC
  Administered 2017-03-07: 13:00:00 via INTRAVENOUS
  Filled 2017-03-07: qty 1000

## 2017-03-07 MED ORDER — FENTANYL CITRATE (PF) 100 MCG/2ML IJ SOLN: 50.0000 ug | INTRAMUSCULAR | Status: DC | PRN

## 2017-03-07 MED ORDER — PHENYLEPHRINE HCL 10 MG/ML IJ SOLN
INTRAVENOUS | Status: DC | PRN
  Administered 2017-03-07: 40 ug/min via INTRAVENOUS

## 2017-03-07 MED ORDER — LOSARTAN POTASSIUM 50 MG PO TABS: 100.0000 mg | ORAL_TABLET | Freq: Every day | ORAL | Status: DC

## 2017-03-07 MED ORDER — CHLORHEXIDINE GLUCONATE CLOTH 2 % EX PADS: 6.0000 | MEDICATED_PAD | Freq: Once | CUTANEOUS | Status: DC

## 2017-03-07 MED ORDER — CEFAZOLIN SODIUM-DEXTROSE 2-4 GM/100ML-% IV SOLN
2.0000 g | INTRAVENOUS | Status: AC
  Administered 2017-03-07: 2 g via INTRAVENOUS

## 2017-03-07 MED ORDER — GABAPENTIN 300 MG PO CAPS
300.0000 mg | ORAL_CAPSULE | ORAL | Status: AC
  Administered 2017-03-07: 300 mg via ORAL

## 2017-03-07 MED ORDER — ARTIFICIAL TEARS OPHTHALMIC OINT
TOPICAL_OINTMENT | OPHTHALMIC | Status: AC
  Filled 2017-03-07: qty 3.5

## 2017-03-07 MED ORDER — LIDOCAINE 2% (20 MG/ML) 5 ML SYRINGE
INTRAMUSCULAR | Status: AC
  Filled 2017-03-07: qty 5

## 2017-03-07 MED ORDER — PROPOFOL 500 MG/50ML IV EMUL
INTRAVENOUS | Status: AC
  Filled 2017-03-07: qty 50

## 2017-03-07 MED ORDER — GLYCOPYRROLATE 0.2 MG/ML IJ SOLN
INTRAMUSCULAR | Status: DC | PRN
  Administered 2017-03-07: 0.1 mg via INTRAVENOUS

## 2017-03-07 MED ORDER — BUPIVACAINE LIPOSOME 1.3 % IJ SUSP
INTRAMUSCULAR | Status: AC
  Filled 2017-03-07: qty 20

## 2017-03-07 MED ORDER — CARVEDILOL 25 MG PO TABS
25.0000 mg | ORAL_TABLET | Freq: Two times a day (BID) | ORAL | Status: DC
  Administered 2017-03-07: 25 mg via ORAL

## 2017-03-07 MED ORDER — PHENYLEPHRINE 40 MCG/ML (10ML) SYRINGE FOR IV PUSH (FOR BLOOD PRESSURE SUPPORT)
PREFILLED_SYRINGE | INTRAVENOUS | Status: AC
  Filled 2017-03-07: qty 10

## 2017-03-07 MED ORDER — HYDROMORPHONE HCL 1 MG/ML IJ SOLN
0.2500 mg | INTRAMUSCULAR | Status: DC | PRN
  Administered 2017-03-07: 0.5 mg via INTRAVENOUS

## 2017-03-07 MED ORDER — ACETAMINOPHEN 500 MG PO TABS
ORAL_TABLET | ORAL | Status: AC
Start: 2017-03-07 — End: 2017-03-07
  Filled 2017-03-07: qty 2

## 2017-03-07 MED ORDER — BUPIVACAINE LIPOSOME 1.3 % IJ SUSP
INTRAMUSCULAR | Status: DC | PRN
  Administered 2017-03-07: 18 mL

## 2017-03-07 MED ORDER — BETHANECHOL CHLORIDE 5 MG PO TABS
5.0000 mg | ORAL_TABLET | Freq: Four times a day (QID) | ORAL | Status: DC | PRN
  Administered 2017-03-07: 5 mg via ORAL
  Filled 2017-03-07: qty 1

## 2017-03-07 MED ORDER — CELECOXIB 200 MG PO CAPS
ORAL_CAPSULE | ORAL | Status: AC
  Filled 2017-03-07: qty 2

## 2017-03-07 MED ORDER — CEFAZOLIN SODIUM-DEXTROSE 2-4 GM/100ML-% IV SOLN
INTRAVENOUS | Status: AC
  Filled 2017-03-07: qty 100

## 2017-03-07 MED ORDER — PHENYLEPHRINE HCL 10 MG/ML IJ SOLN
INTRAMUSCULAR | Status: DC | PRN
  Administered 2017-03-07: 40 ug via INTRAVENOUS

## 2017-03-07 MED ORDER — MIDAZOLAM HCL 2 MG/2ML IJ SOLN
INTRAMUSCULAR | Status: AC
  Filled 2017-03-07: qty 2

## 2017-03-07 MED ORDER — SUGAMMADEX SODIUM 200 MG/2ML IV SOLN
INTRAVENOUS | Status: DC | PRN
  Administered 2017-03-07: 250 mg via INTRAVENOUS

## 2017-03-07 MED ORDER — SUGAMMADEX SODIUM 200 MG/2ML IV SOLN
INTRAVENOUS | Status: AC
  Filled 2017-03-07: qty 2

## 2017-03-07 MED ORDER — ONDANSETRON 4 MG PO TBDP
4.0000 mg | ORAL_TABLET | Freq: Four times a day (QID) | ORAL | Status: DC | PRN
Start: 2017-03-07 — End: 2017-03-08

## 2017-03-07 MED ORDER — EPHEDRINE SULFATE 50 MG/ML IJ SOLN
INTRAMUSCULAR | Status: DC | PRN
  Administered 2017-03-07 (×2): 10 mg via INTRAVENOUS

## 2017-03-07 MED ORDER — DIAZEPAM 2 MG PO TABS: 2.0000 mg | ORAL_TABLET | Freq: Three times a day (TID) | ORAL | Status: DC | PRN

## 2017-03-07 MED ORDER — SODIUM CHLORIDE 0.9 % IV SOLN
INTRAVENOUS | Status: DC | PRN
  Administered 2017-03-07: 1000 mL

## 2017-03-07 MED ORDER — MIDAZOLAM HCL 2 MG/2ML IJ SOLN
1.0000 mg | INTRAMUSCULAR | Status: DC | PRN
  Administered 2017-03-07: 2 mg via INTRAVENOUS

## 2017-03-07 MED ORDER — 0.9 % SODIUM CHLORIDE (POUR BTL) OPTIME
TOPICAL | Status: DC | PRN
  Administered 2017-03-07: 1000 mL

## 2017-03-07 MED ORDER — PANTOPRAZOLE SODIUM 40 MG PO TBEC: 40.0000 mg | DELAYED_RELEASE_TABLET | Freq: Every evening | ORAL | Status: DC

## 2017-03-07 MED ORDER — LACTATED RINGERS IV SOLN
INTRAVENOUS | Status: DC
  Administered 2017-03-07 (×3): via INTRAVENOUS

## 2017-03-07 MED ORDER — DEXAMETHASONE SODIUM PHOSPHATE 10 MG/ML IJ SOLN
INTRAMUSCULAR | Status: AC
  Filled 2017-03-07: qty 1

## 2017-03-07 MED ORDER — LIDOCAINE HCL (CARDIAC) 20 MG/ML IV SOLN
INTRAVENOUS | Status: DC | PRN
  Administered 2017-03-07: 50 mg via INTRAVENOUS

## 2017-03-07 SURGICAL SUPPLY — 57 items
BAG DECANTER FOR FLEXI CONT (MISCELLANEOUS) ×3 IMPLANT
BINDER BREAST XXLRG (GAUZE/BANDAGES/DRESSINGS) ×3 IMPLANT
BLADE SURG 10 STRL SS (BLADE) ×12 IMPLANT
BNDG GAUZE ELAST 4 BULKY (GAUZE/BANDAGES/DRESSINGS) ×6 IMPLANT
CANISTER SUCT 1200ML W/VALVE (MISCELLANEOUS) ×3 IMPLANT
CHLORAPREP W/TINT 26ML (MISCELLANEOUS) ×6 IMPLANT
COVER BACK TABLE 60X90IN (DRAPES) ×3 IMPLANT
COVER MAYO STAND STRL (DRAPES) ×3 IMPLANT
DERMABOND ADVANCED (GAUZE/BANDAGES/DRESSINGS) ×2
DERMABOND ADVANCED .7 DNX12 (GAUZE/BANDAGES/DRESSINGS) ×4 IMPLANT
DRAIN CHANNEL 15F RND FF W/TCR (WOUND CARE) ×3 IMPLANT
DRAPE TOP ARMCOVERS (MISCELLANEOUS) ×3 IMPLANT
DRAPE U-SHAPE 76X120 STRL (DRAPES) ×3 IMPLANT
DRSG PAD ABDOMINAL 8X10 ST (GAUZE/BANDAGES/DRESSINGS) ×6 IMPLANT
ELECT BLADE 4.0 EZ CLEAN MEGAD (MISCELLANEOUS) ×3
ELECT COATED BLADE 2.86 ST (ELECTRODE) ×3 IMPLANT
ELECT REM PT RETURN 9FT ADLT (ELECTROSURGICAL) ×3
ELECTRODE BLDE 4.0 EZ CLN MEGD (MISCELLANEOUS) ×2 IMPLANT
ELECTRODE REM PT RTRN 9FT ADLT (ELECTROSURGICAL) ×2 IMPLANT
EVACUATOR SILICONE 100CC (DRAIN) ×3 IMPLANT
GLOVE BIO SURGEON STRL SZ 6 (GLOVE) ×6 IMPLANT
GLOVE BIOGEL PI IND STRL 6.5 (GLOVE) ×2 IMPLANT
GLOVE BIOGEL PI INDICATOR 6.5 (GLOVE) ×1
GLOVE ECLIPSE 6.5 STRL STRAW (GLOVE) ×6 IMPLANT
GOWN STRL REUS W/ TWL LRG LVL3 (GOWN DISPOSABLE) ×4 IMPLANT
GOWN STRL REUS W/TWL LRG LVL3 (GOWN DISPOSABLE) ×2
IMPL BREAST SALINE HP 750CC (Breast) ×2 IMPLANT
IMPLANT BREAST SALINE HP 750CC (Breast) ×3 IMPLANT
IV NS 1000ML (IV SOLUTION) ×1
IV NS 1000ML BAXH (IV SOLUTION) ×2 IMPLANT
KIT FILL SYSTEM UNIVERSAL (SET/KITS/TRAYS/PACK) IMPLANT
NEEDLE HYPO 25X1 1.5 SAFETY (NEEDLE) ×3 IMPLANT
NS IRRIG 1000ML POUR BTL (IV SOLUTION) ×3 IMPLANT
PACK BASIN DAY SURGERY FS (CUSTOM PROCEDURE TRAY) ×3 IMPLANT
PENCIL BUTTON HOLSTER BLD 10FT (ELECTRODE) ×3 IMPLANT
PIN SAFETY STERILE (MISCELLANEOUS) ×3 IMPLANT
SHEET MEDIUM DRAPE 40X70 STRL (DRAPES) ×6 IMPLANT
SIZER BREAST SGL USE 750-800CC (SIZER) ×3
SIZER BRST SGL USE 750-800CC (SIZER) ×2 IMPLANT
SLEEVE SCD COMPRESS KNEE MED (MISCELLANEOUS) ×3 IMPLANT
SPONGE LAP 18X18 X RAY DECT (DISPOSABLE) ×9 IMPLANT
STAPLER VISISTAT 35W (STAPLE) ×3 IMPLANT
SUT ETHILON 2 0 FS 18 (SUTURE) ×3 IMPLANT
SUT MNCRL AB 4-0 PS2 18 (SUTURE) ×15 IMPLANT
SUT PDS AB 2-0 CT2 27 (SUTURE) IMPLANT
SUT VIC AB 3-0 PS1 18 (SUTURE) ×6
SUT VIC AB 3-0 PS1 18XBRD (SUTURE) ×12 IMPLANT
SUT VIC AB 3-0 SH 27 (SUTURE) ×2
SUT VIC AB 3-0 SH 27X BRD (SUTURE) ×4 IMPLANT
SUT VICRYL 4-0 PS2 18IN ABS (SUTURE) ×9 IMPLANT
SYR BULB IRRIGATION 50ML (SYRINGE) ×6 IMPLANT
SYR CONTROL 10ML LL (SYRINGE) IMPLANT
TAPE MEASURE VINYL STERILE (MISCELLANEOUS) IMPLANT
TOWEL OR 17X24 6PK STRL BLUE (TOWEL DISPOSABLE) ×6 IMPLANT
TUBE CONNECTING 20X1/4 (TUBING) ×6 IMPLANT
UNDERPAD 30X30 (UNDERPADS AND DIAPERS) ×6 IMPLANT
YANKAUER SUCT BULB TIP NO VENT (SUCTIONS) ×3 IMPLANT

## 2017-03-07 NOTE — Interval H&P Note (Signed)
History and Physical Interval Note:  03/07/2017 6:54 AM  Madeline Chandler  has presented today for surgery, with the diagnosis of HISTORY OF BREAST CANCER,ACQUIRED ABSCENCE OF BREAST  The various methods of treatment have been discussed with the patient and family. After consideration of risks, benefits and other options for treatment, the patient has consented to  Procedure(s): REMOVAL OF BILATERAL TISSUE EXPANDERS WITH PLACEMENT OF RIGHT BREAST IMPLANTS (Right) LEFT BREAST REDUCTION FOR SYMMETRY (Left) as a surgical intervention .  The patient's history has been reviewed, patient examined, no change in status, stable for surgery.  I have reviewed the patient's chart and labs.  Questions were answered to the patient's satisfaction.     Madeline Chandler

## 2017-03-07 NOTE — Discharge Instructions (Signed)
Information for Discharge Teaching: EXPAREL (bupivacaine liposome injectable suspension)   Your surgeon gave you EXPAREL(bupivacaine) in your surgical incision to help control your pain after surgery.   EXPAREL is a local anesthetic that provides pain relief by numbing the tissue around the surgical site.  EXPAREL is designed to release pain medication over time and can control pain for up to 72 hours.  Depending on how you respond to EXPAREL, you may require less pain medication during your recovery.  Possible side effects:  Temporary loss of sensation or ability to move in the area where bupivacaine was injected.  Nausea, vomiting, constipation  Rarely, numbness and tingling in your mouth or lips, lightheadedness, or anxiety may occur.  Call your doctor right away if you think you may be experiencing any of these sensations, or if you have other questions regarding possible side effects.  Follow all other discharge instructions given to you by your surgeon or nurse. Eat a healthy diet and drink plenty of water or other fluids.  If you return to the hospital for any reason within 96 hours following the administration of EXPAREL, please inform your health care providers.  About my Jackson-Pratt Bulb Drain  What is a Jackson-Pratt bulb? A Jackson-Pratt is a soft, round device used to collect drainage. It is connected to a long, thin drainage catheter, which is held in place by one or two small stiches near your surgical incision site. When the bulb is squeezed, it forms a vacuum, forcing the drainage to empty into the bulb.  Emptying the Jackson-Pratt bulb- To empty the bulb: 1. Release the plug on the top of the bulb. 2. Pour the bulb's contents into a measuring container which your nurse will provide. 3. Record the time emptied and amount of drainage. Empty the drain(s) as often as your     doctor or nurse recommends.  Date                  Time                    Amount (Drain  1)                 Amount (Drain 2)  _____________________________________________________________________  _____________________________________________________________________  _____________________________________________________________________  _____________________________________________________________________  _____________________________________________________________________  _____________________________________________________________________  _____________________________________________________________________  _____________________________________________________________________  Squeezing the Jackson-Pratt Bulb- To squeeze the bulb: 1. Make sure the plug at the top of the bulb is open. 2. Squeeze the bulb tightly in your fist. You will hear air squeezing from the bulb. 3. Replace the plug while the bulb is squeezed. 4. Use a safety pin to attach the bulb to your clothing. This will keep the catheter from     pulling at the bulb insertion site.  When to call your doctor- Call your doctor if:  Drain site becomes red, swollen or hot.  You have a fever greater than 101 degrees F.  There is oozing at the drain site.  Drain falls out (apply a guaze bandage over the drain hole and secure it with tape).  Drainage increases daily not related to activity patterns. (You will usually have more drainage when you are active than when you are resting.)  Drainage has a bad odor.

## 2017-03-07 NOTE — Op Note (Signed)
Operative Note   DATE OF OPERATION: 7.31.18  LOCATION: [facility]-observation  SURGICAL DIVISION: Plastic Surgery  PREOPERATIVE DIAGNOSES:  1. History right breast cancer 2. Acquired absence right breast 3. History therapeutic radiation 4. Left macromastia 5. Asymmetry native and reconstructed breast  POSTOPERATIVE DIAGNOSES:  same  PROCEDURE:  1. Left breast reduction 2. Removal right tissue expander and placement saline implant  SURGEON: Irene Limbo MD MBA  ASSISTANT: none  ANESTHESIA:  General.   EBL: 55 ml  COMPLICATIONS: None immediate.   INDICATIONS FOR PROCEDURE:  The patient, Madeline Chandler, is a 56 y.o. female born on Jul 02, 1961, is here for second stage reconstruction following right mastectomy with immediate expander and latissimus dorsi flap reconstruction. Patient has prior history right lumpectomy and adjuvant radiation.   FINDINGS: left breast reduction 640 g. Natrelle Smooth Round High Profile Saline implant placed in right chest, 750 ml filled to 800 ml. REF [id number]. SN [id number]  DESCRIPTION OF PROCEDURE:  The patient's operative site was marked with the patient in the preoperative area to mark chest midline, sternal notch, desired anterior axillary lines. Over left breast, location of inframammary fold transferred on anterior surface of breast. With aide of Wise pattern marker and lateral and medial displacement breast across meridian, location of new nipple areolar complex and 8 cm vertical limbs marked. The patientwas taken to the operating room. SCDs were placed and IV antibiotics were given. The patient's operative site was prepped and draped in a sterile fashion. A time out was performed and all information was confirmed to be correct.Incision made at superior inset latissimus skin paddle. Incision carried through subcutaneous tissue to latissimus muscle which was divided in direction fibers. Expander removed. Capsulotomies performed over  inferolateral pole. Sizer placed.  In supine position the inframammary fold was marked over left breast. The transverse limbs then marked. Superior medial pedicle designed. The NAC was marked with 42 mm cookie cutter. The remainder of pedicle deepithelialized and pedicle developed to chest wall. Over lower pole, breast tissue excised. The patient was tailor tacked and brought to upright sitting position and assessed for symmetry. A Natrelle High Profile 750 ml implant selected for right chest. A portion of the latissimus skin paddle was also tailor tacked for excision. Patient returned to supine position. Additional left breast tissue excised and patient again assessed in upright sitting position for symmetry. Patient returned to supine position and left breast irrigated and hemostasis obtained. 15 Fr JP placed and secured with 2-0 nylon. Closure completed with interrupted 3-0 vicryl in dermis for closure of vertical and transverse limbs. The NAC was inset with 4-0 vicryl in dermis. Skin closure completed with 4-0 moncryl.  At this time right breast cavity irrigated with solution containing Ancef, gentamicin, and bacitracin, followed by Betadine. Implant placed in left breast cavity and filled to 800 ml. Fill tubing removed and care taken to ensure proper orientation implant, closure fill tab. Closure was completed with running 3-0 vicryl for approximation of latissimus muscle. The area of latissimus skin paddle marked was deepithelialized. Interrupted 3-0 vicryl was placed in dermis and running 4-0 monocryl was used to close skin.   Tissue adhesive applied. Dry dressing and abdominal and breast binders applied. The patient was allowed to wake from anesthesia, extubated and taken to the recovery room in satisfactory condition.   SPECIMENS: left breast reduction  DRAINS: 15 Fr JP in left breast  Irene Limbo, MD [facility] Plastic & Reconstructive Surgery [phone], pin [phone]

## 2017-03-07 NOTE — Transfer of Care (Signed)
Immediate Anesthesia Transfer of Care Note  Patient: WAYNETTE TOWERS  Procedure(s) Performed: Procedure(s): REMOVAL OF RIGHT BREAST TISSUE EXPANDER WITH PLACEMENT OF RIGHT BREAST IMPLANT (Right) LEFT BREAST REDUCTION FOR SYMMETRY (Left)  Patient Location: PACU  Anesthesia Type:General  Level of Consciousness: awake and drowsy  Airway & Oxygen Therapy: Patient Spontanous Breathing and Patient connected to face mask oxygen  Post-op Assessment: Report given to RN and Post -op Vital signs reviewed and stable  Post vital signs: Reviewed and stable  Last Vitals:  Vitals:   03/07/17 0656  BP: (!) 111/56  Pulse: 75  Resp: 18  Temp: 37.1 C    Last Pain:  Vitals:   03/07/17 0656  TempSrc: Oral         Complications: No apparent anesthesia complications

## 2017-03-07 NOTE — Anesthesia Preprocedure Evaluation (Addendum)
Anesthesia Evaluation  Patient identified by MRN, date of birth, ID band Patient awake    Reviewed: Allergy & Precautions, H&P , NPO status , Patient's Chart, lab work & pertinent test results, reviewed documented beta blocker date and time   Airway Mallampati: II  TM Distance: >3 FB Neck ROM: Full    Dental no notable dental hx. (+) Teeth Intact, Dental Advisory Given   Pulmonary neg pulmonary ROS,    Pulmonary exam normal breath sounds clear to auscultation       Cardiovascular hypertension, Pt. on medications and Pt. on home beta blockers  Rhythm:Regular Rate:Normal     Neuro/Psych  Headaches, Anxiety Depression    GI/Hepatic Neg liver ROS, GERD  Medicated and Controlled,  Endo/Other  Morbid obesity  Renal/GU negative Renal ROS  negative genitourinary   Musculoskeletal  (+) Arthritis , Osteoarthritis,    Abdominal   Peds  Hematology negative hematology ROS (+)   Anesthesia Other Findings   Reproductive/Obstetrics negative OB ROS                            Anesthesia Physical Anesthesia Plan  ASA: III  Anesthesia Plan: General   Post-op Pain Management:    Induction: Intravenous  PONV Risk Score and Plan: 4 or greater and Ondansetron, Dexamethasone and Midazolam  Airway Management Planned: Oral ETT  Additional Equipment:   Intra-op Plan:   Post-operative Plan: Extubation in OR  Informed Consent: I have reviewed the patients History and Physical, chart, labs and discussed the procedure including the risks, benefits and alternatives for the proposed anesthesia with the patient or authorized representative who has indicated his/her understanding and acceptance.   Dental advisory given  Plan Discussed with: CRNA  Anesthesia Plan Comments:        Anesthesia Quick Evaluation

## 2017-03-07 NOTE — Anesthesia Procedure Notes (Signed)
Procedure Name: Intubation Date/Time: 03/07/2017 8:41 AM Performed by: Melynda Ripple D Pre-anesthesia Checklist: Patient identified, Emergency Drugs available, Suction available and Patient being monitored Patient Re-evaluated:Patient Re-evaluated prior to induction Oxygen Delivery Method: Circle system utilized Preoxygenation: Pre-oxygenation with 100% oxygen Induction Type: IV induction Ventilation: Mask ventilation without difficulty Laryngoscope Size: 3 and Mac Grade View: Grade II Tube type: Oral Number of attempts: 2 Airway Equipment and Method: Stylet and Oral airway Placement Confirmation: ETT inserted through vocal cords under direct vision,  positive ETCO2 and breath sounds checked- equal and bilateral Secured at: 22 cm Tube secured with: Tape Dental Injury: Teeth and Oropharynx as per pre-operative assessment  Difficulty Due To: Difficult Airway- due to reduced neck mobility, Difficult Airway- due to large tongue and Difficult Airway- due to anterior larynx

## 2017-03-07 NOTE — Anesthesia Postprocedure Evaluation (Signed)
Anesthesia Post Note  Patient: Madeline Chandler  Procedure(s) Performed: Procedure(s) (LRB): REMOVAL OF RIGHT BREAST TISSUE EXPANDER WITH PLACEMENT OF RIGHT BREAST IMPLANT (Right) LEFT BREAST REDUCTION FOR SYMMETRY (Left)     Patient location during evaluation: PACU Anesthesia Type: General Level of consciousness: awake and alert Pain management: pain level controlled Vital Signs Assessment: post-procedure vital signs reviewed and stable Respiratory status: spontaneous breathing, nonlabored ventilation, respiratory function stable and patient connected to nasal cannula oxygen Cardiovascular status: blood pressure returned to baseline and stable Postop Assessment: no signs of nausea or vomiting Anesthetic complications: no    Last Vitals:  Vitals:   03/07/17 1200 03/07/17 1230  BP: 120/71 121/74  Pulse: 82 81  Resp: 10 14  Temp:  36.7 C    Last Pain:  Vitals:   03/07/17 1230  TempSrc:   PainSc: 3                  Dorrance Sellick,W. EDMOND

## 2017-03-08 ENCOUNTER — Encounter (HOSPITAL_BASED_OUTPATIENT_CLINIC_OR_DEPARTMENT_OTHER): Payer: Self-pay | Admitting: Plastic Surgery

## 2017-03-08 DIAGNOSIS — D242 Benign neoplasm of left breast: Secondary | ICD-10-CM | POA: Diagnosis not present

## 2017-03-08 NOTE — Addendum Note (Signed)
Addendum  created 03/08/17 1007 by Tawni Millers, CRNA   Anesthesia Intra Meds edited, Charge Capture section accepted

## 2017-03-13 ENCOUNTER — Telehealth: Payer: Self-pay | Admitting: Family Medicine

## 2017-03-13 MED ORDER — PANTOPRAZOLE SODIUM 40 MG PO TBEC: 40.0000 mg | DELAYED_RELEASE_TABLET | Freq: Every evening | ORAL | 0 refills | Status: DC

## 2017-03-13 MED ORDER — CARVEDILOL 25 MG PO TABS: 25.0000 mg | ORAL_TABLET | Freq: Two times a day (BID) | ORAL | 0 refills | Status: DC

## 2017-03-13 NOTE — Telephone Encounter (Signed)
Rx's approved and sent to the pharmacy by e-script.  However only given #60.  The patient needs further evaluation and/or laboratory testing before further refills are given.  Ask patient to make an appointment for this.//AB/CMA

## 2017-03-13 NOTE — Telephone Encounter (Signed)
Self.  Refill for : Carvedilol, pantoprazole   Pharmacy: [facility] [place]

## 2017-03-24 ENCOUNTER — Other Ambulatory Visit: Payer: Self-pay

## 2017-03-24 ENCOUNTER — Telehealth: Payer: Self-pay

## 2017-03-24 ENCOUNTER — Ambulatory Visit (HOSPITAL_BASED_OUTPATIENT_CLINIC_OR_DEPARTMENT_OTHER): Payer: 59 | Admitting: Oncology

## 2017-03-24 VITALS — BP 118/64 | HR 85 | Temp 98.6°F | Resp 18 | Ht 65.5 in | Wt 242.3 lb

## 2017-03-24 DIAGNOSIS — C50911 Malignant neoplasm of unspecified site of right female breast: Secondary | ICD-10-CM

## 2017-03-24 DIAGNOSIS — C50211 Malignant neoplasm of upper-inner quadrant of right female breast: Secondary | ICD-10-CM | POA: Diagnosis not present

## 2017-03-24 DIAGNOSIS — Z170 Estrogen receptor positive status [ER+]: Secondary | ICD-10-CM | POA: Diagnosis not present

## 2017-03-24 NOTE — Telephone Encounter (Signed)
appts made per 8/17 los and avs printed  Clayvon Parlett

## 2017-03-24 NOTE — Progress Notes (Signed)
  [place] OFFICE PROGRESS NOTE   Diagnosis: Breast cancer  INTERVAL HISTORY:   Madeline Chandler returns as scheduled. She is now taking Femara. Mild hot flashes. No significant arthralgias. She underwent a left breast reduction surgery and placement of a right breast implant on 03/07/2017. She reports pruritus over the left breast.  Objective:  Vital signs in last 24 hours:  Blood pressure 118/64, pulse 85, temperature 98.6 F (37 C), temperature source Oral, resp. rate 18, height 5' 5.5" (1.664 m), weight 242 lb 4.8 oz (109.9 kg), SpO2 97 %.    HEENT: Neck without mass Lymphatics: No cervical, supraclavicular, or axillary nodes Resp: Lungs clear bilaterally Cardio: Regular rate and rhythm GI: No hepatomegaly Vascular: No leg or arm edema Breasts: Status post right mastectomy with an implant in place. Status post left breast reduction with a healing incision. faint erythema over the left breast.    Medications: I have reviewed the patient's current medications.  Assessment/Plan: 1. Stage II (T2 N0) sees right-sided breast cancer diagnosed in September 1997, status post a lumpectomy, sentinel lymph node biopsy, and right axillary dissection and radiation followed by adjuvant chemotherapy. She completed 5 years of tamoxifen.  2. 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 Hysterectomy and bilateral oophorectomy April 2008  4. Right breast implant and left breast reduction 03/07/2017    Disposition:  [name] appears to be tolerating  the Femara well. She will return for an office visit in 6 months. She will undergo a left mammogram in January.  She will be sure her bone density has been checked within the past few years.  15 minutes were spent with the patient today. The majority of the time was used for counseling and coordination of care.  Donneta Romberg, MD  03/24/2017  8:56 AM

## 2017-04-10 ENCOUNTER — Other Ambulatory Visit: Payer: Self-pay | Admitting: Family Medicine

## 2017-05-12 ENCOUNTER — Other Ambulatory Visit: Payer: Self-pay | Admitting: Family Medicine

## 2017-06-12 ENCOUNTER — Other Ambulatory Visit: Payer: Self-pay | Admitting: Family Medicine

## 2017-06-13 ENCOUNTER — Other Ambulatory Visit: Payer: Self-pay | Admitting: Family Medicine

## 2017-06-23 NOTE — H&P (Signed)
  Subjective:     Patient ID: Madeline Chandler is a 56 y.o. female.  Follow-up   3 months post op implant exchange and left breast reduction.   PMH significant for history right breast ca diagnosed 1997, treated with lumpectomy and adjuvant chemotherapy and radiation, 5 years tamoxifen. Routine follow up imaging 08/2016 with right breast distortion. MMG/US with area of concern right breast 1 o clock. 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 Final pathology with intermediate grade DCIS, margins clear. Prior ALND and no lymph tissue noted in specimen labeled right axillary contents.  Genetics negative. On Femara.   Prior to lumpectomy B cup, prior to reduction D cup on left. Right mastectomy 547 g left breast reduction 640 g.  Last imaging on left 1/18.     Objective:   Physical Exam  Cardiovascular: Normal rate.  Normal heart sounds Pulmonary/Chest: Effort normal. Clear to ausculatation  Chest: scars maturing, left volume >right small amount SN to nipple left 23 BW 19 cm Nipple to IMF 11 cm Left nipple nearly flush with areola/minimal height with stimulation Areas of hypopigmentation improving on right, minimal remains. No lateral displacement in supine position, no animation Baker 1 right     Assessment:     DCIS right breast, microscopic focus IDC UIQ ER/PR+, Her2+ History right breast ca, s/p lumpectomy, XRT S/p right mastectomy, LD/TE reconstruction S/p debridement right mastectomy flap S/p removal TE, placement saline implant, left breast reduction    Plan:   Patient feels overall volume symmetric, does not desire any change to implant size.  Discussed options of NAC reconstruction including tattoo alone by myself, referral to tattoo artist for 3d tattoo, or local flap recon of nipple with FTSG vs tattoo for areola. Reviewed portfolio pictures for each option. She has elected for nipple reconstruction and  FTSG. Discussed OP surgery, bolster for 5-7 days, risk loss nipple height and may need additional tattoo to improve color.   Natrelle Smooth Round High Profile Saline implant placed in right chest, 750 ml filled to 800 ml. REF [id number].  Irene Limbo, MD [facility] Plastic & Reconstructive Surgery [phone], pin [phone]

## 2017-07-05 ENCOUNTER — Other Ambulatory Visit: Payer: Self-pay

## 2017-07-05 ENCOUNTER — Encounter (HOSPITAL_BASED_OUTPATIENT_CLINIC_OR_DEPARTMENT_OTHER)
Admission: RE | Admit: 2017-07-05 | Discharge: 2017-07-05 | Disposition: A | Discharge status: Home / Self Care | Payer: 59 | Source: Ambulatory Visit | Attending: Plastic Surgery | Admitting: Plastic Surgery

## 2017-07-05 ENCOUNTER — Encounter (HOSPITAL_BASED_OUTPATIENT_CLINIC_OR_DEPARTMENT_OTHER): Payer: Self-pay | Admitting: *Deleted

## 2017-07-05 DIAGNOSIS — Z79899 Other long term (current) drug therapy: Secondary | ICD-10-CM | POA: Diagnosis not present

## 2017-07-05 DIAGNOSIS — Z853 Personal history of malignant neoplasm of breast: Secondary | ICD-10-CM | POA: Diagnosis present

## 2017-07-05 DIAGNOSIS — F419 Anxiety disorder, unspecified: Secondary | ICD-10-CM | POA: Diagnosis not present

## 2017-07-05 DIAGNOSIS — I10 Essential (primary) hypertension: Secondary | ICD-10-CM | POA: Diagnosis not present

## 2017-07-05 DIAGNOSIS — Z9011 Acquired absence of right breast and nipple: Secondary | ICD-10-CM | POA: Diagnosis not present

## 2017-07-05 DIAGNOSIS — F329 Major depressive disorder, single episode, unspecified: Secondary | ICD-10-CM | POA: Diagnosis not present

## 2017-07-05 DIAGNOSIS — K219 Gastro-esophageal reflux disease without esophagitis: Secondary | ICD-10-CM | POA: Diagnosis not present

## 2017-07-05 LAB — BASIC METABOLIC PANEL
Anion gap: 6 (ref 5–15)
BUN: 8 mg/dL (ref 6–20)
CALCIUM: 9.9 mg/dL (ref 8.9–10.3)
CO2: 28 mmol/L (ref 22–32)
CREATININE: 1.01 mg/dL — AB (ref 0.44–1.00)
Chloride: 104 mmol/L (ref 101–111)
GFR calc Af Amer: >60 mL/min (ref >60)
Glucose, Bld: 91 mg/dL (ref 65–99)
Potassium: 3.9 mmol/L (ref 3.5–5.1)
SODIUM: 138 mmol/L (ref 135–145)

## 2017-07-05 NOTE — Progress Notes (Signed)
Ensure pre surgery drink given with instructions to complete by Elmo, hibiclens soap given with instructions, pt verbalized understanding.

## 2017-07-06 NOTE — Anesthesia Preprocedure Evaluation (Addendum)
Anesthesia Evaluation  Patient identified by MRN, date of birth, ID band Patient awake    Reviewed: Allergy & Precautions, NPO status , Patient's Chart, lab work & pertinent test results  Airway Mallampati: III  TM Distance: >3 FB Neck ROM: Full    Dental  (+) Teeth Intact, Dental Advisory Given   Pulmonary neg pulmonary ROS,    breath sounds clear to auscultation       Cardiovascular hypertension, Pt. on medications and Pt. on home beta blockers  Rhythm:Regular Rate:Normal     Neuro/Psych  Headaches, PSYCHIATRIC DISORDERS Anxiety Depression    GI/Hepatic Neg liver ROS, GERD  Medicated,  Endo/Other  negative endocrine ROS  Renal/GU negative Renal ROS     Musculoskeletal  (+) Arthritis ,   Abdominal (+) + obese,   Peds  Hematology negative hematology ROS (+)   Anesthesia Other Findings Day of surgery medications reviewed with the patient.  Reproductive/Obstetrics                           Anesthesia Physical Anesthesia Plan  ASA: III  Anesthesia Plan: General   Post-op Pain Management:    Induction: Intravenous  PONV Risk Score and Plan: 4 or greater and Ondansetron, Dexamethasone, Midazolam and Scopolamine patch - Pre-op  Airway Management Planned: LMA  Additional Equipment:   Intra-op Plan:   Post-operative Plan: Extubation in OR  Informed Consent: I have reviewed the patients History and Physical, chart, labs and discussed the procedure including the risks, benefits and alternatives for the proposed anesthesia with the patient or authorized representative who has indicated his/her understanding and acceptance.   Dental advisory given  Plan Discussed with: CRNA  Anesthesia Plan Comments: (Possible OETT, pending position)      Anesthesia Quick Evaluation

## 2017-07-07 ENCOUNTER — Ambulatory Visit (HOSPITAL_BASED_OUTPATIENT_CLINIC_OR_DEPARTMENT_OTHER): Payer: 59 | Admitting: Anesthesiology

## 2017-07-07 ENCOUNTER — Ambulatory Visit (HOSPITAL_BASED_OUTPATIENT_CLINIC_OR_DEPARTMENT_OTHER)
Admission: RE | Admit: 2017-07-07 | Discharge: 2017-07-07 | Disposition: A | Discharge status: Home / Self Care | Payer: 59 | Source: Ambulatory Visit | Attending: Plastic Surgery | Admitting: Plastic Surgery

## 2017-07-07 ENCOUNTER — Encounter (HOSPITAL_BASED_OUTPATIENT_CLINIC_OR_DEPARTMENT_OTHER): Payer: Self-pay | Admitting: Anesthesiology

## 2017-07-07 ENCOUNTER — Encounter (HOSPITAL_BASED_OUTPATIENT_CLINIC_OR_DEPARTMENT_OTHER)
Admission: RE | Disposition: A | Discharge status: Home / Self Care | Payer: Self-pay | Source: Ambulatory Visit | Attending: Plastic Surgery

## 2017-07-07 DIAGNOSIS — Z9011 Acquired absence of right breast and nipple: Secondary | ICD-10-CM | POA: Insufficient documentation

## 2017-07-07 DIAGNOSIS — F419 Anxiety disorder, unspecified: Secondary | ICD-10-CM | POA: Insufficient documentation

## 2017-07-07 DIAGNOSIS — Z853 Personal history of malignant neoplasm of breast: Secondary | ICD-10-CM | POA: Diagnosis not present

## 2017-07-07 DIAGNOSIS — I10 Essential (primary) hypertension: Secondary | ICD-10-CM | POA: Insufficient documentation

## 2017-07-07 DIAGNOSIS — K219 Gastro-esophageal reflux disease without esophagitis: Secondary | ICD-10-CM | POA: Insufficient documentation

## 2017-07-07 DIAGNOSIS — Z79899 Other long term (current) drug therapy: Secondary | ICD-10-CM | POA: Insufficient documentation

## 2017-07-07 DIAGNOSIS — F329 Major depressive disorder, single episode, unspecified: Secondary | ICD-10-CM | POA: Insufficient documentation

## 2017-07-07 HISTORY — PX: BREAST RECONSTRUCTION: SHX9

## 2017-07-07 SURGERY — AEROLA/NIPPLE RECONSTRUCTION WITH GRAFT
Anesthesia: General | Site: Breast | Laterality: Right

## 2017-07-07 MED ORDER — HYDROMORPHONE HCL 1 MG/ML IJ SOLN: 0.2500 mg | INTRAMUSCULAR | Status: DC | PRN

## 2017-07-07 MED ORDER — CHLORHEXIDINE GLUCONATE CLOTH 2 % EX PADS: 6.0000 | MEDICATED_PAD | Freq: Once | CUTANEOUS | Status: DC

## 2017-07-07 MED ORDER — OXYCODONE HCL 5 MG PO TABS: 5.0000 mg | ORAL_TABLET | Freq: Once | ORAL | Status: DC | PRN

## 2017-07-07 MED ORDER — EPHEDRINE SULFATE-NACL 50-0.9 MG/10ML-% IV SOSY
PREFILLED_SYRINGE | INTRAVENOUS | Status: DC | PRN
  Administered 2017-07-07 (×3): 10 mg via INTRAVENOUS

## 2017-07-07 MED ORDER — PROPOFOL 500 MG/50ML IV EMUL
INTRAVENOUS | Status: AC
  Filled 2017-07-07: qty 50

## 2017-07-07 MED ORDER — CEFAZOLIN SODIUM-DEXTROSE 2-4 GM/100ML-% IV SOLN
INTRAVENOUS | Status: AC
  Filled 2017-07-07: qty 100

## 2017-07-07 MED ORDER — DEXAMETHASONE SODIUM PHOSPHATE 4 MG/ML IJ SOLN
INTRAMUSCULAR | Status: DC | PRN
  Administered 2017-07-07: 10 mg via INTRAVENOUS

## 2017-07-07 MED ORDER — DEXAMETHASONE SODIUM PHOSPHATE 10 MG/ML IJ SOLN
INTRAMUSCULAR | Status: AC
  Filled 2017-07-07: qty 1

## 2017-07-07 MED ORDER — PHENYLEPHRINE HCL 10 MG/ML IJ SOLN
INTRAVENOUS | Status: DC | PRN
  Administered 2017-07-07: 40 ug/min via INTRAVENOUS

## 2017-07-07 MED ORDER — BACITRACIN ZINC 500 UNIT/GM EX OINT
TOPICAL_OINTMENT | CUTANEOUS | Status: AC
  Filled 2017-07-07: qty 1.8

## 2017-07-07 MED ORDER — PHENYLEPHRINE HCL 10 MG/ML IJ SOLN
INTRAMUSCULAR | Status: AC
  Filled 2017-07-07: qty 1

## 2017-07-07 MED ORDER — LIDOCAINE HCL (PF) 2 % IJ SOLN
INTRAMUSCULAR | Status: DC | PRN
  Administered 2017-07-07: 100 mg via INTRADERMAL

## 2017-07-07 MED ORDER — BACITRACIN ZINC 500 UNIT/GM EX OINT
TOPICAL_OINTMENT | CUTANEOUS | Status: DC | PRN
  Administered 2017-07-07: 1 via TOPICAL

## 2017-07-07 MED ORDER — LIDOCAINE-EPINEPHRINE 1 %-1:100000 IJ SOLN
INTRAMUSCULAR | Status: DC | PRN
  Administered 2017-07-07: 10 mL

## 2017-07-07 MED ORDER — PROPOFOL 10 MG/ML IV BOLUS
INTRAVENOUS | Status: DC | PRN
  Administered 2017-07-07: 200 mg via INTRAVENOUS

## 2017-07-07 MED ORDER — ONDANSETRON HCL 4 MG/2ML IJ SOLN
INTRAMUSCULAR | Status: AC
  Filled 2017-07-07: qty 2

## 2017-07-07 MED ORDER — OXYCODONE HCL 5 MG/5ML PO SOLN: 5.0000 mg | Freq: Once | ORAL | Status: DC | PRN

## 2017-07-07 MED ORDER — PHENYLEPHRINE 40 MCG/ML (10ML) SYRINGE FOR IV PUSH (FOR BLOOD PRESSURE SUPPORT)
PREFILLED_SYRINGE | INTRAVENOUS | Status: DC | PRN
  Administered 2017-07-07 (×4): 80 ug via INTRAVENOUS

## 2017-07-07 MED ORDER — GLYCOPYRROLATE 0.2 MG/ML IJ SOLN
INTRAMUSCULAR | Status: DC | PRN
  Administered 2017-07-07: 0.2 mg via INTRAVENOUS

## 2017-07-07 MED ORDER — LACTATED RINGERS IV SOLN
INTRAVENOUS | Status: DC
  Administered 2017-07-07 (×3): via INTRAVENOUS

## 2017-07-07 MED ORDER — FENTANYL CITRATE (PF) 100 MCG/2ML IJ SOLN
50.0000 ug | INTRAMUSCULAR | Status: DC | PRN
  Administered 2017-07-07: 100 ug via INTRAVENOUS

## 2017-07-07 MED ORDER — LACTATED RINGERS IV SOLN: INTRAVENOUS | Status: DC

## 2017-07-07 MED ORDER — MIDAZOLAM HCL 2 MG/2ML IJ SOLN
1.0000 mg | INTRAMUSCULAR | Status: DC | PRN
  Administered 2017-07-07: 2 mg via INTRAVENOUS

## 2017-07-07 MED ORDER — ACETAMINOPHEN 500 MG PO TABS
1000.0000 mg | ORAL_TABLET | ORAL | Status: AC
  Administered 2017-07-07: 1000 mg via ORAL

## 2017-07-07 MED ORDER — ACETAMINOPHEN 500 MG PO TABS
ORAL_TABLET | ORAL | Status: AC
  Filled 2017-07-07: qty 2

## 2017-07-07 MED ORDER — CELECOXIB 200 MG PO CAPS
ORAL_CAPSULE | ORAL | Status: AC
  Filled 2017-07-07: qty 1

## 2017-07-07 MED ORDER — LIDOCAINE-EPINEPHRINE 1 %-1:100000 IJ SOLN
INTRAMUSCULAR | Status: AC
  Filled 2017-07-07: qty 1

## 2017-07-07 MED ORDER — SCOPOLAMINE 1 MG/3DAYS TD PT72: 1.0000 | MEDICATED_PATCH | Freq: Once | TRANSDERMAL | Status: DC | PRN

## 2017-07-07 MED ORDER — ONDANSETRON HCL 4 MG/2ML IJ SOLN
INTRAMUSCULAR | Status: DC | PRN
  Administered 2017-07-07: 4 mg via INTRAVENOUS

## 2017-07-07 MED ORDER — GABAPENTIN 300 MG PO CAPS
ORAL_CAPSULE | ORAL | Status: AC
  Filled 2017-07-07: qty 1

## 2017-07-07 MED ORDER — CEFAZOLIN SODIUM-DEXTROSE 2-4 GM/100ML-% IV SOLN: 2.0000 g | INTRAVENOUS | Status: DC

## 2017-07-07 MED ORDER — MEPERIDINE HCL 25 MG/ML IJ SOLN: 6.2500 mg | INTRAMUSCULAR | Status: DC | PRN

## 2017-07-07 MED ORDER — PROMETHAZINE HCL 25 MG/ML IJ SOLN: 6.2500 mg | INTRAMUSCULAR | Status: DC | PRN

## 2017-07-07 MED ORDER — GABAPENTIN 300 MG PO CAPS
300.0000 mg | ORAL_CAPSULE | ORAL | Status: AC
  Administered 2017-07-07: 300 mg via ORAL

## 2017-07-07 MED ORDER — MIDAZOLAM HCL 2 MG/2ML IJ SOLN
INTRAMUSCULAR | Status: AC
  Filled 2017-07-07: qty 2

## 2017-07-07 MED ORDER — FENTANYL CITRATE (PF) 100 MCG/2ML IJ SOLN
INTRAMUSCULAR | Status: AC
  Filled 2017-07-07: qty 2

## 2017-07-07 MED ORDER — SUCCINYLCHOLINE CHLORIDE 200 MG/10ML IV SOSY
PREFILLED_SYRINGE | INTRAVENOUS | Status: AC
  Filled 2017-07-07: qty 10

## 2017-07-07 MED ORDER — HYDROCODONE-ACETAMINOPHEN 5-325 MG PO TABS: 1.0000 | ORAL_TABLET | ORAL | 0 refills | Status: DC | PRN

## 2017-07-07 MED ORDER — CELECOXIB 200 MG PO CAPS
200.0000 mg | ORAL_CAPSULE | ORAL | Status: AC
  Administered 2017-07-07: 200 mg via ORAL

## 2017-07-07 MED ORDER — LIDOCAINE 2% (20 MG/ML) 5 ML SYRINGE
INTRAMUSCULAR | Status: AC
  Filled 2017-07-07: qty 5

## 2017-07-07 SURGICAL SUPPLY — 61 items
BENZOIN TINCTURE PRP APPL 2/3 (GAUZE/BANDAGES/DRESSINGS) IMPLANT
BLADE CLIPPER SURG (BLADE) ×3 IMPLANT
BLADE SURG 15 STRL LF DISP TIS (BLADE) ×4 IMPLANT
BLADE SURG 15 STRL SS (BLADE) ×2
BRUSH SCRUB EZ PLAIN DRY (MISCELLANEOUS) ×3 IMPLANT
CHLORAPREP W/TINT 26ML (MISCELLANEOUS) ×6 IMPLANT
CLEANER CAUTERY TIP 5X5 PAD (MISCELLANEOUS) IMPLANT
COTTONBALL LRG STERILE PKG (GAUZE/BANDAGES/DRESSINGS) IMPLANT
COVER BACK TABLE 60X90IN (DRAPES) ×3 IMPLANT
COVER MAYO STAND STRL (DRAPES) ×3 IMPLANT
DECANTER SPIKE VIAL GLASS SM (MISCELLANEOUS) IMPLANT
DERMABOND ADVANCED (GAUZE/BANDAGES/DRESSINGS) ×1
DERMABOND ADVANCED .7 DNX12 (GAUZE/BANDAGES/DRESSINGS) ×2 IMPLANT
DRAPE TOP ARMCOVERS (MISCELLANEOUS) ×3 IMPLANT
DRAPE U-SHAPE 76X120 STRL (DRAPES) ×3 IMPLANT
DRAPE UTILITY XL STRL (DRAPES) ×3 IMPLANT
DRSG EMULSION OIL 3X3 NADH (GAUZE/BANDAGES/DRESSINGS) ×3 IMPLANT
DRSG PAD ABDOMINAL 8X10 ST (GAUZE/BANDAGES/DRESSINGS) IMPLANT
DRSG TEGADERM 4X10 (GAUZE/BANDAGES/DRESSINGS) IMPLANT
DRSG TEGADERM 4X4.75 (GAUZE/BANDAGES/DRESSINGS) IMPLANT
ELECT COATED BLADE 2.86 ST (ELECTRODE) ×3 IMPLANT
ELECT NEEDLE BLADE 2-5/6 (NEEDLE) ×3 IMPLANT
ELECT REM PT RETURN 9FT ADLT (ELECTROSURGICAL) ×3
ELECTRODE REM PT RTRN 9FT ADLT (ELECTROSURGICAL) ×2 IMPLANT
GAUZE SPONGE 4X4 12PLY STRL (GAUZE/BANDAGES/DRESSINGS) ×3 IMPLANT
GAUZE SPONGE 4X4 12PLY STRL LF (GAUZE/BANDAGES/DRESSINGS) IMPLANT
GAUZE XEROFORM 1X8 LF (GAUZE/BANDAGES/DRESSINGS) IMPLANT
GAUZE XEROFORM 5X9 LF (GAUZE/BANDAGES/DRESSINGS) IMPLANT
GLOVE BIO SURGEON STRL SZ 6 (GLOVE) ×3 IMPLANT
GLOVE BIO SURGEON STRL SZ 6.5 (GLOVE) ×3 IMPLANT
GLOVE BIOGEL PI IND STRL 7.0 (GLOVE) ×4 IMPLANT
GLOVE BIOGEL PI INDICATOR 7.0 (GLOVE) ×2
GLOVE ECLIPSE 6.5 STRL STRAW (GLOVE) ×6 IMPLANT
GLOVE EXAM NITRILE PF LG BLUE (GLOVE) IMPLANT
GOWN STRL REUS W/ TWL LRG LVL3 (GOWN DISPOSABLE) ×6 IMPLANT
GOWN STRL REUS W/TWL LRG LVL3 (GOWN DISPOSABLE) ×3
NEEDLE HYPO 30GX1 BEV (NEEDLE) IMPLANT
NEEDLE PRECISIONGLIDE 27X1.5 (NEEDLE) IMPLANT
NS IRRIG 1000ML POUR BTL (IV SOLUTION) ×3 IMPLANT
PACK BASIN DAY SURGERY FS (CUSTOM PROCEDURE TRAY) ×3 IMPLANT
PAD CLEANER CAUTERY TIP 5X5 (MISCELLANEOUS)
PENCIL BUTTON HOLSTER BLD 10FT (ELECTRODE) ×3 IMPLANT
SHEET MEDIUM DRAPE 40X70 STRL (DRAPES) ×3 IMPLANT
SLEEVE SCD COMPRESS KNEE MED (MISCELLANEOUS) ×3 IMPLANT
SPONGE GAUZE 2X2 8PLY STRL LF (GAUZE/BANDAGES/DRESSINGS) IMPLANT
STAPLER VISISTAT 35W (STAPLE) ×3 IMPLANT
STRIP CLOSURE SKIN 1/2X4 (GAUZE/BANDAGES/DRESSINGS) IMPLANT
STRIP CLOSURE SKIN 1/4X4 (GAUZE/BANDAGES/DRESSINGS) IMPLANT
SUT CHROMIC 4 0 P 3 18 (SUTURE) IMPLANT
SUT MNCRL AB 4-0 PS2 18 (SUTURE) ×3 IMPLANT
SUT MON AB 5-0 P3 18 (SUTURE) ×3 IMPLANT
SUT PLAIN 5 0 P 3 18 (SUTURE) ×6 IMPLANT
SUT PROLENE 5 0 P 3 (SUTURE) IMPLANT
SUT PROLENE 5 0 PS 2 (SUTURE) IMPLANT
SUT PROLENE 6 0 P 1 18 (SUTURE) IMPLANT
SUT SILK 4 0 PS 2 (SUTURE) ×6 IMPLANT
SUT VICRYL 4-0 PS2 18IN ABS (SUTURE) ×3 IMPLANT
SYR BULB 3OZ (MISCELLANEOUS) IMPLANT
SYR CONTROL 10ML LL (SYRINGE) ×3 IMPLANT
TOWEL OR 17X24 6PK STRL BLUE (TOWEL DISPOSABLE) ×6 IMPLANT
TRAY DSU PREP LF (CUSTOM PROCEDURE TRAY) IMPLANT

## 2017-07-07 NOTE — Interval H&P Note (Signed)
History and Physical Interval Note:  07/07/2017 7:03 AM  Madeline Chandler  has presented today for surgery, with the diagnosis of History of breast cancer  The various methods of treatment have been discussed with the patient and family. After consideration of risks, benefits and other options for treatment, the patient has consented to  Procedure(s): RIGHT NIPPLE AEROLA RECONSTRUCTION WITH LOCAL FLAP, FULL THICKNESS SKIN GRAFT FROM RIGHT OR LEFT GROIN (Right) SKIN GRAFT FULL THICKNESS FROM RIGHT OR LEFT GROIN (N/A) as a surgical intervention .  The patient's history has been reviewed, patient examined, no change in status, stable for surgery.  I have reviewed the patient's chart and labs.  Questions were answered to the patient's satisfaction.     Alvera Tourigny

## 2017-07-07 NOTE — Anesthesia Procedure Notes (Signed)
Procedure Name: LMA Insertion Date/Time: 07/07/2017 7:33 AM Performed by: Lyndee Leo, CRNA Pre-anesthesia Checklist: Patient identified, Emergency Drugs available, Suction available and Patient being monitored Patient Re-evaluated:Patient Re-evaluated prior to induction Oxygen Delivery Method: Circle system utilized Preoxygenation: Pre-oxygenation with 100% oxygen Induction Type: IV induction Ventilation: Mask ventilation without difficulty LMA: LMA inserted LMA Size: 4.0 Number of attempts: 1 Airway Equipment and Method: Bite block Placement Confirmation: positive ETCO2 Tube secured with: Tape Dental Injury: Teeth and Oropharynx as per pre-operative assessment

## 2017-07-07 NOTE — Anesthesia Postprocedure Evaluation (Signed)
Anesthesia Post Note  Patient: Madeline Chandler  Procedure(s) Performed: RIGHT NIPPLE AEROLA RECONSTRUCTION WITH LOCAL FLAP FROM RIGHT GROIN (Right Breast)     Patient location during evaluation: PACU Anesthesia Type: General Level of consciousness: sedated and patient cooperative Pain management: pain level controlled Vital Signs Assessment: post-procedure vital signs reviewed and stable Respiratory status: spontaneous breathing Cardiovascular status: stable Anesthetic complications: no    Last Vitals:  Vitals:   07/07/17 0857 07/07/17 0900  BP: 121/72 129/76  Pulse: 94 89  Resp: (!) 22 (!) 22  Temp: 36.8 C   SpO2: 100% 100%    Last Pain:  Vitals:   07/07/17 0900  TempSrc:   PainSc: 0-No pain                 Nolon Nations

## 2017-07-07 NOTE — Op Note (Signed)
Operative Note   DATE OF OPERATION: 11.30.18  LOCATION: [facility]-outpatient  SURGICAL DIVISION: Plastic Surgery  PREOPERATIVE DIAGNOSES:  1. History right breast cancer 2. Acquired absence breast 3. History therapeutic radiation  POSTOPERATIVE DIAGNOSES:  same  PROCEDURE:  Right nipple areolar reconstruction with local flap and full thickness skin graft from right groin  SURGEON: Irene Limbo MD MBA  ASSISTANT: S. Rayburn PA-C  ANESTHESIA:  General.   EBL: 5 ml  COMPLICATIONS: None immediate.   INDICATIONS FOR PROCEDURE:  The patient, Madeline Chandler, is a 56 y.o. female born on Jan 02, 1961, is here for NAC reconstruction following right latissimus dorsi flap and implant based reconstruction following mastectomy.   FINDINGS: Superiorly based Skate flap for reconstruction.  DESCRIPTION OF PROCEDURE:  The patient's operative site was marked with the patient in the preoperative area to include planned nipple location and areola diameter symmetric with left breast. The patient was taken to the operating room. SCDs were placed and IV antibiotics were given. The patient's operative site was prepped and draped in a sterile fashion. A time out was performed and all information was confirmed to be correct. Local anesthetic infiltrated over right groin donor site. Elliptical excision of skin of left groin crease completed. The graft was prepared by removing subcutaneous fat to dermis. The donor site was closed with 4-0 vicryl in dermis and running 4-0 monocryl subcuticular skin closure. Dermabond applied.  Over right chest, Skate flap designed. The flap margins were sharply incised, pedicle preserved over superior chest. Flap including subcutaneous tissue elevated and inset with 5-0 monocryl in dermis and 5-0 plain gut for skin. The remainder of marked area for areola was deepithelialized. The skin graft with inset with 5-0 plain gut. The graft was perforated and antibiotic ointment  and Adaptic applied over nipple reconstruction and graft. Sterile sponge applied as bolster and secured with 4-0 silk.  The patient was allowed to wake from anesthesia, extubated and taken to the recovery room in satisfactory condition.   SPECIMENS: none  DRAINS: none  Irene Limbo, MD [facility] Plastic & Reconstructive Surgery [phone], pin [phone]

## 2017-07-07 NOTE — Transfer of Care (Signed)
Immediate Anesthesia Transfer of Care Note  Patient: Madeline Chandler  Procedure(s) Performed: RIGHT NIPPLE AEROLA RECONSTRUCTION WITH LOCAL FLAP FROM RIGHT GROIN (Right Breast)  Patient Location: PACU  Anesthesia Type:General  Level of Consciousness: awake, sedated and patient cooperative  Airway & Oxygen Therapy: Patient Spontanous Breathing and Patient connected to face mask oxygen  Post-op Assessment: Report given to RN and Post -op Vital signs reviewed and stable  Post vital signs: Reviewed and stable  Last Vitals:  Vitals:   07/07/17 0659 07/07/17 0857  BP: (!) 144/66 121/72  Pulse: 74 94  Resp: 18 (!) 22  Temp: 36.9 C   SpO2: 98% 100%    Last Pain:  Vitals:   07/07/17 0659  TempSrc: Oral  PainSc: 0-No pain         Complications: No apparent anesthesia complications

## 2017-07-07 NOTE — Discharge Instructions (Signed)

## 2017-07-10 ENCOUNTER — Encounter (HOSPITAL_BASED_OUTPATIENT_CLINIC_OR_DEPARTMENT_OTHER): Payer: Self-pay | Admitting: Plastic Surgery

## 2017-08-05 ENCOUNTER — Other Ambulatory Visit: Payer: Self-pay | Admitting: Family Medicine

## 2017-08-07 ENCOUNTER — Other Ambulatory Visit: Payer: Self-pay | Admitting: Family Medicine

## 2017-09-03 ENCOUNTER — Other Ambulatory Visit: Payer: Self-pay | Admitting: Family Medicine

## 2017-09-10 ENCOUNTER — Other Ambulatory Visit: Payer: Self-pay | Admitting: Family Medicine

## 2017-09-26 ENCOUNTER — Telehealth: Payer: Self-pay

## 2017-09-26 ENCOUNTER — Encounter: Payer: Self-pay | Admitting: Oncology

## 2017-09-26 ENCOUNTER — Inpatient Hospital Stay: Payer: 59 | Attending: Oncology | Admitting: Oncology

## 2017-09-26 VITALS — BP 145/76 | HR 80 | Temp 98.3°F | Resp 17 | Ht 65.5 in | Wt 239.5 lb

## 2017-09-26 DIAGNOSIS — Z853 Personal history of malignant neoplasm of breast: Secondary | ICD-10-CM

## 2017-09-26 DIAGNOSIS — Z170 Estrogen receptor positive status [ER+]: Secondary | ICD-10-CM | POA: Diagnosis not present

## 2017-09-26 DIAGNOSIS — Z23 Encounter for immunization: Secondary | ICD-10-CM | POA: Diagnosis not present

## 2017-09-26 DIAGNOSIS — Z9223 Personal history of estrogen therapy: Secondary | ICD-10-CM | POA: Insufficient documentation

## 2017-09-26 DIAGNOSIS — Z9011 Acquired absence of right breast and nipple: Secondary | ICD-10-CM | POA: Diagnosis not present

## 2017-09-26 DIAGNOSIS — Z9882 Breast implant status: Secondary | ICD-10-CM

## 2017-09-26 DIAGNOSIS — Z90710 Acquired absence of both cervix and uterus: Secondary | ICD-10-CM | POA: Diagnosis not present

## 2017-09-26 DIAGNOSIS — C50211 Malignant neoplasm of upper-inner quadrant of right female breast: Secondary | ICD-10-CM

## 2017-09-26 DIAGNOSIS — Z923 Personal history of irradiation: Secondary | ICD-10-CM | POA: Insufficient documentation

## 2017-09-26 DIAGNOSIS — R61 Generalized hyperhidrosis: Secondary | ICD-10-CM

## 2017-09-26 DIAGNOSIS — Z79811 Long term (current) use of aromatase inhibitors: Secondary | ICD-10-CM | POA: Diagnosis not present

## 2017-09-26 DIAGNOSIS — Z90722 Acquired absence of ovaries, bilateral: Secondary | ICD-10-CM | POA: Insufficient documentation

## 2017-09-26 DIAGNOSIS — Z9221 Personal history of antineoplastic chemotherapy: Secondary | ICD-10-CM | POA: Diagnosis not present

## 2017-09-26 MED ORDER — INFLUENZA VAC SPLIT QUAD 0.5 ML IM SUSY
0.5000 mL | PREFILLED_SYRINGE | Freq: Once | INTRAMUSCULAR | Status: AC
  Administered 2017-09-26: 0.5 mL via INTRAMUSCULAR
  Filled 2017-09-26: qty 0.5

## 2017-09-26 NOTE — Telephone Encounter (Signed)
Printed avs and calender for upcoming appointment. Per 2/19 los 

## 2017-09-26 NOTE — Progress Notes (Signed)
  [facility] OFFICE PROGRESS NOTE   Diagnosis: Breast cancer  INTERVAL HISTORY:   Madeline Chandler returns as scheduled.  She continues Femara.  She reports arthralgias at the wrist and shoulders.  This improves when she takes vitamin D.  No change over either breast.  Good appetite. She underwent a right nipple reconstruction in November 2018.  She has intermittent "sweats "at the upper body at night.  This does not occur frequently.  Objective:  Vital signs in last 24 hours:  Blood pressure (!) 145/76, pulse 80, temperature 98.3 F (36.8 C), temperature source Oral, resp. rate 17, height 5' 5.5" (1.664 m), weight 239 lb 8 oz (108.6 kg), SpO2 97 %.    HEENT: Neck without mass Lymphatics: No cervical, supraclavicular, or axillary nodes Resp: Lungs clear bilaterally Cardio: Regular rate and rhythm GI: No hepatomegaly Vascular: No arm or leg edema Breast: Status post right mastectomy with an implant in place.  Left breast without mass. Musculoskeletal: Hands and wrist without erythema or edema   Medications: I have reviewed the patient's current medications.   Assessment/Plan: 1. Stage II (T2 N0) sees right-sided breast cancer diagnosed in September 1997, status post a lumpectomy, sentinel lymph node biopsy, and right axillary dissectionand radiation followed by adjuvant chemotherapy. She completed 5 years of tamoxifen.  2. 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 Hysterectomy and bilateral oophorectomy April 2008  4. Right breast  implant and left breast reduction 03/07/2017   Disposition: Madeline Chandler remains in clinical remission from breast cancer.  She will continue Femara.  The arthralgias at the wrists and shoulders may be related to Femara.  The arthralgias could also be related to a benign musculoskeletal condition or work related.  She will follow-up with her primary physician if the arthralgias progress.  She will continue Femara.  She will return for an office visit in 9 months.  15 minutes were spent with the patient today.  The majority of the time was used for counseling and coordination of care.  Betsy Coder, MD  09/26/2017  12:10 PM

## 2017-09-27 ENCOUNTER — Telehealth: Payer: Self-pay | Admitting: Oncology

## 2017-09-27 NOTE — Telephone Encounter (Signed)
Vmail full - unable to leave message - scheduled appt per 2/19 sch message - sent reminder letter in the mail with appt date and time.

## 2017-10-09 ENCOUNTER — Other Ambulatory Visit: Payer: Self-pay | Admitting: Family Medicine

## 2017-10-10 ENCOUNTER — Other Ambulatory Visit: Payer: Self-pay | Admitting: Family Medicine

## 2017-10-25 ENCOUNTER — Other Ambulatory Visit: Payer: Self-pay | Admitting: Family Medicine

## 2017-11-08 ENCOUNTER — Other Ambulatory Visit: Payer: Self-pay | Admitting: Family Medicine

## 2017-11-16 ENCOUNTER — Ambulatory Visit (INDEPENDENT_AMBULATORY_CARE_PROVIDER_SITE_OTHER): Payer: 59 | Admitting: Family Medicine

## 2017-11-16 ENCOUNTER — Encounter: Payer: Self-pay | Admitting: Family Medicine

## 2017-11-16 VITALS — BP 148/82 | HR 84 | Temp 98.2°F | Ht 66.0 in | Wt 236.0 lb

## 2017-11-16 DIAGNOSIS — F411 Generalized anxiety disorder: Secondary | ICD-10-CM | POA: Diagnosis not present

## 2017-11-16 DIAGNOSIS — I10 Essential (primary) hypertension: Secondary | ICD-10-CM | POA: Diagnosis not present

## 2017-11-16 DIAGNOSIS — M654 Radial styloid tenosynovitis [de Quervain]: Secondary | ICD-10-CM

## 2017-11-16 MED ORDER — AMLODIPINE BESYLATE 5 MG PO TABS: 5.0000 mg | ORAL_TABLET | Freq: Every day | ORAL | 3 refills | Status: DC

## 2017-11-16 MED ORDER — ESCITALOPRAM OXALATE 10 MG PO TABS: 10.0000 mg | ORAL_TABLET | Freq: Every day | ORAL | 2 refills | Status: DC

## 2017-11-16 MED ORDER — LOSARTAN POTASSIUM-HCTZ 100-25 MG PO TABS: 1.0000 | ORAL_TABLET | Freq: Every day | ORAL | 1 refills | Status: DC

## 2017-11-16 NOTE — Progress Notes (Signed)
Musculoskeletal Exam  Patient: Madeline Chandler DOB: 07-12-1961  DOS: 11/16/2017  SUBJECTIVE:  Chief Complaint:   Chief Complaint  Patient presents with  . Anxiety  . Wrist Pain    Madeline Chandler is a 57 y.o.  female for evaluation and treatment of L and R wrist pain.   Onset:  1 month ago. None.  Location: Over extensor poll brevis Character:  aching and sharp  Progression of issue:  has worsened Associated symptoms: Some swelling Treatment: to date has been Fentanyl patch, generic wrist brace, Icy Hot.   Neurovascular symptoms: no  Hypertension Patient presents for hypertension follow up. She does not routinely monitor home blood pressures, but did recently get a new monitor. She is compliant with medications-Coreg 25 mg twice daily, losartan 100 mg daily, hydrochlorothiazide 25 mg daily. Patient has these side effects of medication: none She is sometimes adhering to a healthy diet overall.  Attention For the past 5-6 mo, has been having issues with concentration while working.  She is on 12.5 mg of Adderall prescribed by her gynecologist.  She feels it is not working.  She also has a history of anxiety/depression.  The last couple years have been particularly difficult regarding her health and breast cancer.  She had 3 surgeries in the past year.  She is on citalopram 40 mg daily and is tolerating well.  She questions its efficacy.  ROS: Musculoskeletal/Extremities: +b/l wrist pain  Past Medical History:  Diagnosis Date  . Anxiety   . Arthritis    "hips" (10/28/2016)  . Breast cancer, right breast ([place]) 1997; 2018   S/P chemo & radiation;   . Bulging lumbar disc    2  . Depression   . GERD (gastroesophageal reflux disease)   . History of colonic polyps    Dr Collene Mares  . Hypertension   . Migraine    "none in the last few years; had them mostly in late 20's/mid 30's" (10/28/2016)  . Pre-diabetes   . Restless leg syndrome    Past Surgical History:  Procedure  Laterality Date  . ABDOMINAL HYSTERECTOMY  2000s  . ANTERIOR CERVICAL DECOMP/DISCECTOMY FUSION N/A 12/02/2015   Procedure: ANTERIOR CERVICAL DECOMPRESSION/DISCECTOMY FUSION C4 - C7  3 LEVELS;  Surgeon: Melina Schools, MD;  Location: [facility];  Service: Orthopedics;  Laterality: N/A;  . BACK SURGERY    . BREAST BIOPSY Right 1997; 08/2016  . BREAST LUMPECTOMY Right 1997  . BREAST RECONSTRUCTION Right 07/07/2017   Procedure: RIGHT NIPPLE AEROLA RECONSTRUCTION WITH LOCAL FLAP FROM RIGHT GROIN;  Surgeon: Irene Limbo, MD;  Location: [facility];  Service: Plastics;  Laterality: Right;  . BREAST REDUCTION SURGERY Left 03/07/2017   Procedure: LEFT BREAST REDUCTION FOR SYMMETRY;  Surgeon: Irene Limbo, MD;  Location: [facility];  Service: Plastics;  Laterality: Left;  . COLONOSCOPY W/ POLYPECTOMY  2010   Dr Collene Mares  . DILATION AND CURETTAGE OF UTERUS  1990s  . JOINT REPLACEMENT    . LATISSIMUS FLAP TO BREAST Right 10/28/2016   Procedure: LATISSIMUS FLAP TO BREAST WITH PLACEMENT OF TISSUE EXPANDER ON THE RIGHT;  Surgeon: Irene Limbo, MD;  Location: [facility];  Service: Plastics;  Laterality: Right;  RFNA  . MASTECTOMY Right 10/28/2016  . RECONSTRUCTION BREAST W/ LATISSIMUS DORSI FLAP Right 10/28/2016    WITH PLACEMENT OF TISSUE EXPANDER   . REMOVAL OF BILATERAL TISSUE EXPANDERS WITH PLACEMENT OF BILATERAL BREAST IMPLANTS Right 03/07/2017   Procedure: REMOVAL OF RIGHT BREAST TISSUE EXPANDER  WITH PLACEMENT OF RIGHT BREAST IMPLANT;  Surgeon: Irene Limbo, MD;  Location: [facility];  Service: Plastics;  Laterality: Right;  . TISSUE EXPANDER PLACEMENT Right 10/28/2016   Procedure: TISSUE EXPANDER;  Surgeon: Irene Limbo, MD;  Location: [facility];  Service: Plastics;  Laterality: Right;  . TOTAL HIP ARTHROPLASTY Right 05/12/2015   Procedure: RIGHT TOTAL HIP ARTHROPLASTY ANTERIOR APPROACH;  Surgeon: Paralee Cancel, MD;  Location: [facility];  Service: Orthopedics;   Laterality: Right;  . TOTAL HIP ARTHROPLASTY Left 06/23/2015   Procedure: LEFT TOTAL HIP ARTHROPLASTY ANTERIOR APPROACH;  Surgeon: Paralee Cancel, MD;  Location: [facility];  Service: Orthopedics;  Laterality: Left;  . TOTAL MASTECTOMY Right 10/28/2016   Procedure: RIGHT MASTECTOMY;  Surgeon: Alphonsa Overall, MD;  Location: [facility];  Service: General;  Laterality: Right;   Allergies as of 11/16/2017   No Known Allergies     Medication List        Accurate as of 11/16/17  5:07 PM. Always use your most recent med list.          amLODipine 5 MG tablet Commonly known as:  NORVASC Take 1 tablet (5 mg total) by mouth daily.   amphetamine-dextroamphetamine 12.5 MG tablet Commonly known as:  ADDERALL Take by mouth.   CALTRATE 600+D 600-800 MG-UNIT Tabs Generic drug:  Calcium Carb-Cholecalciferol Take by mouth daily.   carvedilol 25 MG tablet Commonly known as:  COREG TAKE 1 TABLET (25 MG TOTAL) BY MOUTH 2 (TWO) TIMES DAILY WITH A MEAL.   escitalopram 10 MG tablet Commonly known as:  LEXAPRO Take 1 tablet (10 mg total) by mouth daily.   letrozole 2.5 MG tablet Commonly known as:  FEMARA Take 1 tablet (2.5 mg total) by mouth daily.   losartan-hydrochlorothiazide 100-25 MG tablet Commonly known as:  HYZAAR Take 1 tablet by mouth daily.   pantoprazole 40 MG tablet Commonly known as:  PROTONIX TAKE 1 TABLET BY MOUTH EVERY DAY IN THE EVENING       Objective: VITAL SIGNS: BP (!) 148/82 (BP Location: Left Arm, Patient Position: Sitting, Cuff Size: Large)   Pulse 84   Temp 98.2 F (36.8 C) (Oral)   Ht 5\' 6"  (1.676 m)   Wt 236 lb (107 kg)   SpO2 96%   BMI 38.09 kg/m  Constitutional: Well formed, well developed. No acute distress. Cardiovascular: Brisk cap refill, RRR Thorax & Lungs: No accessory muscle use, CTAB Musculoskeletal: Wrists.   Normal active range of motion: yes.   Normal passive range of motion: yes Tenderness to palpation: yes, over ext poll brevis b/l Deformity:  no Ecchymosis: no Tests positive: Finkelstein's laterally Tests negative: Tinel's over the median nerve Neurologic: Normal sensory function. No focal deficits noted.  Psychiatric: Normal mood. Age appropriate judgment and insight. Alert & oriented x 3.    Assessment:  De Quervain's tenosynovitis, bilateral  Essential hypertension - Plan: losartan-hydrochlorothiazide (HYZAAR) 100-25 MG tablet, amLODipine (NORVASC) 5 MG tablet  Anxiety state - Plan: escitalopram (LEXAPRO) 10 MG tablet  Plan: #1-Thumb spica splints, ice, anti-inflammatories, Tylenol; if no improvement will try injections #2-blood pressures not controlled, will combine losartan hydrochlorothiazide to a combination pill, start amlodipine, continue Coreg.  Counseled on diet and exercise.  Check blood pressures at home, 1 minute apart given her anxiety. #3-stop citalopram, start escitalopram.  This is likely the issue with her concentration problems.  If no improvement, will consider BuSpar versus SNRI. F/u in 1 mo. The patient voiced understanding and agreement to the plan.  Gary, DO 11/16/17  5:04 PM

## 2017-11-16 NOTE — Progress Notes (Signed)
Pre visit review using our clinic review tool, if applicable. No additional management support is needed unless otherwise documented below in the visit note. 

## 2017-11-16 NOTE — Patient Instructions (Addendum)
Thumb spica splints for your wrists. Madeline Chandler has these.   Check your blood pressures at home 3 times per week. Wait 1 minute in between readings and write them down.  For blood pressure, OK to continue hydrochlorothiazide and losartan until you run out.   Keep the diet clean.  Stay active.  OK to take Tylenol 1000 mg (2 extra strength tabs) or 975 mg (3 regular strength tabs) every 6 hours as needed.  Ibuprofen 400-600 mg (2-3 over the counter strength tabs) every 6 hours as needed for pain.  Ice/cold pack over area for 10-15 min twice daily.  OK to continue [facility].  Stop citalopram. Start the escitalopram.   Let us know if you need anything.

## 2017-12-05 ENCOUNTER — Other Ambulatory Visit: Payer: Self-pay | Admitting: Family Medicine

## 2017-12-15 ENCOUNTER — Ambulatory Visit (INDEPENDENT_AMBULATORY_CARE_PROVIDER_SITE_OTHER): Payer: 59 | Admitting: Family Medicine

## 2017-12-15 ENCOUNTER — Encounter: Payer: Self-pay | Admitting: Family Medicine

## 2017-12-15 VITALS — BP 142/84 | HR 90 | Temp 98.9°F | Ht 66.0 in | Wt 233.0 lb

## 2017-12-15 DIAGNOSIS — M654 Radial styloid tenosynovitis [de Quervain]: Secondary | ICD-10-CM

## 2017-12-15 DIAGNOSIS — M25512 Pain in left shoulder: Secondary | ICD-10-CM

## 2017-12-15 DIAGNOSIS — R0683 Snoring: Secondary | ICD-10-CM | POA: Diagnosis not present

## 2017-12-15 DIAGNOSIS — F411 Generalized anxiety disorder: Secondary | ICD-10-CM

## 2017-12-15 DIAGNOSIS — I10 Essential (primary) hypertension: Secondary | ICD-10-CM | POA: Diagnosis not present

## 2017-12-15 MED ORDER — BUSPIRONE HCL 7.5 MG PO TABS: 7.5000 mg | ORAL_TABLET | Freq: Two times a day (BID) | ORAL | 2 refills | Status: DC

## 2017-12-15 MED ORDER — METHYLPREDNISOLONE ACETATE 40 MG/ML IJ SUSP
40.0000 mg | Freq: Once | INTRAMUSCULAR | Status: AC
  Administered 2017-12-15: 40 mg via INTRAMUSCULAR

## 2017-12-15 MED ORDER — METHYLPREDNISOLONE ACETATE 40 MG/ML IJ SUSP
40.0000 mg | Freq: Once | INTRAMUSCULAR | Status: AC
  Administered 2017-12-15: 10 mg via INTRAMUSCULAR

## 2017-12-15 NOTE — Progress Notes (Signed)
Pre visit review using our clinic review tool, if applicable. No additional management support is needed unless otherwise documented below in the visit note. 

## 2017-12-15 NOTE — Patient Instructions (Addendum)
Wear the splints at night.  Ice/cold pack over area for 10-15 min twice daily.  Stop the escitalopram.   If you do not hear anything about your referral in the next 1-2 weeks, call our office and ask for an update.  Stop losartan, start the new Hyzaar combination pill. I think this will bring your blood pressure to the goal range.  Wrist and Forearm Exercises Do exercises exactly as told by your health care provider and adjust them as directed. It is normal to feel mild stretching, pulling, tightness, or discomfort as you do these exercises, but you should stop right away if you feel sudden pain or your pain gets worse.   RANGE OF MOTION EXERCISES These exercises warm up your muscles and joints and improve the movement and flexibility of your injured wrist and forearm. These exercises also help to relieve pain, numbness, and tingling. These exercises are done using the muscles in your injured wrist and forearm. Exercise A: Wrist Flexion, Active 1. With your fingers relaxed, bend your wrist forward as far as you can. 2. Hold this position for 30 seconds. Repeat 2 times. Complete this exercise 3 times per week. Exercise B: Wrist Extension, Active 1. With your fingers relaxed, bend your wrist backward as far as you can. 2. Hold this position for 30 seconds. Repeat 2 times. Complete this exercise 3 times per week. Exercise C: Supination, Active  1. Stand or sit with your arms at your sides. 2. Bend your left / right elbow to an "L" shape (90 degrees). 3. Turn your palm upward until you feel a gentle stretch on the inside of your forearm. 4. Hold this position for 30 seconds. 5. Slowly return your palm to the starting position. Repeat 2 times. Complete this exercise 3 times per week. Exercise D: Pronation, Active  1. Stand or sit with your arms at your sides. 2. Bend your left / right elbow to an "L" shape (90 degrees). 3. Turn your palm downward until you feel a gentle stretch on the  top of your forearm. 4. Hold this position for 30 seconds. 5. Slowly return your palm to the starting position. Repeat 2 times. Complete this exercise once a day.  STRETCHING EXERCISES These exercises warm up your muscles and joints and improve the movement and flexibility of your injured wrist and forearm. These exercises also help to relieve pain, numbness, and tingling. These exercises are done using your healthy wrist and forearm to help stretch the muscles in your injured wrist and forearm. Exercise E: Wrist Flexion, Passive  1. Extend your left / right arm in front of you, relax your wrist, and point your fingers downward. 2. Gently push on the back of your hand. Stop when you feel a gentle stretch on the top of your forearm. 3. Hold this position for 30 seconds. Repeat 2 times. Complete this exercise 3 times per week. Exercise F: Wrist Extension, Passive  1. Extend your left / right arm in front of you and turn your palm upward. 2. Gently pull your palm and fingertips back so your fingers point downward. You should feel a gentle stretch on the palm-side of your forearm. 3. Hold this position for 30 seconds. Repeat 2 times. Complete this exercise 3 times per week. Exercise G: Forearm Rotation, Supination, Passive 1. Sit with your left / right elbow bent to an "L" shape (90 degrees) with your forearm resting on a table. 2. Keeping your upper body and shoulder still, use your other  hand to rotate your forearm palm-up until you feel a gentle to moderate stretch. 3. Hold this position for 30 seconds. 4. Slowly release the stretch and return to the starting position. Repeat 2 times. Complete this exercise 3 times per week. Exercise H: Forearm Rotation, Pronation, Passive 1. Sit with your left / right elbow bent to an "L" shape (90 degrees) with your forearm resting on a table. 2. Keeping your upper body and shoulder still, use your other hand to rotate your forearm palm-down until you  feel a gentle to moderate stretch. 3. Hold this position for 30 seconds. 4. Slowly release the stretch and return to the starting position. Repeat 2 times. Complete this exercise 3 times per week.  STRENGTHENING EXERCISES These exercises build strength and endurance in your wrist and forearm. Endurance is the ability to use your muscles for a long time, even after they get tired. Exercise I: Wrist Flexors  1. Sit with your left / right forearm supported on a table and your hand resting palm-up over the edge of the table. Your elbow should be bent to an "L" shape (about 90 degrees) and be below the level of your shoulder. 2. Hold a 3-5 lb weight in your left / right hand. Or, hold a rubber exercise band or tube in both hands, keeping your hands at the same level and hip distance apart. There should be a slight tension in the exercise band or tube. 3. Slowly curl your hand up toward your forearm. 4. Hold this position for 3 seconds. 5. Slowly lower your hand back to the starting position. Repeat 2 times. Complete this exercise 3 times per week. Exercise J: Wrist Extensors  1. Sit with your left / right forearm supported on a table and your hand resting palm-down over the edge of the table. Your elbow should be bent to an "L" shape (about 90 degrees) and be below the level of your shoulder. 2. Hold a 3-5 lb weight in your left / right hand. Or, hold a rubber exercise band or tube in both hands, keeping your hands at the same level and hip distance apart. There should be a slight tension in the exercise band or tube. 3. Slowly curl your hand up toward your forearm. 4. Hold this position for 3 seconds. 5. Slowly lower your hand back to the starting position. Repeat 2 times. Complete this exercise 3 times per week. Exercise K: Forearm Rotation, Supination  1. Sit with your left / right forearm supported on a table and your hand resting palm-down. Your elbow should be at your side, bent to an "L"  shape (about 90 degrees), and below the level of your shoulder. Keep your wrist stable and in a neutral position throughout the exercise. 2. Gently hold a lightweight hammer with your left / right hand. 3. Without moving your elbow or wrist, slowly rotate your palm upward to a thumbs-up position. 4. Hold this position for 3 seconds. 5. Slowly return your forearm to the starting position. Repeat 2 times. Complete this exercise 3 times per week. Exercise L: Forearm Rotation, Pronation  1. Sit with your left / right forearm supported on a table and your hand resting palm-up. Your elbow should be at your side, bent to an "L" shape (about 90 degrees), and below the level of your shoulder. Keep your wrist stable. Do not allow it to move backward or forward during the exercise. 2. Gently hold a lightweight hammer with your left / right hand.  3. Without moving your elbow or wrist, slowly rotate your palm and hand upward to a thumbs-up position. 4. Hold this position for 3 seconds. 5. Slowly return your forearm to the starting position. Repeat 2 times. Complete this exercise 3 times per week. Exercise M: Grip Strengthening  1. Hold one of these items in your left / right hand: play dough, therapy putty, a dense sponge, a stress ball, or a large, rolled sock. 2. Squeeze as hard as you can without increasing pain. 3. Hold this position for 5 seconds. 4. Slowly release your grip. Repeat 2 times. Complete this exercise 3 times per week.  This information is not intended to replace advice given to you by your health care provider. Make sure you discuss any questions you have with your health care provider. Document Released: 06/08/2005 Document Revised: 04/18/2016 Document Reviewed: 04/19/2015 Elsevier Interactive Patient Education  2018 [place] (ROM) AND STRETCHING EXERCISES These exercises may help you when beginning to rehabilitate your injury. While completing  these exercises, remember:   Restoring tissue flexibility helps normal motion to return to the joints. This allows healthier, less painful movement and activity.  An effective stretch should be held for at least 30 seconds.  A stretch should never be painful. You should only feel a gentle lengthening or release in the stretched tissue.  ROM - Pendulum  Bend at the waist so that your right / left arm falls away from your body. Support yourself with your opposite hand on a solid surface, such as a table or a countertop.  Your right / left arm should be perpendicular to the ground. If it is not perpendicular, you need to lean over farther. Relax the muscles in your right / left arm and shoulder as much as possible.  Gently sway your hips and trunk so they move your right / left arm without any use of your right / left shoulder muscles.  Progress your movements so that your right / left arm moves side to side, then forward and backward, and finally, both clockwise and counterclockwise.  Complete 10-15 repetitions in each direction. Many people use this exercise to relieve discomfort in their shoulder as well as to gain range of motion. Repeat 2 times. Complete this exercise 3 times per week.  STRETCH - Flexion, Standing  Stand with good posture. With an underhand grip on your right / left hand and an overhand grip on the opposite hand, grasp a broomstick or cane so that your hands are a little more than shoulder-width apart.  Keeping your right / left elbow straight and shoulder muscles relaxed, push the stick with your opposite hand to raise your right / left arm in front of your body and then overhead. Raise your arm until you feel a stretch in your right / left shoulder, but before you have increased shoulder pain.  Try to avoid shrugging your right / left shoulder as your arm rises by keeping your shoulder blade tucked down and toward your mid-back spine. Hold 30 seconds.  Slowly return  to the starting position. Repeat 2 times. Complete this exercise 3 times per week.  STRETCH - Internal Rotation  Place your right / left hand behind your back, palm-up.  Throw a towel or belt over your opposite shoulder. Grasp the towel/belt with your right / left hand.  While keeping an upright posture, gently pull up on the towel/belt until you feel a stretch in the front of your  right / left shoulder.  Avoid shrugging your right / left shoulder as your arm rises by keeping your shoulder blade tucked down and toward your mid-back spine.  Hold 30. Release the stretch by lowering your opposite hand. Repeat 2 times. Complete this exercise 3 times per week.  STRETCH - External Rotation and Abduction  Stagger your stance through a doorframe. It does not matter which foot is forward.  As instructed by your physician, physical therapist or athletic trainer, place your hands: ? And forearms above your head and on the door frame. ? And forearms at head-height and on the door frame. ? At elbow-height and on the door frame.  Keeping your head and chest upright and your stomach muscles tight to prevent over-extending your low-back, slowly shift your weight onto your front foot until you feel a stretch across your chest and/or in the front of your shoulders.  Hold 30 seconds. Shift your weight to your back foot to release the stretch. Repeat 2 times. Complete this stretch 3 times per week.   STRENGTHENING EXERCISES  These exercises may help you when beginning to rehabilitate your injury. They may resolve your symptoms with or without further involvement from your physician, physical therapist or athletic trainer. While completing these exercises, remember:   Muscles can gain both the endurance and the strength needed for everyday activities through controlled exercises.  Complete these exercises as instructed by your physician, physical therapist or athletic trainer. Progress the resistance  and repetitions only as guided.  You may experience muscle soreness or fatigue, but the pain or discomfort you are trying to eliminate should never worsen during these exercises. If this pain does worsen, stop and make certain you are following the directions exactly. If the pain is still present after adjustments, discontinue the exercise until you can discuss the trouble with your clinician.  If advised by your physician, during your recovery, avoid activity or exercises which involve actions that place your right / left hand or elbow above your head or behind your back or head. These positions stress the tissues which are trying to heal.  STRENGTH - Scapular Depression and Adduction  With good posture, sit on a firm chair. Supported your arms in front of you with pillows, arm rests or a table top. Have your elbows in line with the sides of your body.  Gently draw your shoulder blades down and toward your mid-back spine. Gradually increase the tension without tensing the muscles along the top of your shoulders and the back of your neck.  Hold for 3 seconds. Slowly release the tension and relax your muscles completely before completing the next repetition.  After you have practiced this exercise, remove the arm support and complete it in standing as well as sitting. Repeat 2 times. Complete this exercise 3 times per week.   STRENGTH - External Rotators  Secure a rubber exercise band/tubing to a fixed object so that it is at the same height as your right / left elbow when you are standing or sitting on a firm surface.  Stand or sit so that the secured exercise band/tubing is at your side that is not injured.  Bend your elbow 90 degrees. Place a folded towel or small pillow under your right / left arm so that your elbow is a few inches away from your side.  Keeping the tension on the exercise band/tubing, pull it away from your body, as if pivoting on your elbow. Be sure to keep your body  steady so that the movement is only coming from your shoulder rotating.  Hold 3 seconds. Release the tension in a controlled manner as you return to the starting position. Repeat 2 times. Complete this exercise 3 times per week.   STRENGTH - Supraspinatus  Stand or sit with good posture. Grasp a 2-3 lb weight or an exercise band/tubing so that your hand is "thumbs-up," like when you shake hands.  Slowly lift your right / left hand from your thigh into the air, traveling about 30 degrees from straight out at your side. Lift your hand to shoulder height or as far as you can without increasing any shoulder pain. Initially, many people do not lift their hands above shoulder height.  Avoid shrugging your right / left shoulder as your arm rises by keeping your shoulder blade tucked down and toward your mid-back spine.  Hold for 3 seconds. Control the descent of your hand as you slowly return to your starting position. Repeat 2 times. Complete this exercise 3 times per week.   STRENGTH - Shoulder Extensors  Secure a rubber exercise band/tubing so that it is at the height of your shoulders when you are either standing or sitting on a firm arm-less chair.  With a thumbs-up grip, grasp an end of the band/tubing in each hand. Straighten your elbows and lift your hands straight in front of you at shoulder height. Step back away from the secured end of band/tubing until it becomes tense.  Squeezing your shoulder blades together, pull your hands down to the sides of your thighs. Do not allow your hands to go behind you.  Hold for 3 seconds. Slowly ease the tension on the band/tubing as you reverse the directions and return to the starting position. Repeat 2 times. Complete this exercise 3 times per week.   STRENGTH - Scapular Retractors  Secure a rubber exercise band/tubing so that it is at the height of your shoulders when you are either standing or sitting on a firm arm-less chair.  With a  palm-down grip, grasp an end of the band/tubing in each hand. Straighten your elbows and lift your hands straight in front of you at shoulder height. Step back away from the secured end of band/tubing until it becomes tense.  Squeezing your shoulder blades together, draw your elbows back as you bend them. Keep your upper arm lifted away from your body throughout the exercise.  Hold 3 seconds. Slowly ease the tension on the band/tubing as you reverse the directions and return to the starting position. Repeat 2 times. Complete this exercise 3 times per week.  STRENGTH - Scapular Depressors  Find a sturdy chair without wheels, such as a from a dining room table.  Keeping your feet on the floor, lift your bottom from the seat and lock your elbows.  Keeping your elbows straight, allow gravity to pull your body weight down. Your shoulders will rise toward your ears.  Raise your body against gravity by drawing your shoulder blades down your back, shortening the distance between your shoulders and ears. Although your feet should always maintain contact with the floor, your feet should progressively support less body weight as you get stronger.  Hold 3 seconds. In a controlled and slow manner, lower your body weight to begin the next repetition. Repeat 2 times. Complete this exercise 3 times per week.   This information is not intended to replace advice given to you by your health care provider. Make sure you discuss any questions  you have with your health care provider.  Document Released: 06/08/2005 Document Revised: 08/15/2014 Document Reviewed: 11/06/2008 Elsevier Interactive Patient Education [employer].

## 2017-12-15 NOTE — Progress Notes (Signed)
Chief Complaint  Patient presents with  . Follow-up    blood pressure  . Wrist Pain    Subjective Madeline Chandler is a 57 y.o. female who presents for hypertension follow up. She does not monitor home blood pressures. She is compliant with medications. Patient has these side effects of medication: none She is not adhering to a healthy diet overall. Current exercise: none  Hx of snoring, told during surgeries for breast that she should have a sleep study. She would like to set this up. No hx of asthma or COPD, she does not smoke. No witnessed apneic episodes.  She continues to have bilateral wrist pain.  Somewhat improved on the right side but still having issues on the left.  She would wear the thumb spica splints during the day, not at night.  She also complains of left shoulder pain.  This is a chronic issue.  Decreased range of motion.  She used to see a specialist, and is wondering if she needs to go back.  She has not noticed much difference with the Lexapro.  No side effects, however no improvement.  She continues to have issues with both anxiety and concentration.   Past Medical History:  Diagnosis Date  . Anxiety   . Arthritis    "hips" (10/28/2016)  . Breast cancer, right breast ([place]) 1997; 2018   S/P chemo & radiation;   . Bulging lumbar disc    2  . Depression   . GERD (gastroesophageal reflux disease)   . History of colonic polyps    Dr Collene Mares  . Hypertension   . Migraine    "none in the last few years; had them mostly in late 20's/mid 30's" (10/28/2016)  . Pre-diabetes   . Restless leg syndrome    Family History  Problem Relation Age of Onset  . Hypertension Mother   . Diabetes Mother   . Breast cancer Mother 25  . Hypertension Sister   . Hypertension Brother        X 3  . Cancer Maternal Aunt        leukemia  . Breast cancer Paternal Aunt        diagnosed in 53s or 31s  . Cancer Paternal Uncle        unknown type  . Cancer Paternal Aunt        unknown  type  . Breast cancer Cousin 21  . Stroke Neg Hx   . Heart disease Neg Hx    Allergies as of 12/15/2017   No Known Allergies     Medication List        Accurate as of 12/15/17  5:05 PM. Always use your most recent med list.          amLODipine 5 MG tablet Commonly known as:  NORVASC Take 1 tablet (5 mg total) by mouth daily.   amphetamine-dextroamphetamine 12.5 MG tablet Commonly known as:  ADDERALL Take by mouth.   busPIRone 7.5 MG tablet Commonly known as:  BUSPAR Take 1 tablet (7.5 mg total) by mouth 2 (two) times daily.   CALTRATE 600+D 600-800 MG-UNIT Tabs Generic drug:  Calcium Carb-Cholecalciferol Take by mouth daily.   carvedilol 25 MG tablet Commonly known as:  COREG TAKE 1 TABLET (25 MG TOTAL) BY MOUTH 2 (TWO) TIMES DAILY WITH A MEAL.   letrozole 2.5 MG tablet Commonly known as:  FEMARA Take 1 tablet (2.5 mg total) by mouth daily.   losartan-hydrochlorothiazide 100-25 MG tablet Commonly known  asKonrad Penta Take 1 tablet by mouth daily.   pantoprazole 40 MG tablet Commonly known as:  PROTONIX TAKE 1 TABLET BY MOUTH EVERY DAY IN THE EVENING       Review of Systems Cardiovascular: no chest pain Respiratory:  no shortness of breath  Exam BP (!) 142/84 (BP Location: Left Arm, Patient Position: Sitting, Cuff Size: Large)   Pulse 90   Temp 98.9 F (37.2 C) (Oral)   Ht 5\' 6"  (1.676 m)   Wt 233 lb (105.7 kg)   SpO2 99%   BMI 37.61 kg/m  General:  well developed, well nourished, in no apparent distress Heart: RRR, no bruits, no LE edema Lungs: clear to auscultation, no accessory muscle use MSK: +ttp over extensor pollicis brevis and longus bilaterally, positive Finkelstein's bilaterally; left shoulder-no tenderness to palpation, positive Neer's and Hawkins; negative liftoff, crossover, speeds Psych: well oriented with normal range of affect and appropriate judgment/insight  Procedure Note; Shoulder bursa injection Consent obtained. The area was  palpated, an area was marked just caudal to the acromion process laterally, and cleaned with alcohol x1. A 27-gauge needle was used to enter the joint laterally with ease. 40 mg of DepoMedrol with 2 mL of 1% lidocaine was injected. A Band-Aid was placed. The patient tolerated the procedure well. There were no complications noted.  Procedure note: De Quervain's tenosynovitis injection Verbal consent obtained. The area is palpated and marked with a pen. Freeze spray was used to anesthetize area. A 27-gauge needle was used to inject parallel along the tendon 10 mg of Depo-Medrol and 1 mL of 1% lidocaine without epinephrine. A Band-Aid was placed. The patient tolerated the procedure well. There were no complications noted.   Essential hypertension  Snoring - Plan: Ambulatory referral to Pulmonology  De Quervain's tenosynovitis, bilateral - Plan: methylPREDNISolone acetate (DEPO-MEDROL) injection 40 mg, PR INJECT TENDON SHEATH/LIGAMENT  Left shoulder pain, unspecified chronicity - Plan: methylPREDNISolone acetate (DEPO-MEDROL) injection 40 mg, PR DRAIN/INJECT LARGE JOINT/BURSA  Anxiety state  Orders as above.  Add HCTZ, stop the regular losartan as she has Hyzaar called in. Counseled on diet and exercise. Refer to pulmonology for OSA evaluation. Continue wearing splints, particularly at night.  Home stretches and exercises.  We will inject today at her request.  Ice.  Suggested occupational therapy, however she declined at this time.  If the injection does not help and she does not wish to see occupational therapy, we will refer to the hand team. Injected her left shoulder.  Home stretches and exercises. Replace Lexapro with BuSpar. F/u in 6 weeks to reck BP. The patient voiced understanding and agreement to the plan.  [place], DO 12/15/17  5:05 PM

## 2018-01-02 ENCOUNTER — Other Ambulatory Visit: Payer: Self-pay | Admitting: Family Medicine

## 2018-01-04 ENCOUNTER — Telehealth: Payer: Self-pay | Admitting: Family Medicine

## 2018-01-04 NOTE — Telephone Encounter (Signed)
Let's try stopping BuSpar and see how she does. I think we should move up her appt sooner as she is having issues. TY.

## 2018-01-04 NOTE — Telephone Encounter (Signed)
Copied from [place] [id number]. Topic: Quick Communication - See Telephone Encounter >> Jan 04, 2018 12:58 PM Boyd Kerbs wrote: CRM for notification. See Telephone encounter for: 01/04/18.  busPIRone (BUSPAR) 7.5 MG tablet amLODipine (NORVASC) 5 MG tablet  She is getting very sleeping and having a hard time with it at work and driving falling a sleep.   She is asking what she should do, as she needs to change a prescription or go off one of these.  She just started one these.

## 2018-01-05 NOTE — Telephone Encounter (Signed)
Called left message to call back 

## 2018-01-05 NOTE — Telephone Encounter (Signed)
Patient advised and agreed. Rescheduled her appt.

## 2018-01-05 NOTE — Telephone Encounter (Signed)
Pt calling Shirlean Mylar back please give her a call back at [phone]

## 2018-01-22 ENCOUNTER — Ambulatory Visit (INDEPENDENT_AMBULATORY_CARE_PROVIDER_SITE_OTHER): Payer: 59 | Admitting: Family Medicine

## 2018-01-22 ENCOUNTER — Encounter: Payer: Self-pay | Admitting: Family Medicine

## 2018-01-22 VITALS — BP 112/72 | HR 92 | Temp 98.3°F | Ht 66.0 in | Wt 228.4 lb

## 2018-01-22 DIAGNOSIS — I10 Essential (primary) hypertension: Secondary | ICD-10-CM

## 2018-01-22 DIAGNOSIS — R7303 Prediabetes: Secondary | ICD-10-CM

## 2018-01-22 DIAGNOSIS — F411 Generalized anxiety disorder: Secondary | ICD-10-CM

## 2018-01-22 DIAGNOSIS — M25561 Pain in right knee: Secondary | ICD-10-CM

## 2018-01-22 MED ORDER — DULOXETINE HCL 30 MG PO CPEP: 30.0000 mg | ORAL_CAPSULE | Freq: Every day | ORAL | 3 refills | Status: DC

## 2018-01-22 NOTE — Patient Instructions (Addendum)
Please consider counseling. Contact [phone] to schedule an appointment or inquire about cost/insurance coverage.  OK to take Tylenol 1000 mg (2 extra strength tabs) or 975 mg (3 regular strength tabs) every 6 hours as needed.   Ibuprofen 400-600 mg (2-3 over the counter strength tabs) every 6 hours as needed for pain. Use this intermittently.   Ice/cold pack over area for 10-15 min twice daily.  Stretching and range of motion exercises These exercises warm up your muscles and joints and improve the movement and flexibility of your knee. These exercises also help to relieve pain and stiffness.  Exercise A: Knee flexion, active 1. Lie on your back with both knees straight. If this causes back discomfort, bend your uninjured knee so your foot is flat on the floor. 2. Slowly slide your left / right heel back toward your buttocks until you feel a gentle stretch in the front of your knee or thigh. Stop if you have pain. 3. Hold for3 seconds. 4. Slowly slide your left / right heel back to the starting position. 10 total repetitions. Repeat 2 times. Complete this exercise 3 times a week.  Exercise B: Knee extension, sitting 1. Sit with your left / right heel propped on a chair, a coffee table, or a footstool. Do not have anything under your knee to support it. 2. Allow your leg muscles to relax, letting gravity straighten out your knee. You should feel a stretch behind your left / right knee. 3. If told by your health care provide just above your kneecap. 4. Hold this position for 3 seconds. 5. Repeat for a total of 10 repetitions. Repeat 2 times. Complete this stretch 3 times a week.  Strengthening exercises These exercises build strength and endurance in your knee. Endurance is the ability to use your muscles for a long time, even after they get tired.  Exercise C: Quadriceps, isometric 1. Lie on your back with your left / right leg extended and your other knee bent. Put a rolled towel  or small pillow under your right/left knee if told by your health care provider. 2. Slowly tense the muscles in the front of your left / right thigh by pushing the back of your knee down. You should see your knee cap slide up toward your hip or see increased dimpling just above the knee. 3. For 3 seconds, keep the muscle as tight as you can without increasing your pain. 4. Relax the muscles slowly and completely. Repeat for 10 total repetitions. Repeat 2 times. Complete this exercise 3 times a week. Exercise D: Straight leg raises (quadriceps) 1. Lie on your back with your left / right leg extended and your other knee bent. 2. Tense the muscles in the front of your left / right thigh. You should see your kneecap slide up or see increased dimpling just above the knee. 3. Keep these muscles tight as you raise your leg 4-6 inches (10-15 cm) off the floor. 4. Hold this position for 3 seconds. 5. Keep these muscles tense as you lower your leg. 6. Relax the muscles slowly and completely. Repeat for a total of 10 repetitions. Repeat 2 times. Complete this exercise 3 times a week.  Exercise E: Hamstring curls 1. On the floor or a bed, lie on your abdomen with your legs straight. Put a folded towel or small pillow under your left / right thigh, just above your kneecap. 2. Slowly bend your left / right knee as far as you can without pain.  Keep your hips flat against the floor or bed. 3. Hold this position for 3 seconds. 4. Slowly lower your leg to the starting position. Repeat for a total of 10 repetitions. Repeat 2 times. Complete this exercise 3 times per week.  Stretching exercises These exercises warm up your muscles and joints and improve the movement and flexibility of your knee. These exercises also help to relieve pain and stiffness.  Exercise A: Quadriceps, prone 1. Lie on your abdomen on a firm surface, such as a bed or padded floor. 2. Bend your left / right knee and hold your ankle. If  you cannot reach your ankle or pant leg, loop a belt around your foot and grab the belt instead. 3. Gently pull your heel toward your buttocks. Your knee should not slide out to the side. You should feel a stretch in the front of your thigh and knee. 4. Hold this position for 30 seconds. Repeat 2 times. Complete this stretch 3 times a week.  Exercise B: Hamstring, doorway 1. Lie on your back in front of a doorway with your left / right leg resting against the wall and your other leg flat on the floor in the doorway. There should be a slight bend in your left / right knee. 2. Straighten your left / right knee. You should feel a stretch behind your knee or thigh. If you do not feel that stretch, scoot your buttocks closer to the door. 3. Hold this position for 30 seconds. Repeat 2 times. Complete this stretch 3 times a week.  Strengthening exercises These exercises build strength and endurance in your knee and leg muscles. Endurance is the ability to use your muscles for a long time, even after they get tired.   Exercise D: Wall slides (quadriceps) 1. Lean your back against a smooth wall or door, and walk your feet out 18-24 inches (45-61 cm) from it. 2. Place your feet hip-width apart. 3. Slowly slide down the wall or door until your knees bend 90 degrees. Keep your knees over your heels, not over your toes. Keep your knees in line with your hips. 4. Hold for 2 seconds. 5. Stand up to rest for 60 seconds. Repeat 2 times. Complete this exercise 3 times a week.  Exercise E: Bridge (hip extensors) 1. Lie on your back on a firm surface with your knees bent and your feet flat on the floor. 2. Tighten your buttocks muscles and lift your bottom off the floor until your trunk is level with your thighs. ? Do not arch your back. ? You should feel the muscles working in your buttocks and the back of your thighs. 3. Hold this position for 2 seconds. 4. Slowly lower your hips to the starting  position. 5. Let your buttocks muscles relax completely between repetitions. Repeat 2 times. Complete this exercise 3 times a week.

## 2018-01-22 NOTE — Progress Notes (Signed)
Pre visit review using our clinic review tool, if applicable. No additional management support is needed unless otherwise documented below in the visit note. 

## 2018-01-22 NOTE — Progress Notes (Signed)
Musculoskeletal Exam  Patient: Madeline Chandler DOB: May 09, 1961  DOS: 01/22/2018  SUBJECTIVE:  Chief Complaint:   Chief Complaint  Patient presents with  . Follow-up  . Knee Pain    left    Madeline Chandler is a 57 y.o.  female for evaluation and treatment of L knee pain.   Onset:  1 week ago. Has been more physically active since moving.  Location: Ant L knee Character:  aching  Progression of issue:  is unchanged Associated symptoms: Radiating up to L hip Treatment: to date has been ice, menthol, Hydrocodone.   Neurovascular symptoms: no  L shoulder and wrist are better after inj.  +hx of prediabetes, would like A1c checked.  Anxiety is no better on BuSpar. Has also failed 2 SSRIs and another SNRI. Is not seeing counselor. No other changes.   Hypertension Patient presents for hypertension follow up. She does monitor home blood pressures. Blood pressures ranging on average from 120-130's/70-80's. She is compliant with medications Hyzaar 100-25 mg/d Patient has these side effects of medication: none She is adhering to a healthy diet overall. Exercise: walking   ROS: Musculoskeletal/Extremities: +L knee pain Heart: No CP  Past Medical History:  Diagnosis Date  . Anxiety   . Arthritis    "hips" (10/28/2016)  . Breast cancer, right breast ([place]) 1997; 2018   S/P chemo & radiation;   . Bulging lumbar disc    2  . Depression   . GERD (gastroesophageal reflux disease)   . History of colonic polyps    Dr Collene Mares  . Hypertension   . Migraine    "none in the last few years; had them mostly in late 20's/mid 30's" (10/28/2016)  . Pre-diabetes   . Restless leg syndrome    Family History  Problem Relation Age of Onset  . Hypertension Mother   . Diabetes Mother   . Breast cancer Mother 40  . Hypertension Sister   . Hypertension Brother        X 3  . Cancer Maternal Aunt        leukemia  . Breast cancer Paternal Aunt        diagnosed in 27s or 36s  . Cancer  Paternal Uncle        unknown type  . Cancer Paternal Aunt        unknown type  . Breast cancer Cousin 53  . Stroke Neg Hx   . Heart disease Neg Hx    Allergies as of 01/22/2018   No Known Allergies     Medication List        Accurate as of 01/22/18  4:44 PM. Always use your most recent med list.          amLODipine 5 MG tablet Commonly known as:  NORVASC Take 1 tablet (5 mg total) by mouth daily.   amphetamine-dextroamphetamine 12.5 MG tablet Commonly known as:  ADDERALL Take by mouth.   CALTRATE 600+D 600-800 MG-UNIT Tabs Generic drug:  Calcium Carb-Cholecalciferol Take by mouth daily.   carvedilol 25 MG tablet Commonly known as:  COREG TAKE 1 TABLET (25 MG TOTAL) BY MOUTH 2 (TWO) TIMES DAILY WITH A MEAL.   DULoxetine 30 MG capsule Commonly known as:  CYMBALTA Take 1 capsule (30 mg total) by mouth daily.   letrozole 2.5 MG tablet Commonly known as:  FEMARA Take 1 tablet (2.5 mg total) by mouth daily.   losartan-hydrochlorothiazide 100-25 MG tablet Commonly known as:  HYZAAR Take 1 tablet  by mouth daily.   pantoprazole 40 MG tablet Commonly known as:  PROTONIX TAKE 1 TABLET BY MOUTH EVERY DAY IN THE EVENING      Objective: VITAL SIGNS: BP 112/72 (BP Location: Left Arm, Patient Position: Sitting, Cuff Size: Large)   Pulse 92   Temp 98.3 F (36.8 C) (Oral)   Ht 5\' 6"  (1.676 m)   Wt 228 lb 6 oz (103.6 kg)   SpO2 98%   BMI 36.86 kg/m  Constitutional: Well formed, well developed. No acute distress. Cardiovascular: RRR, no LE edema Thorax & Lungs: CTAB. No accessory muscle use Musculoskeletal: L knee.   Normal active range of motion: yes.   Normal passive range of motion: yes Tenderness to palpation: no Deformity: no Ecchymosis: no Tests positive: None Tests negative: Lachman's, McMurray's, Stine, varus/valgus, patellar app TTP over L greater troch Neurologic: Normal sensory function.  Psychiatric: Normal mood. Age appropriate judgment and  insight. Alert & oriented x 3.    Assessment:  Anxiety state - Plan: DULoxetine (CYMBALTA) 30 MG capsule  Essential hypertension  Prediabetes - Plan: Hemoglobin A1c  Acute pain of right knee  Plan: Orders as above. Start Cymbalta. # for [facility] given again. Cont BP regimen. Ck A1c. Stretches/exercises, Tylenol, NSAIDs, rest, ice. Could consider bursa inj.  F/u in 6 weeks. The patient voiced understanding and agreement to the plan.   [place], DO 01/22/18  4:44 PM

## 2018-01-23 LAB — HEMOGLOBIN A1C: Hgb A1c MFr Bld: 7 % — ABNORMAL HIGH (ref 4.6–6.5)

## 2018-01-26 ENCOUNTER — Ambulatory Visit (INDEPENDENT_AMBULATORY_CARE_PROVIDER_SITE_OTHER): Payer: 59 | Admitting: Family Medicine

## 2018-01-26 ENCOUNTER — Encounter: Payer: Self-pay | Admitting: Family Medicine

## 2018-01-26 ENCOUNTER — Ambulatory Visit: Payer: 59 | Admitting: Family Medicine

## 2018-01-26 VITALS — BP 118/74 | HR 87 | Temp 98.9°F | Ht 66.0 in | Wt 232.5 lb

## 2018-01-26 DIAGNOSIS — E669 Obesity, unspecified: Secondary | ICD-10-CM

## 2018-01-26 DIAGNOSIS — Z23 Encounter for immunization: Secondary | ICD-10-CM | POA: Diagnosis not present

## 2018-01-26 DIAGNOSIS — E1169 Type 2 diabetes mellitus with other specified complication: Secondary | ICD-10-CM | POA: Diagnosis not present

## 2018-01-26 MED ORDER — ATORVASTATIN CALCIUM 40 MG PO TABS: 40.0000 mg | ORAL_TABLET | Freq: Every day | ORAL | 3 refills | Status: DC

## 2018-01-26 NOTE — Progress Notes (Signed)
Pre visit review using our clinic review tool, if applicable. No additional management support is needed unless otherwise documented below in the visit note. 

## 2018-01-26 NOTE — Patient Instructions (Addendum)
Check your sugars around 2-3 times per week. Alternate checking in the morning before you eat, in the afternoon and before bed. Write them down and bring it to your next appointment.   Call your eye provider to let them know you need a diabetic eye exam.  Keep the diet clean. Stay physically active.  Call your pharmacy to see about the availability of the new shingles vaccine (Shingrix).  Let us know if you need anything.

## 2018-01-26 NOTE — Progress Notes (Signed)
Chief Complaint  Patient presents with  . Follow-up    labs    Subjective: Patient is a 57 y.o. female here for lab review, elevated a1c.  Not on statin. Does not have glucometer. Diet and exercise could be better. Has eye provider. Interested in shingles vaccine. Has never had PCV23.  Past Medical History:  Diagnosis Date  . Anxiety   . Arthritis    "hips" (10/28/2016)  . Breast cancer, right breast ([place]) 1997; 2018   S/P chemo & radiation;   . Bulging lumbar disc    2  . Depression   . GERD (gastroesophageal reflux disease)   . History of colonic polyps    Dr Collene Mares  . Hypertension   . Migraine    "none in the last few years; had them mostly in late 20's/mid 30's" (10/28/2016)  . Pre-diabetes   . Restless leg syndrome     Objective: BP 118/74 (BP Location: Left Arm, Patient Position: Sitting, Cuff Size: Normal)   Pulse 87   Temp 98.9 F (37.2 C) (Oral)   Ht 5\' 6"  (1.676 m)   Wt 232 lb 8 oz (105.5 kg)   SpO2 97%   BMI 37.53 kg/m  General: Awake, appears stated age Skin: no lesions Neuro: sensation intact to pinprick b/l on fet Lungs: No accessory muscle use Psych: Age appropriate judgment and insight, normal affect and mood  Assessment and Plan: Diabetes mellitus type 2 in obese ([place]) - Plan: Microalbumin / creatinine urine ratio, HM Diabetes Foot Exam  Need for vaccination against Streptococcus pneumoniae - Plan: Pneumococcal polysaccharide vaccine 23-valent greater than or equal to 2yo subcutaneous/IM  Orders as above. Let eye provider know about DM dx. Foot exam today. Start statin. Diet and exercise, hold off on Metformin for now. Glucometer given today. F/u as originally scheduled.  The patient voiced understanding and agreement to the plan.  Greater than 15 minutes were spent face to face with the patient with greater than 50% of this time spent counseling on diabetes dx, maintenance, prognosis.   [place], DO 01/26/18  4:58 PM

## 2018-01-29 ENCOUNTER — Other Ambulatory Visit: Payer: Self-pay | Admitting: Family Medicine

## 2018-01-30 ENCOUNTER — Other Ambulatory Visit: Payer: Self-pay | Admitting: *Deleted

## 2018-01-30 DIAGNOSIS — Z170 Estrogen receptor positive status [ER+]: Secondary | ICD-10-CM

## 2018-01-30 DIAGNOSIS — C50211 Malignant neoplasm of upper-inner quadrant of right female breast: Secondary | ICD-10-CM

## 2018-01-30 MED ORDER — LETROZOLE 2.5 MG PO TABS: 2.5000 mg | ORAL_TABLET | Freq: Every day | ORAL | 3 refills | Status: DC

## 2018-02-02 ENCOUNTER — Institutional Professional Consult (permissible substitution): Payer: 59 | Admitting: Pulmonary Disease

## 2018-02-05 ENCOUNTER — Other Ambulatory Visit: Payer: Self-pay | Admitting: Family Medicine

## 2018-02-13 ENCOUNTER — Institutional Professional Consult (permissible substitution): Payer: 59 | Admitting: Pulmonary Disease

## 2018-02-19 ENCOUNTER — Encounter: Payer: Self-pay | Admitting: Pulmonary Disease

## 2018-02-19 ENCOUNTER — Ambulatory Visit (INDEPENDENT_AMBULATORY_CARE_PROVIDER_SITE_OTHER)
Admission: RE | Admit: 2018-02-19 | Discharge: 2018-02-19 | Disposition: A | Discharge status: Home / Self Care | Payer: 59 | Source: Ambulatory Visit | Attending: Pulmonary Disease | Admitting: Pulmonary Disease

## 2018-02-19 ENCOUNTER — Ambulatory Visit (INDEPENDENT_AMBULATORY_CARE_PROVIDER_SITE_OTHER): Payer: 59 | Admitting: Pulmonary Disease

## 2018-02-19 ENCOUNTER — Other Ambulatory Visit (INDEPENDENT_AMBULATORY_CARE_PROVIDER_SITE_OTHER): Payer: 59

## 2018-02-19 VITALS — BP 124/88 | HR 83 | Ht 66.0 in | Wt 230.0 lb

## 2018-02-19 DIAGNOSIS — M62838 Other muscle spasm: Secondary | ICD-10-CM | POA: Diagnosis not present

## 2018-02-19 DIAGNOSIS — R05 Cough: Secondary | ICD-10-CM

## 2018-02-19 DIAGNOSIS — R059 Cough, unspecified: Secondary | ICD-10-CM

## 2018-02-19 DIAGNOSIS — G4733 Obstructive sleep apnea (adult) (pediatric): Secondary | ICD-10-CM

## 2018-02-19 DIAGNOSIS — R053 Chronic cough: Secondary | ICD-10-CM

## 2018-02-19 HISTORY — DX: Obstructive sleep apnea (adult) (pediatric): G47.33

## 2018-02-19 LAB — BASIC METABOLIC PANEL
BUN: 16 mg/dL (ref 6–23)
CALCIUM: 10.3 mg/dL (ref 8.4–10.5)
CO2: 34 mEq/L — ABNORMAL HIGH (ref 19–32)
CREATININE: 1 mg/dL (ref 0.40–1.20)
Chloride: 97 mEq/L (ref 96–112)
GFR: 73.24 mL/min (ref >60.00)
GLUCOSE: 130 mg/dL — AB (ref 70–99)
POTASSIUM: 3.1 meq/L — AB (ref 3.5–5.1)
Sodium: 139 mEq/L (ref 135–145)

## 2018-02-19 LAB — MAGNESIUM: Magnesium: 2 mg/dL (ref 1.5–2.5)

## 2018-02-19 NOTE — Assessment & Plan Note (Addendum)
Given excessive daytime somnolence, narrow pharyngeal exam, witnessed apneas & loud snoring, obstructive sleep apnea is very likely & an overnight polysomnogram will be scheduled as a home study. The pathophysiology of obstructive sleep apnea , it's cardiovascular consequences & modes of treatment including CPAP were discused with the patient in detail & they evidenced understanding.  Pretest probability is intermediate.  She would be willing to use CPAP machine if required

## 2018-02-19 NOTE — Progress Notes (Signed)
Subjective:    Patient ID: Madeline Chandler, female    DOB: 1961-03-31, 57 y.o.   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 She has difficult to control hypertension requiring 3 medications.  She also has anxiety and was initially started on BuSpar but this was changed to Cymbalta.  She reports non-refreshing sleep for many years.  She had 4 surgeries over the past few years and each time he has been told by anesthesia that she may have sleep disordered breathing and she should get checked out.  Loud snoring is been noted by family members.  She has occasionally woken herself up from her sleep with gasping episodes. She works from home and reports excessive daytime fatigue.  Epworth sleepiness score is 17 and she reports sleepiness in various situations such as a passenger in a car, sitting and reading or in the afternoons.  She had difficulty concentrating and was placed on Adderall for the last 6 months.  Bedtime has not fixed, TV stays on through the night, sleep latency is variable and can be 1 to 2 hours, she sleeps on her side with 2-3 pillows, reports 2-3 nocturnal awakenings including nocturia and is out of bed by 7 AM feeling tired without dryness of mouth or headaches.  She feels sleepy again by 9 AM.  She has 1 to 2 cups of coffee in the morning and denies excessive caffeinated beverages during the day.  There is no history suggestive of cataplexy, sleep paralysis or parasomnias  She has bilateral hip surgeries and reports leg tiredness but denies pain or creepy crawly sensations in her legs. She reports nocturnal cramps and would like to get this checked out. 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 Anxiety   .  Arthritis    "hips" (10/28/2016)  . Breast cancer, right breast ([place]) 1997; 2018   S/P chemo & radiation;   . Bulging lumbar disc    2  . Depression   . GERD (gastroesophageal reflux disease)   . History of colonic polyps    Dr Collene Mares  . Hypertension   . Migraine    "none in the last few years; had them mostly in late 20's/mid 30's" (10/28/2016)  . Pre-diabetes   . Restless leg syndrome    Past Surgical History:  Procedure Laterality Date  . ABDOMINAL HYSTERECTOMY  2000s  . ANTERIOR CERVICAL DECOMP/DISCECTOMY FUSION N/A 12/02/2015   Procedure: ANTERIOR CERVICAL DECOMPRESSION/DISCECTOMY FUSION C4 - C7  3 LEVELS;  Surgeon: Melina Schools, MD;  Location: [facility];  Service: Orthopedics;  Laterality: N/A;  . BACK SURGERY    . BREAST BIOPSY Right 1997; 08/2016  . BREAST LUMPECTOMY Right 1997  . BREAST RECONSTRUCTION Right 07/07/2017   Procedure: RIGHT NIPPLE AEROLA RECONSTRUCTION WITH LOCAL FLAP FROM RIGHT GROIN;  Surgeon: Irene Limbo, MD;  Location: [facility];  Service: Plastics;  Laterality: Right;  . BREAST REDUCTION SURGERY Left 03/07/2017   Procedure: LEFT BREAST REDUCTION FOR SYMMETRY;  Surgeon: Irene Limbo, MD;  Location: [place];  Service: Plastics;  Laterality: Left;  . COLONOSCOPY W/ POLYPECTOMY  2010   Dr Collene Mares  . DILATION AND CURETTAGE OF UTERUS  1990s  . JOINT REPLACEMENT    . LATISSIMUS FLAP TO  BREAST Right 10/28/2016   Procedure: LATISSIMUS FLAP TO BREAST WITH PLACEMENT OF TISSUE EXPANDER ON THE RIGHT;  Surgeon: Irene Limbo, MD;  Location: [place];  Service: Plastics;  Laterality: Right;  RFNA  . MASTECTOMY Right 10/28/2016  . RECONSTRUCTION BREAST W/ LATISSIMUS DORSI FLAP Right 10/28/2016    WITH PLACEMENT OF TISSUE EXPANDER   . REMOVAL OF BILATERAL TISSUE EXPANDERS WITH PLACEMENT OF BILATERAL BREAST IMPLANTS Right 03/07/2017   Procedure: REMOVAL OF RIGHT BREAST TISSUE EXPANDER WITH PLACEMENT OF RIGHT BREAST IMPLANT;  Surgeon:  Irene Limbo, MD;  Location: [place];  Service: Plastics;  Laterality: Right;  . TISSUE EXPANDER PLACEMENT Right 10/28/2016   Procedure: TISSUE EXPANDER;  Surgeon: Irene Limbo, MD;  Location: [place];  Service: Plastics;  Laterality: Right;  . TOTAL HIP ARTHROPLASTY Right 05/12/2015   Procedure: RIGHT TOTAL HIP ARTHROPLASTY ANTERIOR APPROACH;  Surgeon: Paralee Cancel, MD;  Location: [facility];  Service: Orthopedics;  Laterality: Right;  . TOTAL HIP ARTHROPLASTY Left 06/23/2015   Procedure: LEFT TOTAL HIP ARTHROPLASTY ANTERIOR APPROACH;  Surgeon: Paralee Cancel, MD;  Location: [facility];  Service: Orthopedics;  Laterality: Left;  . TOTAL MASTECTOMY Right 10/28/2016   Procedure: RIGHT MASTECTOMY;  Surgeon: Alphonsa Overall, MD;  Location: [place];  Service: General;  Laterality: Right;    No Known Allergies   Social History   Socioeconomic History  . Marital status: Single    Spouse name: Not on file  . Number of children: Not on file  . Years of education: Not on file  . Highest education level: Not on file  Occupational History  . Not on file  Social Needs  . Financial resource strain: Not on file  . Food insecurity:    Worry: Not on file    Inability: Not on file  . Transportation needs:    Medical: Not on file    Non-medical: Not on file  Tobacco Use  . Smoking status: Never Smoker  . Smokeless tobacco: Never Used  Substance and Sexual Activity  . Alcohol use: No  . Drug use: No  . Sexual activity: Not Currently  Lifestyle  . Physical activity:    Days per week: Not on file    Minutes per session: Not on file  . Stress: Not on file  Relationships  . Social connections:    Talks on phone: Not on file    Gets together: Not on file    Attends religious service: Not on file    Active member of club or organization: Not on file    Attends meetings of clubs or organizations: Not on file    Relationship status: Not on file  . Intimate partner violence:    Fear  of current or ex partner: Not on file    Emotionally abused: Not on file    Physically abused: Not on file    Forced sexual activity: Not on file  Other Topics Concern  . Not on file  Social History Narrative  . Not on file     Family History  Problem Relation Age of Onset  . Hypertension Mother   . Diabetes Mother   . Breast cancer Mother 72  . Hypertension Sister   . Hypertension Brother        X 3  . Cancer Maternal Aunt        leukemia  . Breast cancer Paternal Aunt        diagnosed in 5s or 24s  . Cancer Paternal  Uncle        unknown type  . Cancer Paternal Aunt        unknown type  . Breast cancer Cousin 82  . Stroke Neg Hx   . Heart disease Neg Hx      Review of Systems Constitutional: negative for anorexia, fevers and sweats  Eyes: negative for irritation, redness and visual disturbance  Ears, nose, mouth, throat, and face: negative for earaches, epistaxis, nasal congestion and sore throat  Respiratory: negative for cough, dyspnea on exertion, sputum and wheezing  Cardiovascular: negative for chest pain, dyspnea, lower extremity edema, orthopnea, palpitations and syncope  Gastrointestinal: negative for abdominal pain, constipation, diarrhea, melena, nausea and vomiting  Genitourinary:negative for dysuria, frequency and hematuria  Hematologic/lymphatic: negative for bleeding, easy bruising and lymphadenopathy  Musculoskeletal:negative for arthralgias, muscle weakness and stiff joints  Neurological: negative for coordination problems, gait problems, headaches and weakness  Endocrine: negative for diabetic symptoms including polydipsia, polyuria and weight loss     Objective:   Physical Exam   Gen. Pleasant, obese, in no distress ENT - class 2 airway, no post nasal drip Neck: No JVD, no thyromegaly, no carotid bruits Lungs: no use of accessory muscles, no dullness to percussion, decreased without rales or rhonchi  Cardiovascular: Rhythm regular, heart  sounds  normal, no murmurs or gallops, no peripheral edema Musculoskeletal: No deformities, no cyanosis or clubbing , no tremors        Assessment & Plan:

## 2018-02-19 NOTE — Assessment & Plan Note (Signed)
Seems to be nocturnal cramps and since she is on diuretics we will check potassium and magnesium level

## 2018-02-19 NOTE — Assessment & Plan Note (Signed)
No obvious postnasal drip or GERD. We will obtain chest x-ray for completion. If symptoms persist can undertake empiric treatment for GERD

## 2018-02-19 NOTE — Patient Instructions (Signed)
Home sleep study Blood work for cramps

## 2018-03-04 ENCOUNTER — Other Ambulatory Visit: Payer: Self-pay | Admitting: Family Medicine

## 2018-03-07 ENCOUNTER — Encounter: Payer: Self-pay | Admitting: Family Medicine

## 2018-03-07 ENCOUNTER — Ambulatory Visit (INDEPENDENT_AMBULATORY_CARE_PROVIDER_SITE_OTHER): Payer: 59 | Admitting: Family Medicine

## 2018-03-07 VITALS — BP 118/78 | HR 95 | Temp 98.8°F | Ht 66.0 in | Wt 232.5 lb

## 2018-03-07 DIAGNOSIS — E669 Obesity, unspecified: Secondary | ICD-10-CM | POA: Diagnosis not present

## 2018-03-07 DIAGNOSIS — E1169 Type 2 diabetes mellitus with other specified complication: Secondary | ICD-10-CM

## 2018-03-07 MED ORDER — GLUCOSE BLOOD VI STRP: ORAL_STRIP | 1 refills | Status: DC

## 2018-03-07 MED ORDER — ONETOUCH LANCETS MISC: 1 refills | Status: DC

## 2018-03-07 NOTE — Progress Notes (Signed)
Subjective:   Chief Complaint  Patient presents with  . Follow-up    Madeline Chandler is a 57 y.o. female here for follow-up of diabetes.    Diet controlled. Discussing what she can eat. Has not been taking sugars routinely. Home readings have been mid-low 100's.  Patient is doing some walking.  Past Medical History:  Diagnosis Date  . Anxiety   . Arthritis    "hips" (10/28/2016)  . Breast cancer, right breast ([place]) 1997; 2018   S/P chemo & radiation;   . Bulging lumbar disc    2  . Depression   . GERD (gastroesophageal reflux disease)   . History of colonic polyps    Dr Collene Mares  . Hypertension   . Migraine    "none in the last few years; had them mostly in late 20's/mid 30's" (10/28/2016)  . OSA (obstructive sleep apnea) 02/19/2018  . Pre-diabetes   . Restless leg syndrome     Review of Systems: Pulmonary:  No SOB Cardiovascular:  No chest pain  Objective:  BP 118/78 (BP Location: Left Arm, Patient Position: Sitting, Cuff Size: Large)   Pulse 95   Temp 98.8 F (37.1 C) (Oral)   Ht 5\' 6"  (1.676 m)   Wt 232 lb 8 oz (105.5 kg)   SpO2 98%   BMI 37.53 kg/m  General:  Well developed, well nourished, in no apparent distress Lungs:  CTAB, no access msc use Cardio:  RRR, no bruits, no LE edema Psych: Age appropriate judgment and insight  Assessment:   Diabetes mellitus type 2 in obese ([place])   Plan:   Check sugars more routinely.  Counseled on diet and exercise. F/u in 2 mo. The patient voiced understanding and agreement to the plan.  Greater than 15 minutes were spent face to face with the patient with greater than 50% of this time spent counseling on diet/exercise, diabetes, maintenance.    [place], DO 03/07/18 5:15 PM

## 2018-03-07 NOTE — Progress Notes (Signed)
Pre visit review using our clinic review tool, if applicable. No additional management support is needed unless otherwise documented below in the visit note. 

## 2018-03-07 NOTE — Patient Instructions (Signed)
Keep the diet clean.  Healthy Eating Plan Many factors influence your heart health, including eating and exercise habits. Heart (coronary) risk increases with abnormal blood fat (lipid) levels. Heart-healthy meal planning includes limiting unhealthy fats, increasing healthy fats, and making other small dietary changes. This includes maintaining a healthy body weight to help keep lipid levels within a normal range.  WHAT IS MY PLAN?  Your health care provider recommends that you:  Drink a glass of water before meals to help with satiety.  Eat slowly.  An alternative to the water is to add Metamucil. This will help with satiety as well. It does contain calories, unlike water.  WHAT TYPES OF FAT SHOULD I CHOOSE?  Choose healthy fats more often. Choose monounsaturated and polyunsaturated fats, such as olive oil and canola oil, flaxseeds, walnuts, almonds, and seeds.  Eat more omega-3 fats. Good choices include salmon, mackerel, sardines, tuna, flaxseed oil, and ground flaxseeds. Aim to eat fish at least two times each week.  Avoid foods with partially hydrogenated oils in them. These contain trans fats. Examples of foods that contain trans fats are stick margarine, some tub margarines, cookies, crackers, and other baked goods. If you are going to avoid a fat, this is the one to avoid!  WHAT GENERAL GUIDELINES DO I NEED TO FOLLOW?  Check food labels carefully to identify foods with trans fats. Avoid these types of options when possible.  Fill one half of your plate with vegetables and green salads. Eat 4-5 servings of vegetables per day. A serving of vegetables equals 1 cup of raw leafy vegetables,  cup of raw or cooked cut-up vegetables, or  cup of vegetable juice.  Fill one fourth of your plate with whole grains. Look for the word "whole" as the first word in the ingredient list.  Fill one fourth of your plate with lean protein foods.  Eat 4-5 servings of fruit per day. A serving of  fruit equals one medium whole fruit,  cup of dried fruit,  cup of fresh, frozen, or canned fruit. Try to avoid fruits in cups/syrups as the sugar content can be high.  Eat more foods that contain soluble fiber. Examples of foods that contain this type of fiber are apples, broccoli, carrots, beans, peas, and barley. Aim to get 20-30 g of fiber per day.  Eat more home-cooked food and less restaurant, buffet, and fast food.  Limit or avoid alcohol.  Limit foods that are high in starch and sugar.  Avoid fried foods when able.  Cook foods by using methods other than frying. Baking, boiling, grilling, and broiling are all great options. Other fat-reducing suggestions include: ? Removing the skin from poultry. ? Removing all visible fats from meats. ? Skimming the fat off of stews, soups, and gravies before serving them. ? Steaming vegetables in water or broth.  Lose weight if you are overweight. Losing just 5-10% of your initial body weight can help your overall health and prevent diseases such as diabetes and heart disease.  Increase your consumption of nuts, legumes, and seeds to 4-5 servings per week. One serving of dried beans or legumes equals  cup after being cooked, one serving of nuts equals 1 ounces, and one serving of seeds equals  ounce or 1 tablespoon.  WHAT ARE GOOD FOODS CAN I EAT? Grains Grainy breads (try to find bread that is 3 g of fiber per slice or greater), oatmeal, light popcorn. Whole-grain cereals. Rice and pasta, including brown rice and those that  are made with whole wheat. Edamame pasta is a great alternative to grain pasta. It has a higher protein content. Try to avoid significant consumption of white bread, sugary cereals, or pastries/baked goods.  Vegetables All vegetables. Cooked white potatoes do not count as vegetables.  Fruits All fruits, but limit pineapple and bananas as these fruits have a higher sugar content.  Meats and Other Protein  Sources Lean, well-trimmed beef, veal, pork, and lamb. Chicken and [place] without skin. All fish and shellfish. Wild duck, rabbit, pheasant, and venison. Egg whites or low-cholesterol egg substitutes. Dried beans, peas, lentils, and tofu.Seeds and most nuts.  Dairy Low-fat or nonfat cheeses, including ricotta, string, and mozzarella. Skim or 1% milk that is liquid, powdered, or evaporated. Buttermilk that is made with low-fat milk. Nonfat or low-fat yogurt. Soy/Almond milk are good alternatives if you cannot handle dairy.  Beverages Water is the best for you. Sports drinks with less sugar are more desirable unless you are a highly active athlete.  Sweets and Desserts Sherbets and fruit ices. Honey, jam, marmalade, jelly, and syrups. Dark chocolate.  Eat all sweets and desserts in moderation.  Fats and Oils Nonhydrogenated (trans-free) margarines. Vegetable oils, including soybean, sesame, sunflower, olive, peanut, safflower, corn, canola, and cottonseed. Salad dressings or mayonnaise that are made with a vegetable oil. Limit added fats and oils that you use for cooking, baking, salads, and as spreads.  Other Cocoa powder. Coffee and tea. Most condiments.  The items listed above may not be a complete list of recommended foods or beverages. Contact your dietitian for more options.

## 2018-03-15 ENCOUNTER — Telehealth: Payer: Self-pay | Admitting: *Deleted

## 2018-03-15 NOTE — Telephone Encounter (Signed)
Received Surgical Pathology Report results from [facility]; forwarded to provider/SLS 08/08

## 2018-03-16 ENCOUNTER — Other Ambulatory Visit: Payer: Self-pay | Admitting: Family Medicine

## 2018-03-16 DIAGNOSIS — I10 Essential (primary) hypertension: Secondary | ICD-10-CM

## 2018-03-18 DIAGNOSIS — G4733 Obstructive sleep apnea (adult) (pediatric): Secondary | ICD-10-CM

## 2018-03-19 ENCOUNTER — Other Ambulatory Visit: Payer: Self-pay | Admitting: *Deleted

## 2018-03-19 DIAGNOSIS — G4733 Obstructive sleep apnea (adult) (pediatric): Secondary | ICD-10-CM

## 2018-03-20 ENCOUNTER — Telehealth: Payer: Self-pay | Admitting: Pulmonary Disease

## 2018-03-20 DIAGNOSIS — G4733 Obstructive sleep apnea (adult) (pediatric): Secondary | ICD-10-CM

## 2018-03-20 NOTE — Telephone Encounter (Signed)
Per RA, HST showed moderate OSA with 15 events per hour. He recommends a RX of auto cpap 5-15cm. OV in 6 weeks.

## 2018-03-29 NOTE — Telephone Encounter (Signed)
Left message to call back for results

## 2018-03-31 ENCOUNTER — Other Ambulatory Visit: Payer: Self-pay | Admitting: Family Medicine

## 2018-04-01 ENCOUNTER — Other Ambulatory Visit: Payer: Self-pay | Admitting: Family Medicine

## 2018-04-05 NOTE — Telephone Encounter (Signed)
Spoke with patient, made aware of results per MD Elsworth Soho. Voiced understanding. Patient agreed to go ahead and place order for Cpap. Order placed. Patient to call office once cpap obtained to set up appt to check compliance. Will route to MD [facility] nurse for follow up. Nothing further needed at this time.

## 2018-04-05 NOTE — Telephone Encounter (Signed)
Patient returned phone call; requesting results; pt contact # [phone]

## 2018-04-12 NOTE — Addendum Note (Signed)
Addended by: Vivia Ewing on: 04/12/2018 04:51 PM   Modules accepted: Orders

## 2018-04-14 NOTE — Telephone Encounter (Signed)
Pl schedule follow up OV with BM/ TP

## 2018-04-27 ENCOUNTER — Other Ambulatory Visit: Payer: Self-pay | Admitting: Family Medicine

## 2018-04-27 ENCOUNTER — Telehealth: Payer: Self-pay | Admitting: Family Medicine

## 2018-04-27 MED ORDER — ONETOUCH DELICA LANCETS FINE MISC: 1 refills | Status: AC

## 2018-04-27 NOTE — Telephone Encounter (Signed)
Copied from [place] [phone]. Topic: Quick Communication - See Telephone Encounter >> Apr 27, 2018  3:31 PM Blase Mess A wrote: CRM for notification. See Telephone encounter for: 04/27/18. Patient is call because she states are the wrong [place] [[id number]] they should be One Touch Delica.   Patient is requesting the One Touch Delica to be called into the Pharmacy.  She has not checked her blood sugar in a few weeks. [id number] - [place], [address] [place] [address] Phone: [phone] Fax: [phone]

## 2018-04-27 NOTE — Telephone Encounter (Signed)
Sent in correct lancets///called the patient to inform/left a message

## 2018-05-02 ENCOUNTER — Encounter: Payer: Self-pay | Admitting: Family Medicine

## 2018-05-02 ENCOUNTER — Ambulatory Visit (INDEPENDENT_AMBULATORY_CARE_PROVIDER_SITE_OTHER): Payer: 59 | Admitting: Family Medicine

## 2018-05-02 VITALS — BP 120/76 | HR 82 | Temp 98.1°F | Resp 16 | Wt 227.0 lb

## 2018-05-02 DIAGNOSIS — E1169 Type 2 diabetes mellitus with other specified complication: Secondary | ICD-10-CM

## 2018-05-02 DIAGNOSIS — Z23 Encounter for immunization: Secondary | ICD-10-CM

## 2018-05-02 DIAGNOSIS — M654 Radial styloid tenosynovitis [de Quervain]: Secondary | ICD-10-CM

## 2018-05-02 DIAGNOSIS — I10 Essential (primary) hypertension: Secondary | ICD-10-CM | POA: Diagnosis not present

## 2018-05-02 DIAGNOSIS — E669 Obesity, unspecified: Secondary | ICD-10-CM

## 2018-05-02 MED ORDER — METHYLPREDNISOLONE ACETATE 40 MG/ML IJ SUSP: 10.0000 mg | Freq: Once | INTRAMUSCULAR | Status: DC

## 2018-05-02 NOTE — Patient Instructions (Addendum)
Ice/cold pack over area for 10-15 min twice daily.  Keep wearing splint.   If no improvement, return to clinic.   Keep the diet clean, stay active.  Have you eye doctor send our office your office notes.   Because your blood pressure is well-controlled, you no longer have to check your blood pressure at home anymore unless you wish. Some people check it twice daily every day and some people stop altogether. Either or anything in between is fine. Strong work!  Let us know if you need anything.

## 2018-05-02 NOTE — Progress Notes (Signed)
Subjective:   Chief Complaint  Patient presents with  . Wrist Pain    Madeline Chandler is a 57 y.o. female here for follow-up of diabetes.   [name] self monitored glucose range is low 100's.  Patient denies hypoglycemic reactions. She checks her glucose levels 2 times per week. Patient does not require insulin.   Medications include: diet controlled Reports diet is healthy overall. Patient exercises 0 days per week on average.    Hypertension Patient presents for hypertension follow up. She does monitor home blood pressures. Blood pressures ranging on average from 120's/70's. She is compliant with medications. Patient has these side effects of medication: none She is adhering to a healthy diet overall. Exercise: none  Hx of DQ tenosynovitis that responded to steroid injection. Starting to hurt again. L side is worse. Is wearing thumb spica splint.    Past Medical History:  Diagnosis Date  . Anxiety   . Arthritis    "hips" (10/28/2016)  . Breast cancer, right breast ([place]) 1997; 2018   S/P chemo & radiation;   . Bulging lumbar disc    2  . Depression   . GERD (gastroesophageal reflux disease)   . History of colonic polyps    Dr Collene Mares  . Hypertension   . Migraine    "none in the last few years; had them mostly in late 20's/mid 30's" (10/28/2016)  . OSA (obstructive sleep apnea) 02/19/2018  . Pre-diabetes   . Restless leg syndrome           Related testing: Date of retinal exam: having done Oct 1 Pneumovax: done Flu Shot: done  Review of Systems: Pulmonary:  No SOB Cardiovascular:  No chest pain  Objective:  BP 120/76   Pulse 82   Temp 98.1 F (36.7 C) (Oral)   Resp 16   Wt 227 lb (103 kg)   SpO2 96%   BMI 36.64 kg/m  General:  Well developed, well nourished, in no apparent distress Skin:  Warm, no pallor or diaphoresis Head:  Normocephalic, atraumatic Eyes:  Pupils equal and round, sclera anicteric without injection  Lungs:  CTAB, no access msc  use Cardio:  RRR, no bruits, no LE edema Musculoskeletal:  Symmetrical muscle groups noted without atrophy or deformity; ttp over lateral wrist tendon on L, +Finkelstein's on L Neuro:  Sensation intact Psych: Age appropriate judgment and insight  Procedure Note; tendon sheath injection Verbal consent obtained. Area was marked and cleaned with alcohol. Freeze spray used. A 27-gauge needle was used to enter parallel along the tendon sheath 10 mg of Depomedrol with 1 mL of 2% lidocaine was injected. The patient tolerated the procedure well. There were no complications noted.   Assessment:   Diabetes mellitus type 2 in obese ([name]) - Plan: Hemoglobin A1c  Need for influenza vaccination - Plan: Flu Vaccine QUAD 6+ mos PF IM (Fluarix Quad PF)  De Quervain's tenosynovitis, bilateral - Plan: PR INJECT TENDON SHEATH/LIGAMENT  Essential hypertension   Plan:   Orders as above. Counseled on diet and exercise. Inj today, ice, stretch, wear splint. If no improvement, will consider occ therapy vs referral to hand. OK to stop checking BP at home. F/u in 3 mo. 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 A cancellation took place so we could see her. The patient voiced understanding and agreement to the plan.  [place], DO 05/02/18 4:48 PM

## 2018-05-03 ENCOUNTER — Encounter: Payer: Self-pay | Admitting: Family Medicine

## 2018-05-03 ENCOUNTER — Ambulatory Visit (INDEPENDENT_AMBULATORY_CARE_PROVIDER_SITE_OTHER): Payer: 59 | Admitting: Family Medicine

## 2018-05-03 VITALS — BP 120/78 | HR 72 | Temp 98.4°F | Resp 14 | Ht 66.0 in | Wt 227.0 lb

## 2018-05-03 DIAGNOSIS — E1169 Type 2 diabetes mellitus with other specified complication: Secondary | ICD-10-CM | POA: Diagnosis not present

## 2018-05-03 DIAGNOSIS — E669 Obesity, unspecified: Secondary | ICD-10-CM

## 2018-05-03 DIAGNOSIS — Z114 Encounter for screening for human immunodeficiency virus [HIV]: Secondary | ICD-10-CM | POA: Diagnosis not present

## 2018-05-03 DIAGNOSIS — Z0000 Encounter for general adult medical examination without abnormal findings: Secondary | ICD-10-CM

## 2018-05-03 DIAGNOSIS — M654 Radial styloid tenosynovitis [de Quervain]: Secondary | ICD-10-CM

## 2018-05-03 LAB — HIV ANTIBODY (ROUTINE TESTING W REFLEX): HIV: NONREACTIVE

## 2018-05-03 LAB — HEMOGLOBIN A1C: Hgb A1c MFr Bld: 7 % — ABNORMAL HIGH (ref 4.6–6.5)

## 2018-05-03 LAB — COMPREHENSIVE METABOLIC PANEL
ALBUMIN: 3.9 g/dL (ref 3.5–5.2)
ALK PHOS: 106 U/L (ref 39–117)
ALT: 16 U/L (ref 0–35)
AST: 15 U/L (ref 0–37)
BUN: 27 mg/dL — AB (ref 6–23)
CO2: 32 mEq/L (ref 19–32)
CREATININE: 1.04 mg/dL (ref 0.40–1.20)
Calcium: 10.1 mg/dL (ref 8.4–10.5)
Chloride: 100 mEq/L (ref 96–112)
GFR: 69.95 mL/min (ref >60.00)
GLUCOSE: 117 mg/dL — AB (ref 70–99)
POTASSIUM: 3.3 meq/L — AB (ref 3.5–5.1)
SODIUM: 139 meq/L (ref 135–145)
TOTAL PROTEIN: 7.7 g/dL (ref 6.0–8.3)
Total Bilirubin: 0.5 mg/dL (ref 0.2–1.2)

## 2018-05-03 LAB — MICROALBUMIN / CREATININE URINE RATIO
CREATININE, U: 93.3 mg/dL
Microalb Creat Ratio: 0.8 mg/g (ref 0.0–30.0)
Microalb, Ur: <0.7 mg/dL (ref 0.0–1.9)

## 2018-05-03 LAB — LIPID PANEL
CHOL/HDL RATIO: 3
Cholesterol: 115 mg/dL (ref 0–200)
HDL: 39.8 mg/dL (ref >39.00)
LDL CALC: 59 mg/dL (ref 0–99)
NONHDL: 74.81
Triglycerides: 80 mg/dL (ref 0.0–149.0)
VLDL: 16 mg/dL (ref 0.0–40.0)

## 2018-05-03 MED ORDER — METHYLPREDNISOLONE 4 MG PO TBPK: ORAL_TABLET | ORAL | 0 refills | Status: DC

## 2018-05-03 MED ORDER — PREDNISONE 20 MG PO TABS: 40.0000 mg | ORAL_TABLET | Freq: Every day | ORAL | 0 refills | Status: DC

## 2018-05-03 NOTE — Progress Notes (Signed)
Chief Complaint  Patient presents with  . Annual Exam     Well Woman Madeline Chandler is here for a complete physical.   Her last physical was >1 year ago.  Current diet: in general, a "healthy" diet. Current exercise: little. Weight is decreasing a little No LMP recorded. Patient has had a hysterectomy..  Seatbelt? Yes  Health Maintenance Mammogram-  Follows with [facility] for breast cancer Tetanus- Yes Hep C screening- Yes HIV screening- No  Past Medical History:  Diagnosis Date  . Anxiety   . Arthritis    "hips" (10/28/2016)  . Breast cancer, right breast ([place]) 1997; 2018   S/P chemo & radiation;   . Bulging lumbar disc    2  . Depression   . GERD (gastroesophageal reflux disease)   . History of colonic polyps    Dr Collene Mares  . Hypertension   . Migraine    "none in the last few years; had them mostly in late 20's/mid 30's" (10/28/2016)  . OSA (obstructive sleep apnea) 02/19/2018  . Pre-diabetes   . Restless leg syndrome      Past Surgical History:  Procedure Laterality Date  . ABDOMINAL HYSTERECTOMY  2000s  . ANTERIOR CERVICAL DECOMP/DISCECTOMY FUSION N/A 12/02/2015   Procedure: ANTERIOR CERVICAL DECOMPRESSION/DISCECTOMY FUSION C4 - C7  3 LEVELS;  Surgeon: Melina Schools, MD;  Location: [facility];  Service: Orthopedics;  Laterality: N/A;  . BACK SURGERY    . BREAST BIOPSY Right 1997; 08/2016  . BREAST LUMPECTOMY Right 1997  . BREAST RECONSTRUCTION Right 07/07/2017   Procedure: RIGHT NIPPLE AEROLA RECONSTRUCTION WITH LOCAL FLAP FROM RIGHT GROIN;  Surgeon: Irene Limbo, MD;  Location: [facility];  Service: Plastics;  Laterality: Right;  . BREAST REDUCTION SURGERY Left 03/07/2017   Procedure: LEFT BREAST REDUCTION FOR SYMMETRY;  Surgeon: Irene Limbo, MD;  Location: [facility];  Service: Plastics;  Laterality: Left;  . COLONOSCOPY W/ POLYPECTOMY  2010   Dr Collene Mares  . DILATION AND CURETTAGE OF UTERUS  1990s  . JOINT REPLACEMENT    . LATISSIMUS  FLAP TO BREAST Right 10/28/2016   Procedure: LATISSIMUS FLAP TO BREAST WITH PLACEMENT OF TISSUE EXPANDER ON THE RIGHT;  Surgeon: Irene Limbo, MD;  Location: [facility];  Service: Plastics;  Laterality: Right;  RFNA  . MASTECTOMY Right 10/28/2016  . RECONSTRUCTION BREAST W/ LATISSIMUS DORSI FLAP Right 10/28/2016    WITH PLACEMENT OF TISSUE EXPANDER   . REMOVAL OF BILATERAL TISSUE EXPANDERS WITH PLACEMENT OF BILATERAL BREAST IMPLANTS Right 03/07/2017   Procedure: REMOVAL OF RIGHT BREAST TISSUE EXPANDER WITH PLACEMENT OF RIGHT BREAST IMPLANT;  Surgeon: Irene Limbo, MD;  Location: [facility];  Service: Plastics;  Laterality: Right;  . TISSUE EXPANDER PLACEMENT Right 10/28/2016   Procedure: TISSUE EXPANDER;  Surgeon: Irene Limbo, MD;  Location: [facility];  Service: Plastics;  Laterality: Right;  . TOTAL HIP ARTHROPLASTY Right 05/12/2015   Procedure: RIGHT TOTAL HIP ARTHROPLASTY ANTERIOR APPROACH;  Surgeon: Paralee Cancel, MD;  Location: [facility];  Service: Orthopedics;  Laterality: Right;  . TOTAL HIP ARTHROPLASTY Left 06/23/2015   Procedure: LEFT TOTAL HIP ARTHROPLASTY ANTERIOR APPROACH;  Surgeon: Paralee Cancel, MD;  Location: [facility];  Service: Orthopedics;  Laterality: Left;  . TOTAL MASTECTOMY Right 10/28/2016   Procedure: RIGHT MASTECTOMY;  Surgeon: Alphonsa Overall, MD;  Location: [facility];  Service: General;  Laterality: Right;    Medications  Current Outpatient Medications on File Prior to Visit  Medication Sig Dispense Refill  .  amLODipine (NORVASC) 5 MG tablet TAKE 1 TABLET BY MOUTH EVERY DAY 30 tablet 3  . amphetamine-dextroamphetamine (ADDERALL) 12.5 MG tablet Take by mouth.    Marland Kitchen atorvastatin (LIPITOR) 40 MG tablet Take 1 tablet (40 mg total) by mouth daily. 90 tablet 3  . Calcium Carb-Cholecalciferol (CALTRATE 600+D) 600-800 MG-UNIT TABS Take by mouth daily.    . carvedilol (COREG) 25 MG tablet TAKE 1 TABLET (25 MG TOTAL) BY MOUTH 2 (TWO) TIMES DAILY WITH A MEAL. 60 tablet 0  .  DULoxetine (CYMBALTA) 30 MG capsule Take 1 capsule (30 mg total) by mouth daily. 30 capsule 3  . glucose blood (ONETOUCH VERIO) test strip Use once daily to check blood sugar.  DX E11.9 100 each 1  . letrozole (FEMARA) 2.5 MG tablet Take 1 tablet (2.5 mg total) by mouth daily. 90 tablet 3  . losartan-hydrochlorothiazide (HYZAAR) 100-25 MG tablet Take 1 tablet by mouth daily. 90 tablet 1  . ONETOUCH DELICA LANCETS FINE MISC Use as directed once daily to check blood sugar.  DXE11.9 100 each 1  . pantoprazole (PROTONIX) 40 MG tablet TAKE 1 TABLET BY MOUTH EVERY DAY IN THE EVENING 30 tablet 0   Allergies No Known Allergies  Review of Systems: Constitutional:  no unexpected weight changes Eye:  no recent significant change in vision Ear/Nose/Mouth/Throat:  Ears:  no tinnitus or vertigo and no recent change in hearing Nose/Mouth/Throat:  no complaints of nasal congestion, no sore throat Cardiovascular: no chest pain Respiratory:  no cough and no shortness of breath Gastrointestinal:  no abdominal pain, no change in bowel habits GU:  Female: negative for dysuria or pelvic pain Musculoskeletal/Extremities: +thumb and shoulder pain on R; otherwise no pain of the joints Integumentary (Skin/Breast):  no abnormal skin lesions reported Neurologic:  no headaches Endocrine:  denies fatigue Hematologic/Lymphatic:  No areas of easy bleeding  Exam BP 120/78 (BP Location: Left Arm, Patient Position: Sitting)   Pulse 72   Temp 98.4 F (36.9 C)   Resp 14   Ht 5\' 6"  (1.676 m)   Wt 227 lb (103 kg)   SpO2 100%   BMI 36.64 kg/m  General:  well developed, well nourished, in no apparent distress Skin:  no significant moles, warts, or growths Head:  no masses, lesions, or tenderness Eyes:  pupils equal and round, sclera anicteric without injection Ears:  canals without lesions, TMs shiny without retraction, no obvious effusion, no erythema Nose:  nares patent, septum midline, mucosa normal, and no  drainage or sinus tenderness Throat/Pharynx:  lips and gingiva without lesion; tongue and uvula midline; non-inflamed pharynx; no exudates or postnasal drainage Neck: neck supple without adenopathy, thyromegaly, or masses Lungs:  clear to auscultation, breath sounds equal bilaterally, no respiratory distress Cardio:  regular rate and rhythm, no bruits, no LE edema Abdomen:  abdomen soft, nontender; bowel sounds normal; no masses or organomegaly Genital: Defer to GYN Musculoskeletal: Still ttp over thumb extensor tendon; symmetrical muscle groups noted without atrophy or deformity Extremities:  no clubbing, cyanosis, or edema, no deformities, no skin discoloration Neuro:  gait normal; deep tendon reflexes normal and symmetric Psych: well oriented with normal range of affect and appropriate judgment/insight  Assessment and Plan  Well adult exam - Plan: Comprehensive metabolic panel, Lipid panel  De Quervain's tenosynovitis, bilateral - Plan: methylPREDNISolone (MEDROL DOSEPAK) 4 MG TBPK tablet  Screening for HIV (human immunodeficiency virus) - Plan: HIV Antibody (routine testing w rflx)  Diabetes mellitus type 2 in obese ([place]) - Plan:  Hemoglobin A1c, Microalbumin / creatinine urine ratio   Well 57 y.o. female. Counseled on diet and exercise. Likely having a steroid flare, will call in dosepak and cont icing.  Other orders as above. Follow up as originally scheduled in Jan. The patient voiced understanding and agreement to the plan.  [place], DO 05/03/18 7:22 AM

## 2018-05-03 NOTE — Patient Instructions (Addendum)
Continue icing your wrist.  OK to take Tylenol 1000 mg (2 extra strength tabs) or 975 mg (3 regular strength tabs) every 6 hours as needed.  Keep the diet clean.   Aim to do some physical exertion for 150 minutes per week. This is typically divided into 5 days per week, 30 minutes per day. The activity should be enough to get your heart rate up. Anything is better than nothing if you have time constraints.  Fx: [phone]  Let us know if you need anything.

## 2018-05-04 ENCOUNTER — Telehealth: Payer: Self-pay

## 2018-05-04 DIAGNOSIS — E876 Hypokalemia: Secondary | ICD-10-CM

## 2018-05-04 NOTE — Telephone Encounter (Signed)
-----   Message from Shelda Pal, DO sent at 05/04/2018 10:19 AM EDT ----- HypoK, DM not improved. Noted. MyChart message sent to patient.  RE- please schedule pt for BMP on the week of the 30th, she does not need to be fasting. TY.

## 2018-05-04 NOTE — Telephone Encounter (Signed)
BMP order placed; author phoned pt, no answer. Author left VM asking for return call to schedule lab appointment for next week. OK for PEC to schedule.

## 2018-05-07 ENCOUNTER — Telehealth: Payer: Self-pay | Admitting: Family Medicine

## 2018-05-07 ENCOUNTER — Ambulatory Visit: Payer: 59 | Admitting: Family Medicine

## 2018-05-07 NOTE — Telephone Encounter (Signed)
Copied from [place] [phone]. Topic: Quick Communication - See Telephone Encounter >> May 07, 2018  2:28 PM Rosalin Hawking wrote: CRM for notification. See Telephone encounter for: 05/07/18.    Pt dropped off document to be filled out by provider [facility] physician form- 1 page) Pt would like document to be faxed to [phone]. Pt stated that provider would fax document same day that document was brought in. (pt was explain it take 5-7 business days) Document give to [place].

## 2018-05-08 ENCOUNTER — Ambulatory Visit (INDEPENDENT_AMBULATORY_CARE_PROVIDER_SITE_OTHER): Payer: 59 | Admitting: Family Medicine

## 2018-05-08 ENCOUNTER — Encounter: Payer: Self-pay | Admitting: Family Medicine

## 2018-05-08 VITALS — BP 122/66 | HR 84 | Temp 98.4°F | Resp 18 | Wt 227.4 lb

## 2018-05-08 DIAGNOSIS — J029 Acute pharyngitis, unspecified: Secondary | ICD-10-CM | POA: Diagnosis not present

## 2018-05-08 DIAGNOSIS — J069 Acute upper respiratory infection, unspecified: Secondary | ICD-10-CM

## 2018-05-08 LAB — HM DIABETES EYE EXAM

## 2018-05-08 MED ORDER — AMOXICILLIN 500 MG PO CAPS: 500.0000 mg | ORAL_CAPSULE | Freq: Three times a day (TID) | ORAL | 0 refills | Status: DC

## 2018-05-08 NOTE — Addendum Note (Signed)
Addended by: Magdalene Molly A on: 05/08/2018 10:23 AM   Modules accepted: Orders

## 2018-05-08 NOTE — Progress Notes (Signed)
Subjective:    Patient ID: Madeline Chandler, female    DOB: 08-27-1960, 57 y.o.   MRN: [id number]  No chief complaint on file.   HPI Patient is in today for evaluation of arespiratory infection. She had sudden onset of cough, head congestion, headache, myalgias, fatigue, pnd, n/v 4 days ago. She had chills and low grade fevers but they have resolved. Nausea persists but vomiting has stopped. She denies any obvious sick contacts. Denies CP/palp/SOB or GU c/o. Taking meds as prescribed  Past Medical History:  Diagnosis Date  . Anxiety   . Arthritis    "hips" (10/28/2016)  . Breast cancer, right breast ([facility]) 1997; 2018   S/P chemo & radiation;   . Bulging lumbar disc    2  . Depression   . GERD (gastroesophageal reflux disease)   . History of colonic polyps    Dr Collene Mares  . Hypertension   . Migraine    "none in the last few years; had them mostly in late 20's/mid 30's" (10/28/2016)  . OSA (obstructive sleep apnea) 02/19/2018  . Pre-diabetes   . Restless leg syndrome     Past Surgical History:  Procedure Laterality Date  . ABDOMINAL HYSTERECTOMY  2000s  . ANTERIOR CERVICAL DECOMP/DISCECTOMY FUSION N/A 12/02/2015   Procedure: ANTERIOR CERVICAL DECOMPRESSION/DISCECTOMY FUSION C4 - C7  3 LEVELS;  Surgeon: Melina Schools, MD;  Location: [facility];  Service: Orthopedics;  Laterality: N/A;  . BACK SURGERY    . BREAST BIOPSY Right 1997; 08/2016  . BREAST LUMPECTOMY Right 1997  . BREAST RECONSTRUCTION Right 07/07/2017   Procedure: RIGHT NIPPLE AEROLA RECONSTRUCTION WITH LOCAL FLAP FROM RIGHT GROIN;  Surgeon: Irene Limbo, MD;  Location: [facility];  Service: Plastics;  Laterality: Right;  . BREAST REDUCTION SURGERY Left 03/07/2017   Procedure: LEFT BREAST REDUCTION FOR SYMMETRY;  Surgeon: Irene Limbo, MD;  Location: [facility];  Service: Plastics;  Laterality: Left;  . COLONOSCOPY W/ POLYPECTOMY  2010   Dr Collene Mares  . DILATION AND CURETTAGE OF UTERUS  1990s    . JOINT REPLACEMENT    . LATISSIMUS FLAP TO BREAST Right 10/28/2016   Procedure: LATISSIMUS FLAP TO BREAST WITH PLACEMENT OF TISSUE EXPANDER ON THE RIGHT;  Surgeon: Irene Limbo, MD;  Location: [facility];  Service: Plastics;  Laterality: Right;  RFNA  . MASTECTOMY Right 10/28/2016  . RECONSTRUCTION BREAST W/ LATISSIMUS DORSI FLAP Right 10/28/2016    WITH PLACEMENT OF TISSUE EXPANDER   . REMOVAL OF BILATERAL TISSUE EXPANDERS WITH PLACEMENT OF BILATERAL BREAST IMPLANTS Right 03/07/2017   Procedure: REMOVAL OF RIGHT BREAST TISSUE EXPANDER WITH PLACEMENT OF RIGHT BREAST IMPLANT;  Surgeon: Irene Limbo, MD;  Location: [facility];  Service: Plastics;  Laterality: Right;  . TISSUE EXPANDER PLACEMENT Right 10/28/2016   Procedure: TISSUE EXPANDER;  Surgeon: Irene Limbo, MD;  Location: [facility];  Service: Plastics;  Laterality: Right;  . TOTAL HIP ARTHROPLASTY Right 05/12/2015   Procedure: RIGHT TOTAL HIP ARTHROPLASTY ANTERIOR APPROACH;  Surgeon: Paralee Cancel, MD;  Location: [facility];  Service: Orthopedics;  Laterality: Right;  . TOTAL HIP ARTHROPLASTY Left 06/23/2015   Procedure: LEFT TOTAL HIP ARTHROPLASTY ANTERIOR APPROACH;  Surgeon: Paralee Cancel, MD;  Location: [facility];  Service: Orthopedics;  Laterality: Left;  . TOTAL MASTECTOMY Right 10/28/2016   Procedure: RIGHT MASTECTOMY;  Surgeon: Alphonsa Overall, MD;  Location: [facility];  Service: General;  Laterality: Right;    Family History  Problem Relation Age of Onset  .  Hypertension Mother   . Diabetes Mother   . Breast cancer Mother 79  . Hypertension Sister   . Hypertension Brother        X 3  . Cancer Maternal Aunt        leukemia  . Breast cancer Paternal Aunt        diagnosed in 72s or 47s  . Cancer Paternal Uncle        unknown type  . Cancer Paternal Aunt        unknown type  . Breast cancer Cousin 23  . Stroke Neg Hx   . Heart disease Neg Hx     Social History   Socioeconomic History  . Marital status: Single     Spouse name: Not on file  . Number of children: Not on file  . Years of education: Not on file  . Highest education level: Not on file  Occupational History  . Not on file  Social Needs  . Financial resource strain: Not on file  . Food insecurity:    Worry: Not on file    Inability: Not on file  . Transportation needs:    Medical: Not on file    Non-medical: Not on file  Tobacco Use  . Smoking status: Never Smoker  . Smokeless tobacco: Never Used  Substance and Sexual Activity  . Alcohol use: No  . Drug use: No  . Sexual activity: Not Currently  Lifestyle  . Physical activity:    Days per week: Not on file    Minutes per session: Not on file  . Stress: Not on file  Relationships  . Social connections:    Talks on phone: Not on file    Gets together: Not on file    Attends religious service: Not on file    Active member of club or organization: Not on file    Attends meetings of clubs or organizations: Not on file    Relationship status: Not on file  . Intimate partner violence:    Fear of current or ex partner: Not on file    Emotionally abused: Not on file    Physically abused: Not on file    Forced sexual activity: Not on file  Other Topics Concern  . Not on file  Social History Narrative  . Not on file    Outpatient Medications Prior to Visit  Medication Sig Dispense Refill  . amLODipine (NORVASC) 5 MG tablet TAKE 1 TABLET BY MOUTH EVERY DAY 30 tablet 3  . amphetamine-dextroamphetamine (ADDERALL) 12.5 MG tablet Take by mouth.    Marland Kitchen atorvastatin (LIPITOR) 40 MG tablet Take 1 tablet (40 mg total) by mouth daily. 90 tablet 3  . Calcium Carb-Cholecalciferol (CALTRATE 600+D) 600-800 MG-UNIT TABS Take by mouth daily.    . carvedilol (COREG) 25 MG tablet TAKE 1 TABLET (25 MG TOTAL) BY MOUTH 2 (TWO) TIMES DAILY WITH A MEAL. 60 tablet 0  . DULoxetine (CYMBALTA) 30 MG capsule Take 1 capsule (30 mg total) by mouth daily. 30 capsule 3  . glucose blood (ONETOUCH VERIO) test  strip Use once daily to check blood sugar.  DX E11.9 100 each 1  . letrozole (FEMARA) 2.5 MG tablet Take 1 tablet (2.5 mg total) by mouth daily. 90 tablet 3  . losartan-hydrochlorothiazide (HYZAAR) 100-25 MG tablet Take 1 tablet by mouth daily. 90 tablet 1  . methylPREDNISolone (MEDROL DOSEPAK) 4 MG TBPK tablet Follow instructions on package. 21 tablet 0  . ONETOUCH DELICA LANCETS  FINE MISC Use as directed once daily to check blood sugar.  DXE11.9 100 each 1  . pantoprazole (PROTONIX) 40 MG tablet TAKE 1 TABLET BY MOUTH EVERY DAY IN THE EVENING 30 tablet 0   No facility-administered medications prior to visit.     No Known Allergies  Review of Systems  Constitutional: Negative for fever.  HENT: Positive for congestion.   Eyes: Negative for blurred vision.  Respiratory: Positive for cough and shortness of breath.   Cardiovascular: Negative for chest pain, palpitations and leg swelling.  Gastrointestinal: Positive for nausea and vomiting. Negative for abdominal pain and blood in stool.  Genitourinary: Negative for dysuria and frequency.  Musculoskeletal: Positive for myalgias. Negative for falls.  Skin: Negative for rash.  Neurological: Negative for dizziness, loss of consciousness and headaches.  Endo/Heme/Allergies: Negative for environmental allergies.  Psychiatric/Behavioral: Negative for depression. The patient is not nervous/anxious.        Objective:    Physical Exam  Constitutional: She is oriented to person, place, and time. She appears well-developed and well-nourished. No distress.  HENT:  Head: Normocephalic and atraumatic.  Nose: Nose normal.  Nasal mucosa boggy and erythematous  Eyes: Right eye exhibits no discharge. Left eye exhibits no discharge.  Neck: Normal range of motion. Neck supple.  Cardiovascular: Normal rate and regular rhythm.  No murmur heard. Pulmonary/Chest: Effort normal and breath sounds normal.  Abdominal: Soft. Bowel sounds are normal. There is  no tenderness.  Musculoskeletal: She exhibits no edema.  Neurological: She is alert and oriented to person, place, and time.  Skin: Skin is warm and dry.  Psychiatric: She has a normal mood and affect.  Nursing note and vitals reviewed.   BP 122/66 (BP Location: Left Arm, Patient Position: Sitting, Cuff Size: Normal)   Pulse 84   Temp 98.4 F (36.9 C) (Oral)   Resp 18   Wt 227 lb 6.4 oz (103.1 kg)   SpO2 97%   BMI 36.70 kg/m  Wt Readings from Last 3 Encounters:  05/08/18 227 lb 6.4 oz (103.1 kg)  05/03/18 227 lb (103 kg)  05/02/18 227 lb (103 kg)     Lab Results  Component Value Date   WBC 6.5 10/21/2016   HGB 12.5 10/21/2016   HCT 38.7 10/21/2016   PLT 221 10/21/2016   GLUCOSE 117 (H) 05/03/2018   CHOL 115 05/03/2018   TRIG 80.0 05/03/2018   HDL 39.80 05/03/2018   LDLCALC 59 05/03/2018   ALT 16 05/03/2018   AST 15 05/03/2018   NA 139 05/03/2018   K 3.3 (L) 05/03/2018   CL 100 05/03/2018   CREATININE 1.04 05/03/2018   BUN 27 (H) 05/03/2018   CO2 32 05/03/2018   TSH 1.16 04/03/2015   INR 1.0 11/04/2015   HGBA1C 7.0 (H) 05/03/2018   MICROALBUR <0.7 05/03/2018    Lab Results  Component Value Date   TSH 1.16 04/03/2015   Lab Results  Component Value Date   WBC 6.5 10/21/2016   HGB 12.5 10/21/2016   HCT 38.7 10/21/2016   MCV 87.4 10/21/2016   PLT 221 10/21/2016   Lab Results  Component Value Date   NA 139 05/03/2018   K 3.3 (L) 05/03/2018   CO2 32 05/03/2018   GLUCOSE 117 (H) 05/03/2018   BUN 27 (H) 05/03/2018   CREATININE 1.04 05/03/2018   BILITOT 0.5 05/03/2018   ALKPHOS 106 05/03/2018   AST 15 05/03/2018   ALT 16 05/03/2018   PROT 7.7 05/03/2018   ALBUMIN  3.9 05/03/2018   CALCIUM 10.1 05/03/2018   ANIONGAP 6 07/05/2017   GFR 69.95 05/03/2018   Lab Results  Component Value Date   CHOL 115 05/03/2018   Lab Results  Component Value Date   HDL 39.80 05/03/2018   Lab Results  Component Value Date   LDLCALC 59 05/03/2018   Lab Results   Component Value Date   TRIG 80.0 05/03/2018   Lab Results  Component Value Date   CHOLHDL 3 05/03/2018   Lab Results  Component Value Date   HGBA1C 7.0 (H) 05/03/2018       Assessment & Plan:   Problem List Items Addressed This Visit    Upper respiratory infection    Likely viral but flu test negative. Sent out strep culture. Encouraged increased rest and hydration, add probiotics, zinc such as Coldeze or Xicam. Treat fevers as needed. Elderberry, vitamin c mucinex, if symptoms persist or worsen with fevers is given Amoxicillin she can take.          I am having [name] start on amoxicillin. I am also having her maintain her Calcium Carb-Cholecalciferol, amphetamine-dextroamphetamine, losartan-hydrochlorothiazide, DULoxetine, atorvastatin, letrozole, glucose blood, amLODipine, carvedilol, pantoprazole, ONETOUCH DELICA LANCETS FINE, and methylPREDNISolone.  Meds ordered this encounter  Medications  . amoxicillin (AMOXIL) 500 MG capsule    Sig: Take 1 capsule (500 mg total) by mouth 3 (three) times daily.    Dispense:  30 capsule    Refill:  0     Penni Homans, MD

## 2018-05-08 NOTE — Assessment & Plan Note (Signed)
Likely viral but flu test negative. Sent out strep culture. Encouraged increased rest and hydration, add probiotics, zinc such as Coldeze or Xicam. Treat fevers as needed. Elderberry, vitamin c mucinex, if symptoms persist or worsen with fevers is given Amoxicillin she can take.

## 2018-05-08 NOTE — Patient Instructions (Signed)
Encouraged increased rest and hydration, add probiotics, zinc such as Coldeze or Xicam. Treat fevers as needed. Mucinex twice a day. Vitamin C 500 to 1000 mg daily, elderberry.  Upper Respiratory Infection, Adult Most upper respiratory infections (URIs) are caused by a virus. A URI affects the nose, throat, and upper air passages. The most common type of URI is often called "the common cold." Follow these instructions at home:  Take medicines only as told by your doctor.  Gargle warm saltwater or take cough drops to comfort your throat as told by your doctor.  Use a warm mist humidifier or inhale steam from a shower to increase air moisture. This may make it easier to breathe.  Drink enough fluid to keep your pee (urine) clear or pale yellow.  Eat soups and other clear broths.  Have a healthy diet.  Rest as needed.  Go back to work when your fever is gone or your doctor says it is okay. ? You may need to stay home longer to avoid giving your URI to others. ? You can also wear a face mask and wash your hands often to prevent spread of the virus.  Use your inhaler more if you have asthma.  Do not use any tobacco products, including cigarettes, chewing tobacco, or electronic cigarettes. If you need help quitting, ask your doctor. Contact a doctor if:  You are getting worse, not better.  Your symptoms are not helped by medicine.  You have chills.  You are getting more short of breath.  You have brown or red mucus.  You have yellow or brown discharge from your nose.  You have pain in your face, especially when you bend forward.  You have a fever.  You have puffy (swollen) neck glands.  You have pain while swallowing.  You have white areas in the back of your throat. Get help right away if:  You have very bad or constant: ? Headache. ? Ear pain. ? Pain in your forehead, behind your eyes, and over your cheekbones (sinus pain). ? Chest pain.  You have long-lasting  (chronic) lung disease and any of the following: ? Wheezing. ? Long-lasting cough. ? Coughing up blood. ? A change in your usual mucus.  You have a stiff neck.  You have changes in your: ? Vision. ? Hearing. ? Thinking. ? Mood. This information is not intended to replace advice given to you by your health care provider. Make sure you discuss any questions you have with your health care provider. Document Released: 01/11/2008 Document Revised: 03/27/2016 Document Reviewed: 10/30/2013 Elsevier Interactive Patient Education  2018 [employer].

## 2018-05-08 NOTE — Assessment & Plan Note (Signed)
Well controlled, no changes to meds. Encouraged heart healthy diet such as the DASH diet and exercise as tolerated.  °

## 2018-05-09 LAB — CULTURE, GROUP A STREP
MICRO NUMBER: 91177497
SPECIMEN QUALITY: ADEQUATE

## 2018-05-10 ENCOUNTER — Other Ambulatory Visit: Payer: Self-pay | Admitting: Family Medicine

## 2018-05-10 DIAGNOSIS — F411 Generalized anxiety disorder: Secondary | ICD-10-CM

## 2018-05-16 ENCOUNTER — Encounter: Payer: Self-pay | Admitting: Family Medicine

## 2018-05-20 ENCOUNTER — Other Ambulatory Visit: Payer: Self-pay | Admitting: Family Medicine

## 2018-05-20 DIAGNOSIS — I10 Essential (primary) hypertension: Secondary | ICD-10-CM

## 2018-05-26 ENCOUNTER — Other Ambulatory Visit: Payer: Self-pay | Admitting: Family Medicine

## 2018-06-18 ENCOUNTER — Encounter: Payer: Self-pay | Admitting: Family Medicine

## 2018-06-18 ENCOUNTER — Ambulatory Visit (INDEPENDENT_AMBULATORY_CARE_PROVIDER_SITE_OTHER): Payer: 59 | Admitting: Family Medicine

## 2018-06-18 VITALS — BP 122/72 | HR 61 | Temp 98.6°F | Ht 66.0 in | Wt 234.0 lb

## 2018-06-18 DIAGNOSIS — M25532 Pain in left wrist: Secondary | ICD-10-CM | POA: Diagnosis not present

## 2018-06-18 MED ORDER — MELOXICAM 15 MG PO TABS: 15.0000 mg | ORAL_TABLET | Freq: Every day | ORAL | 0 refills | Status: DC

## 2018-06-18 NOTE — Patient Instructions (Addendum)
Ice/cold pack over area for 10-15 min twice daily.  Try to do those exercises. Continue wearing the splints.   If you do not hear anything about your referral in the next 1-2 weeks, call our office and ask for an update.  OK to take Tylenol 1000 mg (2 extra strength tabs) or 975 mg (3 regular strength tabs) every 6 hours as needed.  Let us know if you need anything.

## 2018-06-18 NOTE — Progress Notes (Signed)
Pre visit review using our clinic review tool, if applicable. No additional management support is needed unless otherwise documented below in the visit note. 

## 2018-06-18 NOTE — Progress Notes (Signed)
Musculoskeletal Exam  Patient: Madeline Chandler DOB: Mar 09, 1961  DOS: 06/18/2018  SUBJECTIVE:  Chief Complaint:   Chief Complaint  Patient presents with  . Wrist Pain    left    Madeline Chandler is a 57 y.o.  female for evaluation and treatment of L wrist pain.   Onset:  Has been spreading with pain and getting more severe Location: dorsum of wrist and thumb Character:  aching and sharp  Progression of issue:  has worsened Associated symptoms: decreased ROM, swelling?, no bruising or redness Treatment: to date has been rest, ice, exercises, injections, bracing.   Neurovascular symptoms: no  ROS: Musculoskeletal/Extremities: +L wrist pain  Past Medical History:  Diagnosis Date  . Anxiety   . Arthritis    "hips" (10/28/2016)  . Breast cancer, right breast ([facility]) 1997; 2018   S/P chemo & radiation;   . Bulging lumbar disc    2  . Depression   . GERD (gastroesophageal reflux disease)   . History of colonic polyps    Dr Collene Mares  . Hypertension   . Migraine    "none in the last few years; had them mostly in late 20's/mid 30's" (10/28/2016)  . OSA (obstructive sleep apnea) 02/19/2018  . Pre-diabetes   . Restless leg syndrome     Objective: VITAL SIGNS: BP 122/72 (BP Location: Left Arm, Patient Position: Sitting, Cuff Size: Large)   Pulse 61   Temp 98.6 F (37 C) (Oral)   Ht 5\' 6"  (1.676 m)   Wt 234 lb (106.1 kg)   SpO2 98%   BMI 37.77 kg/m  Constitutional: Well formed, well developed. No acute distress. Cardiovascular: Brisk cap refill Thorax & Lungs: No accessory muscle use Musculoskeletal: L wrist.   Normal active range of motion: yes.   Normal passive range of motion: yes Tenderness to palpation: mild ttp over dorsum of wrist, +ttp over thumb extensors Deformity: no Ecchymosis: no Tests positive: Finkelstein's Neurologic: Normal sensory function.  Psychiatric: Normal mood. Age appropriate judgment and insight. Alert & oriented x 3.    Assessment:  Left  wrist pain - Plan: meloxicam (MOBIC) 15 MG tablet, Ambulatory referral to Occupational Therapy  Plan: Orders as above. Ice. Cont bracing. Tylenol.  F/u prn. The patient voiced understanding and agreement to the plan.   [place], DO 06/18/18  3:27 PM

## 2018-06-19 ENCOUNTER — Other Ambulatory Visit: Payer: Self-pay | Admitting: Family Medicine

## 2018-06-28 ENCOUNTER — Telehealth: Payer: Self-pay | Admitting: Oncology

## 2018-06-28 ENCOUNTER — Inpatient Hospital Stay: Payer: 59 | Attending: Oncology | Admitting: Oncology

## 2018-06-28 VITALS — BP 139/85 | HR 87 | Temp 98.4°F | Resp 18 | Wt 238.2 lb

## 2018-06-28 DIAGNOSIS — C50211 Malignant neoplasm of upper-inner quadrant of right female breast: Secondary | ICD-10-CM | POA: Diagnosis present

## 2018-06-28 DIAGNOSIS — Z79811 Long term (current) use of aromatase inhibitors: Secondary | ICD-10-CM | POA: Diagnosis not present

## 2018-06-28 DIAGNOSIS — C50911 Malignant neoplasm of unspecified site of right female breast: Secondary | ICD-10-CM

## 2018-06-28 DIAGNOSIS — Z923 Personal history of irradiation: Secondary | ICD-10-CM | POA: Diagnosis not present

## 2018-06-28 DIAGNOSIS — Z9011 Acquired absence of right breast and nipple: Secondary | ICD-10-CM | POA: Insufficient documentation

## 2018-06-28 DIAGNOSIS — Z9882 Breast implant status: Secondary | ICD-10-CM | POA: Diagnosis not present

## 2018-06-28 DIAGNOSIS — Z90722 Acquired absence of ovaries, bilateral: Secondary | ICD-10-CM

## 2018-06-28 DIAGNOSIS — Z170 Estrogen receptor positive status [ER+]: Secondary | ICD-10-CM | POA: Diagnosis not present

## 2018-06-28 DIAGNOSIS — M25551 Pain in right hip: Secondary | ICD-10-CM | POA: Diagnosis not present

## 2018-06-28 DIAGNOSIS — Z9221 Personal history of antineoplastic chemotherapy: Secondary | ICD-10-CM | POA: Insufficient documentation

## 2018-06-28 DIAGNOSIS — Z90710 Acquired absence of both cervix and uterus: Secondary | ICD-10-CM | POA: Insufficient documentation

## 2018-06-28 DIAGNOSIS — M25552 Pain in left hip: Secondary | ICD-10-CM | POA: Diagnosis not present

## 2018-06-28 NOTE — Telephone Encounter (Signed)
gve pt avs and calendar

## 2018-06-28 NOTE — Progress Notes (Signed)
  [facility] OFFICE PROGRESS NOTE   Diagnosis: Breast cancer  INTERVAL HISTORY:   Madeline Chandler returns for a scheduled visit.  She continues Femara.  No hot flashes.  She has arthralgias at multiple sites including the hips.  The hip discomfort has been present for several years.  She is taking calcium and vitamin D.  She is scheduled for mammogram in January.  She had a recent colonoscopy with Dr. Collene Mares.  Objective:  Vital signs in last 24 hours:  There were no vitals taken for this visit.    HEENT: Neck without mass Lymphatics: No cervical, supraclavicular, or axillary nodes Resp: Lungs clear bilaterally Cardio: Regular rate and rhythm GI: No hepatomegaly Vascular: No leg edema Breasts: Status post right mastectomy with a TRAM reconstruction.  No evidence for chest wall tumor recurrence.  Left breast without mass.   Medications: I have reviewed the patient's current medications.   Assessment/Plan: 1. Stage II (T2 N0) sees right-sided breast cancer diagnosed in September 1997, status post a lumpectomy, sentinel lymph node biopsy, and right axillary dissectionand radiation followed by adjuvant chemotherapy. She completed 5 years of tamoxifen.  2. 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 Hysterectomy and bilateral oophorectomy April 2008  4. Right breast implant and left breast reduction 03/07/2017     Disposition: Madeline Chandler is in remission from breast cancer.  She will continue Femara.  She will schedule a mammogram and bone  density scan for January 2020.  She is scheduling a right areole the tattoo with plastic surgery.  Madeline Chandler will return for an office visit in 1 year.  It is unclear whether the arthralgias are related to Femara.  I have a low clinical suspicion for metastatic breast cancer.  15 minutes were spent with the patient today.  The majority of the time was used for counseling and coordination of care.  Betsy Coder, MD  06/28/2018  4:44 PM

## 2018-07-10 ENCOUNTER — Other Ambulatory Visit: Payer: Self-pay | Admitting: Family Medicine

## 2018-07-10 DIAGNOSIS — I10 Essential (primary) hypertension: Secondary | ICD-10-CM

## 2018-07-11 ENCOUNTER — Other Ambulatory Visit: Payer: Self-pay | Admitting: Family Medicine

## 2018-07-11 ENCOUNTER — Other Ambulatory Visit: Payer: Self-pay | Admitting: *Deleted

## 2018-07-11 DIAGNOSIS — M25532 Pain in left wrist: Secondary | ICD-10-CM

## 2018-07-13 ENCOUNTER — Other Ambulatory Visit: Payer: Self-pay | Admitting: Family Medicine

## 2018-07-13 DIAGNOSIS — K219 Gastro-esophageal reflux disease without esophagitis: Secondary | ICD-10-CM

## 2018-07-24 ENCOUNTER — Telehealth: Payer: Self-pay | Admitting: Family Medicine

## 2018-07-24 MED ORDER — HYDROCHLOROTHIAZIDE 25 MG PO TABS: 25.0000 mg | ORAL_TABLET | Freq: Every day | ORAL | 0 refills | Status: DC

## 2018-07-24 MED ORDER — LOSARTAN POTASSIUM 100 MG PO TABS: 100.0000 mg | ORAL_TABLET | Freq: Every day | ORAL | 0 refills | Status: DC

## 2018-07-24 NOTE — Telephone Encounter (Signed)
OK to do separate meds, plz call in Losartan 100 mg/d and HCTZ 25 mg/d for a month while pharm gets more. TY.

## 2018-07-24 NOTE — Telephone Encounter (Signed)
[facility] on [address] is out of Losartan-hydrochlorothiazide (Hyzaar) 100-25mg  tablet. Pharmacy will need for another prescription to be sent in to replace Hyzaar.

## 2018-07-24 NOTE — Telephone Encounter (Signed)
Copied from [facility] [id number]. Topic: Quick Communication - Rx Refill/Question >> Jul 24, 2018  3:39 PM Windy Kalata wrote: Medication: losartan-hydrochlorothiazide (HYZAAR) 100-25 MG tablet   Has the patient contacted their pharmacy? Yes.   (Agent: If no, request that the patient contact the pharmacy for the refill.) (Agent: If yes, when and what did the pharmacy advise?) Pharmacy states that they are out of this and it will be awhile before they get it in, please advise if she can get something else.  Preferred Pharmacy (with phone number or street name):   Agent: Please be advised that RX refills may take up to 3 business days. We ask that you follow-up with your pharmacy.

## 2018-07-24 NOTE — Telephone Encounter (Signed)
Prescription change done/sent to pharmacy

## 2018-08-09 ENCOUNTER — Encounter: Payer: Self-pay | Admitting: Family Medicine

## 2018-08-09 ENCOUNTER — Ambulatory Visit (INDEPENDENT_AMBULATORY_CARE_PROVIDER_SITE_OTHER): Payer: 59 | Admitting: Family Medicine

## 2018-08-09 VITALS — BP 132/84 | HR 104 | Temp 98.6°F | Ht 65.5 in | Wt 238.0 lb

## 2018-08-09 DIAGNOSIS — I10 Essential (primary) hypertension: Secondary | ICD-10-CM

## 2018-08-09 DIAGNOSIS — E876 Hypokalemia: Secondary | ICD-10-CM | POA: Diagnosis not present

## 2018-08-09 DIAGNOSIS — E1169 Type 2 diabetes mellitus with other specified complication: Secondary | ICD-10-CM

## 2018-08-09 DIAGNOSIS — M7062 Trochanteric bursitis, left hip: Secondary | ICD-10-CM | POA: Diagnosis not present

## 2018-08-09 DIAGNOSIS — E669 Obesity, unspecified: Secondary | ICD-10-CM

## 2018-08-09 DIAGNOSIS — M654 Radial styloid tenosynovitis [de Quervain]: Secondary | ICD-10-CM

## 2018-08-09 NOTE — Patient Instructions (Addendum)
Give [place] 2-3 business days to get the results of your labs back.   If you do not hear anything about your referral in the next 1-2 weeks, call our office and ask for an update.  Keep the diet clean and stay active.  Ice/cold pack over area for 10-15 min twice daily.  Iliotibial Band Syndrome Rehab It is normal to feel mild stretching, pulling, tightness, or discomfort as you do these exercises, but you should stop right away if you feel sudden pain or your pain gets worse.  Stretching and range of motion exercises These exercises warm up your muscles and joints and improve the movement and flexibility of your hip and pelvis. Exercise A: Quadriceps, prone    1. Lie on your abdomen on a firm surface, such as a bed or padded floor. 2. Bend your left / right knee and hold your ankle. If you cannot reach your ankle or pant leg, loop a belt around your foot and grab the belt instead. 3. Gently pull your heel toward your buttocks. Your knee should not slide out to the side. You should feel a stretch in the front of your thigh and knee. 4. Hold this position for 30 seconds. Repeat 2 times. Complete this stretch 3 times per week. Exercise B: Iliotibial band    1. Lie on your side with your left / right leg in the top position. 2. Bend both of your knees and grab your left / right ankle. Stretch out your bottom arm to help you balance. 3. Slowly bring your top knee back so your thigh goes behind your trunk. 4. Slowly lower your top leg toward the floor until you feel a gentle stretch on the outside of your left / right hip and thigh. If you do not feel a stretch and your knee will not fall farther, place the heel of your other foot on top of your knee and pull your knee down toward the floor with your foot. 5. Hold this position for 30 seconds. Repeat 2 times. Complete this stretch 3 times per week. Strengthening exercises These exercises build strength and endurance in your hip and pelvis.  Endurance is the ability to use your muscles for a long time, even after they get tired. Exercise C: Straight leg raises (hip abductors)     1. Lie on your side with your left / right leg in the top position. Lie so your head, shoulder, knee, and hip line up. You may bend your bottom knee to help you balance. 2. Roll your hips slightly forward so your hips are stacked directly over each other and your left / right knee is facing forward. 3. Tense the muscles in your outer thigh and lift your top leg 4-6 inches (10-15 cm). 4. Hold this position for 3 seconds. Repeat for a total of 10 reps. 5. Slowly return to the starting position. Let your muscles relax completely before doing another repetition. Repeat 2 times. Complete this exercise 3 times per week. Exercise D: Straight leg raises (hip extensors) 1. Lie on your abdomen on your bed or a firm surface. You can put a pillow under your hips if that is more comfortable. 2. Bend your left / right knee so your foot is straight up in the air. 3. Squeeze your buttock muscles and lift your left / right thigh off the bed. Do not let your back arch. 4. Tense this muscle as hard as you can without increasing any knee pain. 5. Hold  this position for 2 seconds. Repeat for a total of 10 reps 6. Slowly lower your leg to the starting position and allow it to relax completely. Repeat 2 times. Complete this exercise 3 times per week. Exercise E: Hip hike 1. Stand sideways on a bottom step. Stand on your left / right leg with your other foot unsupported next to the step. You can hold onto the railing or wall if needed for balance. 2. Keep your knees straight and your torso square. Then, lift your left / right hip up toward the ceiling. 3. Slowly let your left / right hip lower toward the floor, past the starting position. Your foot should get closer to the floor. Do not lean or bend your knees. Repeat 2 times. Complete this exercise 3 times per week.  Document  Released: 07/25/2005 Document Revised: 03/29/2016 Document Reviewed: 06/26/2015 Elsevier Interactive Patient Education  2018 [employer].   Quadriceps Strain Rehab It is normal to feel mild stretching, pulling, tightness, or discomfort as you do these exercises, but you should stop right away if you feel sudden pain or your pain gets worse. Stretching and range of motion exercises These exercises warm up your muscles and joints and improve the movement and flexibility of your thigh. These exercises can also help to relieve stiffness or swelling. Exercise A: Heel slides   1. Lie on your back with both knees straight. If this causes back discomfort, bend the knee of your healthy leg, placing your foot flat on the floor. 2. Slowly slide your left / right heel back toward your buttocks until you feel a gentle stretch in the front of your knee or thigh. 3. Hold for 30 seconds. Then slowly slide your heel back to the starting position. Repeat 2 times. Complete this exercise 3 times a week. Exercise B: Quadriceps stretch, prone   1. Lie on your abdomen on a firm surface, such as a bed or padded floor. 2. Bend your left / right knee and hold your ankle. If you cannot reach your ankle or pant leg, loop a belt around your foot and grab the belt instead. 3. Gently pull your heel toward your buttocks. Your knee should not slide out to the side. You should feel a stretch in the front of your thigh and knee. 4. Hold this position for 30 seconds. Repeat 2 times. Complete this exercise 3 times a week. Strengthening exercises These exercises build strength and endurance in your thigh. Endurance is the ability to use your muscles for a long time, even after your muscles get tired. Exercise C: Straight leg raises (quadriceps and hip flexors) Quality counts! Watch for signs that the quadriceps muscle is working to ensure that you are strengthening the correct muscles and not cheating by using healthier  muscles. 1. Lie on your back with your left / right leg extended and your other knee bent. 2. Tense the muscles in the front of your left / right thigh. You should see your kneecap slide up or see increased dimpling just above the knee. 3. Tighten these muscles even more and raise your leg 4-6 inches (10-15 cm) off the floor. 4. Hold for 3 seconds. 5. Keep the thigh muscles tense as you lower your leg. 6. Relax the muscles slowly and completely after each repetition. Repeat 2 times. Complete this exercise 3 times a week. Exercise D: Straight leg raises (hip extensors) 1. Lie on your belly on a bed or a firm surface with a pillow under your  hips. 2. Bend your left / right knee so your foot is straight up in the air. 3. Tense your buttock muscles and lift your left / right thigh off the bed. Do not let your back arch. 4. Hold this position for 3 seconds. 5. Slowly return to the starting position. Let your muscles relax completely before doing another repetition. Repeat 2 times. Complete this exercise 3 times a week. Exercise E: Wall sits   Follow the directions for form closely. If you do not place your feet and knees properly, this can lead to knee pain. 1. Lean back against a smooth wall or door and walk your feet out 18-24 inches (46-61 cm) from it. Place your feet hip-width apart. 2. Slowly slide down the wall or door until your knees bend  60-90 degrees. Keep your weight back and over your heels, not over your toes. Keep your thighs straight or pointing slightly outward. 3. Hold for 1 second. 4. Use your thigh and buttock muscles to push you back up to a standing position. Keep your weight through your heels while you do this. 5. Rest for 5 seconds in between repetitions. Repeat 2 times. Complete this exercise 3 times a week. Make sure you discuss any questions you have with your health care provider. Document Released: 07/25/2005 Document Revised: 03/31/2016 Document Reviewed:  04/28/2015 Elsevier Interactive Patient Education  [employer].

## 2018-08-09 NOTE — Progress Notes (Signed)
Pre visit review using our clinic review tool, if applicable. No additional management support is needed unless otherwise documented below in the visit note. 

## 2018-08-09 NOTE — Progress Notes (Signed)
Subjective:   Chief Complaint  Patient presents with  . Follow-up    diabetes    Madeline Chandler is a 58 y.o. female here for follow-up of diabetes.   Madeline Chandler's self monitored glucose range is low hundred's in AM. Has been checking sparingly.  Patient does not require insulin.   Medications include: diet controlled Exercise: walking   Hypertension Patient presents for hypertension follow up. She does not monitor home blood pressures. She is compliant with medications- Norvasc 5 mg/d, Hyzaar 100-25 mg/d, Coreg 25 mg/d. Patient has these side effects of medication: none She is adhering to a healthy diet overall. Exercise: some waking  Hx of L hip pain. Worse when she lays on it. Sometimes bothers her gait. No inj or change in activity.  Hand pain continues. Wearing thumb spica. Injections helped for a bit, would like to see specialist at this point.  Past Medical History:  Diagnosis Date  . Anxiety   . Arthritis    "hips" (10/28/2016)  . Breast cancer, right breast ([place]) 1997; 2018   S/P chemo & radiation;   . Bulging lumbar disc    2  . Depression   . GERD (gastroesophageal reflux disease)   . History of colonic polyps    Dr Collene Mares  . Hypertension   . Migraine    "none in the last few years; had them mostly in late 20's/mid 30's" (10/28/2016)  . OSA (obstructive sleep apnea) 02/19/2018  . Pre-diabetes   . Restless leg syndrome      Related testing: Date of retinal exam: Done Pneumovax: done Flu Shot: done  Review of Systems: Pulmonary:  No SOB Cardiovascular:  No chest pain  Objective:  BP 132/84 (BP Location: Left Arm, Patient Position: Sitting, Cuff Size: Large)   Pulse (!) 104   Temp 98.6 F (37 C) (Oral)   Ht 5' 5.5" (1.664 m)   Wt 238 lb (108 kg)   SpO2 95%   BMI 39.00 kg/m  General:  Well developed, well nourished, in no apparent distress Skin:  Warm, no pallor or diaphoresis Head:  Normocephalic, atraumatic Eyes:  Pupils equal and round, sclera  anicteric without injection  Lungs:  CTAB, no access msc use Cardio:  RRR, no bruits, no LE edema Musculoskeletal:  +TTP over L greater troch bursa Psych: Age appropriate judgment and insight  Assessment:   Diabetes mellitus type 2 in obese ([facility]) - Plan: Hemoglobin A1c  Hypokalemia - Plan: Basic metabolic panel  Essential hypertension  Greater trochanteric bursitis of left hip  De Quervain's tenosynovitis, bilateral - Plan: Ambulatory referral to Hand Surgery   Plan:   Orders as above. Counseled on diet and exercise. May need to add Metformin. Cont BP meds. Offered inj, she declined. IT band stretches/exercises. Ice. Quad stretches also provided. Refer to hand per her request. F/u in 3 mo. The patient voiced understanding and agreement to the plan.  [place], DO 08/09/18 4:35 PM

## 2018-08-10 ENCOUNTER — Other Ambulatory Visit: Payer: Self-pay | Admitting: Family Medicine

## 2018-08-10 LAB — BASIC METABOLIC PANEL
BUN: 18 mg/dL (ref 6–23)
CO2: 29 mEq/L (ref 19–32)
Calcium: 10.7 mg/dL — ABNORMAL HIGH (ref 8.4–10.5)
Chloride: 100 mEq/L (ref 96–112)
Creatinine, Ser: 0.89 mg/dL (ref 0.40–1.20)
GFR: 83.64 mL/min (ref >60.00)
Glucose, Bld: 109 mg/dL — ABNORMAL HIGH (ref 70–99)
Potassium: 3.8 mEq/L (ref 3.5–5.1)
Sodium: 138 mEq/L (ref 135–145)

## 2018-08-10 LAB — HEMOGLOBIN A1C: Hgb A1c MFr Bld: 6.8 % — ABNORMAL HIGH (ref 4.6–6.5)

## 2018-08-15 ENCOUNTER — Other Ambulatory Visit: Payer: Self-pay | Admitting: Family Medicine

## 2018-08-15 ENCOUNTER — Encounter: Payer: Self-pay | Admitting: Family Medicine

## 2018-08-15 DIAGNOSIS — M25532 Pain in left wrist: Secondary | ICD-10-CM

## 2018-08-19 ENCOUNTER — Other Ambulatory Visit: Payer: Self-pay | Admitting: Family Medicine

## 2018-09-11 ENCOUNTER — Other Ambulatory Visit: Payer: Self-pay | Admitting: Family Medicine

## 2018-09-11 DIAGNOSIS — M25532 Pain in left wrist: Secondary | ICD-10-CM

## 2018-09-14 ENCOUNTER — Other Ambulatory Visit: Payer: Self-pay | Admitting: Family Medicine

## 2018-09-14 DIAGNOSIS — F411 Generalized anxiety disorder: Secondary | ICD-10-CM

## 2018-09-15 ENCOUNTER — Other Ambulatory Visit: Payer: Self-pay | Admitting: Family Medicine

## 2018-09-15 DIAGNOSIS — I10 Essential (primary) hypertension: Secondary | ICD-10-CM

## 2018-09-24 ENCOUNTER — Encounter: Payer: Self-pay | Admitting: Family Medicine

## 2018-09-24 ENCOUNTER — Ambulatory Visit (INDEPENDENT_AMBULATORY_CARE_PROVIDER_SITE_OTHER): Payer: 59 | Admitting: Family Medicine

## 2018-09-24 MED ORDER — SEMAGLUTIDE (1 MG/DOSE) 2 MG/1.5ML ~~LOC~~ SOPN: 1.0000 mg | PEN_INJECTOR | SUBCUTANEOUS | 5 refills | Status: DC

## 2018-09-24 NOTE — Patient Instructions (Addendum)
Keep the diet clean and stay active.  Ozempic- 0.5 mg weekly for the first 4 weeks then 1 mg weekly after that.   Let us know if you need anything.

## 2018-09-24 NOTE — Progress Notes (Signed)
Chief Complaint  Patient presents with  . Follow-up    discuss starting a new medication    Subjective: Patient is a 58 y.o. female here for obesity medication discussion.  Was rec'd Belviq by GYN. She did not know it was pulled from market. Has tried phentermine in past, made her heart race. Diet is fair, trying to stay active. Walking quite a bit. Having difficultly losing weight.   ROS: Endo: Denies wt loss  Past Medical History:  Diagnosis Date  . Anxiety   . Arthritis    "hips" (10/28/2016)  . Breast cancer, right breast ([place]) 1997; 2018   S/P chemo & radiation;   . Bulging lumbar disc    2  . Depression   . GERD (gastroesophageal reflux disease)   . History of colonic polyps    Dr Collene Mares  . Hypertension   . Migraine    "none in the last few years; had them mostly in late 20's/mid 30's" (10/28/2016)  . OSA (obstructive sleep apnea) 02/19/2018  . Pre-diabetes   . Restless leg syndrome     Objective: BP 124/78 (BP Location: Left Arm, Patient Position: Sitting, Cuff Size: Large)   Pulse 94   Temp 98.7 F (37.1 C) (Oral)   Ht 5' 5.5" (1.664 m)   Wt 235 lb 8 oz (106.8 kg)   SpO2 94%   BMI 38.59 kg/m  General: Awake, appears stated age Lungs: No accessory muscle use Psych: Age appropriate judgment and insight, normal affect and mood  Assessment and Plan: Morbid obesity ([place]) - Plan: Semaglutide, 1 MG/DOSE, (OZEMPIC, 1 MG/DOSE,) 2 MG/1.5ML SOPN  Orders as above. Ozempic. 0.5 mg/week for 4 weeks then 1 mg/week afterwards. Sample/payment card given today. F/u in 2 mo in April.  The patient voiced understanding and agreement to the plan.  [place], DO 09/24/18  4:36 PM

## 2018-09-27 ENCOUNTER — Ambulatory Visit: Payer: 59 | Admitting: Oncology

## 2018-10-07 ENCOUNTER — Other Ambulatory Visit: Payer: Self-pay | Admitting: Family Medicine

## 2018-10-07 DIAGNOSIS — M25532 Pain in left wrist: Secondary | ICD-10-CM

## 2018-11-04 ENCOUNTER — Other Ambulatory Visit: Payer: Self-pay | Admitting: Family Medicine

## 2018-11-04 DIAGNOSIS — M25532 Pain in left wrist: Secondary | ICD-10-CM

## 2018-11-05 ENCOUNTER — Encounter: Payer: Self-pay | Admitting: Family Medicine

## 2018-11-08 ENCOUNTER — Encounter: Payer: Self-pay | Admitting: Family Medicine

## 2018-11-08 ENCOUNTER — Other Ambulatory Visit: Payer: Self-pay | Admitting: Family Medicine

## 2018-11-08 ENCOUNTER — Ambulatory Visit: Payer: 59 | Admitting: Family Medicine

## 2018-11-08 ENCOUNTER — Ambulatory Visit (INDEPENDENT_AMBULATORY_CARE_PROVIDER_SITE_OTHER): Payer: 59 | Admitting: Family Medicine

## 2018-11-08 ENCOUNTER — Other Ambulatory Visit: Payer: Self-pay

## 2018-11-08 DIAGNOSIS — K219 Gastro-esophageal reflux disease without esophagitis: Secondary | ICD-10-CM

## 2018-11-08 DIAGNOSIS — E1169 Type 2 diabetes mellitus with other specified complication: Secondary | ICD-10-CM

## 2018-11-08 DIAGNOSIS — E669 Obesity, unspecified: Secondary | ICD-10-CM

## 2018-11-08 DIAGNOSIS — I10 Essential (primary) hypertension: Secondary | ICD-10-CM | POA: Diagnosis not present

## 2018-11-08 NOTE — Progress Notes (Signed)
Virtual Visit via Telephone Note  I connected with [name] on 11/08/18 at  2:00 PM EDT by telephone and verified that I am speaking with the correct person using two identifiers.   I discussed the limitations, risks, security and privacy concerns of performing an evaluation and management service by telephone and the availability of in person appointments. I also discussed with the patient that there may be a patient responsible charge related to this service. The patient expressed understanding and agreed to proceed.   History of Present Illness: Diabetic, taking Ozempic weekly and doing well. Has lost 10 lbs.    Observations/Objective: No conversational dyspnea Age appropriate judgment and insight Nml affect and mood  Assessment and Plan: Diabetes mellitus type 2 in obese ([place]) - Plan: Hemoglobin A1c  Essential hypertension  OK to stop checking sugars. Cont Ozempic. Ck BP at home, goals set. If not at goal, will schedule appt. Counseled on diet and exercise.   Follow Up Instructions: 6 months   I discussed the assessment and treatment plan with the patient. The patient was provided an opportunity to ask questions and all were answered. The patient agreed with the plan and demonstrated an understanding of the instructions.   The patient was advised to call back or seek an in-person evaluation if the symptoms worsen or if the condition fails to improve as anticipated.  I provided 23 minutes of non-face-to-face time during this encounter.   [place], DO

## 2018-12-16 ENCOUNTER — Other Ambulatory Visit: Payer: Self-pay | Admitting: Family Medicine

## 2018-12-18 ENCOUNTER — Other Ambulatory Visit: Payer: Self-pay | Admitting: Family Medicine

## 2018-12-23 ENCOUNTER — Other Ambulatory Visit: Payer: Self-pay | Admitting: Family Medicine

## 2018-12-23 DIAGNOSIS — I10 Essential (primary) hypertension: Secondary | ICD-10-CM

## 2019-01-02 ENCOUNTER — Other Ambulatory Visit: Payer: Self-pay | Admitting: Family Medicine

## 2019-01-02 DIAGNOSIS — F411 Generalized anxiety disorder: Secondary | ICD-10-CM

## 2019-01-02 DIAGNOSIS — K219 Gastro-esophageal reflux disease without esophagitis: Secondary | ICD-10-CM

## 2019-01-02 MED ORDER — DULOXETINE HCL 30 MG PO CPEP: ORAL_CAPSULE | ORAL | 3 refills | Status: DC

## 2019-01-03 ENCOUNTER — Other Ambulatory Visit: Payer: Self-pay | Admitting: Family Medicine

## 2019-01-03 DIAGNOSIS — F411 Generalized anxiety disorder: Secondary | ICD-10-CM

## 2019-01-03 MED ORDER — DULOXETINE HCL 30 MG PO CPEP: ORAL_CAPSULE | ORAL | 0 refills | Status: DC

## 2019-01-11 ENCOUNTER — Other Ambulatory Visit: Payer: Self-pay | Admitting: Family Medicine

## 2019-02-04 ENCOUNTER — Other Ambulatory Visit: Payer: Self-pay | Admitting: *Deleted

## 2019-02-04 ENCOUNTER — Other Ambulatory Visit: Payer: Self-pay | Admitting: Family Medicine

## 2019-02-04 DIAGNOSIS — Z170 Estrogen receptor positive status [ER+]: Secondary | ICD-10-CM

## 2019-02-04 DIAGNOSIS — C50211 Malignant neoplasm of upper-inner quadrant of right female breast: Secondary | ICD-10-CM

## 2019-02-04 MED ORDER — LETROZOLE 2.5 MG PO TABS: 2.5000 mg | ORAL_TABLET | Freq: Every day | ORAL | 3 refills | Status: DC

## 2019-02-27 ENCOUNTER — Other Ambulatory Visit: Payer: Self-pay | Admitting: Family Medicine

## 2019-03-09 ENCOUNTER — Inpatient Hospital Stay (HOSPITAL_COMMUNITY): Payer: 59 | Admitting: Anesthesiology

## 2019-03-09 ENCOUNTER — Inpatient Hospital Stay (HOSPITAL_COMMUNITY)
Admission: EM | Admit: 2019-03-09 | Discharge: 2019-03-12 | DRG: 493 | Disposition: A | Discharge status: Home Health | Payer: 59 | Attending: Internal Medicine | Admitting: Internal Medicine

## 2019-03-09 ENCOUNTER — Encounter (HOSPITAL_COMMUNITY): Payer: Self-pay

## 2019-03-09 ENCOUNTER — Encounter (HOSPITAL_COMMUNITY)
Admission: EM | Disposition: A | Discharge status: Home Health | Payer: Self-pay | Source: Home / Self Care | Attending: Internal Medicine

## 2019-03-09 ENCOUNTER — Inpatient Hospital Stay (HOSPITAL_COMMUNITY): Payer: 59

## 2019-03-09 ENCOUNTER — Emergency Department (HOSPITAL_COMMUNITY): Payer: 59

## 2019-03-09 ENCOUNTER — Other Ambulatory Visit: Payer: Self-pay

## 2019-03-09 DIAGNOSIS — K219 Gastro-esophageal reflux disease without esophagitis: Secondary | ICD-10-CM | POA: Diagnosis present

## 2019-03-09 DIAGNOSIS — R197 Diarrhea, unspecified: Secondary | ICD-10-CM | POA: Diagnosis present

## 2019-03-09 DIAGNOSIS — Z20828 Contact with and (suspected) exposure to other viral communicable diseases: Secondary | ICD-10-CM | POA: Diagnosis present

## 2019-03-09 DIAGNOSIS — Z853 Personal history of malignant neoplasm of breast: Secondary | ICD-10-CM

## 2019-03-09 DIAGNOSIS — Z833 Family history of diabetes mellitus: Secondary | ICD-10-CM

## 2019-03-09 DIAGNOSIS — E119 Type 2 diabetes mellitus without complications: Secondary | ICD-10-CM | POA: Diagnosis present

## 2019-03-09 DIAGNOSIS — G4733 Obstructive sleep apnea (adult) (pediatric): Secondary | ICD-10-CM | POA: Diagnosis present

## 2019-03-09 DIAGNOSIS — Z96643 Presence of artificial hip joint, bilateral: Secondary | ICD-10-CM | POA: Diagnosis present

## 2019-03-09 DIAGNOSIS — E876 Hypokalemia: Secondary | ICD-10-CM

## 2019-03-09 DIAGNOSIS — Z9011 Acquired absence of right breast and nipple: Secondary | ICD-10-CM

## 2019-03-09 DIAGNOSIS — G2581 Restless legs syndrome: Secondary | ICD-10-CM | POA: Diagnosis present

## 2019-03-09 DIAGNOSIS — Z90710 Acquired absence of both cervix and uterus: Secondary | ICD-10-CM

## 2019-03-09 DIAGNOSIS — Z9882 Breast implant status: Secondary | ICD-10-CM

## 2019-03-09 DIAGNOSIS — Z86010 Personal history of colonic polyps: Secondary | ICD-10-CM

## 2019-03-09 DIAGNOSIS — R339 Retention of urine, unspecified: Secondary | ICD-10-CM | POA: Diagnosis present

## 2019-03-09 DIAGNOSIS — R55 Syncope and collapse: Secondary | ICD-10-CM | POA: Diagnosis present

## 2019-03-09 DIAGNOSIS — I10 Essential (primary) hypertension: Secondary | ICD-10-CM | POA: Diagnosis present

## 2019-03-09 DIAGNOSIS — Z791 Long term (current) use of non-steroidal anti-inflammatories (NSAID): Secondary | ICD-10-CM

## 2019-03-09 DIAGNOSIS — Z9221 Personal history of antineoplastic chemotherapy: Secondary | ICD-10-CM

## 2019-03-09 DIAGNOSIS — N179 Acute kidney failure, unspecified: Secondary | ICD-10-CM | POA: Diagnosis present

## 2019-03-09 DIAGNOSIS — Z981 Arthrodesis status: Secondary | ICD-10-CM

## 2019-03-09 DIAGNOSIS — E669 Obesity, unspecified: Secondary | ICD-10-CM | POA: Diagnosis present

## 2019-03-09 DIAGNOSIS — S82452A Displaced comminuted fracture of shaft of left fibula, initial encounter for closed fracture: Secondary | ICD-10-CM | POA: Diagnosis present

## 2019-03-09 DIAGNOSIS — Y92002 Bathroom of unspecified non-institutional (private) residence single-family (private) house as the place of occurrence of the external cause: Secondary | ICD-10-CM

## 2019-03-09 DIAGNOSIS — Z79811 Long term (current) use of aromatase inhibitors: Secondary | ICD-10-CM

## 2019-03-09 DIAGNOSIS — Z803 Family history of malignant neoplasm of breast: Secondary | ICD-10-CM

## 2019-03-09 DIAGNOSIS — Z6835 Body mass index (BMI) 35.0-35.9, adult: Secondary | ICD-10-CM

## 2019-03-09 DIAGNOSIS — W1812XA Fall from or off toilet with subsequent striking against object, initial encounter: Secondary | ICD-10-CM | POA: Diagnosis present

## 2019-03-09 DIAGNOSIS — S82899A Other fracture of unspecified lower leg, initial encounter for closed fracture: Secondary | ICD-10-CM

## 2019-03-09 DIAGNOSIS — F419 Anxiety disorder, unspecified: Secondary | ICD-10-CM | POA: Diagnosis present

## 2019-03-09 DIAGNOSIS — Z8249 Family history of ischemic heart disease and other diseases of the circulatory system: Secondary | ICD-10-CM

## 2019-03-09 DIAGNOSIS — Z79899 Other long term (current) drug therapy: Secondary | ICD-10-CM

## 2019-03-09 DIAGNOSIS — F329 Major depressive disorder, single episode, unspecified: Secondary | ICD-10-CM | POA: Diagnosis present

## 2019-03-09 DIAGNOSIS — Z888 Allergy status to other drugs, medicaments and biological substances status: Secondary | ICD-10-CM | POA: Diagnosis not present

## 2019-03-09 DIAGNOSIS — Z923 Personal history of irradiation: Secondary | ICD-10-CM | POA: Diagnosis not present

## 2019-03-09 DIAGNOSIS — C50911 Malignant neoplasm of unspecified site of right female breast: Secondary | ICD-10-CM

## 2019-03-09 DIAGNOSIS — T148XXA Other injury of unspecified body region, initial encounter: Secondary | ICD-10-CM

## 2019-03-09 DIAGNOSIS — S82892D Other fracture of left lower leg, subsequent encounter for closed fracture with routine healing: Secondary | ICD-10-CM | POA: Diagnosis not present

## 2019-03-09 DIAGNOSIS — E1169 Type 2 diabetes mellitus with other specified complication: Secondary | ICD-10-CM | POA: Diagnosis present

## 2019-03-09 DIAGNOSIS — Z806 Family history of leukemia: Secondary | ICD-10-CM

## 2019-03-09 DIAGNOSIS — S82252A Displaced comminuted fracture of shaft of left tibia, initial encounter for closed fracture: Secondary | ICD-10-CM

## 2019-03-09 HISTORY — DX: Other fracture of unspecified lower leg, initial encounter for closed fracture: S82.899A

## 2019-03-09 HISTORY — PX: TIBIA IM NAIL INSERTION: SHX2516

## 2019-03-09 LAB — BASIC METABOLIC PANEL
Anion gap: 10 (ref 5–15)
Anion gap: 13 (ref 5–15)
BUN: 19 mg/dL (ref 6–20)
BUN: 16 mg/dL (ref 6–20)
CO2: 32 mmol/L (ref 22–32)
CO2: 24 mmol/L (ref 22–32)
Calcium: 10.2 mg/dL (ref 8.9–10.3)
Calcium: 9.5 mg/dL (ref 8.9–10.3)
Chloride: 96 mmol/L — ABNORMAL LOW (ref 98–111)
Chloride: 101 mmol/L (ref 98–111)
Creatinine, Ser: 1.2 mg/dL — ABNORMAL HIGH (ref 0.44–1.00)
Creatinine, Ser: 1.04 mg/dL — ABNORMAL HIGH (ref 0.44–1.00)
GFR calc Af Amer: 57 mL/min — ABNORMAL LOW (ref >60)
GFR calc Af Amer: >60 mL/min (ref >60)
GFR calc non Af Amer: 49 mL/min — ABNORMAL LOW (ref >60)
GFR calc non Af Amer: 59 mL/min — ABNORMAL LOW (ref >60)
Glucose, Bld: 106 mg/dL — ABNORMAL HIGH (ref 70–99)
Glucose, Bld: 107 mg/dL — ABNORMAL HIGH (ref 70–99)
Potassium: 2.8 mmol/L — ABNORMAL LOW (ref 3.5–5.1)
Potassium: 3.7 mmol/L (ref 3.5–5.1)
Sodium: 138 mmol/L (ref 135–145)
Sodium: 138 mmol/L (ref 135–145)

## 2019-03-09 LAB — CBC WITH DIFFERENTIAL/PLATELET
Abs Immature Granulocytes: 0.03 10*3/uL (ref 0.00–0.07)
Basophils Absolute: 0 10*3/uL (ref 0.0–0.1)
Basophils Relative: 0 %
Eosinophils Absolute: 0.1 10*3/uL (ref 0.0–0.5)
Eosinophils Relative: 1 %
HCT: 38.1 % (ref 36.0–46.0)
Hemoglobin: 12.1 g/dL (ref 12.0–15.0)
Immature Granulocytes: 0 %
Lymphocytes Relative: 23 %
Lymphs Abs: 2.2 10*3/uL (ref 0.7–4.0)
MCH: 28.7 pg (ref 26.0–34.0)
MCHC: 31.8 g/dL (ref 30.0–36.0)
MCV: 90.3 fL (ref 80.0–100.0)
Monocytes Absolute: 0.8 10*3/uL (ref 0.1–1.0)
Monocytes Relative: 8 %
Neutro Abs: 6.4 10*3/uL (ref 1.7–7.7)
Neutrophils Relative %: 68 %
Platelets: 226 10*3/uL (ref 150–400)
RBC: 4.22 MIL/uL (ref 3.87–5.11)
RDW: 13.5 % (ref 11.5–15.5)
WBC: 9.6 10*3/uL (ref 4.0–10.5)
nRBC: 0 % (ref 0.0–0.2)

## 2019-03-09 LAB — TROPONIN I (HIGH SENSITIVITY): Troponin I (High Sensitivity): 5 ng/L (ref <18)

## 2019-03-09 LAB — SARS CORONAVIRUS 2 BY RT PCR (HOSPITAL ORDER, PERFORMED IN ~~LOC~~ HOSPITAL LAB): SARS Coronavirus 2: NEGATIVE

## 2019-03-09 LAB — MAGNESIUM: Magnesium: 2.2 mg/dL (ref 1.7–2.4)

## 2019-03-09 LAB — CBG MONITORING, ED: Glucose-Capillary: 87 mg/dL (ref 70–99)

## 2019-03-09 SURGERY — INSERTION, INTRAMEDULLARY ROD, TIBIA
Anesthesia: General | Site: Leg Lower | Laterality: Left

## 2019-03-09 MED ORDER — PROPOFOL 500 MG/50ML IV EMUL
INTRAVENOUS | Status: DC | PRN
  Administered 2019-03-09: 50 ug/kg/min via INTRAVENOUS

## 2019-03-09 MED ORDER — ONDANSETRON HCL 4 MG/2ML IJ SOLN: 4.0000 mg | Freq: Four times a day (QID) | INTRAMUSCULAR | Status: DC | PRN

## 2019-03-09 MED ORDER — CEFAZOLIN SODIUM 1 G IJ SOLR: 2.0000 g | Freq: Three times a day (TID) | INTRAMUSCULAR | Status: DC

## 2019-03-09 MED ORDER — DEXAMETHASONE SODIUM PHOSPHATE 10 MG/ML IJ SOLN
INTRAMUSCULAR | Status: AC
  Filled 2019-03-09: qty 1

## 2019-03-09 MED ORDER — POTASSIUM CHLORIDE 10 MEQ/100ML IV SOLN: 10.0000 meq | INTRAVENOUS | Status: DC

## 2019-03-09 MED ORDER — PANTOPRAZOLE SODIUM 40 MG PO TBEC
40.0000 mg | DELAYED_RELEASE_TABLET | Freq: Every day | ORAL | Status: DC
  Administered 2019-03-10 – 2019-03-12 (×3): 40 mg via ORAL
  Filled 2019-03-09 (×3): qty 1

## 2019-03-09 MED ORDER — ACETAMINOPHEN 650 MG RE SUPP: 650.0000 mg | Freq: Four times a day (QID) | RECTAL | Status: DC | PRN

## 2019-03-09 MED ORDER — FENTANYL CITRATE (PF) 100 MCG/2ML IJ SOLN
INTRAMUSCULAR | Status: DC | PRN
  Administered 2019-03-09: 50 ug via INTRAVENOUS
  Administered 2019-03-09 (×2): 25 ug via INTRAVENOUS

## 2019-03-09 MED ORDER — ACETAMINOPHEN 325 MG PO TABS: 650.0000 mg | ORAL_TABLET | Freq: Four times a day (QID) | ORAL | Status: DC | PRN

## 2019-03-09 MED ORDER — DEXTROSE-NACL 5-0.9 % IV SOLN
INTRAVENOUS | Status: DC
  Administered 2019-03-09 – 2019-03-11 (×3): via INTRAVENOUS

## 2019-03-09 MED ORDER — ONDANSETRON HCL 4 MG/2ML IJ SOLN
4.0000 mg | Freq: Once | INTRAMUSCULAR | Status: AC
  Administered 2019-03-09: 4 mg via INTRAVENOUS
  Filled 2019-03-09: qty 2

## 2019-03-09 MED ORDER — 0.9 % SODIUM CHLORIDE (POUR BTL) OPTIME
TOPICAL | Status: DC | PRN
  Administered 2019-03-09: 1000 mL

## 2019-03-09 MED ORDER — HYDROCODONE-ACETAMINOPHEN 5-325 MG PO TABS
1.0000 | ORAL_TABLET | ORAL | Status: DC | PRN
  Administered 2019-03-09: 17:00:00 2 via ORAL
  Administered 2019-03-10: 04:00:00 1 via ORAL
  Administered 2019-03-10 – 2019-03-12 (×7): 2 via ORAL
  Filled 2019-03-09 (×7): qty 2
  Filled 2019-03-09: qty 1

## 2019-03-09 MED ORDER — DULOXETINE HCL 30 MG PO CPEP
30.0000 mg | ORAL_CAPSULE | Freq: Every day | ORAL | Status: DC
  Administered 2019-03-10 – 2019-03-12 (×3): 30 mg via ORAL
  Filled 2019-03-09 (×3): qty 1

## 2019-03-09 MED ORDER — HYDROMORPHONE HCL 2 MG/ML IJ SOLN
0.5000 mg | INTRAMUSCULAR | Status: DC | PRN
  Administered 2019-03-09 – 2019-03-10 (×3): 0.5 mg via INTRAVENOUS
  Filled 2019-03-09 (×3): qty 1

## 2019-03-09 MED ORDER — AMPHETAMINE-DEXTROAMPHETAMINE 10 MG PO TABS
20.0000 mg | ORAL_TABLET | Freq: Every day | ORAL | Status: DC
  Administered 2019-03-10 – 2019-03-12 (×3): 20 mg via ORAL
  Filled 2019-03-09 (×3): qty 2

## 2019-03-09 MED ORDER — HYDROCODONE-ACETAMINOPHEN 5-325 MG PO TABS
ORAL_TABLET | ORAL | Status: AC
  Filled 2019-03-09: qty 2

## 2019-03-09 MED ORDER — POTASSIUM CHLORIDE 10 MEQ/100ML IV SOLN
10.0000 meq | INTRAVENOUS | Status: AC
  Administered 2019-03-09 (×4): 10 meq via INTRAVENOUS
  Filled 2019-03-09 (×4): qty 100

## 2019-03-09 MED ORDER — CARVEDILOL 3.125 MG PO TABS
3.1250 mg | ORAL_TABLET | Freq: Two times a day (BID) | ORAL | Status: DC
  Administered 2019-03-09 – 2019-03-12 (×6): 3.125 mg via ORAL
  Filled 2019-03-09 (×7): qty 1

## 2019-03-09 MED ORDER — MIDAZOLAM HCL 2 MG/2ML IJ SOLN
INTRAMUSCULAR | Status: AC
  Filled 2019-03-09: qty 2

## 2019-03-09 MED ORDER — LACTATED RINGERS IV SOLN
INTRAVENOUS | Status: DC | PRN
  Administered 2019-03-09: 14:00:00 via INTRAVENOUS

## 2019-03-09 MED ORDER — LIDOCAINE 2% (20 MG/ML) 5 ML SYRINGE
INTRAMUSCULAR | Status: AC
  Filled 2019-03-09: qty 5

## 2019-03-09 MED ORDER — FENTANYL CITRATE (PF) 250 MCG/5ML IJ SOLN
INTRAMUSCULAR | Status: AC
  Filled 2019-03-09: qty 5

## 2019-03-09 MED ORDER — ROCURONIUM BROMIDE 10 MG/ML (PF) SYRINGE
PREFILLED_SYRINGE | INTRAVENOUS | Status: AC
  Filled 2019-03-09: qty 10

## 2019-03-09 MED ORDER — KCL IN DEXTROSE-NACL 20-5-0.9 MEQ/L-%-% IV SOLN
INTRAVENOUS | Status: DC
  Filled 2019-03-09: qty 1000

## 2019-03-09 MED ORDER — POTASSIUM CHLORIDE CRYS ER 20 MEQ PO TBCR
40.0000 meq | EXTENDED_RELEASE_TABLET | Freq: Once | ORAL | Status: AC
  Administered 2019-03-09: 40 meq via ORAL
  Filled 2019-03-09: qty 2

## 2019-03-09 MED ORDER — SODIUM CHLORIDE 0.9 % IV BOLUS
1000.0000 mL | Freq: Once | INTRAVENOUS | Status: AC
  Administered 2019-03-09: 1000 mL via INTRAVENOUS

## 2019-03-09 MED ORDER — PROPOFOL 10 MG/ML IV BOLUS
INTRAVENOUS | Status: AC
  Filled 2019-03-09: qty 20

## 2019-03-09 MED ORDER — CEFAZOLIN SODIUM-DEXTROSE 2-4 GM/100ML-% IV SOLN
INTRAVENOUS | Status: AC
  Filled 2019-03-09: qty 100

## 2019-03-09 MED ORDER — PHENYLEPHRINE HCL (PRESSORS) 10 MG/ML IV SOLN
INTRAVENOUS | Status: DC | PRN
  Administered 2019-03-09: 40 ug via INTRAVENOUS

## 2019-03-09 MED ORDER — SENNA 8.6 MG PO TABS
1.0000 | ORAL_TABLET | Freq: Two times a day (BID) | ORAL | Status: DC
  Administered 2019-03-09 – 2019-03-12 (×6): 8.6 mg via ORAL
  Filled 2019-03-09 (×6): qty 1

## 2019-03-09 MED ORDER — HYDROMORPHONE HCL 1 MG/ML IJ SOLN
1.0000 mg | Freq: Once | INTRAMUSCULAR | Status: AC
  Administered 2019-03-09: 1 mg via INTRAVENOUS
  Filled 2019-03-09: qty 1

## 2019-03-09 MED ORDER — MEPERIDINE HCL 25 MG/ML IJ SOLN: 6.2500 mg | INTRAMUSCULAR | Status: DC | PRN

## 2019-03-09 MED ORDER — POTASSIUM CHLORIDE 2 MEQ/ML IV SOLN: INTRAVENOUS | Status: DC

## 2019-03-09 MED ORDER — CEFAZOLIN SODIUM-DEXTROSE 2-4 GM/100ML-% IV SOLN
2.0000 g | INTRAVENOUS | Status: AC
  Administered 2019-03-09: 15:00:00 2 g via INTRAVENOUS

## 2019-03-09 MED ORDER — ONDANSETRON HCL 4 MG PO TABS: 4.0000 mg | ORAL_TABLET | Freq: Four times a day (QID) | ORAL | Status: DC | PRN

## 2019-03-09 MED ORDER — HYDROMORPHONE HCL 1 MG/ML IJ SOLN: 0.2500 mg | INTRAMUSCULAR | Status: DC | PRN

## 2019-03-09 MED ORDER — FENTANYL CITRATE (PF) 100 MCG/2ML IJ SOLN
100.0000 ug | INTRAMUSCULAR | Status: DC | PRN
  Administered 2019-03-09: 100 ug via INTRAVENOUS
  Filled 2019-03-09: qty 2

## 2019-03-09 MED ORDER — POLYETHYLENE GLYCOL 3350 17 G PO PACK: 17.0000 g | PACK | Freq: Every day | ORAL | Status: DC | PRN

## 2019-03-09 MED ORDER — SODIUM CHLORIDE 0.9 % IV SOLN
INTRAVENOUS | Status: DC | PRN
  Administered 2019-03-09: 50 ug/min via INTRAVENOUS

## 2019-03-09 MED ORDER — LETROZOLE 2.5 MG PO TABS
2.5000 mg | ORAL_TABLET | Freq: Every day | ORAL | Status: DC
  Administered 2019-03-10 – 2019-03-12 (×3): 2.5 mg via ORAL
  Filled 2019-03-09 (×4): qty 1

## 2019-03-09 MED ORDER — ONDANSETRON HCL 4 MG/2ML IJ SOLN: 4.0000 mg | Freq: Once | INTRAMUSCULAR | Status: DC | PRN

## 2019-03-09 MED ORDER — ONDANSETRON HCL 4 MG/2ML IJ SOLN
INTRAMUSCULAR | Status: AC
  Filled 2019-03-09: qty 2

## 2019-03-09 MED ORDER — MIDAZOLAM HCL 5 MG/5ML IJ SOLN
INTRAMUSCULAR | Status: DC | PRN
  Administered 2019-03-09: 1 mg via INTRAVENOUS

## 2019-03-09 MED ORDER — ONDANSETRON HCL 4 MG/2ML IJ SOLN
INTRAMUSCULAR | Status: DC | PRN
  Administered 2019-03-09: 4 mg via INTRAVENOUS

## 2019-03-09 MED ORDER — CEFAZOLIN SODIUM-DEXTROSE 2-4 GM/100ML-% IV SOLN
2.0000 g | Freq: Three times a day (TID) | INTRAVENOUS | Status: AC
  Administered 2019-03-09 – 2019-03-10 (×3): 2 g via INTRAVENOUS
  Filled 2019-03-09 (×3): qty 100

## 2019-03-09 SURGICAL SUPPLY — 67 items
BANDAGE ESMARK 6X9 LF (GAUZE/BANDAGES/DRESSINGS) IMPLANT
BIT DRILL CALIBRATED 4.3X320MM (BIT) ×1 IMPLANT
BIT DRILL CROWE POINT TWST 4.3 (DRILL) ×1 IMPLANT
BNDG COHESIVE 4X5 TAN STRL (GAUZE/BANDAGES/DRESSINGS) ×4 IMPLANT
BNDG ELASTIC 4X5.8 VLCR STR LF (GAUZE/BANDAGES/DRESSINGS) IMPLANT
BNDG ELASTIC 6X10 VLCR STRL LF (GAUZE/BANDAGES/DRESSINGS) ×2 IMPLANT
BNDG ELASTIC 6X5.8 VLCR STR LF (GAUZE/BANDAGES/DRESSINGS) IMPLANT
BNDG ESMARK 6X9 LF (GAUZE/BANDAGES/DRESSINGS)
BNDG GAUZE ELAST 4 BULKY (GAUZE/BANDAGES/DRESSINGS) IMPLANT
CONT SPEC 4OZ CLIKSEAL STRL BL (MISCELLANEOUS) ×2 IMPLANT
COVER SURGICAL LIGHT HANDLE (MISCELLANEOUS) ×4 IMPLANT
COVER WAND RF STERILE (DRAPES) IMPLANT
CUFF TOURN SGL QUICK 34 (TOURNIQUET CUFF) ×1
CUFF TOURN SGL QUICK 42 (TOURNIQUET CUFF) IMPLANT
CUFF TRNQT CYL 34X4.125X (TOURNIQUET CUFF) ×1 IMPLANT
DRAPE C-ARM 42X72 X-RAY (DRAPES) ×2 IMPLANT
DRAPE C-ARMOR (DRAPES) ×2 IMPLANT
DRAPE EXTREMITY T 121X128X90 (DISPOSABLE) ×2 IMPLANT
DRAPE IMP U-DRAPE 54X76 (DRAPES) ×2 IMPLANT
DRAPE INCISE IOBAN 66X45 STRL (DRAPES) ×2 IMPLANT
DRAPE ORTHO SPLIT 77X108 STRL (DRAPES) ×2
DRAPE SURG ORHT 6 SPLT 77X108 (DRAPES) ×2 IMPLANT
DRAPE U-SHAPE 47X51 STRL (DRAPES) ×2 IMPLANT
DRILL CALIBRATED 4.3X320MM (BIT) ×2
DRILL CROWE POINT TWIST 4.3 (DRILL) ×2
DURAPREP 26ML APPLICATOR (WOUND CARE) ×2 IMPLANT
ELECT REM PT RETURN 9FT ADLT (ELECTROSURGICAL) ×2
ELECTRODE REM PT RTRN 9FT ADLT (ELECTROSURGICAL) ×1 IMPLANT
EVACUATOR 1/8 PVC DRAIN (DRAIN) IMPLANT
FACESHIELD OPICON LG (MASK) IMPLANT
GAUZE SPONGE 4X4 12PLY STRL (GAUZE/BANDAGES/DRESSINGS) ×2 IMPLANT
GAUZE XEROFORM 1X8 LF (GAUZE/BANDAGES/DRESSINGS) ×2 IMPLANT
GLOVE BIOGEL M 6.5 STRL (GLOVE) ×4 IMPLANT
GLOVE BIOGEL M 7.0 STRL (GLOVE) ×2 IMPLANT
GLOVE BIOGEL M STRL SZ7.5 (GLOVE) ×4 IMPLANT
GLOVE BIOGEL PI IND STRL 8 (GLOVE) IMPLANT
GLOVE BIOGEL PI INDICATOR 8 (GLOVE)
GLOVE ORTHO TXT STRL SZ7.5 (GLOVE) IMPLANT
GLOVE SURG ORTHO 8.0 STRL STRW (GLOVE) IMPLANT
GOWN STRL REUS W/ TWL LRG LVL3 (GOWN DISPOSABLE) ×2 IMPLANT
GOWN STRL REUS W/ TWL XL LVL3 (GOWN DISPOSABLE) ×1 IMPLANT
GOWN STRL REUS W/TWL LRG LVL3 (GOWN DISPOSABLE) ×2
GOWN STRL REUS W/TWL XL LVL3 (GOWN DISPOSABLE) ×1
GUIDEPIN 3.2X17.5 THRD DISP (PIN) ×2 IMPLANT
GUIDEWIRE 2.6X80 BEAD TIP (WIRE) ×2 IMPLANT
GUIDWIRE 2.6X80 BEAD TIP (WIRE) ×4
KIT BASIN OR (CUSTOM PROCEDURE TRAY) ×2 IMPLANT
KIT TURNOVER KIT B (KITS) ×2 IMPLANT
NAIL TIBIAL PHOENIX 9.0X340MM (Nail) ×2 IMPLANT
PACK GENERAL/GYN (CUSTOM PROCEDURE TRAY) ×2 IMPLANT
PACK UNIVERSAL I (CUSTOM PROCEDURE TRAY) ×2 IMPLANT
PAD ARMBOARD 7.5X6 YLW CONV (MISCELLANEOUS) ×4 IMPLANT
PADDING CAST COTTON 6X4 STRL (CAST SUPPLIES) ×2 IMPLANT
PLATE RECON RT 13H (Plate) ×2 IMPLANT
SCREW CORT TI DBL LEAD 5X26 (Screw) ×4 IMPLANT
SCREW CORT TI DBL LEAD 5X32 (Screw) ×2 IMPLANT
SCREW CORT TI DBL LEAD 5X36 (Screw) ×2 IMPLANT
SCREW CORT TI DBL LEAD 5X40 (Screw) ×2 IMPLANT
STAPLER VISISTAT 35W (STAPLE) ×2 IMPLANT
STOCKINETTE IMPERVIOUS 9X36 MD (GAUZE/BANDAGES/DRESSINGS) ×2 IMPLANT
SUT VIC AB 1 CTB1 27 (SUTURE) IMPLANT
SUT VIC AB 2-0 CT1 27 (SUTURE)
SUT VIC AB 2-0 CT1 TAPERPNT 27 (SUTURE) IMPLANT
TOWEL GREEN STERILE (TOWEL DISPOSABLE) ×2 IMPLANT
TOWEL GREEN STERILE FF (TOWEL DISPOSABLE) ×2 IMPLANT
TUBE CONNECTING 12X1/4 (SUCTIONS) ×2 IMPLANT
YANKAUER SUCT BULB TIP NO VENT (SUCTIONS) ×2 IMPLANT

## 2019-03-09 NOTE — ED Triage Notes (Signed)
Per EMS - Pt coming from home pt states was laying in bed stated having abd pain, started having diarrhea, started feeling hot and fell off toilet, hitting left leg on cabinet. Complaints of left leg pain and deformity, swelling, and unstableness with movement. Pt describes feeling as a "wooshing". No LOC, did not hit head, no blood thinners. Restricted right arm from mastectomy. Pedal pulses +. Able to move toes. Difficulty t urning leg inward. Hx DM   100 mcg Fentanyl  20 lt AC   136 62 86 HR 99% RA 16 R CBG 112

## 2019-03-09 NOTE — Anesthesia Postprocedure Evaluation (Signed)
Anesthesia Post Note  Patient: Madeline Chandler  Procedure(s) Performed: INTRAMEDULLARY (IM) NAIL TIBIAL (Left Leg Lower)     Patient location during evaluation: PACU Anesthesia Type: Regional Level of consciousness: awake and alert and patient cooperative Pain management: pain level controlled Vital Signs Assessment: post-procedure vital signs reviewed and stable Respiratory status: spontaneous breathing and respiratory function stable Cardiovascular status: stable Anesthetic complications: no    Last Vitals:  Vitals:   03/09/19 1755 03/09/19 1955  BP: (!) 138/97 131/78  Pulse: (!) 104 88  Resp: 18 14  Temp: (!) 36.3 C 37.4 C  SpO2: 97% 97%    Last Pain:  Vitals:   03/09/19 1955  TempSrc: Oral  PainSc:                  Add Dinapoli DAVID

## 2019-03-09 NOTE — H&P (Signed)
History and Physical  Madeline Chandler PVX:[id number] DOB: 1961-01-04 DOA: 03/09/2019  PCP: Shelda Pal, DO Patient coming from: Home   I have personally briefly reviewed patient's old medical records in [place]   Chief Complaint: Fall, left leg deformity  HPI: Madeline Chandler is a 58 y.o. female with past medical history significant for arthritis, breast cancer status post chemo and radiation, diabetes, GERD, hypertension patient presents with near syncope, syncope.  Patient states that she was lying in bed and started having abdominal pain, started having diarrhea.  She went to the bathroom, she was feeling hot, sweaty, she sat in the toilet, put her elbow in the sink, head down and fell off and  hit her left leg on a cabinet.  She thinks that she might have passed out.  She complaining of 8 out of 10 pain. She denies chest pain, shortness of breath, prior to the episode she only felt lightheaded.  She denies chest pain or shortness of breath on ambulation when she goes to the shopping mall or when she walks at home. She has had prior episode of diarrhea in the past. Currently she denies abdominal pain, no fever diarrhea.  She seen that she ate some chicken wings that upset her stomach.  Evaluation in the ED: 88, potassium 2.8, chloride 96, creatinine 1.2, hemoglobin 12, white blood cell 9.6 Chest x-ray: Low lung volume bilateral atelectasis Tibia-fibula x-ray left: Comminuted displaced oblique fracture through the distal tibial diaphysis. Comminuted displaced fracture through the proximal fibular diaphysis.  Review of Systems: All systems reviewed and apart from history of presenting illness, are negative.  Past Medical History:  Diagnosis Date  . Anxiety   . Arthritis    "hips" (10/28/2016)  . Breast cancer, right breast ([facility]) 1997; 2018   S/P chemo & radiation;   . Bulging lumbar disc    2  . Depression   . Diabetes mellitus without complication ([facility])   .  GERD (gastroesophageal reflux disease)   . History of colonic polyps    Dr Collene Mares  . Hypertension   . Migraine    "none in the last few years; had them mostly in late 20's/mid 30's" (10/28/2016)  . OSA (obstructive sleep apnea) 02/19/2018  . Pre-diabetes   . Restless leg syndrome    Past Surgical History:  Procedure Laterality Date  . ABDOMINAL HYSTERECTOMY  2000s  . ANTERIOR CERVICAL DECOMP/DISCECTOMY FUSION N/A 12/02/2015   Procedure: ANTERIOR CERVICAL DECOMPRESSION/DISCECTOMY FUSION C4 - C7  3 LEVELS;  Surgeon: Melina Schools, MD;  Location: [place] [facility];  Service: Orthopedics;  Laterality: N/A;  . BACK SURGERY    . BREAST BIOPSY Right 1997; 08/2016  . BREAST LUMPECTOMY Right 1997  . BREAST RECONSTRUCTION Right 07/07/2017   Procedure: RIGHT NIPPLE AEROLA RECONSTRUCTION WITH LOCAL FLAP FROM RIGHT GROIN;  Surgeon: Irene Limbo, MD;  Location: [place];  Service: Plastics;  Laterality: Right;  . BREAST REDUCTION SURGERY Left 03/07/2017   Procedure: LEFT BREAST REDUCTION FOR SYMMETRY;  Surgeon: Irene Limbo, MD;  Location: [facility];  Service: Plastics;  Laterality: Left;  . COLONOSCOPY W/ POLYPECTOMY  2010   Dr Collene Mares  . DILATION AND CURETTAGE OF UTERUS  1990s  . JOINT REPLACEMENT    . LATISSIMUS FLAP TO BREAST Right 10/28/2016   Procedure: LATISSIMUS FLAP TO BREAST WITH PLACEMENT OF TISSUE EXPANDER ON THE RIGHT;  Surgeon: Irene Limbo, MD;  Location: [place] [facility];  Service: Plastics;  Laterality: Right;  RFNA  . MASTECTOMY Right 10/28/2016  . RECONSTRUCTION BREAST W/ LATISSIMUS DORSI FLAP Right 10/28/2016    WITH PLACEMENT OF TISSUE EXPANDER   . REMOVAL OF BILATERAL TISSUE EXPANDERS WITH PLACEMENT OF BILATERAL BREAST IMPLANTS Right 03/07/2017   Procedure: REMOVAL OF RIGHT BREAST TISSUE EXPANDER WITH PLACEMENT OF RIGHT BREAST IMPLANT;  Surgeon: Irene Limbo, MD;  Location: [place];  Service: Plastics;  Laterality: Right;  . TISSUE EXPANDER  PLACEMENT Right 10/28/2016   Procedure: TISSUE EXPANDER;  Surgeon: Irene Limbo, MD;  Location: [facility];  Service: Plastics;  Laterality: Right;  . TOTAL HIP ARTHROPLASTY Right 05/12/2015   Procedure: RIGHT TOTAL HIP ARTHROPLASTY ANTERIOR APPROACH;  Surgeon: Paralee Cancel, MD;  Location: [facility];  Service: Orthopedics;  Laterality: Right;  . TOTAL HIP ARTHROPLASTY Left 06/23/2015   Procedure: LEFT TOTAL HIP ARTHROPLASTY ANTERIOR APPROACH;  Surgeon: Paralee Cancel, MD;  Location: [facility];  Service: Orthopedics;  Laterality: Left;  . TOTAL MASTECTOMY Right 10/28/2016   Procedure: RIGHT MASTECTOMY;  Surgeon: Alphonsa Overall, MD;  Location: [place];  Service: General;  Laterality: Right;   Social History:  reports that she has never smoked. She has never used smokeless tobacco. She reports that she does not drink alcohol or use drugs.   Allergies  Allergen Reactions  . Prednisone     Family History  Problem Relation Age of Onset  . Hypertension Mother   . Diabetes Mother   . Breast cancer Mother 17  . Hypertension Sister   . Hypertension Brother        X 3  . Cancer Maternal Aunt        leukemia  . Breast cancer Paternal Aunt        diagnosed in 11s or 59s  . Cancer Paternal Uncle        unknown type  . Cancer Paternal Aunt        unknown type  . Breast cancer Cousin 17  . Stroke Neg Hx   . Heart disease Neg Hx     Prior to Admission medications   Medication Sig Start Date End Date Taking? Authorizing Provider  amLODipine (NORVASC) 5 MG tablet TAKE 1 TABLET BY MOUTH EVERY DAY 09/17/18   Wendling, Crosby Oyster, DO  amphetamine-dextroamphetamine (ADDERALL) 20 MG tablet Take 20 mg by mouth daily. 06/21/18   [provider]  atorvastatin (LIPITOR) 40 MG tablet TAKE 1 TABLET BY MOUTH EVERY DAY 12/17/18   Wendling, Crosby Oyster, DO  Calcium Carb-Cholecalciferol (CALTRATE 600+D) 600-800 MG-UNIT TABS Take by mouth daily.    [provider]  carvedilol (COREG) 25 MG tablet TAKE  1 TABLET (25 MG TOTAL) BY MOUTH 2 (TWO) TIMES DAILY WITH A MEAL. 02/27/19   Shelda Pal, DO  DULoxetine (CYMBALTA) 30 MG capsule TAKE 1 CAPSULE BY MOUTH EVERY DAY 01/03/19   Wendling, Crosby Oyster, DO  glucose blood (ONETOUCH VERIO) test strip Use once daily to check blood sugar.  DX E11.9 03/07/18   Shelda Pal, DO  hydrochlorothiazide (HYDRODIURIL) 50 MG tablet Take 50 mg by mouth daily.    [provider]  letrozole (FEMARA) 2.5 MG tablet Take 1 tablet (2.5 mg total) by mouth daily. 02/04/19   Ladell Pier, MD  losartan-hydrochlorothiazide (HYZAAR) 100-25 MG tablet TAKE 1 TABLET BY MOUTH EVERY DAY 12/24/18   Shelda Pal, DO  meloxicam (MOBIC) 15 MG tablet TAKE 1 TABLET BY MOUTH EVERY DAY 11/05/18   Wendling, Crosby Oyster, DO  [place] LANCETS  FINE MISC Use as directed once daily to check blood sugar.  DXE11.9 04/27/18   Shelda Pal, DO  pantoprazole (PROTONIX) 40 MG tablet TAKE 1 TABLET BY MOUTH EVERY DAY IN THE EVENING 01/02/19   Wendling, Crosby Oyster, DO  Semaglutide, 1 MG/DOSE, (OZEMPIC, 1 MG/DOSE,) 2 MG/1.5ML SOPN Inject 1 mg into the skin once a week. 09/24/18   Shelda Pal, DO   Physical Exam: Vitals:   03/09/19 0730 03/09/19 0800 03/09/19 0830 03/09/19 0900  BP: 128/70 (!) 106/59 108/63 111/64  Pulse: 72 79 77 74  Resp: '18 11 16 15  ' Temp:      TempSrc:      SpO2: 98% 93% 94% 90%  Weight:      Height:         General exam: Moderately built and nourished patient, lying comfortably supine on the gurney in no obvious distress.  Head, eyes and ENT: Nontraumatic and normocephalic. Pupils equally reacting to light and accommodation. Oral mucosa moist.  Neck: Supple. No JVD, carotid bruit or thyromegaly.  Lymphatics: No lymphadenopathy.  Respiratory system: Clear to auscultation. No increased work of breathing.  Cardiovascular system: S1 and S2 heard, RRR. No JVD, murmurs, gallops, clicks or pedal edema.   Gastrointestinal system: Abdomen is nondistended, soft and nontender. Normal bowel sounds heard. No organomegaly or masses appreciated.  Central nervous system: Alert and oriented. No focal neurological deficits.  Extremities: Left lower extremity in a splint  Skin: No rashes or acute findings.  Musculoskeletal system: Negative exam.  Psychiatry: Pleasant and cooperative.   Labs on Admission:  Basic Metabolic Panel: Recent Labs  Lab 03/09/19 0634  NA 138  K 2.8*  CL 96*  CO2 32  GLUCOSE 106*  BUN 19  CREATININE 1.20*  CALCIUM 10.2  MG 2.2   Liver Function Tests: No results for input(s): AST, ALT, ALKPHOS, BILITOT, PROT, ALBUMIN in the last 168 hours. No results for input(s): LIPASE, AMYLASE in the last 168 hours. No results for input(s): AMMONIA in the last 168 hours. CBC: Recent Labs  Lab 03/09/19 0634  WBC 9.6  NEUTROABS 6.4  HGB 12.1  HCT 38.1  MCV 90.3  PLT 226   Cardiac Enzymes: No results for input(s): CKTOTAL, CKMB, CKMBINDEX, TROPONINI in the last 168 hours.  BNP (last 3 results) No results for input(s): PROBNP in the last 8760 hours. CBG: No results for input(s): GLUCAP in the last 168 hours.  Radiological Exams on Admission: Dg Chest 1 View  Result Date: 03/09/2019 CLINICAL DATA:  Patient status post fall. EXAM: CHEST  1 VIEW COMPARISON:  Chest radiograph 02/19/2018 FINDINGS: Monitoring leads overlie the patient. Stable cardiac and mediastinal contours. Stable cardiac and mediastinal contours. Elevation right hemidiaphragm. Bibasilar heterogeneous opacities. No pleural effusion or pneumothorax. Postsurgical changes right axilla. IMPRESSION: Low lung volumes with bibasilar atelectasis. Electronically Signed   By: Lovey Newcomer M.D.   On: 03/09/2019 06:11   Dg Tibia/fibula Left  Result Date: 03/09/2019 CLINICAL DATA:  Patient status post fall. EXAM: LEFT TIBIA AND FIBULA - 2 VIEW COMPARISON:  None. FINDINGS: There is a comminuted angulated displaced  fracture of the distal tibial diaphysis with overlying soft tissue swelling. There is a comminuted displaced fracture of the proximal fibular diaphysis with overlying soft tissue swelling. IMPRESSION: Comminuted displaced oblique fracture through the distal tibial diaphysis. Comminuted displaced fracture through the proximal fibular diaphysis. Electronically Signed   By: Lovey Newcomer M.D.   On: 03/09/2019 06:13    EKG: Independently reviewed.  Sinus rhythm normal axis.  Assessment/Plan Principal Problem:   Ankle fracture Active Problems:   Hypertension   Diabetes mellitus type 2 in obese (HCC)   Near syncope   Syncope   Hypokalemia  1-Syncope, near syncope: Episodes likely sounds vasovagal. EKG sinus rhythm. Will check 1 set of cardiac enzymes. Monitor on telemetry, continue with IV fluids.  Hold blood pressure medication.  2-Hypokalemia: Replete with 4 g of IV KCl and 40 mEq oral. Repeat be met prior to surgery this afternoon. IV fluids with KCl.  3-Left ankle fracture: Patient is low to moderate risk for procedure.  May proceed today if potassium level improved and blood pressure is stable. Plan for surgery today. PRN Dilaudid for pain.  4-Hypertension: Hold Norvasc, hydrochlorothiazide, Cozaar. We will resume Coreg lower dose to avoid hypotension.  5-Diarrhea: Patient received IV fluids. Suspect episode of gastroenteritis. No further episode in the ED.   DVT Prophylaxis: SCDs for now, plus surgery DVT prophylaxis per Ortho. Code Status: Resume full code Family Communication: Family was at bedside Disposition Plan: Admit to [facility] for surgery today.,  Replete IV potassium, IV fluids.  Time spent: 75 minutes.      Elmarie Shiley MD [facility]   03/09/2019, 9:12 AM

## 2019-03-09 NOTE — Progress Notes (Signed)
Debbie, [place] RN took ear rings x 2 to PACU. Glasses remain on at patient request and will taken care of by Jackelyn Poling, RN

## 2019-03-09 NOTE — ED Notes (Addendum)
Pt's daughter: Antonea Gaut, phone # [phone], would like to be contacted when patient has a room.

## 2019-03-09 NOTE — ED Provider Notes (Signed)
Case discussed with Dr Ninfa Linden.  Pt has been seen by [facility].  I spoke with Dr Lyla Glassing and he reviewed the films.  He has contacted Dr Lucia Gaskins regarding the patient as Dr Lucia Gaskins specialized in foot and ankle injury.  Requests admission to [facility]    Dorie Rank, MD 03/09/19 [phone]

## 2019-03-09 NOTE — Progress Notes (Signed)
Orthopedic Tech Progress Note Patient Details:  Madeline Chandler May 16, 1961 [id number]  Ortho Devices Type of Ortho Device: CAM walker Splint Material: Fiberglass Ortho Device/Splint Location: LLE Ortho Device/Splint Interventions: Adjustment, Application, Ordered   Post Interventions Patient Tolerated: Well Instructions Provided: Care of device, Adjustment of device   [name] 03/09/2019, 6:57 PM

## 2019-03-09 NOTE — Transfer of Care (Signed)
Immediate Anesthesia Transfer of Care Note  Patient: Madeline Chandler  Procedure(s) Performed: INTRAMEDULLARY (IM) NAIL TIBIAL (Left Leg Lower)  Patient Location: PACU  Anesthesia Type: MAC with block  Level of Consciousness: drowsy  Airway & Oxygen Therapy: Patient Spontanous Breathing and Patient connected to face mask oxygen  Post-op Assessment: Report given to RN and Post -op Vital signs reviewed and stable  Post vital signs: Reviewed and stable  Last Vitals:  Vitals Value Taken Time  BP 121/82 03/09/19 1630  Temp 36.4 C 03/09/19 1630  Pulse 83 03/09/19 1635  Resp 12 03/09/19 1635  SpO2 100 % 03/09/19 1635  Vitals shown include unvalidated device data.  Last Pain:  Vitals:   03/09/19 1630  TempSrc:   PainSc: Asleep         Complications: No apparent anesthesia complications

## 2019-03-09 NOTE — Op Note (Signed)
Madeline Chandler female 58 y.o. 03/09/2019  PreOperative Diagnosis: Left comminuted distal tibia and proximal fibula fractures  PostOperative Diagnosis: Same  PROCEDURE: Intramedullary nail of left tibia fracture  SURGEON: Melony Overly, MD  ASSISTANT: None  ANESTHESIA: MAC with peripheral nerve block  FINDINGS: Comminuted distal tibia metadiaphyseal fracture with proximal fibula fracture  IMPLANTS: [employer] [place] tibial nail size 9 x 2  INDICATIONS:58 y.o. female was in bed last night and felt like she had to use the restroom.  She got up to use the restroom and began to get dizzy and diaphoretic.  Ultimately she thinks she passed out and hit her leg on the cabinet.  She has a history of diabetes and breast cancer and hypertension but otherwise is healthy.  She does not smoke cigarettes.  She was taken to the emergency department and diagnosed with a distal tibia fracture that was comminuted.  Orthopedics was consulted.  On my evaluation patient complained of pain in the left leg.  She states it was better with immobilization and pain medication.  Motion causes pain.  She denies any pain in her left knee or thigh.  No bilateral upper extremity discomfort or right lower extremity pain.  Due to the fracture she was indicated for intramedullary nailing.  This was an extra-articular fracture.  We discussed the risk benefits alternatives surgery which include but are not limited to wound healing complications, infection, nonunion, malunion, need for further surgery, damage surrounding structures, knee pain and continued pain.  We also discussed the perioperative and anesthetic risk which include death.  After weighing these risks he opted proceed with surgery.  We discussed the postoperative weightbearing restrictions which she agreed to comply with.  PROCEDURE: Patient was identified in the preoperative holding area.  The left leg was marked by myself.  Consent was signed myself  and the patient.  Anesthesia performed peripheral nerve block.  She was taken to the operative suite placed upon the operative table.  Thigh tourniquet was placed in the left thigh.  A bump was placed on the left hip and bone foam was used.  MAC anesthesia was induced without difficulty.  The left lower extremity was prepped and draped in the usual sterile fashion.  Preoperative antibiotics were given.  Timeout was performed.  We began by making the lateral parapatellar incision.  This taken sharply down through skin and subcutaneous tissue.  Blunt dissection was used to identify the patella tendon.  Then the subpatellar tendon tissue was dissected and an interval between the patella tendon and the knee capsule was created.  Then using the awl the appropriate starting position the proximal tibia was obtained and the tibia was entered.  The bead tip guidewire was placed down the tibia and using fluoroscopy the fracture was reduced with manual traction, rotation and a slight varus pressure.  The guide tip was taken across the fracture into a center center position of the distal tibia.  Then the tibia was reamed sequentially up to a 10.5 mm reamer.  There was chatter at the isthmus with this.  We then placed a 340 mm nail that was 9 mm in diameter.  After placement of the nail the fracture was acceptably reduced.  It was not in varus.  We then placed 2 proximal interlocking screws and 3 distal interlocking screws without difficulty using the perfect circle technique distally.  The interlocking screws had acceptable bite.  Then final fluoroscopic images were obtained and the fracture was found to be adequately  reduced and stabilized.  The proximal fibula fracture was in an acceptable position.  The wounds were then irrigated with normal saline and the tissue about the patellar tendon was repaired with a 2-0 PDS suture.  The subcuticular tissue was closed with a 2-0 Monocryl and the skin with staples.  Xeroform was  placed on the wounds.  4 x 4's and sterile she cotton as well as a 6 inch Ace wrap was placed about the leg.  She tolerated the procedure well.  The counts were correct at the end the case.  There were no complications.  She was awakened from anesthesia and taken to recovery in stable condition.  POST OPERATIVE INSTRUCTIONS: Touchdown weightbearing to left lower extremity She will be given a cam walker boot when mobilizing Keep dressing in place and reinforce as needed She will be admitted to the hospitalist team for syncopal work-up and medical management Okay for discharge from orthopedic standpoint once cleared from physical therapy, pain controlled and cleared medically She will follow-up with me in 2 weeks for wound check. Lovenox for DVT prophylaxis while in house and post discharge for 2 weeks  TOURNIQUET TIME: No tourniquet was used  BLOOD LOSS:  150 cc         DRAINS: none         SPECIMEN: none       COMPLICATIONS:  * No complications entered in OR log *         Disposition: PACU - hemodynamically stable.         Condition: stable

## 2019-03-09 NOTE — Consult Note (Signed)
Reason for Consult: Left distal tibia fracture with proximal fibula fracture Referring Physician: [facility]  Madeline Chandler is an 58 y.o. female.  HPI: Patient was using the restroom this morning and began to have some dizziness and lightheaded feelings.  She states she was about to pass out and then noted that she had hit her leg on the cabinet and had pain and deformity of her leg.  She was brought to the emergency department where x-rays revealed a comminuted and displaced distal tibia fracture with proximal fibula fracture.  Given the injury orthopedics was consulted.  Patient states she has pain in the left leg that is improved with pain medication.  She denies any numbness or tingling.  She denies that there were any open wounds at the time of her injury.  She denies any other joint or extremity pain.  She denies any recent fevers or chills.  Patient states she has a history of diabetes and high blood pressure but otherwise no significant medical comorbidities.  Patient did have a history of breast cancer status post mastectomy.  In the emergency department the patient had a low potassium and increased creatinine.  She was evaluated by the hospitalist team and cleared for surgery and was given potassium supplementation.    Past Medical History:  Diagnosis Date  . Anxiety   . Arthritis    "hips" (10/28/2016)  . Breast cancer, right breast ([place]) 1997; 2018   S/P chemo & radiation;   . Bulging lumbar disc    2  . Depression   . Diabetes mellitus without complication ([place])   . GERD (gastroesophageal reflux disease)   . History of colonic polyps    Dr Collene Mares  . Hypertension   . Migraine    "none in the last few years; had them mostly in late 20's/mid 30's" (10/28/2016)  . OSA (obstructive sleep apnea) 02/19/2018  . Pre-diabetes   . Restless leg syndrome     Past Surgical History:  Procedure Laterality Date  . ABDOMINAL HYSTERECTOMY  2000s  . ANTERIOR CERVICAL  DECOMP/DISCECTOMY FUSION N/A 12/02/2015   Procedure: ANTERIOR CERVICAL DECOMPRESSION/DISCECTOMY FUSION C4 - C7  3 LEVELS;  Surgeon: Melina Schools, MD;  Location: [place];  Service: Orthopedics;  Laterality: N/A;  . BACK SURGERY    . BREAST BIOPSY Right 1997; 08/2016  . BREAST LUMPECTOMY Right 1997  . BREAST RECONSTRUCTION Right 07/07/2017   Procedure: RIGHT NIPPLE AEROLA RECONSTRUCTION WITH LOCAL FLAP FROM RIGHT GROIN;  Surgeon: Irene Limbo, MD;  Location: [place];  Service: Plastics;  Laterality: Right;  . BREAST REDUCTION SURGERY Left 03/07/2017   Procedure: LEFT BREAST REDUCTION FOR SYMMETRY;  Surgeon: Irene Limbo, MD;  Location: [place];  Service: Plastics;  Laterality: Left;  . COLONOSCOPY W/ POLYPECTOMY  2010   Dr Collene Mares  . DILATION AND CURETTAGE OF UTERUS  1990s  . JOINT REPLACEMENT    . LATISSIMUS FLAP TO BREAST Right 10/28/2016   Procedure: LATISSIMUS FLAP TO BREAST WITH PLACEMENT OF TISSUE EXPANDER ON THE RIGHT;  Surgeon: Irene Limbo, MD;  Location: [place];  Service: Plastics;  Laterality: Right;  RFNA  . MASTECTOMY Right 10/28/2016  . RECONSTRUCTION BREAST W/ LATISSIMUS DORSI FLAP Right 10/28/2016    WITH PLACEMENT OF TISSUE EXPANDER   . REMOVAL OF BILATERAL TISSUE EXPANDERS WITH PLACEMENT OF BILATERAL BREAST IMPLANTS Right 03/07/2017   Procedure: REMOVAL OF RIGHT BREAST TISSUE EXPANDER WITH PLACEMENT OF RIGHT BREAST IMPLANT;  Surgeon: Irene Limbo,  MD;  Location: [place];  Service: Plastics;  Laterality: Right;  . TISSUE EXPANDER PLACEMENT Right 10/28/2016   Procedure: TISSUE EXPANDER;  Surgeon: Irene Limbo, MD;  Location: [place];  Service: Plastics;  Laterality: Right;  . TOTAL HIP ARTHROPLASTY Right 05/12/2015   Procedure: RIGHT TOTAL HIP ARTHROPLASTY ANTERIOR APPROACH;  Surgeon: Paralee Cancel, MD;  Location[facility];  Service: Orthopedics;  Laterality: Right;  . TOTAL HIP ARTHROPLASTY Left 06/23/2015    Procedure: LEFT TOTAL HIP ARTHROPLASTY ANTERIOR APPROACH;  Surgeon: Paralee Cancel, MD;  Location: [facility];  Service: Orthopedics;  Laterality: Left;  . TOTAL MASTECTOMY Right 10/28/2016   Procedure: RIGHT MASTECTOMY;  Surgeon: Alphonsa Overall, MD;  Location: [place];  Service: General;  Laterality: Right;    Family History  Problem Relation Age of Onset  . Hypertension Mother   . Diabetes Mother   . Breast cancer Mother 24  . Hypertension Sister   . Hypertension Brother        X 3  . Cancer Maternal Aunt        leukemia  . Breast cancer Paternal Aunt        diagnosed in 38s or 65s  . Cancer Paternal Uncle        unknown type  . Cancer Paternal Aunt        unknown type  . Breast cancer Cousin 67  . Stroke Neg Hx   . Heart disease Neg Hx     Social History:  reports that she has never smoked. She has never used smokeless tobacco. She reports that she does not drink alcohol or use drugs.  Allergies:  Allergies  Allergen Reactions  . Prednisone     Medications: I have reviewed the patient's current medications.  Results for orders placed or performed during the hospital encounter of 03/09/19 (from the past 48 hour(s))  CBC with Differential/Platelet     Status: None   Collection Time: 03/09/19  6:34 AM  Result Value Ref Range   WBC 9.6 4.0 - 10.5 K/uL   RBC 4.22 3.87 - 5.11 MIL/uL   Hemoglobin 12.1 12.0 - 15.0 g/dL   HCT 38.1 36.0 - 46.0 %   MCV 90.3 80.0 - 100.0 fL   MCH 28.7 26.0 - 34.0 pg   MCHC 31.8 30.0 - 36.0 g/dL   RDW 13.5 11.5 - 15.5 %   Platelets 226 150 - 400 K/uL   nRBC 0.0 0.0 - 0.2 %   Neutrophils Relative % 68 %   Neutro Abs 6.4 1.7 - 7.7 K/uL   Lymphocytes Relative 23 %   Lymphs Abs 2.2 0.7 - 4.0 K/uL   Monocytes Relative 8 %   Monocytes Absolute 0.8 0.1 - 1.0 K/uL   Eosinophils Relative 1 %   Eosinophils Absolute 0.1 0.0 - 0.5 K/uL   Basophils Relative 0 %   Basophils Absolute 0.0 0.0 - 0.1 K/uL   Immature Granulocytes 0 %   Abs Immature Granulocytes  0.03 0.00 - 0.07 K/uL    Comment: Performed at [facility], [place] [address], [place], [place] [address]  Basic metabolic panel     Status: Abnormal   Collection Time: 03/09/19  6:34 AM  Result Value Ref Range   Sodium 138 135 - 145 mmol/L   Potassium 2.8 (L) 3.5 - 5.1 mmol/L   Chloride 96 (L) 98 - 111 mmol/L   CO2 32 22 - 32 mmol/L   Glucose, Bld 106 (H) 70 - 99  mg/dL   BUN 19 6 - 20 mg/dL   Creatinine, Ser 1.20 (H) 0.44 - 1.00 mg/dL   Calcium 10.2 8.9 - 10.3 mg/dL   GFR calc non Af Amer 49 (L) >60 mL/min   GFR calc Af Amer 57 (L) >60 mL/min   Anion gap 10 5 - 15    Comment: Performed at [facility], [place] [address], [place], [place] [address]  Magnesium     Status: None   Collection Time: 03/09/19  6:34 AM  Result Value Ref Range   Magnesium 2.2 1.7 - 2.4 mg/dL    Comment: Performed at [facility], [place] [address] [place], [place] [address]  SARS Coronavirus 2 [facility] order, Performed in [facility] hospital lab) Nasopharyngeal Nasopharyngeal Swab     Status: None   Collection Time: 03/09/19  7:11 AM   Specimen: Nasopharyngeal Swab  Result Value Ref Range   SARS Coronavirus 2 NEGATIVE NEGATIVE    Comment: (NOTE) If result is NEGATIVE SARS-CoV-2 target nucleic acids are NOT DETECTED. The SARS-CoV-2 RNA is generally detectable in upper and lower  respiratory specimens during the acute phase of infection. The lowest  concentration of SARS-CoV-2 viral copies this assay can detect is 250  copies / mL. A negative result does not preclude SARS-CoV-2 infection  and should not be used as the sole basis for treatment or other  patient management decisions.  A negative result may occur with  improper specimen collection / handling, submission of specimen other  than nasopharyngeal swab, presence of viral mutation(s) within the  areas targeted by this assay, and inadequate number of viral copies  (<250 copies / mL). A negative  result must be combined with clinical  observations, patient history, and epidemiological information. If result is POSITIVE SARS-CoV-2 target nucleic acids are DETECTED. The SARS-CoV-2 RNA is generally detectable in upper and lower  respiratory specimens dur ing the acute phase of infection.  Positive  results are indicative of active infection with SARS-CoV-2.  Clinical  correlation with patient history and other diagnostic information is  necessary to determine patient infection status.  Positive results do  not rule out bacterial infection or co-infection with other viruses. If result is PRESUMPTIVE POSTIVE SARS-CoV-2 nucleic acids MAY BE PRESENT.   A presumptive positive result was obtained on the submitted specimen  and confirmed on repeat testing.  While 2019 novel coronavirus  (SARS-CoV-2) nucleic acids may be present in the submitted sample  additional confirmatory testing may be necessary for epidemiological  and / or clinical management purposes  to differentiate between  SARS-CoV-2 and other Sarbecovirus currently known to infect humans.  If clinically indicated additional testing with an alternate test  methodology [id number]) is advised. The SARS-CoV-2 RNA is generally  detectable in upper and lower respiratory sp ecimens during the acute  phase of infection. The expected result is Negative. Fact Sheet for Patients:  StrictlyIdeas.no Fact Sheet for Healthcare Providers: BankingDealers.co.za This test is not yet approved or cleared by the [place] FDA and has been authorized for detection and/or diagnosis of SARS-CoV-2 by FDA under an Emergency Use Authorization (EUA).  This EUA will remain in effect (meaning this test can be used) for the duration of the COVID-19 declaration under Section 564(b)(1) of the Act, 21 U.S.C. section 360bbb-3(b)(1), unless the authorization is terminated or revoked sooner. Performed at [facility], [place] [address] [place], [place] [address]   CBG monitoring, ED     Status:  None   Collection Time: 03/09/19  9:52 AM  Result Value Ref Range   Glucose-Capillary 87 70 - 99 mg/dL  Troponin I (High Sensitivity)     Status: None   Collection Time: 03/09/19  9:57 AM  Result Value Ref Range   Troponin I (High Sensitivity) 5 <18 ng/L    Comment: (NOTE) Elevated high sensitivity troponin I (hsTnI) values and significant  changes across serial measurements may suggest ACS but many other  chronic and acute conditions are known to elevate hsTnI results.  Refer to the "Links" section for chest pain algorithms and additional  guidance. Performed at [facility], [place] [address] [place], [place] [address]     Dg Chest 1 View  Result Date: 03/09/2019 CLINICAL DATA:  Patient status post fall. EXAM: CHEST  1 VIEW COMPARISON:  Chest radiograph 02/19/2018 FINDINGS: Monitoring leads overlie the patient. Stable cardiac and mediastinal contours. Stable cardiac and mediastinal contours. Elevation right hemidiaphragm. Bibasilar heterogeneous opacities. No pleural effusion or pneumothorax. Postsurgical changes right axilla. IMPRESSION: Low lung volumes with bibasilar atelectasis. Electronically Signed   By: Lovey Newcomer M.D.   On: 03/09/2019 06:11   Dg Tibia/fibula Left  Result Date: 03/09/2019 CLINICAL DATA:  Patient status post fall. EXAM: LEFT TIBIA AND FIBULA - 2 VIEW COMPARISON:  None. FINDINGS: There is a comminuted angulated displaced fracture of the distal tibial diaphysis with overlying soft tissue swelling. There is a comminuted displaced fracture of the proximal fibular diaphysis with overlying soft tissue swelling. IMPRESSION: Comminuted displaced oblique fracture through the distal tibial diaphysis. Comminuted displaced fracture through the proximal fibular diaphysis. Electronically Signed   By: Lovey Newcomer M.D.   On: 03/09/2019 06:13    Review of  Systems  Constitutional: Negative.   Eyes: Negative.   Respiratory: Negative.   Cardiovascular: Negative.   Gastrointestinal: Negative.   Musculoskeletal:       Left leg pain  Skin: Negative.   Neurological: Negative.   Psychiatric/Behavioral: Negative.    Blood pressure 123/78, pulse 73, temperature 97.9 F (36.6 C), temperature source Oral, resp. rate (!) 7, height 5\' 5"  (1.651 m), weight 97.5 kg, SpO2 99 %. Physical Exam  Constitutional: She appears well-developed.  HENT:  Head: Normocephalic.  Eyes: Conjunctivae are normal.  Neck: Neck supple.  Cardiovascular: Normal rate.  Respiratory: Effort normal.  GI: Soft.  Musculoskeletal:     Comments: Left lower extremity in a splint.  Toes exposed are warm and well-perfused.  She is able to wiggle her toes.  She endorses sensation on the dorsal plantar surface of the toes.  No pain with passive hallux stretching.  No evidence of injury proximal to the splint.  No tenderness palpation about the knee.  No tenderness about the thigh.  No evidence of right lower extremity or bilateral upper extremity injuries that are acute.    Neurological: She is alert.  Skin: Skin is warm.  Psychiatric: She has a normal mood and affect.    Assessment/Plan: Patient is indicated for intramedullary nailing of her left tibia fracture.  She was transferred to [facility] for definitive surgical treatment and admission to the hospitalist team postoperatively.  I spoke with the hospitalist and she was cleared for surgery after potassium supplementation.  Patient does have a history of diabetes and A1c from January 2020 was 6.8.  Patient has not had her A1c tested since then.  We discussed the risk, benefits and alternatives to surgery which include but are not limited to  wound healing complications, infection, nonunion, malunion, need for further surgery demonstrating structures.  We also discussed the perioperative and anesthetic risk which include  death.  We also discussed the postoperative weightbearing restrictions and the need for her to be touchdown weightbearing and she agrees to comply.  Postoperatively she will work with physical therapy for mobilization and disposition planning.  She opted to proceed with surgery.  Erle Crocker 03/09/2019, 1:49 PM

## 2019-03-09 NOTE — Anesthesia Preprocedure Evaluation (Signed)
Anesthesia Evaluation  Patient identified by MRN, date of birth, ID band Patient awake    Reviewed: Allergy & Precautions, NPO status , Patient's Chart, lab work & pertinent test results  Airway Mallampati: I  TM Distance: >3 FB Neck ROM: Full    Dental   Pulmonary sleep apnea ,    Pulmonary exam normal        Cardiovascular hypertension, Pt. on medications Normal cardiovascular exam     Neuro/Psych Anxiety Depression    GI/Hepatic GERD  Medicated and Controlled,  Endo/Other  diabetes, Type 2  Renal/GU      Musculoskeletal   Abdominal   Peds  Hematology   Anesthesia Other Findings   Reproductive/Obstetrics                             Anesthesia Physical Anesthesia Plan  ASA: II  Anesthesia Plan: General   Post-op Pain Management:  Regional for Post-op pain   Induction:   PONV Risk Score and Plan: 3  Airway Management Planned: LMA  Additional Equipment:   Intra-op Plan:   Post-operative Plan: Extubation in OR  Informed Consent: I have reviewed the patients History and Physical, chart, labs and discussed the procedure including the risks, benefits and alternatives for the proposed anesthesia with the patient or authorized representative who has indicated his/her understanding and acceptance.       Plan Discussed with: CRNA and Surgeon  Anesthesia Plan Comments:         Anesthesia Quick Evaluation

## 2019-03-09 NOTE — ED Notes (Signed)
EKG given to EDP,Molpus,MD., for review. 

## 2019-03-09 NOTE — Progress Notes (Signed)
Returned call to Tenicia Gural @[phone] sister of patient. She asked if orthopedic surgeon Dr. Lucia Gaskins can give her a call on tomorrow when he makes his rounds for what was done in surgery.  Guadlupe Spanish [phone]

## 2019-03-09 NOTE — ED Provider Notes (Signed)
[facility] Provider Note: Georgena Spurling, MD, FACEP  CSN: [id number] MRN: [id number] ARRIVAL: 03/09/19 at 0437 ROOM: [id number]/5N19C-01   CHIEF COMPLAINT  Leg Injury   HISTORY OF PRESENT ILLNESS  03/09/19 5:29 AM Madeline Chandler is a 58 y.o. female who got up this morning to move her bowels because she had the sudden onset of loose stools.  She got hot and sweaty while sitting on the toilet.  When she got up off the toilet after moving her bowels she had a syncopal or near syncopal episode during which she fell.  She felt a "whooshing" sensation in her left lower leg.  She is now having severe pain and deformity to her left lower leg.  She rates her pain as an 8 out of 10 now.  It was transiently improved with 200 mcg of fentanyl given by EMS prior to arrival but the pain is now returning.  She denies numbness or weakness distal to the injury.  She denies other injury.  She denies abdominal pain but is having the sensation she needs to move her bowels again.  She denies chest pain or shortness of breath at any time.  She denies nausea or vomiting.  Past Medical History:  Diagnosis Date  . Anxiety   . Arthritis    "hips" (10/28/2016)  . Breast cancer, right breast ([place]) 1997; 2018   S/P chemo & radiation;   . Bulging lumbar disc    2  . Depression   . Diabetes mellitus without complication ([place])   . GERD (gastroesophageal reflux disease)   . History of colonic polyps    Dr Collene Mares  . Hypertension   . Migraine    "none in the last few years; had them mostly in late 20's/mid 30's" (10/28/2016)  . OSA (obstructive sleep apnea) 02/19/2018  . Pre-diabetes   . Restless leg syndrome     Past Surgical History:  Procedure Laterality Date  . ABDOMINAL HYSTERECTOMY  2000s  . ANTERIOR CERVICAL DECOMP/DISCECTOMY FUSION N/A 12/02/2015   Procedure: ANTERIOR CERVICAL DECOMPRESSION/DISCECTOMY FUSION C4 - C7  3 LEVELS;  Surgeon: Melina Schools, MD;  Location: [facility];  Service: Orthopedics;   Laterality: N/A;  . BACK SURGERY    . BREAST BIOPSY Right 1997; 08/2016  . BREAST LUMPECTOMY Right 1997  . BREAST RECONSTRUCTION Right 07/07/2017   Procedure: RIGHT NIPPLE AEROLA RECONSTRUCTION WITH LOCAL FLAP FROM RIGHT GROIN;  Surgeon: Irene Limbo, MD;  Location: [facility];  Service: Plastics;  Laterality: Right;  . BREAST REDUCTION SURGERY Left 03/07/2017   Procedure: LEFT BREAST REDUCTION FOR SYMMETRY;  Surgeon: Irene Limbo, MD;  Location: [facility];  Service: Plastics;  Laterality: Left;  . COLONOSCOPY W/ POLYPECTOMY  2010   Dr Collene Mares  . DILATION AND CURETTAGE OF UTERUS  1990s  . JOINT REPLACEMENT    . LATISSIMUS FLAP TO BREAST Right 10/28/2016   Procedure: LATISSIMUS FLAP TO BREAST WITH PLACEMENT OF TISSUE EXPANDER ON THE RIGHT;  Surgeon: Irene Limbo, MD;  Location: [facility];  Service: Plastics;  Laterality: Right;  RFNA  . MASTECTOMY Right 10/28/2016  . RECONSTRUCTION BREAST W/ LATISSIMUS DORSI FLAP Right 10/28/2016    WITH PLACEMENT OF TISSUE EXPANDER   . REMOVAL OF BILATERAL TISSUE EXPANDERS WITH PLACEMENT OF BILATERAL BREAST IMPLANTS Right 03/07/2017   Procedure: REMOVAL OF RIGHT BREAST TISSUE EXPANDER WITH PLACEMENT OF RIGHT BREAST IMPLANT;  Surgeon: Irene Limbo, MD;  Location: [facility];  Service: Plastics;  Laterality:  Right;  Marland Kitchen TISSUE EXPANDER PLACEMENT Right 10/28/2016   Procedure: TISSUE EXPANDER;  Surgeon: Irene Limbo, MD;  Location: [facility];  Service: Plastics;  Laterality: Right;  . TOTAL HIP ARTHROPLASTY Right 05/12/2015   Procedure: RIGHT TOTAL HIP ARTHROPLASTY ANTERIOR APPROACH;  Surgeon: Paralee Cancel, MD;  Location: [facility];  Service: Orthopedics;  Laterality: Right;  . TOTAL HIP ARTHROPLASTY Left 06/23/2015   Procedure: LEFT TOTAL HIP ARTHROPLASTY ANTERIOR APPROACH;  Surgeon: Paralee Cancel, MD;  Location: [facility];  Service: Orthopedics;  Laterality: Left;  . TOTAL MASTECTOMY Right 10/28/2016   Procedure:  RIGHT MASTECTOMY;  Surgeon: Alphonsa Overall, MD;  Location: [facility];  Service: General;  Laterality: Right;    Family History  Problem Relation Age of Onset  . Hypertension Mother   . Diabetes Mother   . Breast cancer Mother 51  . Hypertension Sister   . Hypertension Brother        X 3  . Cancer Maternal Aunt        leukemia  . Breast cancer Paternal Aunt        diagnosed in 72s or 52s  . Cancer Paternal Uncle        unknown type  . Cancer Paternal Aunt        unknown type  . Breast cancer Cousin 58  . Stroke Neg Hx   . Heart disease Neg Hx     Social History   Tobacco Use  . Smoking status: Never Smoker  . Smokeless tobacco: Never Used  Substance Use Topics  . Alcohol use: No  . Drug use: No    Prior to Admission medications   Medication Sig Start Date End Date Taking? Authorizing Provider  amLODipine (NORVASC) 5 MG tablet TAKE 1 TABLET BY MOUTH EVERY DAY Patient taking differently: Take 5 mg by mouth daily.  09/17/18  Yes Shelda Pal, DO  amphetamine-dextroamphetamine (ADDERALL) 20 MG tablet Take 20 mg by mouth daily. 06/21/18  Yes [provider]  carvedilol (COREG) 25 MG tablet TAKE 1 TABLET (25 MG TOTAL) BY MOUTH 2 (TWO) TIMES DAILY WITH A MEAL. 02/27/19  Yes Shelda Pal, DO  DULoxetine (CYMBALTA) 30 MG capsule TAKE 1 CAPSULE BY MOUTH EVERY DAY Patient taking differently: Take 30 mg by mouth daily. TAKE 1 CAPSULE BY MOUTH EVERY DAY 01/03/19  Yes Shelda Pal, DO  letrozole [facility]) 2.5 MG tablet Take 1 tablet (2.5 mg total) by mouth daily. 02/04/19  Yes Ladell Pier, MD  losartan-hydrochlorothiazide (HYZAAR) 100-25 MG tablet TAKE 1 TABLET BY MOUTH EVERY DAY Patient taking differently: Take 1 tablet by mouth daily.  12/24/18  Yes Shelda Pal, DO  pantoprazole (PROTONIX) 40 MG tablet TAKE 1 TABLET BY MOUTH EVERY DAY IN THE EVENING Patient taking differently: Take 40 mg by mouth daily.  01/02/19  Yes Wendling, Crosby Oyster, DO  Semaglutide, 1 MG/DOSE, (OZEMPIC, 1 MG/DOSE,) 2 MG/1.5ML SOPN Inject 1 mg into the skin once a week. 09/24/18  Yes Wendling, Crosby Oyster, DO  glucose blood (ONETOUCH VERIO) test strip Use once daily to check blood sugar.  DX E11.9 03/07/18   Shelda Pal, DO  meloxicam (MOBIC) 15 MG tablet TAKE 1 TABLET BY MOUTH EVERY DAY Patient not taking: Reported on 03/09/2019 11/05/18   Shelda Pal, DO  [facility] DELICA LANCETS FINE MISC Use as directed once daily to check blood sugar.  DXE11.9 04/27/18   Shelda Pal, DO    Allergies Prednisone   REVIEW OF  SYSTEMS  Negative except as noted here or in the History of Present Illness.   PHYSICAL EXAMINATION  Initial Vital Signs Blood pressure 124/71, pulse 75, temperature 98.1 F (36.7 C), temperature source Oral, resp. rate 16, height 5\' 5"  (1.651 m), weight 97.5 kg, SpO2 98 %.  Examination General: Well-developed, well-nourished female in no acute distress; appearance consistent with age of record HENT: normocephalic; atraumatic Eyes: pupils equal, round and reactive to light; extraocular muscles intact Neck: supple Heart: regular rate and rhythm Lungs: clear to auscultation bilaterally Abdomen: soft; nondistended; nontender; no masses or hepatosplenomegaly; bowel sounds present Extremities: Mid left lower leg deformity and tenderness, left foot distally neurovascularly intact with intact tendon function in the toes Neurologic: Awake, alert and oriented; motor function intact in all extremities and symmetric; no facial droop Skin: Warm and dry Psychiatric: Normal mood and affect   RESULTS  Summary of this visit's results, reviewed by myself:   EKG Interpretation  Date/Time:  Saturday March 09 2019 06:27:09 EDT Ventricular Rate:  73 PR Interval:    QRS Duration: 100 QT Interval:  402 QTC Calculation: 443 R Axis:   -4 Text Interpretation:  Sinus rhythm Low voltage, precordial leads No  significant change was found Confirmed by Senita Corredor, Jenny Reichmann [phone]) on 03/09/2019 6:39:52 AM      Laboratory Studies: Results for orders placed or performed during the hospital encounter of 03/09/19 (from the past 24 hour(s))  CBC with Differential/Platelet     Status: None   Collection Time: 03/09/19  6:34 AM  Result Value Ref Range   WBC 9.6 4.0 - 10.5 K/uL   RBC 4.22 3.87 - 5.11 MIL/uL   Hemoglobin 12.1 12.0 - 15.0 g/dL   HCT 38.1 36.0 - 46.0 %   MCV 90.3 80.0 - 100.0 fL   MCH 28.7 26.0 - 34.0 pg   MCHC 31.8 30.0 - 36.0 g/dL   RDW 13.5 11.5 - 15.5 %   Platelets 226 150 - 400 K/uL   nRBC 0.0 0.0 - 0.2 %   Neutrophils Relative % 68 %   Neutro Abs 6.4 1.7 - 7.7 K/uL   Lymphocytes Relative 23 %   Lymphs Abs 2.2 0.7 - 4.0 K/uL   Monocytes Relative 8 %   Monocytes Absolute 0.8 0.1 - 1.0 K/uL   Eosinophils Relative 1 %   Eosinophils Absolute 0.1 0.0 - 0.5 K/uL   Basophils Relative 0 %   Basophils Absolute 0.0 0.0 - 0.1 K/uL   Immature Granulocytes 0 %   Abs Immature Granulocytes 0.03 0.00 - 0.07 K/uL  Basic metabolic panel     Status: Abnormal   Collection Time: 03/09/19  6:34 AM  Result Value Ref Range   Sodium 138 135 - 145 mmol/L   Potassium 2.8 (L) 3.5 - 5.1 mmol/L   Chloride 96 (L) 98 - 111 mmol/L   CO2 32 22 - 32 mmol/L   Glucose, Bld 106 (H) 70 - 99 mg/dL   BUN 19 6 - 20 mg/dL   Creatinine, Ser 1.20 (H) 0.44 - 1.00 mg/dL   Calcium 10.2 8.9 - 10.3 mg/dL   GFR calc non Af Amer 49 (L) >60 mL/min   GFR calc Af Amer 57 (L) >60 mL/min   Anion gap 10 5 - 15  Magnesium     Status: None   Collection Time: 03/09/19  6:34 AM  Result Value Ref Range   Magnesium 2.2 1.7 - 2.4 mg/dL  SARS Coronavirus 2 [facility] order, Performed in [facility]  hospital lab) Nasopharyngeal Nasopharyngeal Swab     Status: None   Collection Time: 03/09/19  7:11 AM   Specimen: Nasopharyngeal Swab  Result Value Ref Range   SARS Coronavirus 2 NEGATIVE NEGATIVE  CBG monitoring, ED     Status: None    Collection Time: 03/09/19  9:52 AM  Result Value Ref Range   Glucose-Capillary 87 70 - 99 mg/dL  Troponin I (High Sensitivity)     Status: None   Collection Time: 03/09/19  9:57 AM  Result Value Ref Range   Troponin I (High Sensitivity) 5 <18 ng/L  Basic metabolic panel     Status: Abnormal   Collection Time: 03/09/19  8:50 PM  Result Value Ref Range   Sodium 138 135 - 145 mmol/L   Potassium 3.7 3.5 - 5.1 mmol/L   Chloride 101 98 - 111 mmol/L   CO2 24 22 - 32 mmol/L   Glucose, Bld 107 (H) 70 - 99 mg/dL   BUN 16 6 - 20 mg/dL   Creatinine, Ser 1.04 (H) 0.44 - 1.00 mg/dL   Calcium 9.5 8.9 - 10.3 mg/dL   GFR calc non Af Amer 59 (L) >60 mL/min   GFR calc Af Amer >60 >60 mL/min   Anion gap 13 5 - 15   Imaging Studies: Dg Chest 1 View  Result Date: 03/09/2019 CLINICAL DATA:  Patient status post fall. EXAM: CHEST  1 VIEW COMPARISON:  Chest radiograph 02/19/2018 FINDINGS: Monitoring leads overlie the patient. Stable cardiac and mediastinal contours. Stable cardiac and mediastinal contours. Elevation right hemidiaphragm. Bibasilar heterogeneous opacities. No pleural effusion or pneumothorax. Postsurgical changes right axilla. IMPRESSION: Low lung volumes with bibasilar atelectasis. Electronically Signed   By: Lovey Newcomer M.D.   On: 03/09/2019 06:11   Dg Tibia/fibula Left  Result Date: 03/09/2019 CLINICAL DATA:  Patient status post fall. EXAM: LEFT TIBIA AND FIBULA - 2 VIEW COMPARISON:  None. FINDINGS: There is a comminuted angulated displaced fracture of the distal tibial diaphysis with overlying soft tissue swelling. There is a comminuted displaced fracture of the proximal fibular diaphysis with overlying soft tissue swelling. IMPRESSION: Comminuted displaced oblique fracture through the distal tibial diaphysis. Comminuted displaced fracture through the proximal fibular diaphysis. Electronically Signed   By: Lovey Newcomer M.D.   On: 03/09/2019 06:13    ED COURSE and MDM  Nursing notes and initial  vitals signs, including pulse oximetry, reviewed.  Vitals:   03/09/19 1715 03/09/19 1730 03/09/19 1755 03/09/19 1955  BP: (!) 144/95 (!) 148/81 (!) 138/97 131/78  Pulse: 100 100 (!) 104 88  Resp: 11 20 18 14   Temp:  (!) 97.5 F (36.4 C) (!) 97.4 F (36.3 C) 99.3 F (37.4 C)  TempSrc:   Oral Oral  SpO2: 100% 98% 97% 97%  Weight:      Height:       6:36 AM Discussed with Dr. Ninfa Linden of orthopedics.  He requests the patient be placed in a posterior long-leg splint.  We will have hospitalist admit the patient and he will consult.   PROCEDURES    ED DIAGNOSES     ICD-10-CM   1. Syncope and collapse  R55   2. Closed displaced comminuted fracture of shaft of left tibia, initial encounter  S82.252A   3. Closed displaced comminuted fracture of shaft of left fibula, initial encounter  S82.452A       Tal Kempker, Jenny Reichmann, MD 03/09/19 2247

## 2019-03-09 NOTE — Discharge Instructions (Signed)
tibia  Intramedullary Nailing of Tibial Diaphyseal Fracture, Care After This sheet gives you information about how to care for yourself after your procedure. Your health care provider may also give you more specific instructions. If you have problems or questions, contact your health care provider. What can I expect after the procedure? After the procedure, it is common to have:  Pain and swelling in your lower leg. Follow these instructions at home: Medicines  Take over-the-counter and prescription medicines only as told by your health care provider.  If you were prescribed an antibiotic medicine, take it as told by your health care provider. Do not stop taking the antibiotic even if you start to feel better.  Do not drive or use heavy machinery while taking prescription medicine or until your health care provider approves. If you have a splint:  Wear the splint as told by your health care provider. Remove it only as told by your health care provider.  Loosen the splint if your toes tingle, become numb, or turn cold and blue.  Keep the splint clean.  If the splint is not waterproof: ? Do not let it get wet. ? Cover it with a watertight covering when you take a shower. Bathing  Do not take baths, swim, or use a hot tub until your health care provider approves. You may only be allowed to take sponge baths for bathing for the first few days.  Ask your health care provider if you can take showers.  Keep your bandage (dressing) dry until your health care provider says it can be removed. Incision care   Follow instructions from your health care provider about how to take care of your incision. Make sure you: ? Wash your hands with soap and water before you change your bandage (dressing). If soap and water are not available, use hand sanitizer. ? Change your dressing as told by your health care provider. ? Leave stitches (sutures), skin glue, or adhesive strips in place. These skin  closures may need to stay in place for 2 weeks or longer. If adhesive strip edges start to loosen and curl up, you may trim the loose edges. Do not remove adhesive strips completely unless your health care provider tells you to do that.  Check your incision area every day for signs of infection. Check for: ? More redness, swelling, or pain. ? More fluid or blood. ? Warmth. ? Pus or a bad smell. Managing pain, stiffness, and swelling   Raise (elevate) the injured area above the level of your heart while you are sitting or lying down.  Move your toes often to avoid stiffness and to lessen swelling.  If directed, put ice on the injured area. ? Put ice in a plastic bag. ? Place a towel between your skin and the bag. ? Leave the ice on for 20 minutes, 2-3 times a day. Activity  Do not use the injured limb to support your body weight. You may be able to put some weight on your leg (partial weight-bearing). Use crutches or a walker. Follow directions as told by your healthcare provider.  Avoid sitting or lying for a long time without moving. You will be encouraged to walk with assistance as soon as you can. This will help prevent blood clots.  If physical therapy was prescribed, do exercises as directed. It is important to do physical therapy to regain muscle strength and range of motion in your leg.  Rest and avoid activities that require a lot of  effort or put you at risk for a fall or another leg injury. Ask your health care provider what activities are safe for you and when you can begin leg motion after surgery. General instructions  To prevent or treat constipation while you are taking prescription pain medicine, your health care provider may recommend that you: ? Drink enough fluid to keep your urine clear or pale yellow. ? Take over-the-counter or prescription medicines. ? Eat foods that are high in fiber, such as fresh fruits and vegetables, whole grains, and beans. ? Limit foods  that are high in fat and processed sugars, such as fried and sweet foods.  Do not use any products that contain nicotine or tobacco, such as cigarettes and e-cigarettes. These can delay bone healing. If you need help quitting, ask your health care provider.  Take steps to prevent falls at home, such as removing throw rugs and tripping hazards.  Keep all follow-up visits as told by your health care provider. This is important. Contact a health care provider if:  You have more redness, swelling, or pain around your incision.  You have more fluid or blood coming from your incision.  Your incision feels warm to the touch.  You have pus or a bad smell coming from your incision.  You have a fever. Get help right away if:  Your incision breaks open.  You have red, painful, or swollen areas in one or both legs.  You have severe pain that does not get better with medicine. Summary  After your procedure, it is common to have pain and swelling in your lower leg.  Check your incision area every day for signs of infection, such as more redness or swelling.  Do not use the injured limb to support your body weight. You may be able to put some weight on your leg (partial weight-bearing). Use crutches or a walker. Follow directions as told by your healthcare provider.  Ask your health care provider what activities are safe for you while you recover. It is important to do physical therapy as instructed to regain strength and range of motion in your leg.  Keep all follow-up visits as told by your health care provider. This is important. This information is not intended to replace advice given to you by your health care provider. Make sure you discuss any questions you have with your health care provider. Document Released: 07/30/2013 Document Revised: 07/30/2018 Document Reviewed: 09/28/2016 Elsevier Patient Education  2020 [employer].

## 2019-03-10 ENCOUNTER — Encounter (HOSPITAL_COMMUNITY): Payer: Self-pay | Admitting: *Deleted

## 2019-03-10 DIAGNOSIS — S82892D Other fracture of left lower leg, subsequent encounter for closed fracture with routine healing: Secondary | ICD-10-CM

## 2019-03-10 LAB — CBC
HCT: 32.6 % — ABNORMAL LOW (ref 36.0–46.0)
Hemoglobin: 10.8 g/dL — ABNORMAL LOW (ref 12.0–15.0)
MCH: 29.8 pg (ref 26.0–34.0)
MCHC: 33.1 g/dL (ref 30.0–36.0)
MCV: 89.8 fL (ref 80.0–100.0)
Platelets: 174 10*3/uL (ref 150–400)
RBC: 3.63 MIL/uL — ABNORMAL LOW (ref 3.87–5.11)
RDW: 13.6 % (ref 11.5–15.5)
WBC: 6.7 10*3/uL (ref 4.0–10.5)
nRBC: 0 % (ref 0.0–0.2)

## 2019-03-10 LAB — BASIC METABOLIC PANEL
Anion gap: 9 (ref 5–15)
BUN: 13 mg/dL (ref 6–20)
CO2: 26 mmol/L (ref 22–32)
Calcium: 9.1 mg/dL (ref 8.9–10.3)
Chloride: 103 mmol/L (ref 98–111)
Creatinine, Ser: 1.12 mg/dL — ABNORMAL HIGH (ref 0.44–1.00)
GFR calc Af Amer: >60 mL/min (ref >60)
GFR calc non Af Amer: 54 mL/min — ABNORMAL LOW (ref >60)
Glucose, Bld: 132 mg/dL — ABNORMAL HIGH (ref 70–99)
Potassium: 3.5 mmol/L (ref 3.5–5.1)
Sodium: 138 mmol/L (ref 135–145)

## 2019-03-10 MED ORDER — ENOXAPARIN SODIUM 40 MG/0.4ML ~~LOC~~ SOLN
40.0000 mg | SUBCUTANEOUS | Status: DC
  Administered 2019-03-10 – 2019-03-12 (×3): 40 mg via SUBCUTANEOUS
  Filled 2019-03-10 (×3): qty 0.4

## 2019-03-10 MED ORDER — OXYCODONE HCL 5 MG PO TABS: 5.0000 mg | ORAL_TABLET | ORAL | 0 refills | Status: AC | PRN

## 2019-03-10 MED ORDER — ENOXAPARIN SODIUM 40 MG/0.4ML ~~LOC~~ SOLN: 40.0000 mg | SUBCUTANEOUS | 0 refills | Status: DC

## 2019-03-10 NOTE — Progress Notes (Signed)
Madeline Chandler is a 58 y.o. female   Orthopaedic diagnosis: Left comminuted distal tibia fracture status post IM nail  Subjective: Patient is complaining of some discomfort in her left leg.  Overall is doing well.  Denies any shortness of breath.  Denies any left lower extremity numbness or tingling.  No fevers or chills.  She has not worked with physical therapy yet.  She is asking to be discharged home.  Objectyive: Vitals:   03/09/19 1955 03/10/19 0434  BP: 131/78 (!) 113/59  Pulse: 88 89  Resp: 14 14  Temp: 99.3 F (37.4 C) 98.3 F (36.8 C)  SpO2: 97% 96%     Exam: Awake and alert Respirations even and unlabored No acute distress  Left lower extremity in Ace wrap.  Some swelling to the foot.  She is able to dorsiflex and plantarflex the ankle actively albeit with some discomfort.  Sensation intact on the dorsal and plantar foot in the superficial and deep peroneal nerve distributions as well as tibial nerve distribution.  Palpable dorsalis pedis pulse.  Assessment: Postop day 1 status post intramedullary nailing of left tibia fracture   Plan: She is doing well.  She will work with physical therapy for discharge planning purposes.  She is touchdown weightbearing on the left lower extremity. Okay for discharge from orthopedic standpoint once cleared from therapy in the medical team. I have sent pain medication to her pharmacy as well as Lovenox for DVT prophylaxis. She will follow-up with me in 2 weeks for wound check, x-rays and staple removal.   Radene Journey, MD

## 2019-03-10 NOTE — Evaluation (Signed)
Physical Therapy Evaluation Patient Details Name: Madeline Chandler MRN: [id number] DOB: Nov 21, 1960 Today's Date: 03/10/2019   History of Present Illness  58 y.o. female with past medical history significant for arthritis, breast cancer status post chemo and radiation, diabetes, GERD, hypertension, ACDF, back surgery, bil THR patient presents with near syncope, syncope.  Patient states that she was lying in bed and started having abdominal pain, started having diarrhea.  She went to the bathroom, she was feeling hot, sweaty, she sat in the toilet, put her elbow in the sink, head down and fell off and  hit her left leg on a cabinet. Xray  distal tibial fx; proximal fibular fx; 03/09/19 to OR IM nail Lt tibia  Clinical Impression   Patient is s/p above surgery resulting in functional limitations due to the deficits listed below (see PT Problem List). Patient in significant pain after sitting on BSC with LLE dependent trying to urinate (still unable). Putting a bit too much weight on LLE with sit to stand, however then able to maintain TDWB while wearing camwalker to ambulate 5 feet. She overall requires min assist for all mobility. Anticipate may need a wheelchair to mobilize throughout her apartment (she reports is w/c accessible).  Patient will benefit from skilled PT to increase their independence and safety with mobility to allow discharge to the venue listed below.       Follow Up Recommendations Home health PT    Equipment Recommendations  Wheelchair (measurements PT) ?24"wide   Recommendations for Other Services OT consult     Precautions / Restrictions Precautions Precautions: Fall Precaution Comments: pt reports she has had these spells before; everytime she goes outside and it's hot she feels like she will pass out;  Required Braces or Orthoses: Other Brace Other Brace: TDWB in cam walker Restrictions Weight Bearing Restrictions: Yes LLE Weight Bearing: Touchdown weight bearing(in cam  boot)      Mobility  Bed Mobility Overal bed mobility: Needs Assistance Bed Mobility: Sit to Supine       Sit to supine: Min assist   General bed mobility comments: return to bed, she could not lift her leg onto the bed  Transfers Overall transfer level: Needs assistance Equipment used: Rolling walker (2 wheeled) Transfers: Sit to/from Stand Sit to Stand: Min assist         General transfer comment: vc for hand placement and extending LLE in front of her to limit wt-bearing; assist to boost hips off BSC  Ambulation/Gait Ambulation/Gait assistance: Min assist Gait Distance (Feet): 5 Feet Assistive device: Rolling walker (2 wheeled) Gait Pattern/deviations: Step-to pattern;Decreased step length - right     General Gait Details: in camwalker; able to maintain TDWB, however fatigued and in pain (?exacerbated by prolonged time on BSC trying to urinate with leg hanging down)  Stairs            Wheelchair Mobility    Modified Rankin (Stroke Patients Only)       Balance Overall balance assessment: Mild deficits observed, not formally tested                                           Pertinent Vitals/Pain Pain Assessment: 0-10 Pain Score: 8  Pain Location: left ankle Pain Descriptors / Indicators: Constant;Grimacing;Pounding;Pressure Pain Intervention(s): Limited activity within patient's tolerance;Monitored during session;Repositioned;Patient requesting pain meds-RN notified    Home Living Family/patient expects  to be discharged to:: Private residence Living Arrangements: Alone Available Help at Discharge: Family;Available 24 hours/day Type of Home: Apartment Home Access: Stairs to enter Entrance Stairs-Rails: None Entrance Stairs-Number of Steps: 1 Home Layout: One level Home Equipment: Walker - 2 wheels;Bedside commode      Prior Function Level of Independence: Independent               Hand Dominance         Extremity/Trunk Assessment   Upper Extremity Assessment Upper Extremity Assessment: Overall WFL for tasks assessed    Lower Extremity Assessment Lower Extremity Assessment: LLE deficits/detail LLE Deficits / Details: edematous and with compression wrap, tight fit to put on her camwalker (in addition she cannot yet SLR)    Cervical / Trunk Assessment Cervical / Trunk Assessment: Other exceptions Cervical / Trunk Exceptions: obes  Communication   Communication: No difficulties  Cognition Arousal/Alertness: Awake/alert Behavior During Therapy: WFL for tasks assessed/performed Overall Cognitive Status: Within Functional Limits for tasks assessed                                        General Comments General comments (skin integrity, edema, etc.): educated on importance of elevation and taught how to best position in bed and using her bed remote    Exercises Other Exercises Other Exercises: instructed to work on LLE SLR   Assessment/Plan    PT Assessment Patient needs continued PT services  PT Problem List Decreased strength;Decreased activity tolerance;Decreased balance;Decreased mobility;Decreased knowledge of use of DME;Decreased knowledge of precautions;Obesity;Pain       PT Treatment Interventions DME instruction;Gait training;Stair training;Functional mobility training;Therapeutic activities;Therapeutic exercise;Patient/family education    PT Goals (Current goals can be found in the Care Plan section)  Acute Rehab PT Goals Patient Stated Goal: to return home when it is safe for her to do so PT Goal Formulation: With patient Time For Goal Achievement: 03/17/19 Potential to Achieve Goals: Good    Frequency Min 5X/week   Barriers to discharge        Co-evaluation               AM-PAC PT "6 Clicks" Mobility  Outcome Measure Help needed turning from your back to your side while in a flat bed without using bedrails?: A Lot Help needed moving  from lying on your back to sitting on the side of a flat bed without using bedrails?: A Little Help needed moving to and from a bed to a chair (including a wheelchair)?: A Little Help needed standing up from a chair using your arms (e.g., wheelchair or bedside chair)?: A Little Help needed to walk in hospital room?: A Lot Help needed climbing 3-5 steps with a railing? : Total 6 Click Score: 14    End of Session Equipment Utilized During Treatment: Other (comment)(camwalker) Activity Tolerance: Patient limited by pain Patient left: in bed;with call bell/phone within reach;with nursing/sitter in room Nurse Communication: Mobility status;Patient requests pain meds PT Visit Diagnosis: Unsteadiness on feet (R26.81);History of falling (Z91.81);Pain Pain - Right/Left: Left Pain - part of body: Ankle and joints of foot    Time: 1610-9604 PT Time Calculation (min) (ACUTE ONLY): 40 min   Charges:   PT Evaluation $PT Eval Low Complexity: 1 Low PT Treatments $Gait Training: 8-22 mins $Therapeutic Activity: 8-22 mins          Barry Brunner, PT  Jeanie Cooks Sani Loiseau 03/10/2019, 3:53 PM

## 2019-03-10 NOTE — Progress Notes (Signed)
PROGRESS NOTE    Madeline Chandler  WUJ:[id number] DOB: [birth year] DOA: 03/09/2019 PCP: Shelda Pal, DO   Brief Narrative:  Per HPI: Madeline Chandler is a 58 y.o. female with past medical history significant for arthritis, breast cancer status post chemo and radiation, diabetes, GERD, hypertension patient presents with near syncope, syncope.  Patient states that she was lying in bed and started having abdominal pain, started having diarrhea.  She went to the bathroom, she was feeling hot, sweaty, she sat in the toilet, put her elbow in the sink, head down and fell off and  hit her left leg on a cabinet.  She thinks that she might have passed out.  She complaining of 8 out of 10 pain. She denies chest pain, shortness of breath, prior to the episode she only felt lightheaded.  She denies chest pain or shortness of breath on ambulation when she goes to the shopping mall or when she walks at home. She has had prior episode of diarrhea in the past. Currently she denies abdominal pain, no fever diarrhea.  She seen that she ate some chicken wings that upset her stomach.  Patient was admitted for left comminuted distal tibia and proximal fibula fractures s/p fall related to vasovagal syncope. She has undergone intramedullary nail of her left tibia fracture on 8/1 and continues to have significant pain today along with difficulty urinating. PT has assessed her and recommended another assessment in am to better assess her condition once pain is controlled.  Assessment & Plan:   Principal Problem:   Ankle fracture Active Problems:   Hypertension   Diabetes mellitus type 2 in obese (HCC)   Near syncope   Syncope   Hypokalemia   Syncope-vasovagal -resolved; negative workup from cardiac standpoint  Hypokalemia -Resolved -Recheck in am  Left comminuted distal tibia and proximal fibula fracture s/p IM nail POD 1 -PT recommending ongoing assessment in am prior to DC -Still requiring IV  pain control today -Stable for DC from ortho standpoint  HTN -Stable -Continue to hold home medications aside from Coreg  Diarrhea-spontaneously resolved   DVT prophylaxis:Lovenox Code Status: Full Family Communication: None at bedside Disposition Plan: Anticipate DC in am after further PT evaluation and pain reassessment.   Consultants:   Orthopedics  Procedures:   IM nail to L ankle 8/1  Antimicrobials:  Anti-infectives (From admission, onward)   Start     Dose/Rate Route Frequency Ordered Stop   03/09/19 2200  ceFAZolin (ANCEF) injection 2 g  Status:  Discontinued     2 g Intramuscular Every 8 hours 03/09/19 1812 03/09/19 1823   03/09/19 2200  ceFAZolin (ANCEF) IVPB 2g/100 mL premix     2 g 200 mL/hr over 30 Minutes Intravenous Every 8 hours 03/09/19 1825 03/10/19 1346   03/09/19 1400  ceFAZolin (ANCEF) IVPB 2g/100 mL premix     2 g 200 mL/hr over 30 Minutes Intravenous On call to O.R. 03/09/19 1343 03/09/19 1440   03/09/19 1345  ceFAZolin (ANCEF) 2-4 GM/100ML-% IVPB    Note to Pharmacy: Henrine Screws   : cabinet override      03/09/19 1345 03/10/19 0159        Subjective: Patient seen and evaluated today with no new acute complaints or concerns. No acute concerns or events noted overnight. She continues to have some significant pain today and difficulty with urination. She could not participate much with PT as a result.  Objective: Vitals:   03/09/19 1955 03/10/19 0434 03/10/19  0835 03/10/19 1541  BP: 131/78 (!) 113/59 (!) 121/48 113/62  Pulse: 88 89 85 80  Resp: 14 14 18 16   Temp: 99.3 F (37.4 C) 98.3 F (36.8 C) 98.7 F (37.1 C) 98.3 F (36.8 C)  TempSrc: Oral Oral Oral Oral  SpO2: 97% 96% 100% 99%  Weight:      Height:        Intake/Output Summary (Last 24 hours) at 03/10/2019 1627 Last data filed at 03/10/2019 0948 Gross per 24 hour  Intake 640 ml  Output 1175 ml  Net -535 ml   Filed Weights   03/09/19 0447  Weight: 97.5 kg     Examination:  General exam: Appears calm and comfortable  Respiratory system: Clear to auscultation. Respiratory effort normal. Cardiovascular system: S1 & S2 heard, RRR. No JVD, murmurs, rubs, gallops or clicks. No pedal edema. Gastrointestinal system: Abdomen is nondistended, soft and nontender. No organomegaly or masses felt. Normal bowel sounds heard. Central nervous system: Alert and oriented. No focal neurological deficits. Extremities: Symmetric 5 x 5 power. L ankle cast present. Skin: No rashes, lesions or ulcers Psychiatry: Judgement and insight appear normal. Mood & affect appropriate.     Data Reviewed: I have personally reviewed following labs and imaging studies  CBC: Recent Labs  Lab 03/09/19 0634 03/10/19 0455  WBC 9.6 6.7  NEUTROABS 6.4  --   HGB 12.1 10.8*  HCT 38.1 32.6*  MCV 90.3 89.8  PLT 226 734   Basic Metabolic Panel: Recent Labs  Lab 03/09/19 0634 03/09/19 2050 03/10/19 0455  NA 138 138 138  K 2.8* 3.7 3.5  CL 96* 101 103  CO2 32 24 26  GLUCOSE 106* 107* 132*  BUN 19 16 13   CREATININE 1.20* 1.04* 1.12*  CALCIUM 10.2 9.5 9.1  MG 2.2  --   --    GFR: Estimated Creatinine Clearance: 62.5 mL/min (A) (by C-G formula based on SCr of 1.12 mg/dL (H)). Liver Function Tests: No results for input(s): AST, ALT, ALKPHOS, BILITOT, PROT, ALBUMIN in the last 168 hours. No results for input(s): LIPASE, AMYLASE in the last 168 hours. No results for input(s): AMMONIA in the last 168 hours. Coagulation Profile: No results for input(s): INR, PROTIME in the last 168 hours. Cardiac Enzymes: No results for input(s): CKTOTAL, CKMB, CKMBINDEX, TROPONINI in the last 168 hours. BNP (last 3 results) No results for input(s): PROBNP in the last 8760 hours. HbA1C: No results for input(s): HGBA1C in the last 72 hours. CBG: Recent Labs  Lab 03/09/19 0952  GLUCAP 87   Lipid Profile: No results for input(s): CHOL, HDL, LDLCALC, TRIG, CHOLHDL, LDLDIRECT in the  last 72 hours. Thyroid Function Tests: No results for input(s): TSH, T4TOTAL, FREET4, T3FREE, THYROIDAB in the last 72 hours. Anemia Panel: No results for input(s): VITAMINB12, FOLATE, FERRITIN, TIBC, IRON, RETICCTPCT in the last 72 hours. Sepsis Labs: No results for input(s): PROCALCITON, LATICACIDVEN in the last 168 hours.  Recent Results (from the past 240 hour(s))  SARS Coronavirus 2 [facility] order, Performed in [facility] hospital lab) Nasopharyngeal Nasopharyngeal Swab     Status: None   Collection Time: 03/09/19  7:11 AM   Specimen: Nasopharyngeal Swab  Result Value Ref Range Status   SARS Coronavirus 2 NEGATIVE NEGATIVE Final    Comment: (NOTE) If result is NEGATIVE SARS-CoV-2 target nucleic acids are NOT DETECTED. The SARS-CoV-2 RNA is generally detectable in upper and lower  respiratory specimens during the acute phase of infection. The lowest  concentration of  SARS-CoV-2 viral copies this assay can detect is 250  copies / mL. A negative result does not preclude SARS-CoV-2 infection  and should not be used as the sole basis for treatment or other  patient management decisions.  A negative result may occur with  improper specimen collection / handling, submission of specimen other  than nasopharyngeal swab, presence of viral mutation(s) within the  areas targeted by this assay, and inadequate number of viral copies  (<250 copies / mL). A negative result must be combined with clinical  observations, patient history, and epidemiological information. If result is POSITIVE SARS-CoV-2 target nucleic acids are DETECTED. The SARS-CoV-2 RNA is generally detectable in upper and lower  respiratory specimens dur ing the acute phase of infection.  Positive  results are indicative of active infection with SARS-CoV-2.  Clinical  correlation with patient history and other diagnostic information is  necessary to determine patient infection status.  Positive results do  not rule out  bacterial infection or co-infection with other viruses. If result is PRESUMPTIVE POSTIVE SARS-CoV-2 nucleic acids MAY BE PRESENT.   A presumptive positive result was obtained on the submitted specimen  and confirmed on repeat testing.  While 2019 novel coronavirus  (SARS-CoV-2) nucleic acids may be present in the submitted sample  additional confirmatory testing may be necessary for epidemiological  and / or clinical management purposes  to differentiate between  SARS-CoV-2 and other Sarbecovirus currently known to infect humans.  If clinically indicated additional testing with an alternate test  methodology [phone]) is advised. The SARS-CoV-2 RNA is generally  detectable in upper and lower respiratory sp ecimens during the acute  phase of infection. The expected result is Negative. Fact Sheet for Patients:  StrictlyIdeas.no Fact Sheet for Healthcare Providers: BankingDealers.co.za This test is not yet approved or cleared by the [place] FDA and has been authorized for detection and/or diagnosis of SARS-CoV-2 by FDA under an Emergency Use Authorization (EUA).  This EUA will remain in effect (meaning this test can be used) for the duration of the COVID-19 declaration under Section 564(b)(1) of the Act, 21 U.S.C. section 360bbb-3(b)(1), unless the authorization is terminated or revoked sooner. Performed at [facility], [place] [address], [place], [place] [address]          Radiology Studies: Dg Chest 1 View  Result Date: 03/09/2019 CLINICAL DATA:  Patient status post fall. EXAM: CHEST  1 VIEW COMPARISON:  Chest radiograph 02/19/2018 FINDINGS: Monitoring leads overlie the patient. Stable cardiac and mediastinal contours. Stable cardiac and mediastinal contours. Elevation right hemidiaphragm. Bibasilar heterogeneous opacities. No pleural effusion or pneumothorax. Postsurgical changes right axilla. IMPRESSION: Low  lung volumes with bibasilar atelectasis. Electronically Signed   By: Lovey Newcomer M.D.   On: 03/09/2019 06:11   Dg Tibia/fibula Left  Result Date: 03/09/2019 CLINICAL DATA:  Open reduction internal fixation for fracture EXAM: LEFT TIBIA AND FIBULA - 2 VIEW; DG C-ARM 61-120 MIN COMPARISON:  March 09, 2019 preoperative study FINDINGS: Frontal and lateral views obtained. There is screw and rod fixation through a comminuted fracture of the distal tibial diaphysis with alignment near anatomic at the fracture site. There is a comminuted fracture of the proximal fibular diaphysis with alignment near anatomic. No new fractures. No dislocation. Joint spaces appear unremarkable. IMPRESSION: Right fixation through distal tibial fracture with alignment near anatomic at the fracture site. Stable comminuted fracture proximal fibular diaphysis. No dislocation. No new fracture. No evident arthropathy. Electronically Signed   By: Lowella Grip III  M.D.   On: 03/09/2019 16:27   Dg Tibia/fibula Left  Result Date: 03/09/2019 CLINICAL DATA:  Patient status post fall. EXAM: LEFT TIBIA AND FIBULA - 2 VIEW COMPARISON:  None. FINDINGS: There is a comminuted angulated displaced fracture of the distal tibial diaphysis with overlying soft tissue swelling. There is a comminuted displaced fracture of the proximal fibular diaphysis with overlying soft tissue swelling. IMPRESSION: Comminuted displaced oblique fracture through the distal tibial diaphysis. Comminuted displaced fracture through the proximal fibular diaphysis. Electronically Signed   By: Lovey Newcomer M.D.   On: 03/09/2019 06:13   Dg C-arm 1-60 Min  Result Date: 03/09/2019 CLINICAL DATA:  Open reduction internal fixation for fracture EXAM: LEFT TIBIA AND FIBULA - 2 VIEW; DG C-ARM 61-120 MIN COMPARISON:  March 09, 2019 preoperative study FINDINGS: Frontal and lateral views obtained. There is screw and rod fixation through a comminuted fracture of the distal tibial diaphysis  with alignment near anatomic at the fracture site. There is a comminuted fracture of the proximal fibular diaphysis with alignment near anatomic. No new fractures. No dislocation. Joint spaces appear unremarkable. IMPRESSION: Right fixation through distal tibial fracture with alignment near anatomic at the fracture site. Stable comminuted fracture proximal fibular diaphysis. No dislocation. No new fracture. No evident arthropathy. Electronically Signed   By: Lowella Grip III M.D.   On: 03/09/2019 16:27        Scheduled Meds:  amphetamine-dextroamphetamine  20 mg Oral Daily   carvedilol  3.125 mg Oral BID WC   DULoxetine  30 mg Oral Daily   enoxaparin (LOVENOX) injection  40 mg Subcutaneous Q24H   letrozole  2.5 mg Oral Daily   pantoprazole  40 mg Oral Daily   senna  1 tablet Oral BID   Continuous Infusions:  dextrose 5 % and 0.9% NaCl 100 mL/hr at 03/10/19 0522     LOS: 1 day    Time spent: 30 minutes    Satia Winger Darleen Crocker, DO [facility] Pager [phone]  If 7PM-7AM, please contact night-coverage [email or web address] Password [facility] 03/10/2019, 4:27 PM

## 2019-03-10 NOTE — Progress Notes (Signed)
Physical Therapy Wheelchair note  Patient suffers from left tib-fib fracture which impairs their ability to perform daily activities like walking in the home.  A walker alone will not resolve the issues with performing activities of daily living. A wheelchair will allow patient to safely perform daily activities.  The patient can self propel in the home or has a caregiver who can provide assistance.       Barry Brunner, PT

## 2019-03-10 NOTE — Progress Notes (Signed)
Notified [facility] @0222  that pt has not voided since surgery. Requested for an in and out cath. Bladder scan results were >99mL. Order received. In and out cath resulted with 1162mL clear yellow urine.

## 2019-03-11 LAB — BASIC METABOLIC PANEL
Anion gap: 8 (ref 5–15)
BUN: 6 mg/dL (ref 6–20)
CO2: 28 mmol/L (ref 22–32)
Calcium: 8.9 mg/dL (ref 8.9–10.3)
Chloride: 100 mmol/L (ref 98–111)
Creatinine, Ser: 0.81 mg/dL (ref 0.44–1.00)
GFR calc Af Amer: >60 mL/min (ref >60)
GFR calc non Af Amer: >60 mL/min (ref >60)
Glucose, Bld: 101 mg/dL — ABNORMAL HIGH (ref 70–99)
Potassium: 3 mmol/L — ABNORMAL LOW (ref 3.5–5.1)
Sodium: 136 mmol/L (ref 135–145)

## 2019-03-11 LAB — CBC
HCT: 28.9 % — ABNORMAL LOW (ref 36.0–46.0)
Hemoglobin: 9.5 g/dL — ABNORMAL LOW (ref 12.0–15.0)
MCH: 29.1 pg (ref 26.0–34.0)
MCHC: 32.9 g/dL (ref 30.0–36.0)
MCV: 88.7 fL (ref 80.0–100.0)
Platelets: 169 10*3/uL (ref 150–400)
RBC: 3.26 MIL/uL — ABNORMAL LOW (ref 3.87–5.11)
RDW: 13.5 % (ref 11.5–15.5)
WBC: 7.3 10*3/uL (ref 4.0–10.5)
nRBC: 0 % (ref 0.0–0.2)

## 2019-03-11 MED ORDER — POTASSIUM CHLORIDE CRYS ER 20 MEQ PO TBCR
40.0000 meq | EXTENDED_RELEASE_TABLET | Freq: Two times a day (BID) | ORAL | Status: AC
  Administered 2019-03-11 (×2): 40 meq via ORAL
  Filled 2019-03-11 (×2): qty 2

## 2019-03-11 MED ORDER — BUPIVACAINE-EPINEPHRINE (PF) 0.5% -1:200000 IJ SOLN
INTRAMUSCULAR | Status: DC | PRN
  Administered 2019-03-09: 30 mL via PERINEURAL

## 2019-03-11 MED ORDER — LIDOCAINE-EPINEPHRINE (PF) 1.5 %-1:200000 IJ SOLN
INTRAMUSCULAR | Status: DC | PRN
  Administered 2019-03-09: 30 mL via PERINEURAL

## 2019-03-11 NOTE — Anesthesia Procedure Notes (Signed)
Anesthesia Regional Block: Popliteal block   Pre-Anesthetic Checklist: ,, timeout performed, Correct Patient, Correct Site, Correct Laterality, Correct Procedure, Correct Position, site marked, Risks and benefits discussed,  Surgical consent,  Pre-op evaluation,  At surgeon's request and post-op pain management  Laterality: Left  Prep: chloraprep       Needles:  Injection technique: Single-shot  Needle Type: Echogenic Stimulator Needle     Needle Length: 10cm  Needle Gauge: 21     Additional Needles:   Procedures:, nerve stimulator,,,,,,,   Nerve Stimulator or Paresthesia:  Response: 0.4 mA,   Additional Responses:   Narrative:  Start time: 03/09/2019 2:20 PM End time: 03/09/2019 2:30 PM Injection made incrementally with aspirations every 5 mL.  Performed by: Personally  Anesthesiologist: Lillia Abed, MD  Additional Notes: Monitors applied. Patient sedated. Sterile prep and drape,hand hygiene and sterile gloves were used. Relevant anatomy identified.Needle position confirmed.Local anesthetic injected incrementally after negative aspiration. Local anesthetic spread visualized around nerve(s). Vascular puncture avoided. No complications. Image printed for medical record.The patient tolerated the procedure well.  Additional Saphenous nerve block performed. 15cc Local Anesthetic mixture placed under ultrasonic guidance along the medio-inferior border of the Sartorious muscle 6 inches above the knee.  No Problems encountered.  Lillia Abed MD

## 2019-03-11 NOTE — Plan of Care (Signed)
  Problem: Education: Goal: Knowledge of General Education information will improve Description Including pain rating scale, medication(s)/side effects and non-pharmacologic comfort measures Outcome: Progressing   

## 2019-03-11 NOTE — Discharge Summary (Addendum)
Physician Discharge Summary  Madeline Chandler NGE:[id number] DOB: 1961-05-06 DOA: 03/09/2019  PCP: Shelda Pal, DO  Admit date: 03/09/2019  Discharge date: 03/12/2019  Admitted From:Home  Disposition:  Home  Recommendations for Outpatient Follow-up:  1. Follow up with PCP in 1-2 weeks 2. Follow up with Dr. Lucia Gaskins in 2 weeks and schedule appointment 3. Take pain medication as need for pain management as prescribed 4. Continue on Lovenox for DVT prophylaxis as prescribed  Home Health:Yes with PT  Equipment/Devices:Wheelchair for elevated leg rests  Discharge Condition:Stable  CODE STATUS: Full  Diet recommendation: Heart Healthy/Carb modified  Brief/Interim Summary: Per HPI: Madeline R Maddenis a 58 y.o.femalewith past medical history significant for arthritis, breast cancer status post chemo and radiation, diabetes, GERD, hypertension patient presents with near syncope, syncope. Patient states that she was lying in bed and started having abdominal pain, started having diarrhea.She went to the bathroom,she was feeling hot, sweaty,she sat in the toilet,put her elbow in thesink,head down and fell off andhit her left leg on a cabinet. She thinks that she might have passed out.She complaining of 8 out of 10 pain. She denies chest pain, shortness of breath, prior to the episode she only felt lightheaded. She denies chest pain or shortness of breath on ambulation when she goes to the shopping mall or when she walks at home. She has had prior episode of diarrhea in the past. Currently she denies abdominal pain, no fever diarrhea. She seen that she ate some chicken wings that upset her stomach.  Patient was admitted for left comminuted distal tibia and proximal fibula fractures s/p fall related to vasovagal syncope. She has undergone intramedullary nail of her left tibia fracture on 8/1 and was noted to have significant, uncontrolled pain on 8/2 which did not allow for  adequate PT assessment. She has had ongoing trouble with urinary retention over the last 24 hours necessitating frequent catheterizations that almost led to Foley placement on 8/3, but she began to urinate and has not had any trouble this morning on 8/4.  Her blood pressures have also remained soft, but she is asymptomatic,but her blood pressures are beginning to elevate. Will place her back on her usual home medications and f/u with PCP to reevaluate.  Discharge Diagnoses:  Principal Problem:   Ankle fracture Active Problems:   Hypertension   Diabetes mellitus type 2 in obese [facility])   Near syncope   Syncope   Hypokalemia    Discharge Instructions  Discharge Instructions    Diet - low sodium heart healthy   Complete by: As directed    Increase activity slowly   Complete by: As directed      Allergies as of 03/12/2019      Reactions   Prednisone       Medication List    STOP taking these medications   meloxicam 15 MG tablet Commonly known as: MOBIC     TAKE these medications   amLODipine 5 MG tablet Commonly known as: NORVASC TAKE 1 TABLET BY MOUTH EVERY DAY   amphetamine-dextroamphetamine 20 MG tablet Commonly known as: ADDERALL Take 20 mg by mouth daily.   carvedilol 25 MG tablet Commonly known as: COREG TAKE 1 TABLET (25 MG TOTAL) BY MOUTH 2 (TWO) TIMES DAILY WITH A MEAL.   DULoxetine 30 MG capsule Commonly known as: CYMBALTA TAKE 1 CAPSULE BY MOUTH EVERY DAY What changed:   how much to take  how to take this  when to take this   enoxaparin  40 MG/0.4ML injection Commonly known as: LOVENOX Inject 0.4 mLs (40 mg total) into the skin daily for 14 days.   glucose blood test strip Commonly known as: OneTouch Verio Use once daily to check blood sugar.  DX E11.9   letrozole 2.5 MG tablet Commonly known as: FEMARA Take 1 tablet (2.5 mg total) by mouth daily.   losartan-hydrochlorothiazide 100-25 MG tablet Commonly known as: HYZAAR TAKE 1 TABLET BY  MOUTH EVERY DAY   OneTouch Delica Lancets Fine Misc Use as directed once daily to check blood sugar.  DXE11.9   oxyCODONE 5 MG immediate release tablet Commonly known as: Roxicodone Take 1-2 tablets (5-10 mg total) by mouth every 4 (four) hours as needed for up to 7 days.   pantoprazole 40 MG tablet Commonly known as: PROTONIX TAKE 1 TABLET BY MOUTH EVERY DAY IN THE EVENING What changed:   how much to take  how to take this  when to take this   Semaglutide (1 MG/DOSE) 2 MG/1.5ML Sopn Commonly known as: Ozempic (1 MG/DOSE) Inject 1 mg into the skin once a week.            Durable Medical Equipment  (From admission, onward)         Start     Ordered   03/11/19 1744  For home use only DME standard manual wheelchair with seat cushion  Once    Comments: Patient suffers from ankle fracture which impairs their ability to perform daily activities like ADLs in the home.  A walking aid will not resolve issue with performing activities of daily living. A wheelchair will allow patient to safely perform daily activities. Patient can safely propel the wheelchair in the home or has a caregiver who can provide assistance. Length of need 3 months. Accessories: elevating leg rests (ELRs)   03/11/19 1744         Follow-up Information    Erle Crocker, MD. Schedule an appointment as soon as possible for a visit in 2 weeks.   Specialty: Orthopedic Surgery Contact information: [place] [address] [phone]        Shelda Pal, DO Follow up in 2 week(s).   Specialty: Family Medicine Contact information: [place] [address] [phone]          Allergies  Allergen Reactions  . Prednisone     Consultations:  Orthopedics   Procedures/Studies: Dg Chest 1 View  Result Date: 03/09/2019 CLINICAL DATA:  Patient status post fall. EXAM: CHEST  1 VIEW COMPARISON:  Chest radiograph 02/19/2018 FINDINGS:  Monitoring leads overlie the patient. Stable cardiac and mediastinal contours. Stable cardiac and mediastinal contours. Elevation right hemidiaphragm. Bibasilar heterogeneous opacities. No pleural effusion or pneumothorax. Postsurgical changes right axilla. IMPRESSION: Low lung volumes with bibasilar atelectasis. Electronically Signed   By: Lovey Newcomer M.D.   On: 03/09/2019 06:11   Dg Tibia/fibula Left  Result Date: 03/09/2019 CLINICAL DATA:  Open reduction internal fixation for fracture EXAM: LEFT TIBIA AND FIBULA - 2 VIEW; DG C-ARM 61-120 MIN COMPARISON:  March 09, 2019 preoperative study FINDINGS: Frontal and lateral views obtained. There is screw and rod fixation through a comminuted fracture of the distal tibial diaphysis with alignment near anatomic at the fracture site. There is a comminuted fracture of the proximal fibular diaphysis with alignment near anatomic. No new fractures. No dislocation. Joint spaces appear unremarkable. IMPRESSION: Right fixation through distal tibial fracture with alignment near anatomic at the fracture  site. Stable comminuted fracture proximal fibular diaphysis. No dislocation. No new fracture. No evident arthropathy. Electronically Signed   By: Lowella Grip III M.D.   On: 03/09/2019 16:27   Dg Tibia/fibula Left  Result Date: 03/09/2019 CLINICAL DATA:  Patient status post fall. EXAM: LEFT TIBIA AND FIBULA - 2 VIEW COMPARISON:  None. FINDINGS: There is a comminuted angulated displaced fracture of the distal tibial diaphysis with overlying soft tissue swelling. There is a comminuted displaced fracture of the proximal fibular diaphysis with overlying soft tissue swelling. IMPRESSION: Comminuted displaced oblique fracture through the distal tibial diaphysis. Comminuted displaced fracture through the proximal fibular diaphysis. Electronically Signed   By: Lovey Newcomer M.D.   On: 03/09/2019 06:13   Dg C-arm 1-60 Min  Result Date: 03/09/2019 CLINICAL DATA:  Open reduction  internal fixation for fracture EXAM: LEFT TIBIA AND FIBULA - 2 VIEW; DG C-ARM 61-120 MIN COMPARISON:  March 09, 2019 preoperative study FINDINGS: Frontal and lateral views obtained. There is screw and rod fixation through a comminuted fracture of the distal tibial diaphysis with alignment near anatomic at the fracture site. There is a comminuted fracture of the proximal fibular diaphysis with alignment near anatomic. No new fractures. No dislocation. Joint spaces appear unremarkable. IMPRESSION: Right fixation through distal tibial fracture with alignment near anatomic at the fracture site. Stable comminuted fracture proximal fibular diaphysis. No dislocation. No new fracture. No evident arthropathy. Electronically Signed   By: Lowella Grip III M.D.   On: 03/09/2019 16:27    Discharge Exam: Vitals:   03/12/19 0510 03/12/19 0854  BP: (!) 147/79 127/62  Pulse: 98 90  Resp: 16 16  Temp: 98 F (36.7 C) 98.4 F (36.9 C)  SpO2: 99% 99%   Vitals:   03/11/19 1546 03/11/19 1949 03/12/19 0510 03/12/19 0854  BP: (!) 93/55 131/73 (!) 147/79 127/62  Pulse: 85 91 98 90  Resp: 16 16 16 16   Temp: 98.4 F (36.9 C) 98.7 F (37.1 C) 98 F (36.7 C) 98.4 F (36.9 C)  TempSrc: Oral Oral Oral Oral  SpO2: 99% 98% 99% 99%  Weight:      Height:        General: Pt is alert, awake, not in acute distress Cardiovascular: RRR, S1/S2 +, no rubs, no gallops Respiratory: CTA bilaterally, no wheezing, no rhonchi Abdominal: Soft, NT, ND, bowel sounds + Extremities: no edema, no cyanosis, LLE in cast Foley with clear, yellow, UO    The results of significant diagnostics from this hospitalization (including imaging, microbiology, ancillary and laboratory) are listed below for reference.     Microbiology: Recent Results (from the past 240 hour(s))  SARS Coronavirus 2 [facility] order, Performed in [facility] hospital lab) Nasopharyngeal Nasopharyngeal Swab     Status: None   Collection Time: 03/09/19   7:11 AM   Specimen: Nasopharyngeal Swab  Result Value Ref Range Status   SARS Coronavirus 2 NEGATIVE NEGATIVE Final    Comment: (NOTE) If result is NEGATIVE SARS-CoV-2 target nucleic acids are NOT DETECTED. The SARS-CoV-2 RNA is generally detectable in upper and lower  respiratory specimens during the acute phase of infection. The lowest  concentration of SARS-CoV-2 viral copies this assay can detect is 250  copies / mL. A negative result does not preclude SARS-CoV-2 infection  and should not be used as the sole basis for treatment or other  patient management decisions.  A negative result may occur with  improper specimen collection / handling, submission of specimen other  than  nasopharyngeal swab, presence of viral mutation(s) within the  areas targeted by this assay, and inadequate number of viral copies  (<250 copies / mL). A negative result must be combined with clinical  observations, patient history, and epidemiological information. If result is POSITIVE SARS-CoV-2 target nucleic acids are DETECTED. The SARS-CoV-2 RNA is generally detectable in upper and lower  respiratory specimens dur ing the acute phase of infection.  Positive  results are indicative of active infection with SARS-CoV-2.  Clinical  correlation with patient history and other diagnostic information is  necessary to determine patient infection status.  Positive results do  not rule out bacterial infection or co-infection with other viruses. If result is PRESUMPTIVE POSTIVE SARS-CoV-2 nucleic acids MAY BE PRESENT.   A presumptive positive result was obtained on the submitted specimen  and confirmed on repeat testing.  While 2019 novel coronavirus  (SARS-CoV-2) nucleic acids may be present in the submitted sample  additional confirmatory testing may be necessary for epidemiological  and / or clinical management purposes  to differentiate between  SARS-CoV-2 and other Sarbecovirus currently known to infect  humans.  If clinically indicated additional testing with an alternate test  methodology [phone]) is advised. The SARS-CoV-2 RNA is generally  detectable in upper and lower respiratory sp ecimens during the acute  phase of infection. The expected result is Negative. Fact Sheet for Patients:  StrictlyIdeas.no Fact Sheet for Healthcare Providers: BankingDealers.co.za This test is not yet approved or cleared by the [place] FDA and has been authorized for detection and/or diagnosis of SARS-CoV-2 by FDA under an Emergency Use Authorization (EUA).  This EUA will remain in effect (meaning this test can be used) for the duration of the COVID-19 declaration under Section 564(b)(1) of the Act, 21 U.S.C. section 360bbb-3(b)(1), unless the authorization is terminated or revoked sooner. Performed at [facility], [place] [address], [place], [place] [address]      Labs: BNP (last 3 results) No results for input(s): BNP in the last 8760 hours. Basic Metabolic Panel: Recent Labs  Lab 03/09/19 0634 03/09/19 2050 03/10/19 0455 03/11/19 0505  NA 138 138 138 136  K 2.8* 3.7 3.5 3.0*  CL 96* 101 103 100  CO2 32 24 26 28   GLUCOSE 106* 107* 132* 101*  BUN 19 16 13 6   CREATININE 1.20* 1.04* 1.12* 0.81  CALCIUM 10.2 9.5 9.1 8.9  MG 2.2  --   --   --    Liver Function Tests: No results for input(s): AST, ALT, ALKPHOS, BILITOT, PROT, ALBUMIN in the last 168 hours. No results for input(s): LIPASE, AMYLASE in the last 168 hours. No results for input(s): AMMONIA in the last 168 hours. CBC: Recent Labs  Lab 03/09/19 0634 03/10/19 0455 03/11/19 0505  WBC 9.6 6.7 7.3  NEUTROABS 6.4  --   --   HGB 12.1 10.8* 9.5*  HCT 38.1 32.6* 28.9*  MCV 90.3 89.8 88.7  PLT 226 174 169   Cardiac Enzymes: No results for input(s): CKTOTAL, CKMB, CKMBINDEX, TROPONINI in the last 168 hours. BNP: Invalid input(s): POCBNP CBG: Recent Labs   Lab 03/09/19 0952  GLUCAP 87   D-Dimer No results for input(s): DDIMER in the last 72 hours. Hgb A1c No results for input(s): HGBA1C in the last 72 hours. Lipid Profile No results for input(s): CHOL, HDL, LDLCALC, TRIG, CHOLHDL, LDLDIRECT in the last 72 hours. Thyroid function studies No results for input(s): TSH, T4TOTAL, T3FREE, THYROIDAB in the last 72 hours.  Invalid input(s):  FREET3 Anemia work up No results for input(s): VITAMINB12, FOLATE, FERRITIN, TIBC, IRON, RETICCTPCT in the last 72 hours. Urinalysis    Component Value Date/Time   COLORURINE YELLOW 06/16/2015 [place] 06/16/2015 1157   LABSPEC 1.031 (H) 06/16/2015 1157   PHURINE 6.0 06/16/2015 1157   GLUCOSEU NEGATIVE 06/16/2015 1157   HGBUR NEGATIVE 06/16/2015 1157   BILIRUBINUR NEGATIVE 06/16/2015 1157   BILIRUBINUR neg 04/07/2011 1210   KETONESUR NEGATIVE 06/16/2015 1157   PROTEINUR NEGATIVE 06/16/2015 1157   UROBILINOGEN 0.2 06/16/2015 1157   NITRITE NEGATIVE 06/16/2015 1157   LEUKOCYTESUR NEGATIVE 06/16/2015 1157   Sepsis Labs Invalid input(s): PROCALCITONIN,  WBC,  LACTICIDVEN Microbiology Recent Results (from the past 240 hour(s))  SARS Coronavirus 2 [facility] order, Performed in [facility] hospital lab) Nasopharyngeal Nasopharyngeal Swab     Status: None   Collection Time: 03/09/19  7:11 AM   Specimen: Nasopharyngeal Swab  Result Value Ref Range Status   SARS Coronavirus 2 NEGATIVE NEGATIVE Final    Comment: (NOTE) If result is NEGATIVE SARS-CoV-2 target nucleic acids are NOT DETECTED. The SARS-CoV-2 RNA is generally detectable in upper and lower  respiratory specimens during the acute phase of infection. The lowest  concentration of SARS-CoV-2 viral copies this assay can detect is 250  copies / mL. A negative result does not preclude SARS-CoV-2 infection  and should not be used as the sole basis for treatment or other  patient management decisions.  A negative result may occur  with  improper specimen collection / handling, submission of specimen other  than nasopharyngeal swab, presence of viral mutation(s) within the  areas targeted by this assay, and inadequate number of viral copies  (<250 copies / mL). A negative result must be combined with clinical  observations, patient history, and epidemiological information. If result is POSITIVE SARS-CoV-2 target nucleic acids are DETECTED. The SARS-CoV-2 RNA is generally detectable in upper and lower  respiratory specimens dur ing the acute phase of infection.  Positive  results are indicative of active infection with SARS-CoV-2.  Clinical  correlation with patient history and other diagnostic information is  necessary to determine patient infection status.  Positive results do  not rule out bacterial infection or co-infection with other viruses. If result is PRESUMPTIVE POSTIVE SARS-CoV-2 nucleic acids MAY BE PRESENT.   A presumptive positive result was obtained on the submitted specimen  and confirmed on repeat testing.  While 2019 novel coronavirus  (SARS-CoV-2) nucleic acids may be present in the submitted sample  additional confirmatory testing may be necessary for epidemiological  and / or clinical management purposes  to differentiate between  SARS-CoV-2 and other Sarbecovirus currently known to infect humans.  If clinically indicated additional testing with an alternate test  methodology [phone]) is advised. The SARS-CoV-2 RNA is generally  detectable in upper and lower respiratory sp ecimens during the acute  phase of infection. The expected result is Negative. Fact Sheet for Patients:  StrictlyIdeas.no Fact Sheet for Healthcare Providers: BankingDealers.co.za This test is not yet approved or cleared by the [place] FDA and has been authorized for detection and/or diagnosis of SARS-CoV-2 by FDA under an Emergency Use Authorization (EUA).  This EUA  will remain in effect (meaning this test can be used) for the duration of the COVID-19 declaration under Section 564(b)(1) of the Act, 21 U.S.C. section 360bbb-3(b)(1), unless the authorization is terminated or revoked sooner. Performed at [facility], [place] [address], [place], [place] [address]  Time coordinating discharge: 35 minutes  SIGNED:   Rodena Goldmann, DO [facility] 03/12/2019, 8:54 AM  If 7PM-7AM, please contact night-coverage [email or web address] Password [email or web address]

## 2019-03-11 NOTE — Progress Notes (Signed)
Pt has not voided since 1900. Placed pt on bedside commode to see if she can go and couldn't. Bladder scanned pt resulted with 438mL. Notified [facility] for order for in and out cath. Order received. In and out cath resulted with 594mL. This is the third time pt has had to have an in and out cath since surgery on 03/09/19.

## 2019-03-11 NOTE — Addendum Note (Signed)
Addendum  created 03/11/19 1747 by Lillia Abed, MD   Child order released for a procedure order, Clinical Note Signed, Intraprocedure Blocks edited, Intraprocedure Meds edited

## 2019-03-11 NOTE — Progress Notes (Signed)
Physical Therapy Treatment Patient Details Name: Madeline Chandler MRN: [id number] DOB: August 31, 1960 Today's Date: 03/11/2019    History of Present Illness 58 y.o. female with past medical history significant for arthritis, breast cancer status post chemo and radiation, diabetes, GERD, hypertension, ACDF, back surgery, bil THR patient presents with near syncope, syncope.  Patient states that she was lying in bed and started having abdominal pain, started having diarrhea.  She went to the bathroom, she was feeling hot, sweaty, she sat in the toilet, put her elbow in the sink, head down and fell off and  hit her left leg on a cabinet. Xray  distal tibial fx; proximal fibular fx; 03/09/19 to OR IM nail Lt tibia    PT Comments    Pt seated on commode on arrival without CAM walker donned.  Pt educated on the importance of donning CAM walker when she is up for transfer and gt activities.  Pt performed gt training progression and transfer training.  She required min guard to stand from Bedside commode.  She is progressing well and remains appropriate to return home.  Will continue to recommend HHPT at d/c.  OT consult added as she has a tub shower in her APT at home.     Follow Up Recommendations  Home health PT     Equipment Recommendations  Wheelchair (measurements PT);Wheelchair cushion (measurements PT)(WC 18x20 with elevating leg rests.)    Recommendations for Other Services OT consult     Precautions / Restrictions Precautions Precautions: Fall Precaution Comments: pt reports she has had these spells before; everytime she goes outside and it's hot she feels like she will pass out;  Required Braces or Orthoses: Other Brace Other Brace: TDWB in cam walker Restrictions Weight Bearing Restrictions: Yes LLE Weight Bearing: Touchdown weight bearing    Mobility  Bed Mobility Overal bed mobility: Needs Assistance Bed Mobility: Sit to Supine       Sit to supine: Min assist   General bed  mobility comments: Min assistance to lift LLE back into bed against gravity.  Transfers Overall transfer level: Needs assistance Equipment used: Rolling walker (2 wheeled) Transfers: Sit to/from Stand Sit to Stand: Min guard         General transfer comment: Cues for hand placement to push from [facility] to achieve standing.  Pt required reminders for weight bearing and hand placement.  Ambulation/Gait Ambulation/Gait assistance: Min guard Gait Distance (Feet): 40 Feet Assistive device: Rolling walker (2 wheeled) Gait Pattern/deviations: Step-to pattern;Decreased step length - right(hop to pattern.)     General Gait Details: Pt in CAM walker, remains able to maintain TDWB.  Pt required cues for sequencing and RW safety.  Pt required x3 standing rest breaks as she continues to fatigue.   Stairs             Wheelchair Mobility    Modified Rankin (Stroke Patients Only)       Balance Overall balance assessment: Mild deficits observed, not formally tested                                          Cognition Arousal/Alertness: Awake/alert Behavior During Therapy: WFL for tasks assessed/performed Overall Cognitive Status: Within Functional Limits for tasks assessed  Exercises      General Comments        Pertinent Vitals/Pain Pain Assessment: 0-10 Pain Location: left ankle Pain Descriptors / Indicators: Constant;Grimacing;Pounding;Pressure Pain Intervention(s): Monitored during session;Repositioned    Home Living                      Prior Function            PT Goals (current goals can now be found in the care plan section) Acute Rehab PT Goals Patient Stated Goal: to return home when it is safe for her to do so Potential to Achieve Goals: Good Progress towards PT goals: Progressing toward goals    Frequency    Min 5X/week      PT Plan Current plan remains appropriate     Co-evaluation              AM-PAC PT "6 Clicks" Mobility   Outcome Measure  Help needed turning from your back to your side while in a flat bed without using bedrails?: A Little Help needed moving from lying on your back to sitting on the side of a flat bed without using bedrails?: A Little Help needed moving to and from a bed to a chair (including a wheelchair)?: A Little Help needed standing up from a chair using your arms (e.g., wheelchair or bedside chair)?: A Little Help needed to walk in hospital room?: A Little Help needed climbing 3-5 steps with a railing? : A Lot 6 Click Score: 17    End of Session Equipment Utilized During Treatment: Other (comment)(CAM walker to be worn when up.) Activity Tolerance: Patient tolerated treatment well Patient left: in bed;with call bell/phone within reach Nurse Communication: Mobility status PT Visit Diagnosis: Unsteadiness on feet (R26.81);History of falling (Z91.81);Pain Pain - Right/Left: Left Pain - part of body: Ankle and joints of foot     Time: 3545-6256 PT Time Calculation (min) (ACUTE ONLY): 27 min  Charges:  $Gait Training: 8-22 mins $Therapeutic Activity: 8-22 mins                     Governor Rooks, PTA Acute Rehabilitation Services Pager [phone] Office [phone]     Cristela Blue 03/11/2019, 4:49 PM

## 2019-03-12 ENCOUNTER — Encounter (HOSPITAL_COMMUNITY): Payer: Self-pay | Admitting: Orthopaedic Surgery

## 2019-03-12 NOTE — Plan of Care (Signed)
  Problem: Elimination: Goal: Will not experience complications related to bowel motility Outcome: Progressing   Problem: Activity: Goal: Risk for activity intolerance will decrease Outcome: Progressing   Problem: Pain Managment: Goal: General experience of comfort will improve Outcome: Progressing   

## 2019-03-12 NOTE — Progress Notes (Signed)
Provided discharge education/instructions, all questions and concerns addressed. Pt has BSC and walker at home, wheelchair will be delivered to home per CSW. Pt not in distress, discharged home with belongings accompanied by family.

## 2019-03-12 NOTE — Plan of Care (Signed)
  Problem: Clinical Measurements: Goal: Ability to maintain clinical measurements within normal limits will improve Outcome: Progressing   Problem: Activity: Goal: Risk for activity intolerance will decrease Outcome: Progressing   Problem: Elimination: Goal: Will not experience complications related to bowel motility Outcome: Progressing   Problem: Pain Managment: Goal: General experience of comfort will improve Outcome: Progressing   Problem: Safety: Goal: Ability to remain free from injury will improve Outcome: Progressing   Problem: Skin Integrity: Goal: Risk for impaired skin integrity will decrease Outcome: Progressing   

## 2019-03-12 NOTE — TOC Transition Note (Addendum)
Transition of Care [facility]) - CM/SW Discharge Note   Patient Details  Name: Madeline Chandler MRN: [id number] Date of Birth: 1961-05-28  Transition of Care [facility]) CM/SW Contact:  Weston Anna, LCSW Phone Number: 03/12/2019, 11:16 AM   Clinical Narrative:     UPDATE: Patient has accepted [facility] has accepted referral at this time.   CSW aware patient ready for discharge today- DME to be delivered from [place] (wheelchair will be delivered to room before discharge).  CSW has reached out to [facility] agencies to see who will accept insurance. CSW will update patient once agency is confirmed.   Final next level of care: Home w Home Health Services Barriers to Discharge: No Barriers Identified   Patient Goals and CMS Choice   CMS Medicare.gov Compare Post Acute Care list provided to:: Patient Choice offered to / list presented to : Patient  Discharge Placement                       Discharge Plan and Services     Post Acute Care Choice: Durable Medical Equipment, Home Health          DME Arranged: 3-N-1, [place] [facility] DME Agency: [facility] Date DME Agency Contacted: 03/12/19 Time DME Agency Contacted: [phone] Representative spoke with at DME Agency: [place] ([place]) Interventions     Readmission Risk Interventions No flowsheet data found.

## 2019-03-12 NOTE — Progress Notes (Signed)
Patient seen and evaluated at bedside this am. She denies any new complaints or concerns and is urinating well and without difficulty. Her BP has elevated as well and she states her pain is well controlled. She should have a ride to pick her up around noon today.  Please see DC summary on 8/3 for full details.

## 2019-03-12 NOTE — Progress Notes (Signed)
Physical Therapy Treatment Patient Details Name: Madeline Chandler MRN: [id number] DOB: Jan 26, 1961 Today's Date: 03/12/2019    History of Present Illness 58 y.o. female with past medical history significant for arthritis, breast cancer status post chemo and radiation, diabetes, GERD, hypertension, ACDF, back surgery, bil THR patient presents with near syncope, syncope.  Patient states that she was lying in bed and started having abdominal pain, started having diarrhea.  She went to the bathroom, she was feeling hot, sweaty, she sat in the toilet, put her elbow in the sink, head down and fell off and  hit her left leg on a cabinet. Xray  distal tibial fx; proximal fibular fx; 03/09/19 to OR IM nail Lt tibia    PT Comments    Pt performed supine LE therapuetic exercises and performed increased distance during gt training.  She remains limited due to pain and fatigue.  Pt performing all mobility with supervision to min guard assistance.  She is to d/c home today and awaiting a WC for home use.  Plan remains appropriate for follow up HHPT.     Follow Up Recommendations  Home health PT     Equipment Recommendations  Wheelchair (measurements PT);Wheelchair cushion (measurements PT) 18x20 WC   Recommendations for Other Services       Precautions / Restrictions Precautions Precautions: Fall Precaution Comments: pt reports she has had these spells before; everytime she goes outside and it's hot she feels like she will pass out;  Other Brace: TDWB in cam walker Restrictions Weight Bearing Restrictions: Yes LLE Weight Bearing: Touchdown weight bearing(with CAM walker)    Mobility  Bed Mobility Overal bed mobility: Needs Assistance Bed Mobility: Supine to Sit     Supine to sit: Supervision;HOB elevated     General bed mobility comments: Pt able to advance to edge of bed unassisted with HOB elevated.  Transfers Overall transfer level: Needs assistance Equipment used: Rolling walker (2  wheeled) Transfers: Sit to/from Stand Sit to Stand: Supervision         General transfer comment: Cues for hand placement to push up from soft bed.  Increased time and effort noted but able to achieve standing.  Ambulation/Gait Ambulation/Gait assistance: Min guard Gait Distance (Feet): 45 Feet Assistive device: Rolling walker (2 wheeled) Gait Pattern/deviations: Step-to pattern;Decreased step length - right(hop to pattern)     General Gait Details: Pt in CAM walker boot, for the most part presents with NWB and rests in TDWB.  She continues to require frequent rest breaks due to fatigue.  Reports feeling mildly dizzy during last 15 ft of gt trial but no signs of syncope.   Stairs             Wheelchair Mobility    Modified Rankin (Stroke Patients Only)       Balance Overall balance assessment: Mild deficits observed, not formally tested                                          Cognition Arousal/Alertness: Awake/alert Behavior During Therapy: WFL for tasks assessed/performed Overall Cognitive Status: Within Functional Limits for tasks assessed                                        Exercises Total Joint Exercises Ankle Circles/Pumps: AROM;Right;10 reps;Supine Quad  Sets: AROM;Both;10 reps;Supine Heel Slides: AROM;Both;10 reps;Supine;AAROM Hip ABduction/ADduction: AROM;Both;10 reps;Supine;AAROM Straight Leg Raises: AROM;AAROM;Both;10 reps;Supine    General Comments        Pertinent Vitals/Pain Pain Assessment: 0-10 Pain Score: 5  Pain Location: left ankle Pain Descriptors / Indicators: Constant;Grimacing;Pressure Pain Intervention(s): Monitored during session;Repositioned    Home Living                      Prior Function            PT Goals (current goals can now be found in the care plan section) Acute Rehab PT Goals Patient Stated Goal: to return home when it is safe for her to do so     Frequency    Min 5X/week      PT Plan Current plan remains appropriate    Co-evaluation              AM-PAC PT "6 Clicks" Mobility   Outcome Measure  Help needed turning from your back to your side while in a flat bed without using bedrails?: A Little Help needed moving from lying on your back to sitting on the side of a flat bed without using bedrails?: A Little Help needed moving to and from a bed to a chair (including a wheelchair)?: A Little Help needed standing up from a chair using your arms (e.g., wheelchair or bedside chair)?: A Little Help needed to walk in hospital room?: A Little Help needed climbing 3-5 steps with a railing? : A Lot 6 Click Score: 17    End of Session   Activity Tolerance: Patient tolerated treatment well Patient left: in bed;with call bell/phone within reach Nurse Communication: Mobility status PT Visit Diagnosis: Unsteadiness on feet (R26.81);History of falling (Z91.81);Pain Pain - Right/Left: Left Pain - part of body: Ankle and joints of foot     Time: 9381-0175 PT Time Calculation (min) (ACUTE ONLY): 23 min  Charges:  $Gait Training: 8-22 mins $Therapeutic Exercise: 8-22 mins                     Governor Rooks, PTA Acute Rehabilitation Services Pager [phone] Office [phone]     Madeline Chandler 03/12/2019, 10:06 AM

## 2019-03-12 NOTE — Evaluation (Signed)
Occupational Therapy Evaluation Patient Details Name: Madeline Chandler MRN: [id number] DOB: 1960-11-18 Today's Date: 03/12/2019    History of Present Illness 58 y.o. female with past medical history significant for arthritis, breast cancer status post chemo and radiation, diabetes, GERD, hypertension, ACDF, back surgery, bil THR patient presents with near syncope, syncope.  Patient states that she was lying in bed and started having abdominal pain, started having diarrhea.  She went to the bathroom, she was feeling hot, sweaty, she sat in the toilet, put her elbow in the sink, head down and fell off and  hit her left leg on a cabinet. Xray  distal tibial fx; proximal fibular fx; 03/09/19 to OR IM nail Lt tibia   Clinical Impression   Pt is at set up level with UB ADLs and grooming, min A with toileting and mod A with LB ADLs, sup with transfers using RW. Pt educated on TWB status as pt believed she had NWB status of L LE. Pt's mother will be assisting her 24/7 at home and pt has all necessary DME and A/E for home use. All education completed and no further acute OT indicated at this time    Follow Up Recommendations  Home health OT    Equipment Recommendations  None recommended by OT    Recommendations for Other Services       Precautions / Restrictions Precautions Precautions: Fall Precaution Comments: pt reports she has had these spells before; everytime she goes outside and it's hot she feels like she will pass out;  Other Brace: TDWB in cam walker Restrictions Weight Bearing Restrictions: Yes LLE Weight Bearing: Touchdown weight bearing      Mobility Bed Mobility Overal bed mobility: Needs Assistance Bed Mobility: Supine to Sit     Supine to sit: Supervision;HOB elevated     General bed mobility comments: pt in recliner upon arrival  Transfers Overall transfer level: Needs assistance Equipment used: Rolling walker (2 wheeled) Transfers: Sit to/from Stand Sit to Stand:  Supervision         General transfer comment: Cues for hand placement.  Increased time and effort    Balance Overall balance assessment: Mild deficits observed, not formally tested                                         ADL either performed or assessed with clinical judgement   ADL Overall ADL's : Needs assistance/impaired Eating/Feeding: Independent;Sitting   Grooming: Wash/dry hands;Wash/dry face;Sitting;Set up   Upper Body Bathing: Set up;Sitting   Lower Body Bathing: Moderate assistance;With caregiver independent assisting   Upper Body Dressing : Set up;Sitting   Lower Body Dressing: Moderate assistance;With caregiver independent assisting   Toilet Transfer: Supervision/safety;Ambulation;RW;Comfort height toilet;Grab bars   Toileting- Clothing Manipulation and Hygiene: Minimal assistance;Sit to/from Nurse, children's Details (indicate cue type and reason): pt has tub shower at home and is not allowed to shower at this time, will defer this to [facility] Functional mobility during ADLs: Supervision/safety       Vision Baseline Vision/History: Wears glasses Wears Glasses: At all times Patient Visual Report: No change from baseline       Perception     Praxis      Pertinent Vitals/Pain Pain Assessment: 0-10 Pain Score: 5  Pain Location: left ankle Pain Descriptors / Indicators: Constant;Grimacing;Pressure Pain Intervention(s): Monitored during session;Repositioned  Hand Dominance Right   Extremity/Trunk Assessment Upper Extremity Assessment Upper Extremity Assessment: Overall WFL for tasks assessed   Lower Extremity Assessment Lower Extremity Assessment: Defer to PT evaluation       Communication Communication Communication: No difficulties   Cognition Arousal/Alertness: Awake/alert Behavior During Therapy: WFL for tasks assessed/performed Overall Cognitive Status: Within Functional Limits for tasks assessed                                      General Comments       Exercises Total Joint Exercises Ankle Circles/Pumps: AROM;Right;10 reps;Supine Quad Sets: AROM;Both;10 reps;Supine Heel Slides: AROM;Both;10 reps;Supine;AAROM Hip ABduction/ADduction: AROM;Both;10 reps;Supine;AAROM Straight Leg Raises: AROM;AAROM;Both;10 reps;Supine   Shoulder Instructions      Home Living Family/patient expects to be discharged to:: Private residence Living Arrangements: Alone Available Help at Discharge: Family;Available 24 hours/day Type of Home: Apartment Home Access: Stairs to enter Entrance Stairs-Number of Steps: 1 Entrance Stairs-Rails: None Home Layout: One level     Bathroom Shower/Tub: Tub/shower unit;Curtain         Home Equipment: Environmental consultant - 2 wheels;Bedside commode;Adaptive equipment Adaptive Equipment: Reacher        Prior Functioning/Environment Level of Independence: Independent                 OT Problem List: Decreased activity tolerance;Impaired balance (sitting and/or standing);Decreased coordination;Pain      OT Treatment/Interventions:      OT Goals(Current goals can be found in the care plan section) Acute Rehab OT Goals Patient Stated Goal: to return home when it is safe for her to do so OT Goal Formulation: With patient  OT Frequency:     Barriers to D/C:            Co-evaluation              AM-PAC OT "6 Clicks" Daily Activity     Outcome Measure Help from another person eating meals?: None Help from another person taking care of personal grooming?: A Little Help from another person toileting, which includes using toliet, bedpan, or urinal?: A Little Help from another person bathing (including washing, rinsing, drying)?: A Lot Help from another person to put on and taking off regular upper body clothing?: A Little Help from another person to put on and taking off regular lower body clothing?: A Lot 6 Click Score: 17   End of Session  Equipment Utilized During Treatment: Gait belt;Rolling walker  Activity Tolerance: Patient tolerated treatment well Patient left: in chair;with call bell/phone within reach  OT Visit Diagnosis: Unsteadiness on feet (R26.81);Other abnormalities of gait and mobility (R26.89);Muscle weakness (generalized) (M62.81);History of falling (Z91.81);Pain Pain - Right/Left: Left Pain - part of body: Ankle and joints of foot                Time: 1031-1049 OT Time Calculation (min): 18 min Charges:  OT General Charges $OT Visit: 1 Visit OT Evaluation $OT Eval Low Complexity: 1 Low   Britt Bottom 03/12/2019, 12:31 PM

## 2019-03-13 ENCOUNTER — Telehealth: Payer: Self-pay | Admitting: *Deleted

## 2019-03-13 ENCOUNTER — Telehealth: Payer: Self-pay | Admitting: Family Medicine

## 2019-03-13 NOTE — Telephone Encounter (Signed)
Called informed of PCP's instructions. The patient verbalized understanding.

## 2019-03-13 NOTE — Telephone Encounter (Signed)
Home Health Verbal Orders - Caller/Agency: [facility] Callback Number: [phone] QPEAKL TYVD  Requesting OT/PT/Skilled Nursing/Social Work/Speech Therapy: PT  Frequency: 1x for 1wk and 2x for 2wks functional mobility.
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
## 2019-03-13 NOTE — Telephone Encounter (Signed)
Can add: OK to take Tylenol 1000 mg (2 extra strength tabs) or 975 mg (3 regular strength tabs) every 6 hours as needed.  Ice. Elevation. Consider calling surgery team if pain that severe.  Chlorthalidone is not on her list. Hyzaar, Norvasc and Coreg are.   Would recommend fiber, push fluids, and consider mag citrate until she sees me.
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
## 2019-03-15 ENCOUNTER — Encounter: Payer: Self-pay | Admitting: Family Medicine

## 2019-03-15 ENCOUNTER — Other Ambulatory Visit: Payer: Self-pay

## 2019-03-15 ENCOUNTER — Ambulatory Visit (INDEPENDENT_AMBULATORY_CARE_PROVIDER_SITE_OTHER): Payer: 59 | Admitting: Family Medicine

## 2019-03-15 DIAGNOSIS — R55 Syncope and collapse: Secondary | ICD-10-CM

## 2019-03-15 DIAGNOSIS — S82252A Displaced comminuted fracture of shaft of left tibia, initial encounter for closed fracture: Secondary | ICD-10-CM | POA: Diagnosis not present

## 2019-03-15 DIAGNOSIS — E876 Hypokalemia: Secondary | ICD-10-CM

## 2019-03-15 NOTE — Progress Notes (Signed)
Chief Complaint  Patient presents with  . Hospitalization Follow-up    HPI Madeline Chandler is a 58 y.o. y.o. female who presents for a transition of care visit.  Pt was discharged from [facility] on 03/12/2019.  Within 48 business hours of discharge our office contacted pt via telephone to coordinate care and needs. Due to COVID-19 pandemic, we are interacting via web portal for an electronic face-to-face visit. I verified patient's ID using 2 identifiers. Patient agreed to proceed with visit via this method. Patient is at home, I am at office. Patient and I are present for visit.   6 days ago, the patient had a bout of diarrhea.  She was sitting on the toilet and started to feel hot and flushed.  She took off her clothing and leaned forward.  The next thing she knew, she was falling forward after having lost consciousness for a brief second.  She thought she hit her leg against a cabinet or maybe it twisted.  She had pain in her leg.  She went to the emergency department and found to have a fracture in her tibia and fibula.  She had surgery and was discharged several days later.  She had a bowel movement yesterday and her pain is getting better, however is still very bothersome.  She is walking in a boot but is trying to keep weight off of it.  She has a follow-up appointment with her orthopedic surgeon next week.  She has been working with physical therapy.  She is taking Lovenox twice daily.  Reports compliance.  No adverse effects.  Denies fevers, shortness of breath, chest pain, vision changes, sore throat, runny/stuffy nose, rashes, blood in the stool, urinary complaints.  She has never passed out before, but does have intermittent episodes of presyncope.  Unsure what causes this.  They did monitor her heart rhythm/rate in the hospital and nothing abnormal was noted.  She denies any palpitations, chest pain, or shortness of breath.    Past Medical History:  Diagnosis Date  . Anxiety   . Arthritis    "hips" (10/28/2016)  . Breast cancer, right breast ([place]) 1997; 2018   S/P chemo & radiation;   . Bulging lumbar disc    2  . Depression   . Diabetes mellitus without complication ([place])   . GERD (gastroesophageal reflux disease)   . History of colonic polyps    Dr Collene Mares  . Hypertension   . Migraine    "none in the last few years; had them mostly in late 20's/mid 30's" (10/28/2016)  . OSA (obstructive sleep apnea) 02/19/2018  . Pre-diabetes   . Restless leg syndrome    @SURG @ Family History  Problem Relation Age of Onset  . Hypertension Mother   . Diabetes Mother   . Breast cancer Mother 56  . Hypertension Sister   . Hypertension Brother        X 3  . Cancer Maternal Aunt        leukemia  . Breast cancer Paternal Aunt        diagnosed in 67s or 54s  . Cancer Paternal Uncle        unknown type  . Cancer Paternal Aunt        unknown type  . Breast cancer Cousin 27  . Stroke Neg Hx   . Heart disease Neg Hx    Allergies as of 03/15/2019      Reactions   Prednisone  Medication List       Accurate as of March 15, 2019 11:55 AM. If you have any questions, ask your nurse or doctor.        amLODipine 5 MG tablet Commonly known as: NORVASC TAKE 1 TABLET BY MOUTH EVERY DAY   amphetamine-dextroamphetamine 20 MG tablet Commonly known as: ADDERALL Take 20 mg by mouth daily.   carvedilol 25 MG tablet Commonly known as: COREG TAKE 1 TABLET (25 MG TOTAL) BY MOUTH 2 (TWO) TIMES DAILY WITH A MEAL.   DULoxetine 30 MG capsule Commonly known as: CYMBALTA TAKE 1 CAPSULE BY MOUTH EVERY DAY What changed:   how much to take  how to take this  when to take this   enoxaparin 40 MG/0.4ML injection Commonly known as: LOVENOX Inject 0.4 mLs (40 mg total) into the skin daily for 14 days.   glucose blood test strip Commonly known as: OneTouch Verio Use once daily to check blood sugar.  DX E11.9   letrozole 2.5 MG tablet Commonly known as: FEMARA Take 1 tablet (2.5 mg  total) by mouth daily.   losartan-hydrochlorothiazide 100-25 MG tablet Commonly known as: HYZAAR TAKE 1 TABLET BY MOUTH EVERY DAY   OneTouch Delica Lancets Fine Misc Use as directed once daily to check blood sugar.  DXE11.9   oxyCODONE 5 MG immediate release tablet Commonly known as: Roxicodone Take 1-2 tablets (5-10 mg total) by mouth every 4 (four) hours as needed for up to 7 days.   pantoprazole 40 MG tablet Commonly known as: PROTONIX TAKE 1 TABLET BY MOUTH EVERY DAY IN THE EVENING What changed:   how much to take  how to take this  when to take this   Semaglutide (1 MG/DOSE) 2 MG/1.5ML Sopn Commonly known as: Ozempic (1 MG/DOSE) Inject 1 mg into the skin once a week.       ROS:  Constitutional: No fevers  Skin: no rashes HENT: No headaches, hearing loss, or runny nose, no sore throat Eyes: No rash Heart: No chest pain Lungs: No SOB, no cough Abd: No bowel changes, no pain, no N/V GU: No urinary complaints Neuro: +numbness over surg site Msk: +LLE pain  Objective No conversational dyspnea Age appropriate judgment and insight Nml affect and mood  Syncope, unspecified syncope type - Plan: ECHOCARDIOGRAM COMPLETE  Closed displaced comminuted fracture of shaft of left tibia, initial encounter - Plan: cont pain management and f/u with ortho next week  Hypokalemia - Plan: BMP as soon as she is able to come in.   Discharge summary and medication list have been reviewed/reconciled.  Labs pending at the time of discharge have been reviewed or are still pending at the time of this visit.  Follow-up labs and appointments have been ordered and/or coordinated appropriately.  TRANSITIONAL CARE MANAGEMENT CERTIFICATION:  I certify the following are true:   1. Communication with the patient/care giver was made within 2 business days of discharge.  2. Complexity of Medical decision making is moderate.  3. Face to face visit occurred within 14 days of discharge.   F/u  for labs. The patient voiced understanding and agreement to the plan.  [place], DO 03/15/19 12:04 PM

## 2019-03-20 ENCOUNTER — Other Ambulatory Visit: Payer: 59

## 2019-03-21 ENCOUNTER — Other Ambulatory Visit: Payer: Self-pay | Admitting: Family Medicine

## 2019-03-22 ENCOUNTER — Other Ambulatory Visit (INDEPENDENT_AMBULATORY_CARE_PROVIDER_SITE_OTHER): Payer: 59

## 2019-03-22 ENCOUNTER — Other Ambulatory Visit: Payer: Self-pay

## 2019-03-22 ENCOUNTER — Other Ambulatory Visit: Payer: Self-pay | Admitting: Family Medicine

## 2019-03-22 NOTE — Addendum Note (Signed)
Addended by: Caffie Pinto on: 03/22/2019 03:53 PM   Modules accepted: Orders

## 2019-03-23 ENCOUNTER — Encounter: Payer: Self-pay | Admitting: Family Medicine

## 2019-03-23 LAB — BASIC METABOLIC PANEL
BUN: 14 mg/dL (ref 7–25)
CO2: 27 mmol/L (ref 20–32)
Calcium: 10.6 mg/dL — ABNORMAL HIGH (ref 8.6–10.4)
Chloride: 99 mmol/L (ref 98–110)
Creat: 1.05 mg/dL (ref 0.50–1.05)
Glucose, Bld: 101 mg/dL — ABNORMAL HIGH (ref 65–99)
Potassium: 4 mmol/L (ref 3.5–5.3)
Sodium: 137 mmol/L (ref 135–146)

## 2019-03-28 ENCOUNTER — Other Ambulatory Visit: Payer: Self-pay

## 2019-03-28 ENCOUNTER — Emergency Department (HOSPITAL_COMMUNITY)
Admission: EM | Admit: 2019-03-28 | Discharge: 2019-03-28 | Disposition: A | Discharge status: Home / Self Care | Payer: 59 | Attending: Emergency Medicine | Admitting: Emergency Medicine

## 2019-03-28 ENCOUNTER — Ambulatory Visit: Payer: Self-pay

## 2019-03-28 ENCOUNTER — Emergency Department (HOSPITAL_COMMUNITY): Payer: 59

## 2019-03-28 DIAGNOSIS — I951 Orthostatic hypotension: Secondary | ICD-10-CM | POA: Insufficient documentation

## 2019-03-28 DIAGNOSIS — E119 Type 2 diabetes mellitus without complications: Secondary | ICD-10-CM | POA: Diagnosis not present

## 2019-03-28 DIAGNOSIS — Z7901 Long term (current) use of anticoagulants: Secondary | ICD-10-CM | POA: Diagnosis not present

## 2019-03-28 DIAGNOSIS — Z79899 Other long term (current) drug therapy: Secondary | ICD-10-CM | POA: Diagnosis not present

## 2019-03-28 DIAGNOSIS — I10 Essential (primary) hypertension: Secondary | ICD-10-CM | POA: Insufficient documentation

## 2019-03-28 LAB — BASIC METABOLIC PANEL
Anion gap: 11 (ref 5–15)
BUN: 27 mg/dL — ABNORMAL HIGH (ref 6–20)
CO2: 28 mmol/L (ref 22–32)
Calcium: 10.7 mg/dL — ABNORMAL HIGH (ref 8.9–10.3)
Chloride: 94 mmol/L — ABNORMAL LOW (ref 98–111)
Creatinine, Ser: 1.57 mg/dL — ABNORMAL HIGH (ref 0.44–1.00)
GFR calc Af Amer: 41 mL/min — ABNORMAL LOW (ref >60)
GFR calc non Af Amer: 36 mL/min — ABNORMAL LOW (ref >60)
Glucose, Bld: 107 mg/dL — ABNORMAL HIGH (ref 70–99)
Potassium: 3.3 mmol/L — ABNORMAL LOW (ref 3.5–5.1)
Sodium: 133 mmol/L — ABNORMAL LOW (ref 135–145)

## 2019-03-28 LAB — CBC
HCT: 37.9 % (ref 36.0–46.0)
Hemoglobin: 12 g/dL (ref 12.0–15.0)
MCH: 28.5 pg (ref 26.0–34.0)
MCHC: 31.7 g/dL (ref 30.0–36.0)
MCV: 90 fL (ref 80.0–100.0)
Platelets: 368 10*3/uL (ref 150–400)
RBC: 4.21 MIL/uL (ref 3.87–5.11)
RDW: 13.6 % (ref 11.5–15.5)
WBC: 5.9 10*3/uL (ref 4.0–10.5)
nRBC: 0 % (ref 0.0–0.2)

## 2019-03-28 LAB — URINALYSIS, ROUTINE W REFLEX MICROSCOPIC
Bilirubin Urine: NEGATIVE
Glucose, UA: NEGATIVE mg/dL
Hgb urine dipstick: NEGATIVE
Ketones, ur: NEGATIVE mg/dL
Leukocytes,Ua: NEGATIVE
Nitrite: NEGATIVE
Protein, ur: NEGATIVE mg/dL
Specific Gravity, Urine: 1.009 (ref 1.005–1.030)
pH: 5 (ref 5.0–8.0)

## 2019-03-28 MED ORDER — POTASSIUM CHLORIDE CRYS ER 20 MEQ PO TBCR
40.0000 meq | EXTENDED_RELEASE_TABLET | Freq: Two times a day (BID) | ORAL | Status: DC
  Administered 2019-03-28: 13:00:00 40 meq via ORAL
  Filled 2019-03-28: qty 2

## 2019-03-28 MED ORDER — SODIUM CHLORIDE 0.9% FLUSH: 3.0000 mL | Freq: Once | INTRAVENOUS | Status: DC

## 2019-03-28 MED ORDER — POTASSIUM CHLORIDE CRYS ER 20 MEQ PO TBCR: 20.0000 meq | EXTENDED_RELEASE_TABLET | Freq: Every day | ORAL | 0 refills | Status: DC

## 2019-03-28 MED ORDER — SODIUM CHLORIDE 0.9 % IV BOLUS
1000.0000 mL | Freq: Once | INTRAVENOUS | Status: AC
  Administered 2019-03-28: 12:00:00 1000 mL via INTRAVENOUS

## 2019-03-28 MED ORDER — SODIUM CHLORIDE 0.9 % IV BOLUS
1000.0000 mL | Freq: Once | INTRAVENOUS | Status: AC
  Administered 2019-03-28: 15:00:00 1000 mL via INTRAVENOUS

## 2019-03-28 NOTE — ED Notes (Signed)
1L bolus started  

## 2019-03-28 NOTE — Discharge Instructions (Addendum)
You have been seen today for low blood pressure. Please read and follow all provided instructions. Return to the emergency room for worsening condition or new concerning symptoms.   Your potassium was low today. Your blood pressure improved after IV fluids.   1. Medications:  STOP taking your amlodipine. Continue other  home medications Prescription has been sent to your pharmacy for potassium pills, please take as prescribed.   2. Treatment: rest, drink plenty of fluids  Check your blood pressure daily. In the morning when you first wake up is best.  If the top number (systolic) is below 779, do not take your other two blood pressure medicines that day.  Keep a diary of your daily blood pressure readings.   3. Follow Up: Please follow up with your primary doctor in 2-5 days for discussion of your diagnoses and further evaluation after today's visit; Call today to arrange your follow up.  You should have your potassium rechecked within 1 week -You also need to talk to your doctor about scheduling the echocardiogram test for your heart please  It is also a possibility that you have an allergic reaction to any of the medicines that you have been prescribed - Everybody reacts differently to medications and while MOST people have no trouble with most medicines, you may have a reaction such as nausea, vomiting, rash, swelling, shortness of breath. If this is the case, please stop taking the medicine immediately and contact your physician.  ?

## 2019-03-28 NOTE — ED Notes (Signed)
Pt has had periods of lightheadedness for years, is on 3 different BP meds and has not had a change in regimen recently. Pt passed out and fell on 03/09/2019 after standing and broke R foot. Pt has been having lightheaded spells upon standing since.

## 2019-03-28 NOTE — Telephone Encounter (Signed)
[facility] in with patient and found symptomatic BP drop with ortho static pressure readings. BP sitting 90/60, standing 70/40 with dizziness.  Recheck sitting 110/70, and standing 70/? With dizziness reported again HR without significant change 70's. Pt takes medication for BP every evening. Pt gives Hx of recent fall in bathroom that resulted in her fx lower extremity.  She states she got dizzy and fell. Per protocol patient will go to ER Physical therapist is with patient and will call 911 for transport.  Reason for Disposition . [id number] Systolic BP < 90 AND [id number] dizzy, lightheaded, or weak  Answer Assessment - Initial Assessment Questions 1. BLOOD PRESSURE: "What is the blood pressure?" "Did you take at least two measurements 5 minutes apart?"     90/60 sitting  And 70/40 standing, later 110/70 2. ONSET: "When did you take your blood pressure?"    Today by physical therapist 3. HOW: "How did you obtain the blood pressure?" (e.g., visiting nurse, automatic home BP monitor)    Physical therapist 4. HISTORY: "Do you have a history of low blood pressure?" "What is your blood pressure normally?"     unsure 5. MEDICATIONS: "Are you taking any medications for blood pressure?" If yes: "Have they been changed recently?"     Pain medication 6. PULSE RATE: "Do you know what your pulse rate is?"      70's 7. OTHER SYMPTOMS: "Have you been sick recently?" "Have you had a recent injury?"     Fall with Fx from dizzy spell in bathroom 8. PREGNANCY: "Is there any chance you are pregnant?" "When was your last menstrual period?"     No  Protocols used: LOW BLOOD PRESSURE-A-AH

## 2019-03-28 NOTE — ED Triage Notes (Signed)
Patient reports having increased frequency of near-syncopal episodes - states she's had orthostatic hypotension for years, but doesn't know why they happen. Her PT checked her BP this morning and got standing 90/60 - sitting 70/40 and suggested she go to ED. A&O x 4.

## 2019-03-28 NOTE — ED Provider Notes (Signed)
[facility] EMERGENCY DEPARTMENT Provider Note   CSN: [id number] Arrival date & time: 03/28/19  1116     History   Chief Complaint Chief Complaint  Patient presents with   Orthostatic Hypotension    HPI Madeline Chandler is a 58 y.o. female with past medical history significant of hypertension, diabetes, breast cancer s/p chemo and radiation presents emergency department today with chief complaint of hypotension. This has been going on for 1 month.  Patient states she was having her vital signs checked before home health physical therapy and her systolic pressures in the 70s.  She admits to feeling dizzy when she is hypotensive, describing it as the room is spinning. On 03/09/2019 patient had syncopal episode and unfortunately fell and broke right foot.  Since then she has had daily episodes of feeling lightheaded.  She has not syncopized or fallen.  She states whenever she feels this way she sits down and quickly feels better. She denies any other symptoms including fever, chills, cough, shortness of breath, abdominal pain, nausea, vomiting, urinary symptoms, diarrhea. History provided by patient with additional history obtained from chart review.      Past Medical History:  Diagnosis Date   Anxiety    Arthritis    "hips" (10/28/2016)   Breast cancer, right breast ([place]) 1997; 2018   S/P chemo & radiation;    Bulging lumbar disc    2   Depression    Diabetes mellitus without complication (HCC)    GERD (gastroesophageal reflux disease)    History of colonic polyps    Dr Collene Mares   Hypertension    Migraine    "none in the last few years; had them mostly in late 20's/mid 30's" (10/28/2016)   OSA (obstructive sleep apnea) 02/19/2018   Restless leg syndrome     Patient Active Problem List   Diagnosis Date Noted   Ankle fracture 03/09/2019   Syncope 03/09/2019   Hypokalemia 03/09/2019   Near syncope    Greater trochanteric bursitis of left hip  08/09/2018   Left wrist pain 06/18/2018   OSA (obstructive sleep apnea) 02/19/2018   Chronic cough 02/19/2018   Diabetes mellitus type 2 in obese ([place]) 01/26/2018   Prediabetes 01/22/2018   De Quervain's tenosynovitis, bilateral 11/16/2017   Acquired absence of breast and nipple 03/07/2017   Breast cancer, right ([place]) 10/28/2016   Genetic testing 10/01/2016   Neck pain 12/02/2015   Preoperative clearance 11/10/2015   Peripheral edema 10/05/2015   Cervical pain (neck) 10/05/2015   S/P left THA, AA 06/23/2015   Obese 05/13/2015   Avascular necrosis of bones of both hips ([place]) 04/05/2015   Hypertension 02/27/2015   Muscle spasm 02/27/2015   Pain in joint, shoulder region 09/05/2014   Visit for preventive health examination 12/12/2013   Leg swelling 12/08/2013   Other abnormal glucose 02/23/2012   Nonspecific abnormal electrocardiogram (ECG) (EKG) 02/23/2012   Upper respiratory infection 07/16/2010   GERD 10/23/2009   Anxiety state 09/02/2009   DEPRESSION 07/10/2008   BREAST CANCER, HX OF 07/10/2008   COLONIC POLYPS, HX OF 07/10/2008   FIBROIDS, UTERUS 02/15/2008   MIGRAINE HEADACHE 02/15/2008   DILATION AND CURETTAGE, HX OF 02/15/2008   Unspecified essential hypertension 09/25/2007    Past Surgical History:  Procedure Laterality Date   ABDOMINAL HYSTERECTOMY  2000s   ANTERIOR CERVICAL DECOMP/DISCECTOMY FUSION N/A 12/02/2015   Procedure: ANTERIOR CERVICAL DECOMPRESSION/DISCECTOMY FUSION C4 - C7  3 LEVELS;  Surgeon: Melina Schools, MD;  Location: [facility]  OR;  Service: Orthopedics;  Laterality: N/A;   BACK SURGERY     BREAST BIOPSY Right 1997; 08/2016   BREAST LUMPECTOMY Right 1997   BREAST RECONSTRUCTION Right 07/07/2017   Procedure: RIGHT NIPPLE AEROLA RECONSTRUCTION WITH LOCAL FLAP FROM RIGHT GROIN;  Surgeon: Irene Limbo, MD;  Location: [facility];  Service: Plastics;  Laterality: Right;   BREAST REDUCTION SURGERY Left  03/07/2017   Procedure: LEFT BREAST REDUCTION FOR SYMMETRY;  Surgeon: Irene Limbo, MD;  Location: [place];  Service: Plastics;  Laterality: Left;   COLONOSCOPY W/ POLYPECTOMY  2010   Dr Collene Mares   DILATION AND CURETTAGE OF UTERUS  1990s   JOINT REPLACEMENT     LATISSIMUS FLAP TO BREAST Right 10/28/2016   Procedure: LATISSIMUS FLAP TO BREAST WITH PLACEMENT OF TISSUE EXPANDER ON THE RIGHT;  Surgeon: Irene Limbo, MD;  Location: [place];  Service: Plastics;  Laterality: Right;  RFNA   MASTECTOMY Right 10/28/2016   RECONSTRUCTION BREAST W/ LATISSIMUS DORSI FLAP Right 10/28/2016    WITH PLACEMENT OF TISSUE EXPANDER    REMOVAL OF BILATERAL TISSUE EXPANDERS WITH PLACEMENT OF BILATERAL BREAST IMPLANTS Right 03/07/2017   Procedure: REMOVAL OF RIGHT BREAST TISSUE EXPANDER WITH PLACEMENT OF RIGHT BREAST IMPLANT;  Surgeon: Irene Limbo, MD;  Location: [facility];  Service: Plastics;  Laterality: Right;   TIBIA IM NAIL INSERTION Left 03/09/2019   Procedure: INTRAMEDULLARY (IM) NAIL TIBIAL;  Surgeon: Erle Crocker, MD;  Location: [facility];  Service: Orthopedics;  Laterality: Left;   TISSUE EXPANDER PLACEMENT Right 10/28/2016   Procedure: TISSUE EXPANDER;  Surgeon: Irene Limbo, MD;  Location: [facility];  Service: Plastics;  Laterality: Right;   TOTAL HIP ARTHROPLASTY Right 05/12/2015   Procedure: RIGHT TOTAL HIP ARTHROPLASTY ANTERIOR APPROACH;  Surgeon: Paralee Cancel, MD;  Location: [facility];  Service: Orthopedics;  Laterality: Right;   TOTAL HIP ARTHROPLASTY Left 06/23/2015   Procedure: LEFT TOTAL HIP ARTHROPLASTY ANTERIOR APPROACH;  Surgeon: Paralee Cancel, MD;  Location: [facility];  Service: Orthopedics;  Laterality: Left;   TOTAL MASTECTOMY Right 10/28/2016   Procedure: RIGHT MASTECTOMY;  Surgeon: Alphonsa Overall, MD;  Location: [facility];  Service: General;  Laterality: Right;     OB History   No obstetric history on file.      Home Medications    Prior to  Admission medications   Medication Sig Start Date End Date Taking? Authorizing Provider  amLODipine (NORVASC) 5 MG tablet TAKE 1 TABLET BY MOUTH EVERY DAY Patient taking differently: Take 5 mg by mouth daily.  09/17/18   Shelda Pal, DO  amphetamine-dextroamphetamine (ADDERALL) 20 MG tablet Take 20 mg by mouth daily. 06/21/18   [provider]  carvedilol (COREG) 25 MG tablet TAKE 1 TABLET (25 MG TOTAL) BY MOUTH 2 (TWO) TIMES DAILY WITH A MEAL. 03/22/19   Shelda Pal, DO  DULoxetine (CYMBALTA) 30 MG capsule TAKE 1 CAPSULE BY MOUTH EVERY DAY Patient taking differently: Take 30 mg by mouth daily. TAKE 1 CAPSULE BY MOUTH EVERY DAY 01/03/19   Shelda Pal, DO  enoxaparin (LOVENOX) 40 MG/0.4ML injection Inject 0.4 mLs (40 mg total) into the skin daily for 14 days. 03/10/19 03/24/19  Erle Crocker, MD  glucose blood [facility] VERIO) test strip Use once daily to check blood sugar.  DX E11.9 03/07/18   Shelda Pal, DO  letrozole [facility]) 2.5 MG tablet Take 1 tablet (2.5 mg total) by mouth daily. 02/04/19   Ladell Pier,  MD  losartan-hydrochlorothiazide (HYZAAR) 100-25 MG tablet TAKE 1 TABLET BY MOUTH EVERY DAY Patient taking differently: Take 1 tablet by mouth daily.  12/24/18   Wendling, Crosby Oyster, DO  ONETOUCH DELICA LANCETS FINE MISC Use as directed once daily to check blood sugar.  DXE11.9 04/27/18   Wendling, Crosby Oyster, DO  OZEMPIC, 1 MG/DOSE, 2 MG/1.5ML SOPN INJECT 1 MG INTO THE SKIN ONCE A WEEK. 03/22/19   Shelda Pal, DO  pantoprazole (PROTONIX) 40 MG tablet TAKE 1 TABLET BY MOUTH EVERY DAY IN THE EVENING Patient taking differently: Take 40 mg by mouth daily.  01/02/19   Shelda Pal, DO  potassium chloride SA (K-DUR) 20 MEQ tablet Take 1 tablet (20 mEq total) by mouth daily for 5 days. 03/28/19 04/02/19  Verne Lanuza, Harley Hallmark, PA-C    Family History Family History  Problem Relation Age of Onset   Hypertension  Mother    Diabetes Mother    Breast cancer Mother 27   Hypertension Sister    Hypertension Brother        X 3   Cancer Maternal Aunt        leukemia   Breast cancer Paternal Aunt        diagnosed in 69s or 48s   Cancer Paternal Uncle        unknown type   Cancer Paternal Aunt        unknown type   Breast cancer Cousin 70   Stroke Neg Hx    Heart disease Neg Hx     Social History Social History   Tobacco Use   Smoking status: Never Smoker   Smokeless tobacco: Never Used  Substance Use Topics   Alcohol use: No   Drug use: No     Allergies   Prednisone   Review of Systems Review of Systems  Constitutional: Negative for chills and fever.  HENT: Negative for congestion, ear discharge, ear pain, sinus pressure, sinus pain and sore throat.   Eyes: Negative for pain and redness.  Respiratory: Negative for cough and shortness of breath.   Cardiovascular: Negative for chest pain.  Gastrointestinal: Negative for abdominal pain, constipation, diarrhea, nausea and vomiting.  Genitourinary: Negative for dysuria and hematuria.  Musculoskeletal: Negative for back pain and neck pain.  Skin: Negative for wound.  Neurological: Positive for light-headedness. Negative for weakness, numbness and headaches.     Physical Exam Updated Vital Signs BP (!) 108/56    Pulse 76    Temp 98.6 F (37 C) (Oral)    Resp 15    Ht 5' 5.5" (1.664 m)    Wt 88.5 kg    SpO2 92%    BMI 31.96 kg/m   Physical Exam Vitals signs and nursing note reviewed.  Constitutional:      General: She is not in acute distress.    Appearance: She is not ill-appearing.     Comments: Pt resting comfortably on exam stretcher. She has boot on right lower extremity.  HENT:     Head: Normocephalic and atraumatic.     Right Ear: Tympanic membrane and external ear normal.     Left Ear: Tympanic membrane and external ear normal.     Nose: Nose normal.     Mouth/Throat:     Mouth: Mucous membranes are  dry.     Pharynx: Oropharynx is clear.  Eyes:     General: No scleral icterus.       Right eye: No discharge.  Left eye: No discharge.     Extraocular Movements: Extraocular movements intact.     Conjunctiva/sclera: Conjunctivae normal.     Pupils: Pupils are equal, round, and reactive to light.  Neck:     Musculoskeletal: Normal range of motion.     Vascular: No JVD.  Cardiovascular:     Rate and Rhythm: Normal rate and regular rhythm.     Pulses: Normal pulses.          Radial pulses are 2+ on the right side and 2+ on the left side.     Heart sounds: Normal heart sounds. No murmur.  Pulmonary:     Comments: Lungs clear to auscultation in all fields. Symmetric chest rise. No wheezing, rales, or rhonchi. Abdominal:     Comments: Abdomen is soft, non-distended, and non-tender in all quadrants. No rigidity, no guarding. No peritoneal signs.  Musculoskeletal: Normal range of motion.  Skin:    General: Skin is warm and dry.     Capillary Refill: Capillary refill takes less than 2 seconds.  Neurological:     Mental Status: She is oriented to person, place, and time.     GCS: GCS eye subscore is 4. GCS verbal subscore is 5. GCS motor subscore is 6.     Comments: Fluent speech, no facial droop.  Psychiatric:        Behavior: Behavior normal.      ED Treatments / Results  Labs (all labs ordered are listed, but only abnormal results are displayed) Labs Reviewed  BASIC METABOLIC PANEL - Abnormal; Notable for the following components:      Result Value   Sodium 133 (*)    Potassium 3.3 (*)    Chloride 94 (*)    Glucose, Bld 107 (*)    BUN 27 (*)    Creatinine, Ser 1.57 (*)    Calcium 10.7 (*)    GFR calc non Af Amer 36 (*)    GFR calc Af Amer 41 (*)    All other components within normal limits  URINALYSIS, ROUTINE W REFLEX MICROSCOPIC - Abnormal; Notable for the following components:   Color, Urine STRAW (*)    All other components within normal limits  CBC  CBG  MONITORING, ED    EKG EKG Interpretation  Date/Time:  Thursday March 28 2019 11:41:59 EDT Ventricular Rate:  66 PR Interval:  152 QRS Duration: 94 QT Interval:  412 QTC Calculation: 431 R Axis:   -7 Text Interpretation:  Normal sinus rhythm Minimal voltage criteria for LVH, may be normal variant Borderline ECG Confirmed by Elnora Morrison [phone]) on 03/28/2019 12:26:43 PM   Radiology Dg Chest Portable 1 View  Result Date: 03/28/2019 CLINICAL DATA:  Syncopal episode resulting in a fall. EXAM: PORTABLE CHEST 1 VIEW COMPARISON:  03/09/2019. FINDINGS: The cardiac silhouette remains near the upper limit of normal in size. Mildly tortuous aorta. Clear lungs with normal vascularity. Stable surgical clips on the right, including the axilla. Cervical spine fixation hardware. IMPRESSION: No acute abnormality. Electronically Signed   By: Claudie Revering M.D.   On: 03/28/2019 13:22    Procedures Procedures (including critical care time)  Medications Ordered in ED Medications  sodium chloride flush (NS) 0.9 % injection 3 mL (3 mLs Intravenous Not Given 03/28/19 1222)  potassium chloride SA (K-DUR) CR tablet 40 mEq (40 mEq Oral Given 03/28/19 1312)  sodium chloride 0.9 % bolus 1,000 mL (0 mLs Intravenous Stopped 03/28/19 1330)  sodium chloride 0.9 % bolus 1,000 mL (  1,000 mLs Intravenous New Bag/Given 03/28/19 1432)     Initial Impression / Assessment and Plan / ED Course  I have reviewed the triage vital signs and the nursing notes.  Pertinent labs & imaging results that were available during my care of the patient were reviewed by me and considered in my medical decision making (see chart for details).  58 year old female presents with orthostatic hypotension.  On arrival she is overall well-appearing, no acute distress. She appears dehydrated wth dry mucous membranes. She is orthostatic positive. No murmur heard on exam  Blood work today shows hypokalemia of 3.3, will replete orally.  Also with  elevated creatinine at 1.57 compared to 6 days ago and it was 1.05.  IV fluids given.  CBC unremarkable, no leukocytosis, hemoglobin is stable at 12.0.  Chest x-ray viewed by me shows no infiltrate. EKG is without ischemic changes. UA without signs of infection. After 2L of IVF pt reports dizziness has resolved and she feels better. Repeat orthostatic vitals are negative.  Will recommend she stop amlodipine and muscle relaxer as well as keep blood pressure diary with reading daily and close follow up with pcp. If systolic is <212 pt advised to hold carvedilol and losartan-hydrochlorothiazide that day. Pt will need to follow up with pcp to have potassium rechecked and to discuss scheduling echocardiogram that appears to have been ordered by pcp but not yet scheduled.  The patient appears reasonably screened and/or stabilized for discharge and I doubt any other medical condition or other [facility] requiring further screening, evaluation, or treatment in the ED at this time prior to discharge. The patient is safe for discharge with strict return precautions discussed. The patient was discussed with and seen by Dr. Reather Converse who agrees with the treatment plan.   This note was prepared using Dragon voice recognition software and may include unintentional dictation errors due to the inherent limitations of voice recognition software.    Final Clinical Impressions(s) / ED Diagnoses   Final diagnoses:  Orthostatic hypotension    ED Discharge Orders         Ordered    potassium chloride SA (K-DUR) 20 MEQ tablet  Daily     03/28/19 1626           Flint Melter 03/28/19 1635    Elnora Morrison, MD 03/30/19 [phone]

## 2019-03-28 NOTE — ED Notes (Signed)
Orthostatic VS:  HR: 81 O2: 99 BP: 130/82  HR: 87 O2: 96 BP: 138/69  HR: 80 O2: 100 BP: 123/75

## 2019-03-28 NOTE — ED Notes (Signed)
Pt provided with ginger ale and crackers

## 2019-03-29 ENCOUNTER — Other Ambulatory Visit: Payer: Self-pay

## 2019-04-01 ENCOUNTER — Other Ambulatory Visit: Payer: Self-pay

## 2019-04-01 ENCOUNTER — Encounter: Payer: Self-pay | Admitting: Family Medicine

## 2019-04-01 ENCOUNTER — Ambulatory Visit (INDEPENDENT_AMBULATORY_CARE_PROVIDER_SITE_OTHER): Payer: 59 | Admitting: Family Medicine

## 2019-04-01 VITALS — BP 142/82 | HR 98 | Temp 98.1°F

## 2019-04-01 DIAGNOSIS — E876 Hypokalemia: Secondary | ICD-10-CM | POA: Diagnosis not present

## 2019-04-01 DIAGNOSIS — I10 Essential (primary) hypertension: Secondary | ICD-10-CM

## 2019-04-01 DIAGNOSIS — I952 Hypotension due to drugs: Secondary | ICD-10-CM | POA: Diagnosis not present

## 2019-04-01 LAB — BASIC METABOLIC PANEL
BUN: 13 mg/dL (ref 6–23)
CO2: 30 mEq/L (ref 19–32)
Calcium: 10.5 mg/dL (ref 8.4–10.5)
Chloride: 101 mEq/L (ref 96–112)
Creatinine, Ser: 0.97 mg/dL (ref 0.40–1.20)
GFR: 71.1 mL/min (ref >60.00)
Glucose, Bld: 91 mg/dL (ref 70–99)
Potassium: 3.9 mEq/L (ref 3.5–5.1)
Sodium: 140 mEq/L (ref 135–145)

## 2019-04-01 NOTE — Progress Notes (Signed)
Chief Complaint  Patient presents with  . Follow-up    Subjective: Patient is a 58 y.o. female here for ER f/u.  Went to ED after our last visit for dizziness. Dx'd w orthostatic hypotension. Norvasc was d/c'd, ekg neg. Has not had echo performed yet. She is taking Coreg and Hyzaar 100-25 mg/d.   Found to have low K in the ER, did not take supp. Compliant with other meds.  Hx of elevated Ca. Needs Ca and PTH drawn today. No s/s's.   ROS: Heart: Denies chest pain  Lungs: Denies SOB   Past Medical History:  Diagnosis Date  . Anxiety   . Arthritis    "hips" (10/28/2016)  . Breast cancer, right breast ([place]) 1997; 2018   S/P chemo & radiation;   . Bulging lumbar disc    2  . Depression   . Diabetes mellitus without complication ([place])   . GERD (gastroesophageal reflux disease)   . History of colonic polyps    Dr Collene Mares  . Hypertension   . Migraine    "none in the last few years; had them mostly in late 20's/mid 30's" (10/28/2016)  . OSA (obstructive sleep apnea) 02/19/2018  . Restless leg syndrome     Objective: BP (!) 142/82 (BP Location: Left Arm, Patient Position: Sitting, Cuff Size: Large)   Pulse 98   Temp 98.1 F (36.7 C) (Oral)   SpO2 96%  General: Awake, appears stated age HEENT: MMM, EOMi Heart: RRR, no bruits, no LE edema Lungs: CTAB, no rales, wheezes or rhonchi. No accessory muscle use Psych: Age appropriate judgment and insight, normal affect and mood  Assessment and Plan: Hypotension due to drugs Stay off of Norvasc, monitor BP at home. I would like to see her in 2 weeks. Stay hydrated.  If she is not doing much better, I will refer her to the cardiology team for lightheaded symptoms with regular blood pressure.  Hypercalcemia - Plan: Basic metabolic panel, PTH, intact (no Ca)  Essential hypertension   Hypokalemia  The patient voiced understanding and agreement to the plan.  [place], DO 04/01/19  3:32 PM

## 2019-04-01 NOTE — Patient Instructions (Addendum)
Give [place] 2-3 business days to get the results of your labs back.   Try to drink around 60 oz of fluids daily.   Monitor your blood pressure at home. If 140 or higher on the top, let me know.   Stay off of your amlodipine for now.   Let us know if you need anything.

## 2019-04-02 ENCOUNTER — Ambulatory Visit (HOSPITAL_BASED_OUTPATIENT_CLINIC_OR_DEPARTMENT_OTHER): Payer: 59

## 2019-04-02 LAB — PARATHYROID HORMONE, INTACT (NO CA): PTH: 32 pg/mL (ref 14–64)

## 2019-04-03 ENCOUNTER — Ambulatory Visit (INDEPENDENT_AMBULATORY_CARE_PROVIDER_SITE_OTHER): Payer: 59 | Admitting: Family Medicine

## 2019-04-03 ENCOUNTER — Encounter: Payer: Self-pay | Admitting: Family Medicine

## 2019-04-03 ENCOUNTER — Other Ambulatory Visit: Payer: Self-pay

## 2019-04-03 VITALS — BP 142/100 | HR 98 | Temp 98.1°F

## 2019-04-03 DIAGNOSIS — I10 Essential (primary) hypertension: Secondary | ICD-10-CM | POA: Diagnosis not present

## 2019-04-03 DIAGNOSIS — R229 Localized swelling, mass and lump, unspecified: Secondary | ICD-10-CM | POA: Diagnosis not present

## 2019-04-03 DIAGNOSIS — IMO0002 Reserved for concepts with insufficient information to code with codable children: Secondary | ICD-10-CM

## 2019-04-03 NOTE — Patient Instructions (Addendum)
We will place the order for an untrasound but it seems to be a non-worrisome skin issue (lipoma).   Keep an eye on your blood pressure at home. If 140 or greater on the top or 90 or higher on the bottom, let me know.   I recommend getting the flu shot in mid October. This suggestion would change if the CDC comes out with a different recommendation.   Let us know if you need anything.  Lipoma  A lipoma is a noncancerous (benign) tumor that is made up of fat cells. This is a very common type of soft-tissue growth. Lipomas are usually found under the skin (subcutaneous). They may occur in any tissue of the body that contains fat. Common areas for lipomas to appear include the back, shoulders, buttocks, and thighs.  Lipomas grow slowly, and they are usually painless. Most lipomas do not cause problems and do not require treatment. What are the causes? The cause of this condition is not known. What increases the risk? You are more likely to develop this condition if:  You are 46-12 years old.  You have a family history of lipomas. What are the signs or symptoms? A lipoma usually appears as a small, round bump under the skin. In most cases, the lump will:  Feel soft or rubbery.  Not cause pain or other symptoms. However, if a lipoma is located in an area where it pushes on nerves, it can become painful or cause other symptoms. How is this diagnosed? A lipoma can usually be diagnosed with a physical exam. You may also have tests to confirm the diagnosis and to rule out other conditions. Tests may include:  Imaging tests, such as a CT scan or MRI.  Removal of a tissue sample to be looked at under a microscope (biopsy). How is this treated? Treatment for this condition depends on the size of the lipoma and whether it is causing any symptoms.  For small lipomas that are not causing problems, no treatment is needed.  If a lipoma is bigger or it causes problems, surgery may be done to  remove the lipoma. Lipomas can also be removed to improve appearance. Most often, the procedure is done after applying a medicine that numbs the area (local anesthetic). Follow these instructions at home:  Watch your lipoma for any changes.  Keep all follow-up visits as told by your health care provider. This is important. Contact a health care provider if:  Your lipoma becomes larger or hard.  Your lipoma becomes painful, red, or increasingly swollen. These could be signs of infection or a more serious condition. Get help right away if:  You develop tingling or numbness in an area near the lipoma. This could indicate that the lipoma is causing nerve damage. Summary  A lipoma is a noncancerous tumor that is made up of fat cells.  Most lipomas do not cause problems and do not require treatment.  If a lipoma is bigger or it causes problems, surgery may be done to remove the lipoma. This information is not intended to replace advice given to you by your health care provider. Make sure you discuss any questions you have with your health care provider. Document Released: 07/15/2002 Document Revised: 07/11/2017 Document Reviewed: 07/11/2017 Elsevier Patient Education  [place].

## 2019-04-03 NOTE — Progress Notes (Signed)
Chief Complaint  Patient presents with  . knot on left collar bone    Madeline Chandler is a 58 y.o. female here for a skin complaint.  Duration: 1 day Location: L collar bone Pruritic? No Painful? No Drainage? No New soaps/lotions/topicals/detergents? No Sick contacts? No Other associated symptoms: none; denies unintentional wt loss (is on Ozempic), night sweats, injury Therapies tried thus far: none  BP has been elevated. She was dealing with lightheadedness/dehydration issues so her medication was not changed. She has been recovering from an ortho surgery after a fracture and reports the issue with mobility is causing her to work harder and that is why her BP is high.   ROS:  Const: No fevers Skin: As noted in HPI  Past Medical History:  Diagnosis Date  . Anxiety   . Arthritis    "hips" (10/28/2016)  . Breast cancer, right breast ([facility]) 1997; 2018   S/P chemo & radiation;   . Bulging lumbar disc    2  . Depression   . Diabetes mellitus without complication ([facility])   . GERD (gastroesophageal reflux disease)   . History of colonic polyps    Dr Collene Mares  . Hypertension   . Migraine    "none in the last few years; had them mostly in late 20's/mid 30's" (10/28/2016)  . OSA (obstructive sleep apnea) 02/19/2018  . Restless leg syndrome     BP (!) 142/100 (BP Location: Left Arm, Patient Position: Sitting, Cuff Size: Normal)   Pulse 98   Temp 98.1 F (36.7 C) (Oral)   SpO2 94%  Gen: awake, alert, appearing stated age Heart: RRR Lungs: No accessory muscle use Skin: 2 cm x 2.5 cm soft tissue elevation over medial clavicle on L. No drainage, erythema, TTP, fluctuance, excoriation Psych: Age appropriate judgment and insight  Lump - Plan: [place] CHEST SOFT TISSUE; concern for lipoma but will ck [place] to be sure  Essential hypertension; monitor BP at home, parameters provided in AVS, stay hydrated.  Orders as above. F/u prn. The patient voiced understanding and agreement to the  plan.  [place], DO 04/03/19 4:43 PM

## 2019-04-08 ENCOUNTER — Other Ambulatory Visit: Payer: Self-pay

## 2019-04-08 ENCOUNTER — Ambulatory Visit (HOSPITAL_BASED_OUTPATIENT_CLINIC_OR_DEPARTMENT_OTHER)
Admission: RE | Admit: 2019-04-08 | Discharge: 2019-04-08 | Disposition: A | Discharge status: Home / Self Care | Payer: 59 | Source: Ambulatory Visit | Attending: Family Medicine | Admitting: Family Medicine

## 2019-04-08 DIAGNOSIS — R229 Localized swelling, mass and lump, unspecified: Secondary | ICD-10-CM | POA: Diagnosis not present

## 2019-04-08 DIAGNOSIS — IMO0002 Reserved for concepts with insufficient information to code with codable children: Secondary | ICD-10-CM

## 2019-04-09 ENCOUNTER — Ambulatory Visit (HOSPITAL_BASED_OUTPATIENT_CLINIC_OR_DEPARTMENT_OTHER)
Admission: RE | Admit: 2019-04-09 | Discharge: 2019-04-09 | Disposition: A | Discharge status: Home / Self Care | Payer: 59 | Source: Ambulatory Visit | Attending: Family Medicine | Admitting: Family Medicine

## 2019-04-09 DIAGNOSIS — R55 Syncope and collapse: Secondary | ICD-10-CM

## 2019-04-09 NOTE — Progress Notes (Signed)
  Echocardiogram 2D Echocardiogram has been performed.  Cardell Peach 04/09/2019, 12:10 PM

## 2019-04-15 ENCOUNTER — Other Ambulatory Visit: Payer: Self-pay | Admitting: Family Medicine

## 2019-05-13 ENCOUNTER — Other Ambulatory Visit: Payer: Self-pay | Admitting: Family Medicine

## 2019-05-23 ENCOUNTER — Encounter: Payer: Self-pay | Admitting: Family Medicine

## 2019-05-23 LAB — HM DIABETES EYE EXAM

## 2019-05-31 ENCOUNTER — Other Ambulatory Visit: Payer: Self-pay | Admitting: Family Medicine

## 2019-05-31 DIAGNOSIS — F411 Generalized anxiety disorder: Secondary | ICD-10-CM

## 2019-05-31 MED ORDER — DULOXETINE HCL 30 MG PO CPEP: ORAL_CAPSULE | ORAL | 0 refills | Status: DC

## 2019-05-31 NOTE — Telephone Encounter (Signed)
Medication Refill - Medication: DULoxetine (CYMBALTA) 30 MG capsule    Preferred Pharmacy (with phone number or street name):  [id number] - [place], [address] [phone] (Phone) [phone] (Fax)

## 2019-06-03 ENCOUNTER — Encounter: Payer: Self-pay | Admitting: Family Medicine

## 2019-06-05 ENCOUNTER — Telehealth: Payer: Self-pay | Admitting: Family Medicine

## 2019-06-05 NOTE — Telephone Encounter (Signed)
Chart has been updated.

## 2019-06-05 NOTE — Telephone Encounter (Signed)
Pt called in to make provider aware that she [facility] [address] on [facility]/03/19.

## 2019-06-15 ENCOUNTER — Other Ambulatory Visit: Payer: Self-pay | Admitting: Family Medicine

## 2019-06-27 ENCOUNTER — Telehealth: Payer: Self-pay | Admitting: Oncology

## 2019-06-27 ENCOUNTER — Inpatient Hospital Stay: Payer: 59 | Attending: Oncology | Admitting: Oncology

## 2019-06-27 NOTE — Telephone Encounter (Signed)
Called pt per 11/19 sch message - unable to reach pt to reschedule and unable to leave message. Scheduled apt and mailed letter

## 2019-07-15 ENCOUNTER — Other Ambulatory Visit: Payer: Self-pay | Admitting: Family Medicine

## 2019-07-27 ENCOUNTER — Other Ambulatory Visit: Payer: Self-pay | Admitting: Family Medicine

## 2019-07-27 DIAGNOSIS — I10 Essential (primary) hypertension: Secondary | ICD-10-CM

## 2019-08-06 ENCOUNTER — Encounter: Payer: Self-pay | Admitting: Family Medicine

## 2019-08-06 ENCOUNTER — Other Ambulatory Visit: Payer: Self-pay

## 2019-08-06 ENCOUNTER — Telehealth: Payer: Self-pay | Admitting: Family Medicine

## 2019-08-06 ENCOUNTER — Ambulatory Visit (INDEPENDENT_AMBULATORY_CARE_PROVIDER_SITE_OTHER): Payer: 59 | Admitting: Family Medicine

## 2019-08-06 VITALS — BP 110/74 | HR 85 | Temp 97.1°F | Ht 65.5 in | Wt 196.0 lb

## 2019-08-06 DIAGNOSIS — E669 Obesity, unspecified: Secondary | ICD-10-CM | POA: Diagnosis not present

## 2019-08-06 DIAGNOSIS — E1169 Type 2 diabetes mellitus with other specified complication: Secondary | ICD-10-CM

## 2019-08-06 DIAGNOSIS — K219 Gastro-esophageal reflux disease without esophagitis: Secondary | ICD-10-CM

## 2019-08-06 DIAGNOSIS — M7989 Other specified soft tissue disorders: Secondary | ICD-10-CM | POA: Insufficient documentation

## 2019-08-06 MED ORDER — OZEMPIC (1 MG/DOSE) 2 MG/1.5ML ~~LOC~~ SOPN: 1.0000 mg | PEN_INJECTOR | SUBCUTANEOUS | 5 refills | Status: DC

## 2019-08-06 MED ORDER — PANTOPRAZOLE SODIUM 40 MG PO TBEC: 40.0000 mg | DELAYED_RELEASE_TABLET | Freq: Every day | ORAL | 2 refills | Status: DC

## 2019-08-06 NOTE — Progress Notes (Signed)
Subjective:   Chief Complaint  Patient presents with  . knot on left shoulder    Madeline Chandler is a 58 y.o. female here for follow-up of diabetes.   [name] does not routinely monitor her BS's.  Patient does not require insulin.   Medications include: Ozempic 1 mg/week Diet is fair.  Exercise: Some walking  Continues to have a soft tiss mass on collar bone that she is worried about. +hx of breast cancer. Might has changed. US showed extension of [place] jt capsule. Feels it moves, questions that dx. No pain or drainage.   Past Medical History:  Diagnosis Date  . Anxiety   . Arthritis    "hips" (10/28/2016)  . Breast cancer, right breast ([facility]) 1997; 2018   S/P chemo & radiation;   . Bulging lumbar disc    2  . Depression   . Diabetes mellitus without complication ([facility])   . GERD (gastroesophageal reflux disease)   . History of colonic polyps    Dr Collene Mares  . Hypertension   . Migraine    "none in the last few years; had them mostly in late 20's/mid 30's" (10/28/2016)  . OSA (obstructive sleep apnea) 02/19/2018  . Restless leg syndrome      Related testing: Date of retinal exam: Done Pneumovax: done Flu Shot: done  Review of Systems: Pulmonary:  No SOB Cardiovascular:  No chest pain  Objective:  BP 110/74 (BP Location: Left Arm, Patient Position: Sitting, Cuff Size: Normal)   Pulse 85   Temp (!) 97.1 F (36.2 C) (Temporal)   Ht 5' 5.5" (1.664 m)   Wt 196 lb (88.9 kg)   SpO2 99%   BMI 32.12 kg/m  General:  Well developed, well nourished, in no apparent distress Skin:  Warm, no pallor or diaphoresis Head:  Normocephalic, atraumatic Eyes:  Pupils equal and round, sclera anicteric without injection  Lungs:  CTAB, no access msc use Cardio:  RRR, no bruits, no LE edema Musculoskeletal:  Symmetrical muscle groups noted without atrophy or deformity Neuro:  Sensation intact to pinprick on feet Psych: Age appropriate judgment and insight  Assessment:   Soft tissue mass -  Plan: CT SOFT TISSUE NECK W CONTRAST  Diabetes mellitus type 2 in obese (HCC) - Plan: HgB A1c, Comp Met (CMET)  Obesity (BMI 30-39.9) - Plan: Semaglutide, 1 MG/DOSE, (OZEMPIC, 1 MG/DOSE,) 2 MG/1.5ML SOPN  Gastroesophageal reflux disease - Plan: pantoprazole (PROTONIX) 40 MG tablet   Plan:   1- Ck CT w contrast.  2- ck labs.  3- Cont Ozempic. She has done excellent w weight loss. 4- refill PPI.  F/u in 6 mo for CPE The patient voiced understanding and agreement to the plan.  [place], DO 08/06/19 4:33 PM

## 2019-08-06 NOTE — Telephone Encounter (Signed)
Copied from [place] [id number]. Topic: General - Other >> Aug 06, 2019  8:48 AM Keene Breath wrote: Reason for CRM: Patient would like an appt. For knot on her collarbone.  Tried office 2x and no answer.  CB# [phone]   Called him and scheduled appt today 08/06/2019 at 3:30.

## 2019-08-06 NOTE — Patient Instructions (Addendum)
Give [place] 2-3 business days to get the results of your labs back.   I hope to get you in to get a CT scan prior to the new year. They will reach out to schedule this, hopefully for tomorrow.  Let us know if you need anything.

## 2019-08-07 ENCOUNTER — Other Ambulatory Visit (INDEPENDENT_AMBULATORY_CARE_PROVIDER_SITE_OTHER): Payer: 59

## 2019-08-07 ENCOUNTER — Other Ambulatory Visit: Payer: Self-pay

## 2019-08-07 ENCOUNTER — Other Ambulatory Visit: Payer: Self-pay | Admitting: Family Medicine

## 2019-08-07 DIAGNOSIS — R748 Abnormal levels of other serum enzymes: Secondary | ICD-10-CM

## 2019-08-07 DIAGNOSIS — E669 Obesity, unspecified: Secondary | ICD-10-CM | POA: Diagnosis not present

## 2019-08-07 DIAGNOSIS — E1169 Type 2 diabetes mellitus with other specified complication: Secondary | ICD-10-CM | POA: Diagnosis not present

## 2019-08-07 LAB — COMPREHENSIVE METABOLIC PANEL
ALT: 15 U/L (ref 0–35)
AST: 16 U/L (ref 0–37)
Albumin: 4 g/dL (ref 3.5–5.2)
Alkaline Phosphatase: 126 U/L — ABNORMAL HIGH (ref 39–117)
BUN: 18 mg/dL (ref 6–23)
CO2: 30 mEq/L (ref 19–32)
Calcium: 10.2 mg/dL (ref 8.4–10.5)
Chloride: 100 mEq/L (ref 96–112)
Creatinine, Ser: 0.86 mg/dL (ref 0.40–1.20)
GFR: 81.59 mL/min (ref >60.00)
Glucose, Bld: 97 mg/dL (ref 70–99)
Potassium: 3.7 mEq/L (ref 3.5–5.1)
Sodium: 137 mEq/L (ref 135–145)
Total Bilirubin: 0.8 mg/dL (ref 0.2–1.2)
Total Protein: 7.2 g/dL (ref 6.0–8.3)

## 2019-08-07 LAB — HEMOGLOBIN A1C: Hgb A1c MFr Bld: 6.3 % (ref 4.6–6.5)

## 2019-08-07 NOTE — Addendum Note (Signed)
Addended by: Ronny Flurry on: 08/07/2019 08:37 AM   Modules accepted: Orders

## 2019-08-12 ENCOUNTER — Other Ambulatory Visit: Payer: Self-pay | Admitting: Family Medicine

## 2019-08-19 ENCOUNTER — Other Ambulatory Visit: Payer: Self-pay | Admitting: Family Medicine

## 2019-08-21 ENCOUNTER — Other Ambulatory Visit: Payer: Self-pay

## 2019-08-21 ENCOUNTER — Other Ambulatory Visit (INDEPENDENT_AMBULATORY_CARE_PROVIDER_SITE_OTHER): Payer: 59

## 2019-08-21 DIAGNOSIS — R748 Abnormal levels of other serum enzymes: Secondary | ICD-10-CM

## 2019-08-21 LAB — HEPATIC FUNCTION PANEL
ALT: 17 U/L (ref 0–35)
AST: 17 U/L (ref 0–37)
Albumin: 4.1 g/dL (ref 3.5–5.2)
Alkaline Phosphatase: 126 U/L — ABNORMAL HIGH (ref 39–117)
Bilirubin, Direct: 0.1 mg/dL (ref 0.0–0.3)
Total Bilirubin: 0.6 mg/dL (ref 0.2–1.2)
Total Protein: 7.3 g/dL (ref 6.0–8.3)

## 2019-08-22 ENCOUNTER — Other Ambulatory Visit (INDEPENDENT_AMBULATORY_CARE_PROVIDER_SITE_OTHER): Payer: 59

## 2019-08-22 DIAGNOSIS — R748 Abnormal levels of other serum enzymes: Secondary | ICD-10-CM

## 2019-08-22 LAB — GAMMA GT: GGT: 28 U/L (ref 7–51)

## 2019-08-24 ENCOUNTER — Other Ambulatory Visit: Payer: Self-pay | Admitting: Family Medicine

## 2019-08-24 DIAGNOSIS — F411 Generalized anxiety disorder: Secondary | ICD-10-CM

## 2019-08-26 ENCOUNTER — Inpatient Hospital Stay: Payer: 59 | Attending: Oncology | Admitting: Oncology

## 2019-08-26 ENCOUNTER — Other Ambulatory Visit: Payer: Self-pay

## 2019-08-26 VITALS — BP 138/79 | HR 86 | Temp 97.8°F | Resp 17 | Ht 65.5 in | Wt 194.9 lb

## 2019-08-26 DIAGNOSIS — C50211 Malignant neoplasm of upper-inner quadrant of right female breast: Secondary | ICD-10-CM | POA: Diagnosis not present

## 2019-08-26 DIAGNOSIS — Z9882 Breast implant status: Secondary | ICD-10-CM | POA: Diagnosis not present

## 2019-08-26 DIAGNOSIS — R600 Localized edema: Secondary | ICD-10-CM | POA: Diagnosis not present

## 2019-08-26 DIAGNOSIS — Z9221 Personal history of antineoplastic chemotherapy: Secondary | ICD-10-CM | POA: Diagnosis not present

## 2019-08-26 DIAGNOSIS — Z170 Estrogen receptor positive status [ER+]: Secondary | ICD-10-CM | POA: Diagnosis not present

## 2019-08-26 DIAGNOSIS — Z923 Personal history of irradiation: Secondary | ICD-10-CM | POA: Insufficient documentation

## 2019-08-26 DIAGNOSIS — R748 Abnormal levels of other serum enzymes: Secondary | ICD-10-CM | POA: Insufficient documentation

## 2019-08-26 DIAGNOSIS — Z9011 Acquired absence of right breast and nipple: Secondary | ICD-10-CM | POA: Insufficient documentation

## 2019-08-26 DIAGNOSIS — Z79811 Long term (current) use of aromatase inhibitors: Secondary | ICD-10-CM | POA: Insufficient documentation

## 2019-08-26 NOTE — Progress Notes (Signed)
  [facility] OFFICE PROGRESS NOTE   Diagnosis: Breast cancer  INTERVAL HISTORY:   Ms. Kirkes returns as scheduled.  She continues letrozole.  She does not have significant hot flashes.  She reports stiffness in the joints.  She fell in her bathroom August and suffered distal tibia proximal fibula fractures.  She underwent surgical repair.  She reports swelling in the left lower leg since surgery.  No change at the left breast or right chest wall.  She developed a firm fullness at the left sternoclavicular area.  This was evaluated with and ultrasound and found to be consistent with arthritic change at the left sternoclavicular joint. She reports intentional weight loss with semaglutide. Objective:  Vital signs in last 24 hours:  Blood pressure 138/79, pulse 86, temperature 97.8 F (36.6 C), temperature source Temporal, resp. rate 17, height 5' 5.5" (1.664 m), weight 194 lb 14.4 oz (88.4 kg), SpO2 99 %.    HEENT: Neck without mass Lymphatics: No cervical, supraclavicular, or axillary nodes Musculoskeletal: Firm fullness at the left supraclavicular head GI: No hepatosplenomegaly Vascular: Trace edema at the left lower leg Rest: Status post right mastectomy with a TRAM reconstruction, no evidence for chest wall tumor recurrence.  Left breast without mass.   Lab Results:  Lab Results  Component Value Date   WBC 5.9 03/28/2019   HGB 12.0 03/28/2019   HCT 37.9 03/28/2019   MCV 90.0 03/28/2019   PLT 368 03/28/2019   NEUTROABS 6.4 03/09/2019    CMP  Lab Results  Component Value Date   NA 137 08/07/2019   K 3.7 08/07/2019   CL 100 08/07/2019   CO2 30 08/07/2019   GLUCOSE 97 08/07/2019   BUN 18 08/07/2019   CREATININE 0.86 08/07/2019   CALCIUM 10.2 08/07/2019   PROT 7.3 08/21/2019   ALBUMIN 4.1 08/21/2019   AST 17 08/21/2019   ALT 17 08/21/2019   ALKPHOS 150 (H) 08/22/2019   BILITOT 0.6 08/21/2019   GFRNONAA 36 (L) 03/28/2019   GFRAA 41 (L) 03/28/2019     Medications: I have reviewed the patient's current medications.   Assessment/Plan: 1. Stage II (T2 N0) sees right-sided breast cancer diagnosed in September 1997, status post a lumpectomy, sentinel lymph node biopsy, and right axillary dissectionand radiation followed by adjuvant chemotherapy. She completed 5 years of tamoxifen.  2. 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 Hysterectomy and bilateral oophorectomy April 2008  4. Right breast implant and left breast reduction 03/07/2017   Disposition: Ms. Sans is in clinical remission from breast cancer.  She will continue letrozole.  I encouraged her to take calcium and vitamin D while on letrozole.  We will remind her to schedule a left breast mammogram.  Ms. Dilley will continue follow-up with orthopedics for the swelling and discomfort at the left lower leg.  She will return for an office visit in 1 year.  She is undergoing evaluation by Dr. Nani Ravens for a mildly elevated alkaline phosphatase.  Betsy Coder, MD  08/26/2019  4:24 PM

## 2019-08-27 ENCOUNTER — Ambulatory Visit: Payer: 59 | Admitting: Oncology

## 2019-08-27 ENCOUNTER — Telehealth: Payer: Self-pay | Admitting: Family Medicine

## 2019-08-27 ENCOUNTER — Telehealth: Payer: Self-pay | Admitting: Oncology

## 2019-08-27 DIAGNOSIS — E669 Obesity, unspecified: Secondary | ICD-10-CM

## 2019-08-27 LAB — ALKALINE PHOSPHATASE, ISOENZYMES
Alkaline Phosphatase: 150 IU/L — ABNORMAL HIGH (ref 39–117)
BONE FRACTION: 36 % (ref 14–68)
INTESTINAL FRAC.: 4 % (ref 0–18)
LIVER FRACTION: 60 % (ref 18–85)

## 2019-08-27 MED ORDER — OZEMPIC (1 MG/DOSE) 2 MG/1.5ML ~~LOC~~ SOPN: 1.0000 mg | PEN_INJECTOR | SUBCUTANEOUS | 5 refills | Status: DC

## 2019-08-27 NOTE — Telephone Encounter (Signed)
Refill done.  

## 2019-08-27 NOTE — Telephone Encounter (Signed)
Scheduled per los. Called and left msg. Mailed printout  °

## 2019-08-27 NOTE — Telephone Encounter (Signed)
Medication:Semaglutide, 1 MG/DOSE, (OZEMPIC, 1 MG/DOSE,) 2 MG/1.5ML SOPN  Has the patient contacted their pharmacy? Yes.   (If no, request that the patient contact the pharmacy for the refill.) (If yes, when and what did the pharmacy advise?)  Preferred Pharmacy (with phone number or street name): [facility] ( [place]   Agent: Please be advised that RX refills may take up to 3-7 business days. We ask that you follow-up with your pharmacy.  Pt stated that pharmacy refused her last refill stating she needed a new preauth for 2021.

## 2019-09-02 ENCOUNTER — Telehealth: Payer: Self-pay | Admitting: Family Medicine

## 2019-09-02 NOTE — Telephone Encounter (Signed)
PA initiated via Covermymeds; KEY: [id number]. Awaiting determination.

## 2019-09-02 NOTE — Telephone Encounter (Signed)
Called informed the patient medication approved.

## 2019-09-02 NOTE — Telephone Encounter (Signed)
Are you able to go on cover my meds for the PA for this medication? I have not received anything and talked to the patient to have the pharmacy fax [place] over something

## 2019-09-02 NOTE — Telephone Encounter (Signed)
PA approved through 09/01/2020.

## 2019-09-02 NOTE — Telephone Encounter (Signed)
PA approved through [place] my meds Called to inform the patient//left message to call back.

## 2019-09-02 NOTE — Telephone Encounter (Signed)
Called the patient and she will let her pharmacy know to send [place] info on the PA

## 2019-09-02 NOTE — Telephone Encounter (Signed)
Pt calling states pharmacy stated they were waiting on a preauth from wending for her Ozepic. She asked that you give her a call back

## 2019-09-20 ENCOUNTER — Other Ambulatory Visit: Payer: Self-pay

## 2019-09-20 ENCOUNTER — Telehealth: Payer: Self-pay

## 2019-09-20 DIAGNOSIS — F411 Generalized anxiety disorder: Secondary | ICD-10-CM

## 2019-09-20 MED ORDER — DULOXETINE HCL 30 MG PO CPEP: 30.0000 mg | ORAL_CAPSULE | Freq: Every day | ORAL | 1 refills | Status: DC

## 2019-09-20 NOTE — Telephone Encounter (Signed)
Prescription cancelled at [facility] and sent to [facility] per patient's request

## 2019-09-20 NOTE — Telephone Encounter (Signed)
Patient called in to see if Dr. Nani Ravens can send in a new prescription    DULoxetine (CYMBALTA) 30 MG capsule [id number]     Please send it to the patient new pharmacy at [facility]   [place], [place], [place] [address]  Thanks,

## 2019-09-30 IMAGING — DX PORTABLE CHEST - 1 VIEW
1 series · 1 of 1 positions shown · non-contrast
Comparison: 03/09/2019.

CLINICAL DATA: Syncopal episode resulting in a fall.

EXAM:
PORTABLE CHEST 1 VIEW

[chest ap]
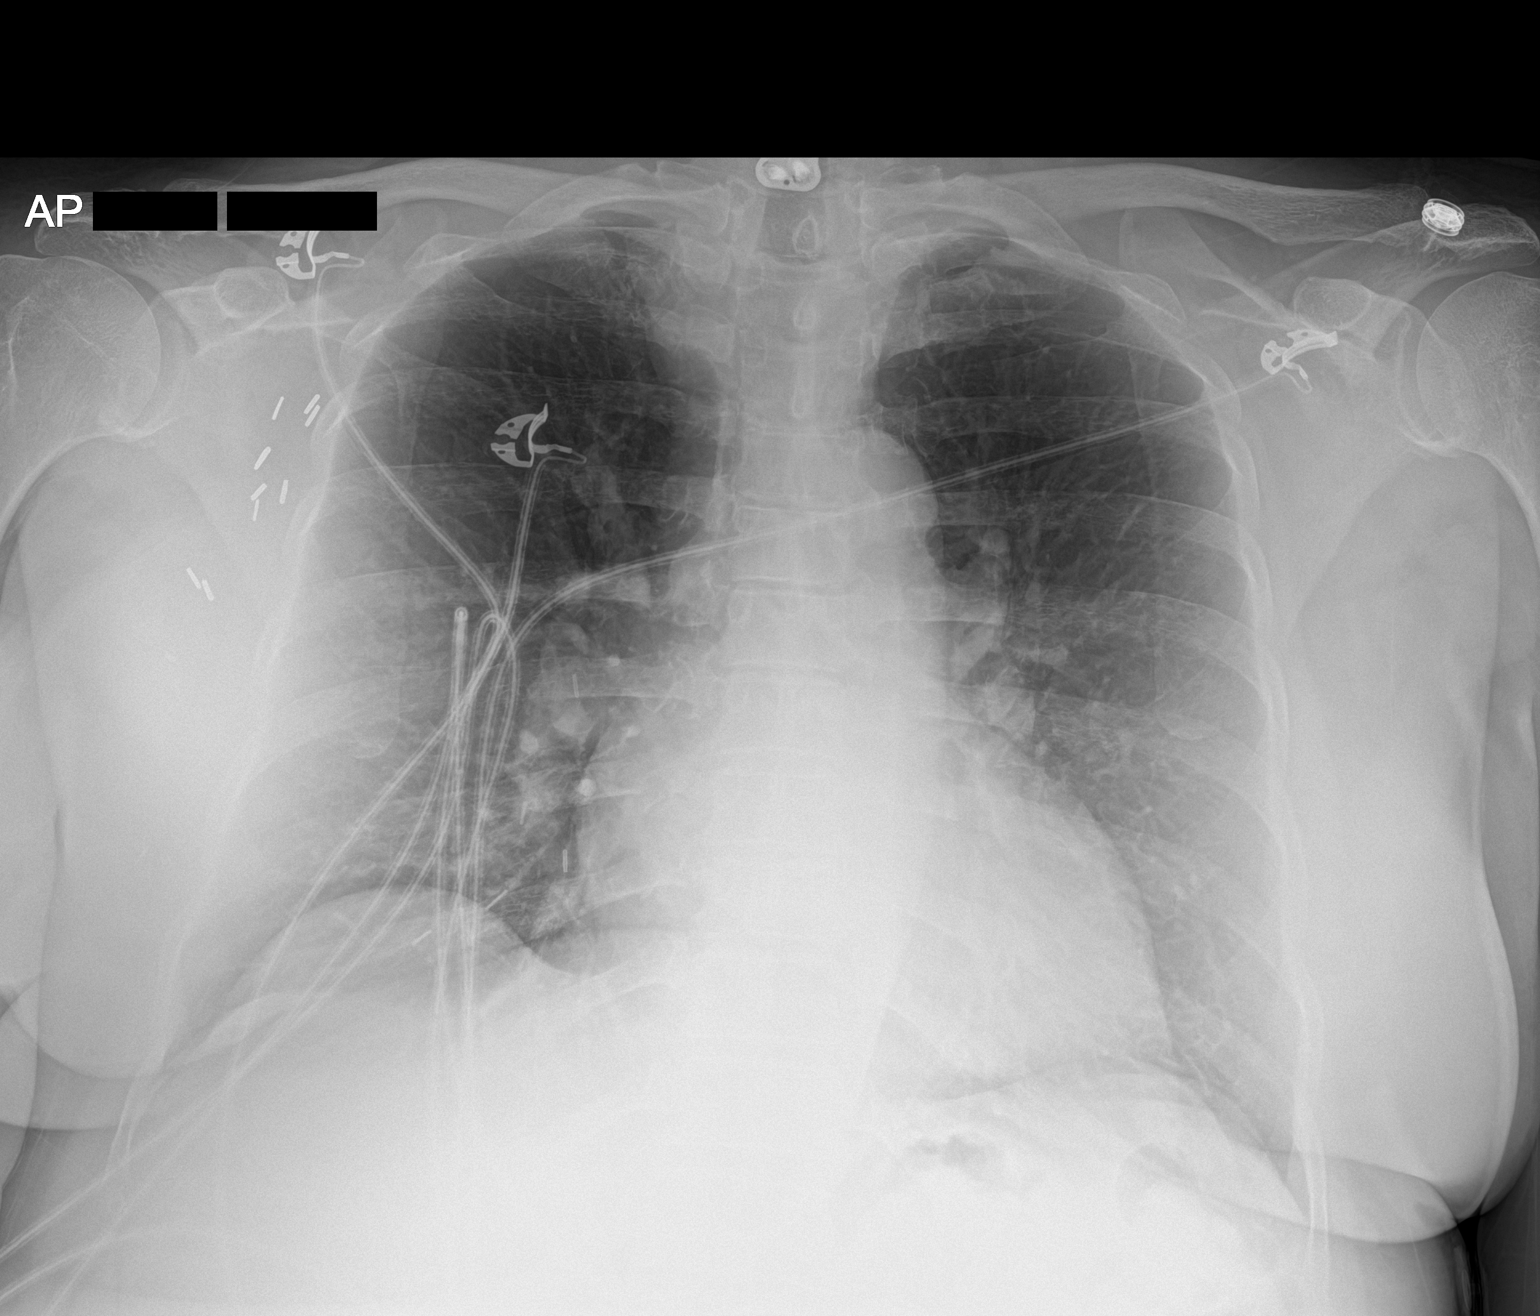

[1 of 1 positions shown; findings below may reference images not displayed]

FINDINGS: The cardiac silhouette remains near the upper limit of normal in
size. Mildly tortuous aorta. Clear lungs with normal vascularity.
Stable surgical clips on the right, including the axilla. Cervical
spine fixation hardware.
IMPRESSION: No acute abnormality.

## 2019-10-03 ENCOUNTER — Other Ambulatory Visit: Payer: Self-pay

## 2019-10-03 ENCOUNTER — Other Ambulatory Visit: Payer: Self-pay | Admitting: Orthopaedic Surgery

## 2019-10-03 DIAGNOSIS — T84498A Other mechanical complication of other internal orthopedic devices, implants and grafts, initial encounter: Secondary | ICD-10-CM

## 2019-10-14 ENCOUNTER — Ambulatory Visit
Admission: RE | Admit: 2019-10-14 | Discharge: 2019-10-14 | Disposition: A | Discharge status: Home / Self Care | Payer: 59 | Source: Ambulatory Visit | Attending: Orthopaedic Surgery | Admitting: Orthopaedic Surgery

## 2019-10-14 ENCOUNTER — Other Ambulatory Visit: Payer: Self-pay

## 2019-10-14 DIAGNOSIS — T84498A Other mechanical complication of other internal orthopedic devices, implants and grafts, initial encounter: Secondary | ICD-10-CM

## 2019-10-22 ENCOUNTER — Other Ambulatory Visit: Payer: Self-pay | Admitting: Orthopaedic Surgery

## 2019-10-24 ENCOUNTER — Encounter (HOSPITAL_BASED_OUTPATIENT_CLINIC_OR_DEPARTMENT_OTHER): Payer: Self-pay | Admitting: Orthopaedic Surgery

## 2019-10-24 ENCOUNTER — Other Ambulatory Visit: Payer: Self-pay

## 2019-10-26 ENCOUNTER — Other Ambulatory Visit (HOSPITAL_COMMUNITY): Payer: 59

## 2019-10-28 ENCOUNTER — Encounter (HOSPITAL_BASED_OUTPATIENT_CLINIC_OR_DEPARTMENT_OTHER)
Admission: RE | Admit: 2019-10-28 | Discharge: 2019-10-28 | Disposition: A | Discharge status: Home / Self Care | Payer: 59 | Source: Ambulatory Visit | Attending: Orthopaedic Surgery | Admitting: Orthopaedic Surgery

## 2019-10-28 ENCOUNTER — Other Ambulatory Visit (HOSPITAL_COMMUNITY)
Admission: RE | Admit: 2019-10-28 | Discharge: 2019-10-28 | Disposition: A | Discharge status: Home / Self Care | Payer: 59 | Source: Ambulatory Visit | Attending: Orthopaedic Surgery | Admitting: Orthopaedic Surgery

## 2019-10-28 DIAGNOSIS — Z20822 Contact with and (suspected) exposure to covid-19: Secondary | ICD-10-CM | POA: Diagnosis not present

## 2019-10-28 DIAGNOSIS — Z01812 Encounter for preprocedural laboratory examination: Secondary | ICD-10-CM | POA: Diagnosis not present

## 2019-10-28 LAB — BASIC METABOLIC PANEL
Anion gap: 7 (ref 5–15)
BUN: 16 mg/dL (ref 6–20)
CO2: 29 mmol/L (ref 22–32)
Calcium: 10.2 mg/dL (ref 8.9–10.3)
Chloride: 102 mmol/L (ref 98–111)
Creatinine, Ser: 0.9 mg/dL (ref 0.44–1.00)
GFR calc Af Amer: >60 mL/min (ref >60)
GFR calc non Af Amer: >60 mL/min (ref >60)
Glucose, Bld: 105 mg/dL — ABNORMAL HIGH (ref 70–99)
Potassium: 4.4 mmol/L (ref 3.5–5.1)
Sodium: 138 mmol/L (ref 135–145)

## 2019-10-28 LAB — SARS CORONAVIRUS 2 (TAT 6-24 HRS): SARS Coronavirus 2: NEGATIVE

## 2019-10-28 NOTE — Progress Notes (Signed)

## 2019-10-31 ENCOUNTER — Ambulatory Visit (HOSPITAL_BASED_OUTPATIENT_CLINIC_OR_DEPARTMENT_OTHER): Payer: 59 | Admitting: Certified Registered"

## 2019-10-31 ENCOUNTER — Ambulatory Visit (HOSPITAL_BASED_OUTPATIENT_CLINIC_OR_DEPARTMENT_OTHER)
Admission: RE | Admit: 2019-10-31 | Discharge: 2019-10-31 | Disposition: A | Discharge status: Home / Self Care | Payer: 59 | Attending: Orthopaedic Surgery | Admitting: Orthopaedic Surgery

## 2019-10-31 ENCOUNTER — Other Ambulatory Visit: Payer: Self-pay

## 2019-10-31 ENCOUNTER — Encounter (HOSPITAL_BASED_OUTPATIENT_CLINIC_OR_DEPARTMENT_OTHER): Payer: Self-pay | Admitting: Orthopaedic Surgery

## 2019-10-31 ENCOUNTER — Encounter (HOSPITAL_BASED_OUTPATIENT_CLINIC_OR_DEPARTMENT_OTHER)
Admission: RE | Disposition: A | Discharge status: Home / Self Care | Payer: Self-pay | Source: Home / Self Care | Attending: Orthopaedic Surgery

## 2019-10-31 DIAGNOSIS — F329 Major depressive disorder, single episode, unspecified: Secondary | ICD-10-CM | POA: Diagnosis not present

## 2019-10-31 DIAGNOSIS — K219 Gastro-esophageal reflux disease without esophagitis: Secondary | ICD-10-CM | POA: Diagnosis not present

## 2019-10-31 DIAGNOSIS — Z9011 Acquired absence of right breast and nipple: Secondary | ICD-10-CM | POA: Diagnosis not present

## 2019-10-31 DIAGNOSIS — Z833 Family history of diabetes mellitus: Secondary | ICD-10-CM | POA: Insufficient documentation

## 2019-10-31 DIAGNOSIS — G4733 Obstructive sleep apnea (adult) (pediatric): Secondary | ICD-10-CM | POA: Diagnosis not present

## 2019-10-31 DIAGNOSIS — Z96643 Presence of artificial hip joint, bilateral: Secondary | ICD-10-CM | POA: Insufficient documentation

## 2019-10-31 DIAGNOSIS — Z888 Allergy status to other drugs, medicaments and biological substances status: Secondary | ICD-10-CM | POA: Diagnosis not present

## 2019-10-31 DIAGNOSIS — E119 Type 2 diabetes mellitus without complications: Secondary | ICD-10-CM | POA: Insufficient documentation

## 2019-10-31 DIAGNOSIS — I10 Essential (primary) hypertension: Secondary | ICD-10-CM | POA: Diagnosis not present

## 2019-10-31 DIAGNOSIS — F419 Anxiety disorder, unspecified: Secondary | ICD-10-CM | POA: Diagnosis not present

## 2019-10-31 DIAGNOSIS — Z8249 Family history of ischemic heart disease and other diseases of the circulatory system: Secondary | ICD-10-CM | POA: Insufficient documentation

## 2019-10-31 DIAGNOSIS — Z79899 Other long term (current) drug therapy: Secondary | ICD-10-CM | POA: Insufficient documentation

## 2019-10-31 DIAGNOSIS — M1990 Unspecified osteoarthritis, unspecified site: Secondary | ICD-10-CM | POA: Insufficient documentation

## 2019-10-31 DIAGNOSIS — Z464 Encounter for fitting and adjustment of orthodontic device: Secondary | ICD-10-CM | POA: Diagnosis present

## 2019-10-31 DIAGNOSIS — G2581 Restless legs syndrome: Secondary | ICD-10-CM | POA: Diagnosis not present

## 2019-10-31 HISTORY — PX: HARDWARE REMOVAL: SHX979

## 2019-10-31 LAB — GLUCOSE, CAPILLARY
Glucose-Capillary: 98 mg/dL (ref 70–99)
Glucose-Capillary: 86 mg/dL (ref 70–99)

## 2019-10-31 SURGERY — REMOVAL, HARDWARE
Anesthesia: General | Site: Leg Lower | Laterality: Left

## 2019-10-31 MED ORDER — CEFAZOLIN SODIUM-DEXTROSE 2-4 GM/100ML-% IV SOLN
2.0000 g | INTRAVENOUS | Status: AC
  Administered 2019-10-31: 2 g via INTRAVENOUS

## 2019-10-31 MED ORDER — BUPIVACAINE HCL (PF) 0.25 % IJ SOLN
INTRAMUSCULAR | Status: AC
  Filled 2019-10-31: qty 30

## 2019-10-31 MED ORDER — BUPIVACAINE HCL 0.5 % IJ SOLN
INTRAMUSCULAR | Status: DC | PRN
  Administered 2019-10-31: 20 mL

## 2019-10-31 MED ORDER — EPHEDRINE SULFATE 50 MG/ML IJ SOLN
INTRAMUSCULAR | Status: DC | PRN
  Administered 2019-10-31 (×2): 15 mg via INTRAVENOUS

## 2019-10-31 MED ORDER — ACETAMINOPHEN 500 MG PO TABS: 1000.0000 mg | ORAL_TABLET | Freq: Once | ORAL | Status: DC

## 2019-10-31 MED ORDER — 0.9 % SODIUM CHLORIDE (POUR BTL) OPTIME
TOPICAL | Status: DC | PRN
  Administered 2019-10-31: 1000 mL

## 2019-10-31 MED ORDER — ACETAMINOPHEN 10 MG/ML IV SOLN
INTRAVENOUS | Status: DC | PRN
  Administered 2019-10-31: 1000 mg via INTRAVENOUS

## 2019-10-31 MED ORDER — PROPOFOL 10 MG/ML IV BOLUS
INTRAVENOUS | Status: AC
  Filled 2019-10-31: qty 20

## 2019-10-31 MED ORDER — GLYCOPYRROLATE PF 0.2 MG/ML IJ SOSY
PREFILLED_SYRINGE | INTRAMUSCULAR | Status: AC
  Filled 2019-10-31: qty 1

## 2019-10-31 MED ORDER — CEFAZOLIN SODIUM-DEXTROSE 2-4 GM/100ML-% IV SOLN
INTRAVENOUS | Status: AC
  Filled 2019-10-31: qty 100

## 2019-10-31 MED ORDER — FENTANYL CITRATE (PF) 100 MCG/2ML IJ SOLN
50.0000 ug | INTRAMUSCULAR | Status: DC | PRN
  Administered 2019-10-31: 50 ug via INTRAVENOUS

## 2019-10-31 MED ORDER — PHENYLEPHRINE HCL (PRESSORS) 10 MG/ML IV SOLN
INTRAVENOUS | Status: DC | PRN
  Administered 2019-10-31: 120 ug via INTRAVENOUS
  Administered 2019-10-31: 80 ug via INTRAVENOUS
  Administered 2019-10-31: 120 ug via INTRAVENOUS
  Administered 2019-10-31 (×2): 80 ug via INTRAVENOUS
  Administered 2019-10-31: 120 ug via INTRAVENOUS

## 2019-10-31 MED ORDER — GLYCOPYRROLATE 0.2 MG/ML IJ SOLN
INTRAMUSCULAR | Status: DC | PRN
  Administered 2019-10-31: .2 mg via INTRAVENOUS

## 2019-10-31 MED ORDER — POVIDONE-IODINE 10 % EX SWAB: 2.0000 "application " | Freq: Once | CUTANEOUS | Status: DC

## 2019-10-31 MED ORDER — ONDANSETRON HCL 4 MG/2ML IJ SOLN
INTRAMUSCULAR | Status: AC
  Filled 2019-10-31: qty 2

## 2019-10-31 MED ORDER — OXYCODONE HCL 5 MG PO TABS
5.0000 mg | ORAL_TABLET | Freq: Once | ORAL | Status: AC
  Administered 2019-10-31: 5 mg via ORAL

## 2019-10-31 MED ORDER — LIDOCAINE 2% (20 MG/ML) 5 ML SYRINGE
INTRAMUSCULAR | Status: AC
  Filled 2019-10-31: qty 5

## 2019-10-31 MED ORDER — FENTANYL CITRATE (PF) 100 MCG/2ML IJ SOLN
INTRAMUSCULAR | Status: AC
  Filled 2019-10-31: qty 2

## 2019-10-31 MED ORDER — OXYCODONE HCL 5 MG PO TABS
ORAL_TABLET | ORAL | Status: AC
  Filled 2019-10-31: qty 1

## 2019-10-31 MED ORDER — PROPOFOL 10 MG/ML IV BOLUS
INTRAVENOUS | Status: DC | PRN
  Administered 2019-10-31 (×2): 50 mg via INTRAVENOUS

## 2019-10-31 MED ORDER — ACETAMINOPHEN 10 MG/ML IV SOLN
INTRAVENOUS | Status: AC
  Filled 2019-10-31: qty 100

## 2019-10-31 MED ORDER — HYDROCODONE-ACETAMINOPHEN 5-325 MG PO TABS: 1.0000 | ORAL_TABLET | ORAL | 0 refills | Status: AC | PRN

## 2019-10-31 MED ORDER — EPHEDRINE 5 MG/ML INJ
INTRAVENOUS | Status: AC
  Filled 2019-10-31: qty 10

## 2019-10-31 MED ORDER — FENTANYL CITRATE (PF) 100 MCG/2ML IJ SOLN
25.0000 ug | INTRAMUSCULAR | Status: DC | PRN
  Administered 2019-10-31: 25 ug via INTRAVENOUS

## 2019-10-31 MED ORDER — LACTATED RINGERS IV SOLN: INTRAVENOUS | Status: DC

## 2019-10-31 MED ORDER — MIDAZOLAM HCL 2 MG/2ML IJ SOLN
1.0000 mg | INTRAMUSCULAR | Status: DC | PRN
  Administered 2019-10-31: 2 mg via INTRAVENOUS

## 2019-10-31 MED ORDER — ONDANSETRON HCL 4 MG/2ML IJ SOLN
INTRAMUSCULAR | Status: DC | PRN
  Administered 2019-10-31: 4 mg via INTRAVENOUS

## 2019-10-31 MED ORDER — MIDAZOLAM HCL 2 MG/2ML IJ SOLN
INTRAMUSCULAR | Status: AC
  Filled 2019-10-31: qty 2

## 2019-10-31 MED ORDER — LIDOCAINE HCL (CARDIAC) PF 100 MG/5ML IV SOSY
PREFILLED_SYRINGE | INTRAVENOUS | Status: DC | PRN
  Administered 2019-10-31: 50 mg via INTRAVENOUS

## 2019-10-31 SURGICAL SUPPLY — 59 items
BANDAGE ESMARK 6X9 LF (GAUZE/BANDAGES/DRESSINGS) IMPLANT
BENZOIN TINCTURE PRP APPL 2/3 (GAUZE/BANDAGES/DRESSINGS) IMPLANT
BLADE SURG 15 STRL LF DISP TIS (BLADE) ×2 IMPLANT
BLADE SURG 15 STRL SS (BLADE) ×4
BNDG ELASTIC 4X5.8 VLCR STR LF (GAUZE/BANDAGES/DRESSINGS) IMPLANT
BNDG ELASTIC 6X5.8 VLCR STR LF (GAUZE/BANDAGES/DRESSINGS) IMPLANT
BNDG ESMARK 4X9 LF (GAUZE/BANDAGES/DRESSINGS) IMPLANT
BNDG ESMARK 6X9 LF (GAUZE/BANDAGES/DRESSINGS)
CHLORAPREP W/TINT 26 (MISCELLANEOUS) ×2 IMPLANT
COVER BACK TABLE 60X90IN (DRAPES) ×2 IMPLANT
COVER WAND RF STERILE (DRAPES) IMPLANT
CUFF TOURN SGL QUICK 34 (TOURNIQUET CUFF)
CUFF TRNQT CYL 34X4.125X (TOURNIQUET CUFF) IMPLANT
DECANTER SPIKE VIAL GLASS SM (MISCELLANEOUS) IMPLANT
DRAPE C-ARM 42X72 X-RAY (DRAPES) IMPLANT
DRAPE EXTREMITY T 121X128X90 (DISPOSABLE) ×2 IMPLANT
DRAPE IMP U-DRAPE 54X76 (DRAPES) ×2 IMPLANT
DRAPE OEC MINIVIEW 54X84 (DRAPES) ×2 IMPLANT
DRAPE U-SHAPE 47X51 STRL (DRAPES) ×2 IMPLANT
DRSG PAD ABDOMINAL 8X10 ST (GAUZE/BANDAGES/DRESSINGS) ×1 IMPLANT
ELECT REM PT RETURN 9FT ADLT (ELECTROSURGICAL) ×2
ELECTRODE REM PT RTRN 9FT ADLT (ELECTROSURGICAL) ×1 IMPLANT
GAUZE SPONGE 4X4 12PLY STRL (GAUZE/BANDAGES/DRESSINGS) ×2 IMPLANT
GAUZE XEROFORM 1X8 LF (GAUZE/BANDAGES/DRESSINGS) ×2 IMPLANT
GLOVE BIOGEL M STRL SZ7.5 (GLOVE) ×2 IMPLANT
GLOVE BIOGEL PI IND STRL 8 (GLOVE) ×1 IMPLANT
GLOVE BIOGEL PI INDICATOR 8 (GLOVE) ×1
GOWN STRL REUS W/ TWL LRG LVL3 (GOWN DISPOSABLE) ×1 IMPLANT
GOWN STRL REUS W/ TWL XL LVL3 (GOWN DISPOSABLE) ×1 IMPLANT
GOWN STRL REUS W/TWL LRG LVL3 (GOWN DISPOSABLE) ×6
GOWN STRL REUS W/TWL XL LVL3 (GOWN DISPOSABLE) ×2
NDL HYPO 25X1 1.5 SAFETY (NEEDLE) IMPLANT
NEEDLE HYPO 25X1 1.5 SAFETY (NEEDLE) ×2 IMPLANT
NS IRRIG 1000ML POUR BTL (IV SOLUTION) ×2 IMPLANT
PACK BASIN DAY SURGERY FS (CUSTOM PROCEDURE TRAY) ×2 IMPLANT
PAD CAST 4YDX4 CTTN HI CHSV (CAST SUPPLIES) ×1 IMPLANT
PADDING CAST COTTON 4X4 STRL (CAST SUPPLIES) ×2
PADDING CAST SYNTHETIC 4 (CAST SUPPLIES)
PADDING CAST SYNTHETIC 4X4 STR (CAST SUPPLIES) IMPLANT
PENCIL SMOKE EVACUATOR (MISCELLANEOUS) ×2 IMPLANT
SHEET MEDIUM DRAPE 40X70 STRL (DRAPES) ×2 IMPLANT
SLEEVE SCD COMPRESS KNEE MED (MISCELLANEOUS) ×2 IMPLANT
SPLINT FAST PLASTER 5X30 (CAST SUPPLIES)
SPLINT PLASTER CAST FAST 5X30 (CAST SUPPLIES) IMPLANT
SPONGE LAP 18X18 RF (DISPOSABLE) ×1 IMPLANT
STOCKINETTE 6  STRL (DRAPES) ×2
STOCKINETTE 6 STRL (DRAPES) ×1 IMPLANT
STRIP CLOSURE SKIN 1/2X4 (GAUZE/BANDAGES/DRESSINGS) IMPLANT
SUCTION FRAZIER HANDLE 10FR (MISCELLANEOUS)
SUCTION TUBE FRAZIER 10FR DISP (MISCELLANEOUS) IMPLANT
SUT ETHILON 3 0 PS 1 (SUTURE) ×1 IMPLANT
SUT MNCRL AB 3-0 PS2 18 (SUTURE) ×2 IMPLANT
SUT PDS AB 2-0 CT2 27 (SUTURE) ×2 IMPLANT
SUT VIC AB 3-0 FS2 27 (SUTURE) IMPLANT
SYR BULB 3OZ (MISCELLANEOUS) ×2 IMPLANT
SYR CONTROL 10ML LL (SYRINGE) ×1 IMPLANT
TOWEL GREEN STERILE FF (TOWEL DISPOSABLE) ×4 IMPLANT
TUBE CONNECTING 20X1/4 (TUBING) IMPLANT
UNDERPAD 30X36 HEAVY ABSORB (UNDERPADS AND DIAPERS) ×2 IMPLANT

## 2019-10-31 NOTE — OR Nursing (Signed)
Two screws removed in OR by Dr Lucia Gaskins.

## 2019-10-31 NOTE — H&P (Signed)
Madeline Chandler is an 59 y.o. female.   Chief Complaint: Left symptomatic orthopedic hardware of distal intramedullary nail of tibia. HPI: Patient underwent intramedullary nailing of her left tibia fracture about 1 year ago.  She had symptomatic orthopedic hardware distally within the nail and was indicated for screw removal.  She denies any knee pain.  Pain is worsened with activities.  She failed conservative treatment.  Shoe is here today for surgical removal of her distal screws.  She denies any fevers or chills.  Past Medical History:  Diagnosis Date  . Anxiety   . Arthritis    "hips" (10/28/2016)  . Breast cancer, right breast ([facility]) 1997; 2018   S/P chemo & radiation;   . Bulging lumbar disc    2  . Depression   . Diabetes mellitus without complication ([facility])   . GERD (gastroesophageal reflux disease)   . History of colonic polyps    Dr Collene Mares  . Hypertension   . Migraine    "none in the last few years; had them mostly in late 20's/mid 30's" (10/28/2016)  . OSA (obstructive sleep apnea) 02/19/2018  . Restless leg syndrome     Past Surgical History:  Procedure Laterality Date  . ABDOMINAL HYSTERECTOMY  2000s  . ANTERIOR CERVICAL DECOMP/DISCECTOMY FUSION N/A 12/02/2015   Procedure: ANTERIOR CERVICAL DECOMPRESSION/DISCECTOMY FUSION C4 - C7  3 LEVELS;  Surgeon: Melina Schools, MD;  Location: [facility];  Service: Orthopedics;  Laterality: N/A;  . BACK SURGERY    . BREAST BIOPSY Right 1997; 08/2016  . BREAST LUMPECTOMY Right 1997  . BREAST RECONSTRUCTION Right 07/07/2017   Procedure: RIGHT NIPPLE AEROLA RECONSTRUCTION WITH LOCAL FLAP FROM RIGHT GROIN;  Surgeon: Irene Limbo, MD;  Location: [facility];  Service: Plastics;  Laterality: Right;  . BREAST REDUCTION SURGERY Left 03/07/2017   Procedure: LEFT BREAST REDUCTION FOR SYMMETRY;  Surgeon: Irene Limbo, MD;  Location: [facility];  Service: Plastics;  Laterality: Left;  . COLONOSCOPY W/ POLYPECTOMY   2010   Dr Collene Mares  . DILATION AND CURETTAGE OF UTERUS  1990s  . JOINT REPLACEMENT    . LATISSIMUS FLAP TO BREAST Right 10/28/2016   Procedure: LATISSIMUS FLAP TO BREAST WITH PLACEMENT OF TISSUE EXPANDER ON THE RIGHT;  Surgeon: Irene Limbo, MD;  Location: [facility];  Service: Plastics;  Laterality: Right;  RFNA  . MASTECTOMY Right 10/28/2016  . RECONSTRUCTION BREAST W/ LATISSIMUS DORSI FLAP Right 10/28/2016    WITH PLACEMENT OF TISSUE EXPANDER   . REMOVAL OF BILATERAL TISSUE EXPANDERS WITH PLACEMENT OF BILATERAL BREAST IMPLANTS Right 03/07/2017   Procedure: REMOVAL OF RIGHT BREAST TISSUE EXPANDER WITH PLACEMENT OF RIGHT BREAST IMPLANT;  Surgeon: Irene Limbo, MD;  Location: [facility];  Service: Plastics;  Laterality: Right;  . TIBIA IM NAIL INSERTION Left 03/09/2019   Procedure: INTRAMEDULLARY (IM) NAIL TIBIAL;  Surgeon: Erle Crocker, MD;  Location: [facility];  Service: Orthopedics;  Laterality: Left;  . TISSUE EXPANDER PLACEMENT Right 10/28/2016   Procedure: TISSUE EXPANDER;  Surgeon: Irene Limbo, MD;  Location: [facility];  Service: Plastics;  Laterality: Right;  . TOTAL HIP ARTHROPLASTY Right 05/12/2015   Procedure: RIGHT TOTAL HIP ARTHROPLASTY ANTERIOR APPROACH;  Surgeon: Paralee Cancel, MD;  Location[facility];  Service: Orthopedics;  Laterality: Right;  . TOTAL HIP ARTHROPLASTY Left 06/23/2015   Procedure: LEFT TOTAL HIP ARTHROPLASTY ANTERIOR APPROACH;  Surgeon: Paralee Cancel, MD;  Location: [facility];  Service: Orthopedics;  Laterality: Left;  . TOTAL MASTECTOMY  Right 10/28/2016   Procedure: RIGHT MASTECTOMY;  Surgeon: Alphonsa Overall, MD;  Location: [facility];  Service: General;  Laterality: Right;    Family History  Problem Relation Age of Onset  . Hypertension Mother   . Diabetes Mother   . Breast cancer Mother 15  . Hypertension Sister   . Hypertension Brother        X 3  . Cancer Maternal Aunt        leukemia  . Breast cancer Paternal Aunt        diagnosed in 61s or 44s   . Cancer Paternal Uncle        unknown type  . Cancer Paternal Aunt        unknown type  . Breast cancer Cousin 51  . Stroke Neg Hx   . Heart disease Neg Hx    Social History:  reports that she has never smoked. She has never used smokeless tobacco. She reports that she does not drink alcohol or use drugs.  Allergies:  Allergies  Allergen Reactions  . Prednisone Other (See Comments)    headaches    Medications Prior to Admission  Medication Sig Dispense Refill  . amphetamine-dextroamphetamine (ADDERALL) 20 MG tablet Take 20 mg by mouth daily.  0  . carvedilol (COREG) 25 MG tablet TAKE 1 TABLET BY MOUTH TWICE A DAY WITH MEALS 180 tablet 1  . DULoxetine (CYMBALTA) 30 MG capsule Take 1 capsule (30 mg total) by mouth daily. 30 capsule 1  . glucose blood (ONETOUCH VERIO) test strip Use once daily to check blood sugar.  DX E11.9 100 each 1  . letrozole (FEMARA) 2.5 MG tablet Take 1 tablet (2.5 mg total) by mouth daily. 90 tablet 3  . losartan-hydrochlorothiazide (HYZAAR) 100-25 MG tablet TAKE 1 TABLET BY MOUTH EVERY DAY 30 tablet 5  . ONETOUCH DELICA LANCETS FINE MISC Use as directed once daily to check blood sugar.  DXE11.9 100 each 1  . pantoprazole (PROTONIX) 40 MG tablet Take 1 tablet (40 mg total) by mouth daily. 30 tablet 2  . Semaglutide, 1 MG/DOSE, (OZEMPIC, 1 MG/DOSE,) 2 MG/1.5ML SOPN Inject 1 mg into the skin once a week. 3 mL 5    Results for orders placed or performed during the hospital encounter of 10/31/19 (from the past 48 hour(s))  Glucose, capillary     Status: None   Collection Time: 10/31/19  6:44 AM  Result Value Ref Range   Glucose-Capillary 98 70 - 99 mg/dL    Comment: Glucose reference range applies only to samples taken after fasting for at least 8 hours.   No results found.  Review of Systems  Constitutional: Negative.   HENT: Negative.   Eyes: Negative.   Respiratory: Negative.   Cardiovascular: Negative.   Gastrointestinal: Negative.    Musculoskeletal:       Left ankle pain  Skin: Negative.   Neurological: Negative.   Psychiatric/Behavioral: Negative.     Blood pressure (!) 111/58, pulse 77, temperature 98.3 F (36.8 C), temperature source Oral, resp. rate 16, height 5' 5.5" (1.664 m), weight 92.9 kg, SpO2 99 %. Physical Exam  Constitutional: She appears well-developed.  HENT:  Head: Normocephalic.  Eyes: Conjunctivae are normal.  Cardiovascular: Normal rate.  Respiratory: Effort normal.  GI: Soft.  Musculoskeletal:     Cervical back: Neck supple.     Comments: Left ankle TTP lateral distal fibula.  She has active ankle dorsiflexion plantarflexion intact.  Some discomfort laterally with motion.  Sensation grossly  intact.  Foot is warm and well-perfused.  Skin: Skin is warm.  Psychiatric: She has a normal mood and affect.     Assessment/Plan We will plan for the to distal transverse screws to be removed.  They are impinging within the fibula.  She understands the risks, benefits alternatives of surgery which include continued pain, wound healing complications, infection, need for further surgery and damage to surrounding structures.  After weighing these risks she would  like to proceed.  Erle Crocker, MD 10/31/2019, 8:03 AM

## 2019-10-31 NOTE — Discharge Instructions (Signed)
DR. Lucia Gaskins FOOT & ANKLE SURGERY POST-OP INSTRUCTIONS   Pain Management 1. The numbing medicine and your leg will last around 5 hours, take a dose of your pain medicine as soon as you feel it wearing off to avoid rebound pain. 2. Keep your foot elevated above heart level.  Make sure that your heel hangs free ('floats'). 3. Take all prescribed medication as directed. 4. If taking narcotic pain medication you may want to use an over-the-counter stool softener to avoid constipation. 5. You may take over-the-counter NSAIDs (ibuprofen, naproxen, etc.) as well as over-the-counter acetaminophen as directed on the packaging as a supplement for your pain and may also use it to wean away from the prescription medication.  Activity ? Weightbearing as tolerated  First Postoperative Visit 1. Your first postop visit will be at least 2 weeks after surgery.  This should be scheduled when you schedule surgery. 2. If you do not have a postoperative visit scheduled please call [phone] to schedule an appointment. 3. At the appointment your incision will be evaluated for suture removal, x-rays will be obtained if necessary.  General Instructions 1. Swelling is very common after foot and ankle surgery.  It often takes 3 months for the foot and ankle to begin to feel comfortable.  Some amount of swelling will persist for 6-12 months. 2. DO NOT change the dressing.  If there is a problem with the dressing (too tight, loose, gets wet, etc.) please contact Dr. Pollie Friar office. 3. DO NOT get the dressing wet.  For showers you can use an over-the-counter cast cover or wrap a washcloth around the top of your dressing and then cover it with a plastic bag and tape it to your leg. 4. DO NOT soak the incision (no tubs, pools, bath, etc.) until you have approval from Dr. Lucia Gaskins.  Contact Dr. Huel Cote office or go to Emergency Room if: 1. Temperature above 101 F. 2. Increasing pain that is unresponsive to pain medication or  elevation 3. Excessive redness or swelling in your foot 4. Dressing problems - excessive bloody drainage, looseness or tightness, or if dressing gets wet 5. Develop pain, swelling, warmth, or discoloration of your calf    NO TYLENOL PRODUCTS UNTIL 2:00 PM  OXYCODONE GIVEN AT 9:30 . NO PAIN MED UNTIL 1:30 PM   Post Anesthesia Home Care Instructions  Activity: Get plenty of rest for the remainder of the day. A responsible individual must stay with you for 24 hours following the procedure.  For the next 24 hours, DO NOT: -Drive a car -Paediatric nurse -Drink alcoholic beverages -Take any medication unless instructed by your physician -Make any legal decisions or sign important papers.  Meals: Start with liquid foods such as gelatin or soup. Progress to regular foods as tolerated. Avoid greasy, spicy, heavy foods. If nausea and/or vomiting occur, drink only clear liquids until the nausea and/or vomiting subsides. Call your physician if vomiting continues.  Special Instructions/Symptoms: Your throat may feel dry or sore from the anesthesia or the breathing tube placed in your throat during surgery. If this causes discomfort, gargle with warm salt water. The discomfort should disappear within 24 hours.  If you had a scopolamine patch placed behind your ear for the management of post- operative nausea and/or vomiting:  1. The medication in the patch is effective for 72 hours, after which it should be removed.  Wrap patch in a tissue and discard in the trash. Wash hands thoroughly with soap and water. 2. You may  remove the patch earlier than 72 hours if you experience unpleasant side effects which may include dry mouth, dizziness or visual disturbances. 3. Avoid touching the patch. Wash your hands with soap and water after contact with the patch.

## 2019-10-31 NOTE — Anesthesia Procedure Notes (Signed)
Procedure Name: LMA Insertion Date/Time: 10/31/2019 7:38 AM Performed by: Bufford Spikes, CRNA Pre-anesthesia Checklist: Patient identified, Emergency Drugs available, Suction available and Patient being monitored Patient Re-evaluated:Patient Re-evaluated prior to induction Oxygen Delivery Method: Circle system utilized Preoxygenation: Pre-oxygenation with 100% oxygen Induction Type: IV induction Ventilation: Mask ventilation without difficulty LMA: LMA inserted LMA Size: 4.0 Number of attempts: 1 Airway Equipment and Method: Bite block Placement Confirmation: positive ETCO2 Tube secured with: Tape Dental Injury: Teeth and Oropharynx as per pre-operative assessment

## 2019-10-31 NOTE — Anesthesia Postprocedure Evaluation (Signed)
Anesthesia Post Note  Patient: Madeline Chandler  Procedure(s) Performed: LEFT LEG MEDIAL HARDWARE REMOVAL (Left Leg Lower)     Patient location during evaluation: PACU Anesthesia Type: General Level of consciousness: awake and alert Pain management: pain level controlled Vital Signs Assessment: post-procedure vital signs reviewed and stable Respiratory status: spontaneous breathing, nonlabored ventilation, respiratory function stable and patient connected to nasal cannula oxygen Cardiovascular status: blood pressure returned to baseline and stable Postop Assessment: no apparent nausea or vomiting Anesthetic complications: no    Last Vitals:  Vitals:   10/31/19 0930 10/31/19 0945  BP: 128/77 122/63  Pulse: 75 73  Resp: 18 18  Temp:  36.5 C  SpO2: 100% 100%    Last Pain:  Vitals:   10/31/19 0945  TempSrc: Oral  PainSc: 6                  Arshi Duarte L Tria Noguera

## 2019-10-31 NOTE — Transfer of Care (Signed)
Immediate Anesthesia Transfer of Care Note  Patient: [name]  Procedure(s) Performed: LEFT LEG MEDIAL HARDWARE REMOVAL (Left Leg Lower)  Patient Location: PACU  Anesthesia Type:General  Level of Consciousness: awake, alert  and oriented  Airway & Oxygen Therapy: Patient Spontanous Breathing and Patient connected to nasal cannula oxygen  Post-op Assessment: Report given to RN and Post -op Vital signs reviewed and stable  Post vital signs: Reviewed and stable  Last Vitals:  Vitals Value Taken Time  BP 108/70 10/31/19 0811  Temp    Pulse 81 10/31/19 0812  Resp 12 10/31/19 0812  SpO2 100 % 10/31/19 0812  Vitals shown include unvalidated device data.  Last Pain:  Vitals:   10/31/19 0639  TempSrc: Oral  PainSc: 4       Patients Stated Pain Goal: 5 ([id number])  Complications: No apparent anesthesia complications

## 2019-10-31 NOTE — Anesthesia Preprocedure Evaluation (Addendum)
Anesthesia Evaluation  Patient identified by MRN, date of birth, ID band Patient awake    Reviewed: Allergy & Precautions, NPO status , Patient's Chart, lab work & pertinent test results, reviewed documented beta blocker date and time   Airway Mallampati: II  TM Distance: >3 FB Neck ROM: Full    Dental no notable dental hx. (+) Teeth Intact, Dental Advisory Given   Pulmonary sleep apnea ,    Pulmonary exam normal breath sounds clear to auscultation       Cardiovascular hypertension, Pt. on home beta blockers and Pt. on medications negative cardio ROS Normal cardiovascular exam Rhythm:Regular Rate:Normal  TTE 04/2019 LVEF normal, valves ok   Neuro/Psych  Headaches, PSYCHIATRIC DISORDERS Anxiety Depression    GI/Hepatic Neg liver ROS, GERD  ,  Endo/Other  negative endocrine ROSdiabetes, Oral Hypoglycemic Agents  Renal/GU negative Renal ROS  negative genitourinary   Musculoskeletal  (+) Arthritis ,   Abdominal   Peds  Hematology negative hematology ROS (+)   Anesthesia Other Findings   Reproductive/Obstetrics                           Anesthesia Physical Anesthesia Plan  ASA: III  Anesthesia Plan: General   Post-op Pain Management:    Induction: Intravenous  PONV Risk Score and Plan: 3 and Treatment may vary due to age or medical condition, Ondansetron, Dexamethasone and Midazolam  Airway Management Planned: LMA  Additional Equipment:   Intra-op Plan:   Post-operative Plan: Extubation in OR  Informed Consent: I have reviewed the patients History and Physical, chart, labs and discussed the procedure including the risks, benefits and alternatives for the proposed anesthesia with the patient or authorized representative who has indicated his/her understanding and acceptance.     Dental advisory given  Plan Discussed with: CRNA  Anesthesia Plan Comments:         Anesthesia  Quick Evaluation

## 2019-10-31 NOTE — Op Note (Signed)
  Madeline Chandler female 59 y.o. 10/31/2019  PreOperative Diagnosis: Symptomatic orthopedic hardware left ankle  PostOperative Diagnosis: Same  PROCEDURE: Deep hardware removal left ankle  SURGEON: Melony Overly, MD  ASSISTANT: Carver Fila, OPA  ANESTHESIA: LMA with local infiltration of anesthetic  FINDINGS: Retained deep orthopedic hardware medial left ankle  IMPLANTS: None  INDICATIONS:58 y.o. female underwent intramedullary nailing of her tibia fracture approximately 1 year ago.  She had symptomatic screws at the distal aspect of the nail.  Given this she was indicated for hardware removal.  She understood the risks and benefits and alternatives to surgery which include but not limited to wound healing complications, infection, need for further surgery, damage surrounding structures and continued pain.  After weighing these risks he would like to proceed.  PROCEDURE:Patient was identified the preoperative holding area.  The left leg was marked myself.  Consent was signed myself and the patient.  Peripheral nerve block was performed by anesthesia.  Patient was taken to the operative suite and placed supine on the operative table.  General anesthesia was induced out difficulty.  Preoperative antibiotics were given.   Bone foam was used.  All bony prominences well-padded.  Surgical timeout was performed.    We began by making a longitudinal incision overlying the area of the prior screw insertion scars.  This taken sharply down through skin subcutaneous tissue.  Then blunt dissection was used to identify the medial distal tibia.  The screw heads were identified in the soft tissue overlying them was removed with a rondure.  Then using a screwdriver both distal transverse screws were removed without difficulty.  Fluoroscopy confirmed adequate hardware removal.  Then the wound was irrigated copiously with normal saline.  The wound was closed in a layered fashion using 3-0  Monocryl and 3-0 nylon.  Soft dressing was placed.  She tolerated procedure well.  All counts were correct at the end the case.  She was awakened from anesthesia and taken recovery in stable condition.  There were no complications.  POST OPERATIVE INSTRUCTIONS: Weightbearing as tolerated left lower extremity Dressing may be removed in 3 to 5 days Follow-up in 2 weeks for wound check and suture removal Call the office with concerns  TOURNIQUET TIME: No tourniquet was used  BLOOD LOSS:  less than 50 mL         DRAINS: none         SPECIMEN: none       COMPLICATIONS:  * No complications entered in OR log *         Disposition: PACU - hemodynamically stable.         Condition: stable

## 2019-11-01 ENCOUNTER — Encounter: Payer: Self-pay | Admitting: *Deleted

## 2019-11-01 NOTE — Addendum Note (Signed)
Addendum  created 11/01/19 1240 by Tawni Millers, CRNA   Charge Capture section accepted

## 2019-11-12 ENCOUNTER — Other Ambulatory Visit: Payer: Self-pay | Admitting: Family Medicine

## 2019-11-12 DIAGNOSIS — K219 Gastro-esophageal reflux disease without esophagitis: Secondary | ICD-10-CM

## 2019-11-22 ENCOUNTER — Other Ambulatory Visit: Payer: Self-pay | Admitting: Family Medicine

## 2019-11-22 DIAGNOSIS — F411 Generalized anxiety disorder: Secondary | ICD-10-CM

## 2019-12-19 ENCOUNTER — Other Ambulatory Visit: Payer: Self-pay | Admitting: Family Medicine

## 2019-12-19 DIAGNOSIS — F411 Generalized anxiety disorder: Secondary | ICD-10-CM

## 2019-12-25 ENCOUNTER — Telehealth: Payer: Self-pay | Admitting: Family Medicine

## 2019-12-25 NOTE — Telephone Encounter (Signed)
Melatonin 3-5 mg nightly as needed. Benadryl very very sparingly. Cognitive behavioral therapy can be contacted at [phone] to schedule an appointment or inquire about cost. Ty.

## 2019-12-25 NOTE — Telephone Encounter (Signed)
Please advise, thanks.

## 2019-12-25 NOTE — Telephone Encounter (Signed)
Caller : Barrett Call Back # [phone]  Patient is requesting Dr Nani Ravens recommend a over the counter sleep aide. Patient states that she is unable to sleep at night, patient states that she is also having night sweats.

## 2019-12-26 NOTE — Telephone Encounter (Signed)
Pt called and informed.

## 2020-01-17 ENCOUNTER — Other Ambulatory Visit: Payer: Self-pay | Admitting: Family Medicine

## 2020-01-17 DIAGNOSIS — F411 Generalized anxiety disorder: Secondary | ICD-10-CM

## 2020-01-18 ENCOUNTER — Other Ambulatory Visit: Payer: Self-pay | Admitting: Family Medicine

## 2020-01-18 DIAGNOSIS — K219 Gastro-esophageal reflux disease without esophagitis: Secondary | ICD-10-CM

## 2020-02-04 ENCOUNTER — Ambulatory Visit (INDEPENDENT_AMBULATORY_CARE_PROVIDER_SITE_OTHER): Payer: 59 | Admitting: Family Medicine

## 2020-02-04 ENCOUNTER — Encounter: Payer: Self-pay | Admitting: Family Medicine

## 2020-02-04 ENCOUNTER — Other Ambulatory Visit: Payer: Self-pay

## 2020-02-04 VITALS — BP 142/80 | HR 93 | Temp 96.0°F | Ht 65.5 in | Wt 208.4 lb

## 2020-02-04 DIAGNOSIS — E669 Obesity, unspecified: Secondary | ICD-10-CM

## 2020-02-04 DIAGNOSIS — R030 Elevated blood-pressure reading, without diagnosis of hypertension: Secondary | ICD-10-CM

## 2020-02-04 DIAGNOSIS — R413 Other amnesia: Secondary | ICD-10-CM | POA: Diagnosis not present

## 2020-02-04 DIAGNOSIS — G4733 Obstructive sleep apnea (adult) (pediatric): Secondary | ICD-10-CM

## 2020-02-04 DIAGNOSIS — G4700 Insomnia, unspecified: Secondary | ICD-10-CM | POA: Diagnosis not present

## 2020-02-04 DIAGNOSIS — E1169 Type 2 diabetes mellitus with other specified complication: Secondary | ICD-10-CM

## 2020-02-04 NOTE — Progress Notes (Signed)
Subjective:   Chief Complaint  Patient presents with  . Follow-up    Madeline Chandler is a 59 y.o. female here for follow-up of diabetes.   [name] does not routinely monitor her sugars.  Patient does not require insulin.   Medications include: Ozempic 1 mg/week Diet is usually healthy overall.  Exercise: little due to ankle  Pt w hx of OSA. She has not been sleeping well. Did not end up getting CPAP supplies.   Still having memory issues. She does have stress in life and sleep is as above. Interested in formal eval.   Past Medical History:  Diagnosis Date  . Anxiety   . Arthritis    "hips" (10/28/2016)  . Breast cancer, right breast ([place]) 1997; 2018   S/P chemo & radiation;   . Bulging lumbar disc    2  . Depression   . Diabetes mellitus without complication ([place])   . GERD (gastroesophageal reflux disease)   . History of colonic polyps    Dr Collene Mares  . Hypertension   . Migraine    "none in the last few years; had them mostly in late 20's/mid 30's" (10/28/2016)  . OSA (obstructive sleep apnea) 02/19/2018  . Restless leg syndrome      Related testing: Date of retinal exam: Done Pneumovax: done  Objective:  BP (!) 142/80 (BP Location: Left Arm, Patient Position: Sitting, Cuff Size: Normal)   Pulse 93   Temp (!) 96 F (35.6 C) (Temporal)   Ht 5' 5.5" (1.664 m)   Wt 208 lb 6 oz (94.5 kg)   SpO2 98%   BMI 34.15 kg/m  General:  Well developed, well nourished, in no apparent distress Skin:  Warm, no pallor or diaphoresis Head:  Normocephalic, atraumatic Eyes:  Pupils equal and round, sclera anicteric without injection  Lungs:  CTAB, no access msc use Cardio:  RRR, no bruits, no LE edema Musculoskeletal:  Symmetrical muscle groups noted without atrophy or deformity Neuro:  Sensation intact to pinprick on feet Psych: Age appropriate judgment and insight  Assessment:   Diabetes mellitus type 2 in obese ([place]) - Plan: Lipid panel, Hemoglobin A1c, Comprehensive metabolic  panel, Microalbumin / creatinine urine ratio  Insomnia, unspecified type  OSA (obstructive sleep apnea) - Plan: Ambulatory referral to Pulmonology  Memory deficit - Plan: Ambulatory referral to Psychology  Elevated blood pressure reading   Plan:   1. Counseled on diet and exercise. Cont Ozempic. 2/3. Melatonin, get in w sleep team. Will avoid hypnotics while untreated OSA.  4. Formal eval, likely related to stress.  5. Monitor BP at home. If >140/90, will let us know. Cont BP meds. She has hx of WCS.  F/u for CPE at convenience. The patient voiced understanding and agreement to the plan.  [place], DO 02/04/20 3:38 PM

## 2020-02-04 NOTE — Patient Instructions (Addendum)
Give [place] 2-3 business days to get the results of your labs back.   Keep the diet clean and stay active.  If you do not hear anything about your referrals in the next 1-2 weeks, call our office and ask for an update.  The new Shingrix vaccine (for shingles) is a 2 shot series. It can make people feel low energy, achy and almost like they have the flu for 48 hours after injection. Please plan accordingly when deciding on when to get this shot. Call our office for a nurse visit appointment to get this. The second shot of the series is less severe regarding the side effects, but it still lasts 48 hours.   Monitor your blood pressures at home. If consistently elevated >140/90, let me know.   Let us know if you need anything.

## 2020-02-05 LAB — LIPID PANEL
Cholesterol: 123 mg/dL (ref 0–200)
HDL: 49.7 mg/dL (ref >39.00)
LDL Cholesterol: 59 mg/dL (ref 0–99)
NonHDL: 73.31
Total CHOL/HDL Ratio: 2
Triglycerides: 73 mg/dL (ref 0.0–149.0)
VLDL: 14.6 mg/dL (ref 0.0–40.0)

## 2020-02-05 LAB — COMPREHENSIVE METABOLIC PANEL
ALT: 33 U/L (ref 0–35)
AST: 27 U/L (ref 0–37)
Albumin: 4.3 g/dL (ref 3.5–5.2)
Alkaline Phosphatase: 137 U/L — ABNORMAL HIGH (ref 39–117)
BUN: 12 mg/dL (ref 6–23)
CO2: 31 mEq/L (ref 19–32)
Calcium: 10.4 mg/dL (ref 8.4–10.5)
Chloride: 99 mEq/L (ref 96–112)
Creatinine, Ser: 0.83 mg/dL (ref 0.40–1.20)
GFR: 84.86 mL/min (ref >60.00)
Glucose, Bld: 94 mg/dL (ref 70–99)
Potassium: 3.5 mEq/L (ref 3.5–5.1)
Sodium: 138 mEq/L (ref 135–145)
Total Bilirubin: 0.6 mg/dL (ref 0.2–1.2)
Total Protein: 7.6 g/dL (ref 6.0–8.3)

## 2020-02-05 LAB — MICROALBUMIN / CREATININE URINE RATIO
Creatinine,U: 187 mg/dL
Microalb Creat Ratio: 0.6 mg/g (ref 0.0–30.0)
Microalb, Ur: 1.1 mg/dL (ref 0.0–1.9)

## 2020-02-05 LAB — HEMOGLOBIN A1C: Hgb A1c MFr Bld: 6.3 % (ref 4.6–6.5)

## 2020-02-15 ENCOUNTER — Other Ambulatory Visit: Payer: Self-pay | Admitting: Family Medicine

## 2020-02-15 ENCOUNTER — Other Ambulatory Visit: Payer: Self-pay | Admitting: Oncology

## 2020-02-15 DIAGNOSIS — K219 Gastro-esophageal reflux disease without esophagitis: Secondary | ICD-10-CM

## 2020-02-15 DIAGNOSIS — I10 Essential (primary) hypertension: Secondary | ICD-10-CM

## 2020-02-15 DIAGNOSIS — Z170 Estrogen receptor positive status [ER+]: Secondary | ICD-10-CM

## 2020-02-15 DIAGNOSIS — F411 Generalized anxiety disorder: Secondary | ICD-10-CM

## 2020-02-17 ENCOUNTER — Other Ambulatory Visit: Payer: Self-pay

## 2020-02-17 NOTE — Progress Notes (Signed)
femara refilled

## 2020-03-12 ENCOUNTER — Other Ambulatory Visit: Payer: Self-pay | Admitting: Family Medicine

## 2020-03-14 ENCOUNTER — Other Ambulatory Visit: Payer: Self-pay | Admitting: Family Medicine

## 2020-03-14 DIAGNOSIS — F411 Generalized anxiety disorder: Secondary | ICD-10-CM

## 2020-03-14 DIAGNOSIS — K219 Gastro-esophageal reflux disease without esophagitis: Secondary | ICD-10-CM

## 2020-03-22 ENCOUNTER — Other Ambulatory Visit: Payer: Self-pay | Admitting: Oncology

## 2020-03-22 DIAGNOSIS — C50211 Malignant neoplasm of upper-inner quadrant of right female breast: Secondary | ICD-10-CM

## 2020-03-22 DIAGNOSIS — Z170 Estrogen receptor positive status [ER+]: Secondary | ICD-10-CM

## 2020-04-06 ENCOUNTER — Telehealth (INDEPENDENT_AMBULATORY_CARE_PROVIDER_SITE_OTHER): Payer: 59 | Admitting: Family Medicine

## 2020-04-06 ENCOUNTER — Encounter: Payer: Self-pay | Admitting: Family Medicine

## 2020-04-06 ENCOUNTER — Other Ambulatory Visit: Payer: Self-pay | Admitting: Family Medicine

## 2020-04-06 ENCOUNTER — Other Ambulatory Visit: Payer: Self-pay

## 2020-04-06 VITALS — Temp 98.0°F

## 2020-04-06 DIAGNOSIS — R197 Diarrhea, unspecified: Secondary | ICD-10-CM | POA: Diagnosis not present

## 2020-04-06 DIAGNOSIS — J0100 Acute maxillary sinusitis, unspecified: Secondary | ICD-10-CM | POA: Diagnosis not present

## 2020-04-06 DIAGNOSIS — R413 Other amnesia: Secondary | ICD-10-CM

## 2020-04-06 MED ORDER — METHYLPREDNISOLONE 4 MG PO TBPK: ORAL_TABLET | ORAL | 0 refills | Status: DC

## 2020-04-06 NOTE — Progress Notes (Signed)
Chief Complaint  Patient presents with  . Sore Throat  . Nasal Congestion  . Cough    Madeline Chandler here for URI complaints. Due to COVID-19 pandemic, we are interacting via telephone. I verified patient's ID using 2 identifiers. Patient agreed to proceed with visit via this method. Patient is at home, I am at office. Patient and I are present for visit.   Duration: 1 week  Associated symptoms: sinus headache, sinus congestion, rhinorrhea, itchy watery eyes, ear fullness, sore throat and cough, diarrhea Denies: sinus pain, ear pain, ear drainage, wheezing, shortness of breath, myalgia and fevers Treatment to date: Pepto bismol, [place] Sick contacts: No  Past Medical History:  Diagnosis Date  . Anxiety   . Arthritis    "hips" (10/28/2016)  . Breast cancer, right breast ([place]) 1997; 2018   S/P chemo & radiation;   . Bulging lumbar disc    2  . Depression   . Diabetes mellitus without complication ([place])   . GERD (gastroesophageal reflux disease)   . History of colonic polyps    Dr Collene Mares  . Hypertension   . Migraine    "none in the last few years; had them mostly in late 20's/mid 30's" (10/28/2016)  . OSA (obstructive sleep apnea) 02/19/2018  . Restless leg syndrome     Temp 98 F (36.7 C) (Oral)  No conversational dyspnea Age appropriate judgment and insight Nml affect and mood  Acute maxillary sinusitis, recurrence not specified - Plan: methylPREDNISolone (MEDROL DOSEPAK) 4 MG TBPK tablet  Diarrhea, unspecified type  Dosepak as above. Needs to get tested for covid. # and website given for [facility]. She will call after hanging up wt me. F/u prn. If starting to experience fevers, shaking, or shortness of breath, seek immediate care. If she tests neg for covid, she will let me know and we will set up a stool culture.  Total time: 12 min Pt voiced understanding and agreement to the plan.  [place], DO 04/06/20 3:30 PM

## 2020-04-13 ENCOUNTER — Other Ambulatory Visit: Payer: Self-pay | Admitting: Family Medicine

## 2020-04-13 DIAGNOSIS — K219 Gastro-esophageal reflux disease without esophagitis: Secondary | ICD-10-CM

## 2020-04-13 DIAGNOSIS — F411 Generalized anxiety disorder: Secondary | ICD-10-CM

## 2020-04-24 ENCOUNTER — Encounter: Payer: Self-pay | Admitting: Psychology

## 2020-04-24 ENCOUNTER — Other Ambulatory Visit: Payer: Self-pay

## 2020-04-24 ENCOUNTER — Ambulatory Visit (INDEPENDENT_AMBULATORY_CARE_PROVIDER_SITE_OTHER): Payer: 59 | Admitting: Psychology

## 2020-04-24 ENCOUNTER — Ambulatory Visit: Payer: 59 | Admitting: Psychology

## 2020-04-24 DIAGNOSIS — R4189 Other symptoms and signs involving cognitive functions and awareness: Secondary | ICD-10-CM

## 2020-04-24 DIAGNOSIS — G3184 Mild cognitive impairment, so stated: Secondary | ICD-10-CM | POA: Insufficient documentation

## 2020-04-24 DIAGNOSIS — G4733 Obstructive sleep apnea (adult) (pediatric): Secondary | ICD-10-CM | POA: Diagnosis not present

## 2020-04-24 DIAGNOSIS — F331 Major depressive disorder, recurrent, moderate: Secondary | ICD-10-CM | POA: Diagnosis not present

## 2020-04-24 DIAGNOSIS — Z923 Personal history of irradiation: Secondary | ICD-10-CM | POA: Insufficient documentation

## 2020-04-24 HISTORY — DX: Mild cognitive impairment of uncertain or unknown etiology: G31.84

## 2020-04-24 NOTE — Progress Notes (Signed)
   Psychometrician Note   Cognitive testing was administered to [name] by Milana Kidney, B.S. (psychometrist) under the supervision of Dr. Christia Reading, Ph.D., licensed psychologist on 04/24/20. [name] did not appear overtly distressed by the testing session per behavioral observation or responses across self-report questionnaires. Dr. Christia Reading, Ph.D. checked in with Madeline Chandler as needed to manage any distress related to testing procedures (if applicable). Rest breaks were offered.    The battery of tests administered was selected by Dr. Christia Reading, Ph.D. with consideration to Madeline Chandler's current level of functioning, the nature of her symptoms, emotional and behavioral responses during interview, level of literacy, observed level of motivation/effort, and the nature of the referral question. This battery was communicated to the psychometrist. Communication between Dr. Christia Reading, Ph.D. and the psychometrist was ongoing throughout the evaluation and Dr. Christia Reading, Ph.D. was immediately accessible at all times. Dr. Christia Reading, Ph.D. provided supervision to the psychometrist on the date of this service to the extent necessary to assure the quality of all services provided.    Madeline Chandler will return within approximately 1-2 weeks for an interactive feedback session with Dr. Melvyn Novas at which time her test performances, clinical impressions, and treatment recommendations will be reviewed in detail. Madeline Chandler understands she can contact our office should she require our assistance before this time.  A total of 160 minutes of billable time were spent face-to-face with Madeline Chandler by the psychometrist. This includes both test administration and scoring time. Billing for these services is reflected in the clinical report generated by Dr. Christia Reading, Ph.D..  This note reflects time spent with the psychometrician and does not include test scores or any clinical  interpretations made by Dr. Melvyn Novas. The full report will follow in a separate note.

## 2020-04-24 NOTE — Progress Notes (Signed)
NEUROPSYCHOLOGICAL EVALUATION [place]. [facility] Department of Neurology  Date of Evaluation: April 24, 2020  Reason for Referral:   Madeline Chandler is a 59 y.o. right-handed African-American female referred by Shelda Pal, D.O., to characterize her current cognitive functioning and assist with diagnostic clarity and treatment planning in the context of subjective cognitive decline.   Assessment and Plan:   Clinical Impression(s): [name] pattern of performance is suggestive of normative impairments across both working memory and confrontation naming, with additional performance variability across executive functioning, visuospatial abilities, and encoding/retrieval aspects of memory. Performance was generally appropriate across processing speed, basic attention, receptive language, verbal fluency, and recognition/consolidation aspects of memory. [name] denied difficulties completing instrumental activities of daily living (ADLs) independently. As such, given evidence for cognitive dysfunction described above, she meets criteria for a Mild Neurocognitive Disorder (formerly "mild cognitive impairment") at the present time.  The etiology for her weaknesses is difficult to determine and potentially multifactorial in nature. She reported minimal sleep, stating that getting three hours over the course of a night was "doing good." She also reported currently untreated obstructive sleep apnea, as well as mild to moderate symptoms of acute anxiety and depression. Taken together, ongoing mood concerns and sleep dysfunction can certainly create and worsen cognitive difficulties, especially across many of the areas which [name] exhibited weakness and/or variability. However, despite this, I cannot rule out the early stages of an underlying cognitive disorder. Prominent weaknesses in executive functioning and visuospatial abilities is a hallmark symptom in  several degenerative illnesses such as Lewy body dementia and corticobasal degeneration. It is important to note that [name] does not report common behavioral characteristics of these conditions at present, which is a positive sign and could point more towards a primary mood/sleep etiology. However, it will be very important to monitor [name] over time in case additional symptoms emerge or her cognitive/functional status continues to decline. Despite some variability across memory measures, her retention percentages were appropriate and she largely performed well across yes/no recognition items. This does not suggest rapid forgetting and diminishes current concerns surrounding Alzheimer's disease.  Recommendations: A repeat neuropsychological evaluation in 24 months (or sooner if functional decline is noted) is recommended to assess the trajectory of future cognitive decline should it occur. This will also aid in future efforts towards improved diagnostic clarity.  I recommend that [name] be referred for a brain MRI to assess for any anatomical abnormalities which could correlate with cognitive weaknesses. This will also allow a better understanding of anatomical changes over time should they occur.  Untreated sleep apnea can certainly create and worsen cognitive dysfunction. If left untreated for an extended period of time, it will also increase her risk for heart attack, stroke, and future cognitive decline (e.g., dementia). She reported that her CPAP machine had never shipped to her and that she needed to reach out to determine why. It is extremely important that she does so as managing this condition will be very important for her overall health moving forward.   A combination of medication and psychotherapy has been shown to be most effective at treating symptoms of anxiety and depression. As such, [name] is encouraged to speak with her prescribing physician regarding medication  adjustments to optimally manage these symptoms. Likewise, [name] is encouraged to consider engaging in short-term psychotherapy to address symptoms of psychiatric distress. She would benefit from an active and collaborative therapeutic environment, rather than one purely  supportive in nature. Recommended treatment modalities include Cognitive Behavioral Therapy (CBT) or Acceptance and Commitment Therapy (ACT).  [name] is encouraged to attend to lifestyle factors for brain health (e.g., regular physical exercise, good nutrition habits, regular participation in cognitively-stimulating activities, and general stress management techniques), which are likely to have benefits for both emotional adjustment and cognition. In fact, in addition to promoting good general health, regular exercise incorporating aerobic activities (e.g., brisk walking, jogging, cycling, etc.) has been demonstrated to be a very effective treatment for depression and stress, with similar efficacy rates to both antidepressant medication and psychotherapy. Optimal control of vascular risk factors (including safe cardiovascular exercise and adherence to dietary recommendations) is encouraged. Likewise, regular (i.e., nightly) compliance with her CPAP machine will also be important.  When learning new information, she would benefit from information being broken up into small, manageable pieces. She may also find it helpful to articulate the material in her own words and in a context to promote encoding at the onset of a new task. This material may need to be repeated multiple times to promote encoding.  Memory can be improved using internal strategies such as rehearsal, repetition, chunking, mnemonics, association, and imagery. External strategies such as written notes in a consistently used memory journal, visual and nonverbal auditory cues such as a calendar on the refrigerator or appointments with alarm, such as on a cell phone, can  also help maximize recall.    To address problems with fluctuating attention, she may wish to consider:   -Avoiding external distractions when needing to concentrate   -Limiting exposure to fast paced environments with multiple sensory demands   -Writing down complicated information and using checklists   -Attempting and completing one task at a time (i.e., no multi-tasking)   -Verbalizing aloud each step of a task to maintain focus   -Reducing the amount of information considered at one time  Review of Records:   Ms. Montroy was seen by Dr. Nani Ravens on 02/04/2020 for follow-up care. At that time, Ms. Covington reported ongoing cognitive dysfunction in the form of short-term memory deficits. She also has a history of obstructive sleep apnea and has not been utilizing CPAP treatment due to her not yet receiving her supplies. Dr. Nani Ravens noted that memory concerns could certainly be influenced by her history of sleep dysfunction and stress/mood-related concerns. Ms. Ayuso reported being interested in a formal cognitive evaluation. As such, she was referred for a comprehensive neuropsychological evaluation to characterize her cognitive abilities and to assist with diagnostic clarity and treatment planning.   No neuroimaging was available for review.   Past Medical History:  Diagnosis Date  . Ankle fracture 03/09/2019   Left  . Arthritis    "hips" (10/28/2016)  . Avascular necrosis of bones of both hips   . Bulging lumbar disc    2  . Cervical pain (neck)   . De Quervain's tenosynovitis, bilateral   . Ductal carcinoma in situ (DCIS) of right breast 1997; 2018   s/p chemotherapy and radiation treatment  . Essential hypertension    08/22/11: BUN 15, creatinine 0.95, GFR 79     . Generalized anxiety disorder   . GERD (gastroesophageal reflux disease)   . History of colonic polyps   . History of migraine headaches    "none in the last few years; had them mostly in late 20's/mid 30's"  .  History of therapeutic radiation   . Hypokalemia   . Insomnia   . Major depressive disorder   .  OSA (obstructive sleep apnea) 02/19/2018   Awaiting CPAP machine as of 04/24/2020  . Restless leg syndrome   . Type 2 diabetes mellitus   . Upper respiratory infection 07/16/2010    Past Surgical History:  Procedure Laterality Date  . ABDOMINAL HYSTERECTOMY  2000s  . ANTERIOR CERVICAL DECOMP/DISCECTOMY FUSION N/A 12/02/2015   Procedure: ANTERIOR CERVICAL DECOMPRESSION/DISCECTOMY FUSION C4 - C7  3 LEVELS;  Surgeon: Melina Schools, MD;  Location: [place];  Service: Orthopedics;  Laterality: N/A;  . BACK SURGERY    . BREAST BIOPSY Right 1997; 08/2016  . BREAST LUMPECTOMY Right 1997  . BREAST RECONSTRUCTION Right 07/07/2017   Procedure: RIGHT NIPPLE AEROLA RECONSTRUCTION WITH LOCAL FLAP FROM RIGHT GROIN;  Surgeon: Irene Limbo, MD;  Location: [place];  Service: Plastics;  Laterality: Right;  . BREAST REDUCTION SURGERY Left 03/07/2017   Procedure: LEFT BREAST REDUCTION FOR SYMMETRY;  Surgeon: Irene Limbo, MD;  Location: [place];  Service: Plastics;  Laterality: Left;  . COLONOSCOPY W/ POLYPECTOMY  2010   Dr Collene Mares  . DILATION AND CURETTAGE OF UTERUS  1990s  . HARDWARE REMOVAL Left 10/31/2019   Procedure: LEFT LEG MEDIAL HARDWARE REMOVAL;  Surgeon: Erle Crocker, MD;  Location: [place];  Service: Orthopedics;  Laterality: Left;  PROCEDURE: LEFT LEG MEDIAL HARDWARE REMOVAL LENGTH OF SURGERY: 1 HOUR  . JOINT REPLACEMENT    . LATISSIMUS FLAP TO BREAST Right 10/28/2016   Procedure: LATISSIMUS FLAP TO BREAST WITH PLACEMENT OF TISSUE EXPANDER ON THE RIGHT;  Surgeon: Irene Limbo, MD;  Location: [place];  Service: Plastics;  Laterality: Right;  RFNA  . MASTECTOMY Right 10/28/2016  . RECONSTRUCTION BREAST W/ LATISSIMUS DORSI FLAP Right 10/28/2016    WITH PLACEMENT OF TISSUE EXPANDER   . REMOVAL OF BILATERAL TISSUE EXPANDERS WITH PLACEMENT  OF BILATERAL BREAST IMPLANTS Right 03/07/2017   Procedure: REMOVAL OF RIGHT BREAST TISSUE EXPANDER WITH PLACEMENT OF RIGHT BREAST IMPLANT;  Surgeon: Irene Limbo, MD;  Location: [place];  Service: Plastics;  Laterality: Right;  . TIBIA IM NAIL INSERTION Left 03/09/2019   Procedure: INTRAMEDULLARY (IM) NAIL TIBIAL;  Surgeon: Erle Crocker, MD;  Location: [place];  Service: Orthopedics;  Laterality: Left;  . TISSUE EXPANDER PLACEMENT Right 10/28/2016   Procedure: TISSUE EXPANDER;  Surgeon: Irene Limbo, MD;  Location: [place];  Service: Plastics;  Laterality: Right;  . TOTAL HIP ARTHROPLASTY Right 05/12/2015   Procedure: RIGHT TOTAL HIP ARTHROPLASTY ANTERIOR APPROACH;  Surgeon: Paralee Cancel, MD;  Location: [facility];  Service: Orthopedics;  Laterality: Right;  . TOTAL HIP ARTHROPLASTY Left 06/23/2015   Procedure: LEFT TOTAL HIP ARTHROPLASTY ANTERIOR APPROACH;  Surgeon: Paralee Cancel, MD;  Location: [facility];  Service: Orthopedics;  Laterality: Left;  . TOTAL MASTECTOMY Right 10/28/2016   Procedure: RIGHT MASTECTOMY;  Surgeon: Alphonsa Overall, MD;  Location: [place];  Service: General;  Laterality: Right;    Current Outpatient Medications:  .  amphetamine-dextroamphetamine (ADDERALL) 20 MG tablet, Take 20 mg by mouth daily., Disp: , Rfl: 0 .  atorvastatin (LIPITOR) 40 MG tablet, TAKE ONE TABLET BY MOUTH DAILY, Disp: 30 tablet, Rfl: 0 .  carvedilol (COREG) 25 MG tablet, TAKE 1 TABLET BY MOUTH TWICE A DAY WITH MEALS, Disp: 180 tablet, Rfl: 1 .  DULoxetine (CYMBALTA) 30 MG capsule, TAKE ONE CAPSULE BY MOUTH DAILY, Disp: 30 capsule, Rfl: 0 .  glucose blood (ONETOUCH VERIO) test strip, Use once daily to check blood sugar.  DX E11.9, Disp:  100 each, Rfl: 1 .  letrozole (FEMARA) 2.5 MG tablet, TAKE ONE TABLET BY MOUTH DAILY, Disp: 30 tablet, Rfl: 5 .  losartan-hydrochlorothiazide (HYZAAR) 100-25 MG tablet, TAKE ONE TABLET BY MOUTH DAILY, Disp: 30 tablet, Rfl: 3 .  methylPREDNISolone (MEDROL  DOSEPAK) 4 MG TBPK tablet, Follow instructions on package., Disp: 21 tablet, Rfl: 0 .  ONETOUCH DELICA LANCETS FINE MISC, Use as directed once daily to check blood sugar.  DXE11.9, Disp: 100 each, Rfl: 1 .  OZEMPIC, 1 MG/DOSE, 4 MG/3ML SOPN, DIAL AND INJECT UNDER THE SKIN 1 MG WEEKLY, Disp: 3 mL, Rfl: 2 .  pantoprazole (PROTONIX) 40 MG tablet, TAKE ONE TABLET BY MOUTH DAILY, Disp: 30 tablet, Rfl: 0 .  Semaglutide, 1 MG/DOSE, (OZEMPIC, 1 MG/DOSE,) 2 MG/1.5ML SOPN, Inject 1 mg into the skin once a week., Disp: 3 mL, Rfl: 5  Clinical Interview:   The following information was obtained during a clinical interview with Ms. Srinivasan prior to cognitive testing.  Cognitive Symptoms: Decreased short-term memory: Endorsed. While at work, she reported trouble recalling next steps when performing certain multi-step tasks. Difficulties were noted to last for a few seconds, followed by her generally being able to recall necessary information. These were said to have been present over the past few months. Outside of work settings, short-term memory difficulties were generally denied. Decreased long-term memory: Denied. Decreased attention/concentration: Endorsed. She reported trouble with sustained attention and increased distractibility ongoing for the past couple of years. She noted that her OB/GYN prescribed her Adderall, which is somewhat helpful in managing these symptoms. She denied ever previously being diagnosed with ADHD.  Reduced processing speed: Endorsed. She reported that her processing speed has seemed "much slower." Difficulties with executive functions: Endorsed. She reported trouble with organization and indecision. Trouble with impulsivity or using good judgment was denied. Overt personality changes were also denied.  Difficulties with emotion regulation: Denied. Difficulties with receptive language: Endorsed. However, difficulties were generally attributed to deficits in processing speed and  sustained attention rather than frank issues with comprehension.  Difficulties with word finding: Endorsed. Decreased visuoperceptual ability: Denied.  Difficulties completing ADLs: Denied.  Additional Medical History: History of traumatic brain injury/concussion: Denied. History of stroke: Denied. History of seizure activity: Denied. History of known exposure to toxins: Denied. Symptoms of chronic pain: Endorsed. She reported breaking her left leg in August 2020 and has continued to have residual left ankle pain. Other pain-related symptoms were largely denied.  Experience of frequent headaches/migraines: Denied. She did report a history of migraine headaches, especially surrounding her menstrual cycle. These have since dissipated.  Frequent instances of dizziness/vertigo: Endorsed. Difficulties were largely attributed to her standing up quickly, or due to dehydration and being outside when the temperature is high.   Sensory changes: She wears glasses with positive effect. She reported that her sense of taste has seemed different over the past few weeks but generally denied difficulties with taste. Other sensory changes/difficulties (e.g., hearing or smell) were denied.  Balance/coordination difficulties: Endorsed. Since breaking her leg in August 2020, she reported residual balance concerns due to feeling unsteady on her feet. She denied recent falls but did report having to catch herself often and commonly sways from side to side while walking. Some balance concerns were additionally present prior to her breaking her leg.  Other motor difficulties: Denied.  Other medical conditions: She has a history of breast cancer both in 1997 as well as 2018. She completed both chemotherapy and radiation treatments. Cognitive dysfunction was largely  denied following these treatments.   Sleep History: Estimated hours obtained each night: 3 hours was described as "doing good." Difficulties falling asleep:  Endorsed. Difficulties staying asleep: Endorsed. She reported commonly waking up several times throughout the night, largely due to unknown reasons.  Feels rested and refreshed upon awakening: Denied.  History of snoring: Endorsed. History of waking up gasping for air: Endorsed. Witnessed breath cessation while asleep: Endorsed. She reported being diagnosed with obstructive sleep apnea 1-2 years prior. She does not use a CPAP machine currently due to these supplies never being shipped to her. She reported a need to call again to see if these supplies can be re-shipped. At present, this condition appears untreated.   History of vivid dreaming: Denied. Excessive movement while asleep: Denied. Instances of acting out her dreams: Denied.  Psychiatric/Behavioral Health History: Depression: Endorsed. She reported a longstanding history of mild depression. She noted that these symptoms are often exacerbated by the weather/season (e.g., worse when it is cloudy or dreary outside). Currently, she described her mood as "blah." She reported taking Cymbalta but was unsure if this medication was having an effect. She previously utilized individual psychotherapy through the [employer] at her work. However, she experienced anxiety talking on the telephone with this individual and discontinued after a very brief number of sessions. Current or remote suicidal ideation, intent, or plan was denied.  Anxiety: Endorsed. She reported longstanding symptoms of anxiety which seemed largely generalized in nature. However, she did report a potential origination of these symptoms stemming from constantly being on the phone with customers/providers at work approximately 8 years prior. As with depression, it was unclear if current medications were effective at treating these symptoms presently. Mania: Denied. Trauma History: Denied. Visual/auditory hallucinations: Denied. Delusional thoughts: Denied.  Tobacco:  Denied. Alcohol: She denied current alcohol consumption as well as a history of problematic alcohol abuse or dependence.  Recreational drugs: Denied. Caffeine: One large cup of coffee in the morning.   Family History: Problem Relation Age of Onset  . Hypertension Mother   . Diabetes Mother   . Breast cancer Mother 75  . Hypertension Sister   . Hypertension Brother   . Cancer Maternal Aunt        leukemia  . Breast cancer Paternal Aunt        diagnosed in 40s or 64s  . Cancer Paternal Uncle        unknown type  . Cancer Paternal Aunt        unknown type  . Breast cancer Cousin 74  . Dementia Maternal Grandmother   . Stroke Neg Hx   . Heart disease Neg Hx    This information was confirmed by Ms. Winfree.  Academic/Vocational History: Highest level of educational attainment: 13 years. She graduated from high school and completed one additional year of schooling at a local community college. She described herself as an average (B/C) student in academic settings. Math was noted as a likely relative weakness.  History of developmental delay: Denied. History of grade repetition: Denied. Enrollment in special education courses: Denied. History of LD/ADHD: Denied.  Employment: She currently works for [employer]. 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   Evaluation Results:   Behavioral Observations: Ms. Sachs was unaccompanied, arrived to her appointment on time, and was appropriately dressed and groomed. She appeared alert and oriented. Observed gait and station were within normal limits. Gross motor functioning appeared intact upon informal observation and no abnormal movements (e.g., tremors)  were noted. Her affect was generally relaxed and positive, but did range appropriately given the subject being discussed during the clinical interview or the task at hand during testing procedures. Spontaneous speech was fluent and word finding difficulties  were not observed during the clinical interview. Thought processes were coherent, organized, and normal in content. Insight into her cognitive difficulties appeared adequate. During testing, sustained attention was appropriate. Task engagement was adequate and she persisted when challenged. Overall, Ms. Francesconi was cooperative with the clinical interview and subsequent testing procedures.   Adequacy of Effort: The validity of neuropsychological testing is limited by the extent to which the individual being tested may be assumed to have exerted adequate effort during testing. Ms. Philson expressed her intention to perform to the best of her abilities and exhibited adequate task engagement and persistence. Scores across stand-alone and embedded performance validity measures were within expectation. As such, the results of the current evaluation are believed to be a valid representation of Ms. Niccoli's current cognitive functioning.  Test Results: Ms. Bronaugh was fully oriented at the time of the current evaluation.  Intellectual abilities based upon educational and vocational attainment were estimated to be in the average range. Premorbid abilities were estimated to be within the lower limits of the average range based upon a single-word reading test.   Processing speed was below average to above average. Basic attention was below average. More complex attention (e.g., working memory) was exceptionally low. Executive functioning was variable, ranging from the exceptionally low to average normative ranges, while still representing a weakness across the current evaluation.  Assessed receptive language abilities were above average. Likewise, Ms. Runnion did not exhibit any difficulties comprehending task instructions and answered all questions asked of her appropriately. Assessed expressive language was variable. Verbal fluency was average while confrontation naming was exceptionally low.    Assessed  visuospatial/visuoconstructional abilities were variable, ranging from the well below average to average normative ranges. Points were lost on her drawing of a clock due to mild spatial abnormalities in numerical arrangement, as well as incorrect hand placement. Points were lost on her drawing of a complex figure due to several spatial abnormalities and the omission of several key aspects.    Learning (i.e., encoding) of novel verbal and visual information was variable, ranging from the exceptionally low to average normative ranges. Spontaneous delayed recall (i.e., retrieval) of previously learned information was well below average to average. Retention rates were 88% across a story learning task, 86% across a list learning task, and 75% across a shape learning task. Performance across recognition tasks was below average to above average, suggesting evidence for information consolidation.   Results of emotional screening instruments suggested that recent symptoms of generalized anxiety were in the moderate range, while symptoms of depression were within the mild range. A screening instrument assessing recent sleep quality suggested the presence of moderate sleep dysfunction.  Tables of Scores:   Note: This summary of test scores accompanies the interpretive report and should not be considered in isolation without reference to the appropriate sections in the text. Descriptors are based on appropriate normative data and may be adjusted based on clinical judgment. The terms "impaired" and "within normal limits (WNL)" are used when a more specific level of functioning cannot be determined.       Effort Testing:   DESCRIPTOR       ACS Word Choice: --- --- Within Expectation  Dot Counting Test: --- --- Within Expectation  NAB EVI: --- --- Within Expectation  D-KEFS Color Word Effort Index: --- --- Within Expectation       Orientation:      Raw Score Percentile   NAB Orientation, Form 1 29/29 --- ---        Cognitive Screening:           Raw Score Percentile   SLUMS: 23/30 --- ---       Intellectual Functioning:           Standard Score Percentile   Test of Premorbid Functioning: 90 25 Average       Memory:          NAB Memory Module, Form 2: Standard Score/ T Score Percentile   List Learning       Total Trials 1-3 17/36 (36) 8 Well Below Average    List B 2/12 (34) 5 Well Below Average    Short Delay Free Recall 7/12 (44) 27 Average    Long Delay Free Recall 6/12 (42) 21 Below Average    Retention Percentage 86 (48) 42 Average    Recognition Discriminability 5 (38) 12 Below Average  Shape Learning       Total Trials 1-3 8/27 (26) 1 Exceptionally Low    Delayed Recall 3/9 (33) 5 Well Below Average    Retention Percentage 75 (43) 25 Average    Recognition Discriminability 6 (45) 31 Average  Story Learning       Immediate Recall 40/80 (41) 18 Below Average    Delayed Recall 21/40 (43) 25 Average    Retention Percentage 88 (50) 50 Average  Daily Living Memory       Immediate Recall 44/51 (50) 50 Average    Delayed Recall 14/17 (48) 42 Average    Retention Percentage 82 (46) 34 Average    Recognition Hits 10/10 (59) 82 Above Average       Attention/Executive Function:          Trail Making Test (TMT): Raw Score (T Score) Percentile     Part A 45 secs.,  2 errors (42) 21 Below Average    Part B 104 secs.,  1 error (51) 54 Average         Scaled Score Percentile   WAIS-IV Coding: 9 37 Average       NAB Attention Module, Form 1: T Score Percentile     Digits Forward 40 16 Below Average    Digits Backwards 29 2 Exceptionally Low       D-KEFS Color-Word Interference Test: Raw Score (Scaled Score) Percentile     Color Naming 29 secs. (12) 75 Above Average    Word Reading 19 secs. (13) 84 Above Average    Inhibition 71 secs. (10) 50 Average      Total Errors 11 errors (1) <1 Exceptionally Low    Inhibition/Switching 75 secs. (10) 50 Average      Total Errors 6 errors  (7) 16 Below Average       D-KEFS Verbal Fluency Test: Raw Score (Scaled Score) Percentile     Letter Total Correct 33 (9) 37 Average    Category Total Correct 34 (10) 50 Average    Category Switching Total Correct 13 (11) 63 Average    Category Switching Accuracy 11 (10) 50 Average      Total Set Loss Errors 0 (13) 84 Above Average      Total Repetition Errors 5 (8) 25 Average       D-KEFS 20 Questions Test: Scaled Score Percentile  Total Weighted Achievement Score 8 25 Average    Initial Abstraction Score 7 16 Below Average       [place] Card Sorting Test: Raw Score Percentile     Categories (trials) 1 (64) 6-10 Well Below Average    Total Errors 35 4 Well Below Average    Perseverative Errors 16 14 Below Average    Non-Perseverative Errors 19 4 Well Below Average    Failure to Maintain Set 1 --- ---       Language:          Verbal Fluency Test: Raw Score (T Score) Percentile     Phonemic Fluency (FAS) 33 (46) 34 Average    Animal Fluency 14 (45) 31 Average        NAB Language Module, Form 1: T Score Percentile     Auditory Comprehension 57 75 Above Average    Naming 26/31 (29) 2 Exceptionally Low       Visuospatial/Visuoconstruction:      Raw Score Percentile   Clock Drawing: 7/10 --- Within Normal Limits       NAB Spatial Module, Form 1: T Score Percentile     Figure Drawing Copy 35 7 Well Below Average    Figure Drawing Immediate Recall 44 27 Average        Scaled Score Percentile   WAIS-IV Block Design: 8 25 Average  WAIS-IV Matrix Reasoning: 5 5 Well Below Average       Mood and Personality:      Raw Score Percentile   Beck Depression Inventory - II: 18 --- Mild  PROMIS Anxiety Questionnaire: 21 --- Moderate       Additional Questionnaires:      Raw Score Percentile   PROMIS Sleep Disturbance Questionnaire: 34 --- Moderate   Informed Consent and Coding/Compliance:   Ms. Arntz was provided with a verbal description of the nature and purpose of the  present neuropsychological evaluation. Also reviewed were the foreseeable risks and/or discomforts and benefits of the procedure, limits of confidentiality, and mandatory reporting requirements of this provider. The patient was given the opportunity to ask questions and receive answers about the evaluation. Oral consent to participate was provided by the patient.   This evaluation was conducted by Christia Reading, Ph.D., licensed clinical neuropsychologist. Ms. Trapani completed a comprehensive clinical interview with Dr. Melvyn Novas, billed as one unit [phone], and 160 minutes of cognitive testing and scoring, billed as one unit [phone] and four additional units [id number]. Psychometrist Milana Kidney, B.S., assisted Dr. Melvyn Novas with test administration and scoring procedures. As a separate and discrete service, Dr. Melvyn Novas spent a total of 160 minutes in interpretation and report writing billed as one unit [phone] and two units [id number].

## 2020-04-27 ENCOUNTER — Encounter: Payer: Self-pay | Admitting: Psychology

## 2020-05-04 ENCOUNTER — Ambulatory Visit (INDEPENDENT_AMBULATORY_CARE_PROVIDER_SITE_OTHER): Payer: 59 | Admitting: Psychology

## 2020-05-04 ENCOUNTER — Other Ambulatory Visit: Payer: Self-pay

## 2020-05-04 DIAGNOSIS — G4733 Obstructive sleep apnea (adult) (pediatric): Secondary | ICD-10-CM | POA: Diagnosis not present

## 2020-05-04 DIAGNOSIS — G3184 Mild cognitive impairment, so stated: Secondary | ICD-10-CM | POA: Diagnosis not present

## 2020-05-04 DIAGNOSIS — F331 Major depressive disorder, recurrent, moderate: Secondary | ICD-10-CM

## 2020-05-04 NOTE — Progress Notes (Signed)
   Neuropsychology Feedback Session Madeline Chandler. [facility]  Reason for Referral:   Madeline Cocker Maddenis a 59 y.o. right-handed African-American female referred by Shelda Pal, D.O.,to characterize hercurrent cognitive functioning and assist with diagnostic clarity and treatment planning in the context of subjective cognitive decline.   Feedback:   Madeline Chandler completed a comprehensive neuropsychological evaluation on 04/24/2020. Please refer to that encounter for the full report and recommendations. Briefly, results suggested normative impairments across both working memory and confrontation naming, with additional performance variability across executive functioning, visuospatial abilities, and encoding/retrieval aspects of memory. The etiology for her weaknesses is difficult to determine and potentially multifactorial in nature. She reported minimal sleep, stating that getting three hours over the course of a night was "doing good." She also reported currently untreated obstructive sleep apnea, as well as mild to moderate symptoms of acute anxiety and depression. Taken together, ongoing mood concerns and sleep dysfunction can certainly create and worsen cognitive difficulties, especially across many of the areas which Madeline Chandler exhibited weakness and/or variability. However, despite this, I cannot rule out the early stages of an underlying cognitive disorder. It is important to note that Madeline Chandler did not report common behavioral characteristics of various neurodegenerative conditions at present, which is a positive sign and could point more towards a primary mood/sleep etiology.  Madeline Chandler was unaccompanied during the current telephone call. She was within her residence while I was within my office. I discussed the limitations of evaluation and management by telemedicine and the availability of in person appointments. Madeline Chandler expressed her  understanding and agreed to proceed. Content of the current session focused on the results of her neuropsychological evaluation. Madeline Chandler was given the opportunity to ask questions and her questions were answered. She was encouraged to reach out should additional questions arise. Her reported is available to her on MyChart.      19 minutes were spent conducting the current feedback session with Madeline Chandler, billed as one unit [phone].

## 2020-05-04 NOTE — Patient Instructions (Signed)
A repeat neuropsychological evaluation in 24 months (or sooner if functional decline is noted) is recommended to assess the trajectory of future cognitive decline should it occur. This will also aid in future efforts towards improved diagnostic clarity.  I recommend that Madeline Chandler be referred for a brain MRI to assess for any anatomical abnormalities which could correlate with cognitive weaknesses. This will also allow a better understanding of anatomical changes over time should they occur.  Untreated sleep apnea can certainly create and worsen cognitive dysfunction. If left untreated for an extended period of time, it will also increase her risk for heart attack, stroke, and future cognitive decline (e.g., dementia). She reported that her CPAP machine had never shipped to her and that she needed to reach out to determine why. It is extremely important that she does so as managing this condition will be very important for her overall health moving forward.   A combination of medication and psychotherapy has been shown to be most effective at treating symptoms of anxiety and depression. As such, Madeline Chandler is encouraged to speak with her prescribing physician regarding medication adjustments to optimally manage these symptoms. Likewise, Madeline Chandler is encouraged to consider engaging in short-term psychotherapy to address symptoms of psychiatric distress. She would benefit from an active and collaborative therapeutic environment, rather than one purely supportive in nature. Recommended treatment modalities include Cognitive Behavioral Therapy (CBT) or Acceptance and Commitment Therapy (ACT).  Madeline Chandler is encouraged to attend to lifestyle factors for brain health (e.g., regular physical exercise, good nutrition habits, regular participation in cognitively-stimulating activities, and general stress management techniques), which are likely to have benefits for both emotional adjustment and cognition. In  fact, in addition to promoting good general health, regular exercise incorporating aerobic activities (e.g., brisk walking, jogging, cycling, etc.) has been demonstrated to be a very effective treatment for depression and stress, with similar efficacy rates to both antidepressant medication and psychotherapy. Optimal control of vascular risk factors (including safe cardiovascular exercise and adherence to dietary recommendations) is encouraged. Likewise, regular (i.e., nightly) compliance with her CPAP machine will also be important.  When learning new information, she would benefit from information being broken up into small, manageable pieces. She may also find it helpful to articulate the material in her own words and in a context to promote encoding at the onset of a new task. This material may need to be repeated multiple times to promote encoding.  Memory can be improved using internal strategies such as rehearsal, repetition, chunking, mnemonics, association, and imagery. External strategies such as written notes in a consistently used memory journal, visual and nonverbal auditory cues such as a calendar on the refrigerator or appointments with alarm, such as on a cell phone, can also help maximize recall.    To address problems with fluctuating attention, she may wish to consider:   -Avoiding external distractions when needing to concentrate   -Limiting exposure to fast paced environments with multiple sensory demands   -Writing down complicated information and using checklists   -Attempting and completing one task at a time (i.e., no multi-tasking)   -Verbalizing aloud each step of a task to maintain focus   -Reducing the amount of information considered at one time

## 2020-05-08 ENCOUNTER — Encounter: Payer: Self-pay | Admitting: Family Medicine

## 2020-05-08 ENCOUNTER — Ambulatory Visit (INDEPENDENT_AMBULATORY_CARE_PROVIDER_SITE_OTHER): Payer: 59 | Admitting: Family Medicine

## 2020-05-08 ENCOUNTER — Other Ambulatory Visit: Payer: Self-pay

## 2020-05-08 VITALS — BP 144/86 | HR 83 | Temp 98.1°F | Ht 66.0 in | Wt 210.0 lb

## 2020-05-08 DIAGNOSIS — G3184 Mild cognitive impairment, so stated: Secondary | ICD-10-CM

## 2020-05-08 DIAGNOSIS — Z23 Encounter for immunization: Secondary | ICD-10-CM | POA: Diagnosis not present

## 2020-05-08 NOTE — Addendum Note (Signed)
Addended by: Sharon Seller B on: 05/08/2020 10:49 AM   Modules accepted: Orders

## 2020-05-08 NOTE — Patient Instructions (Addendum)
Someone will reach out regarding your MRI/scan of your head and we will be in touch.   Let us know if you need anything.

## 2020-05-08 NOTE — Progress Notes (Signed)
Chief Complaint  Patient presents with  . Follow-up    from Neuro appt.    Subjective: Patient is a 59 y.o. female here for f/u from neuropsych testing.  Mild neurocog impairment w untx'd OSA dx from neuropsych team. Rec'd MRI brain. Here to discuss that and expectations. She has no new s/s's.     Past Medical History:  Diagnosis Date  . Ankle fracture 03/09/2019   Left  . Arthritis    "hips" (10/28/2016)  . Avascular necrosis of bones of both hips   . Bulging lumbar disc    2  . Cervical pain (neck)   . De Quervain's tenosynovitis, bilateral   . Ductal carcinoma in situ (DCIS) of right breast 1997; 2018   s/p chemotherapy and radiation treatment  . Essential hypertension    08/22/11: BUN 15, creatinine 0.95, GFR 79     . Generalized anxiety disorder   . GERD (gastroesophageal reflux disease)   . History of colonic polyps   . History of migraine headaches    "none in the last few years; had them mostly in late 20's/mid 30's"  . History of therapeutic radiation   . Hypokalemia   . Insomnia   . Major depressive disorder   . Mild neurocognitive disorder, unclear etiology 04/24/2020  . OSA (obstructive sleep apnea) 02/19/2018   Awaiting CPAP machine as of 04/24/2020  . Restless leg syndrome   . Type 2 diabetes mellitus   . Upper respiratory infection 07/16/2010    Objective: BP (!) 144/86 (BP Location: Left Arm, Patient Position: Sitting, Cuff Size: Normal)   Pulse 83   Temp 98.1 F (36.7 C) (Oral)   Ht 5\' 6"  (1.676 m)   Wt 210 lb (95.3 kg)   SpO2 98%   BMI 33.89 kg/m  General: Awake, appears stated age Heart: RRR Lungs: CTAB, no rales, wheezes or rhonchi. No accessory muscle use Neuro: A&Ox4, fluent and goal oriented speech, no cerebellar signs, DTR's equal and symmetric throughout, no clonus Psych: Age appropriate judgment and insight, normal affect and mood  Assessment and Plan: Mild neurocognitive disorder, unclear etiology - Plan: MR Brain Wo  Contrast  Need for influenza vaccination - Plan: Flu Vaccine QUAD 6+ mos PF IM (Fluarix Quad PF)  MRI ordered to rule out underlying cause for cog issues.  Shingrix and flu shot today. F/u in 2 mo for 2nd Shingrix.  The patient voiced understanding and agreement to the plan.  [place], DO 05/08/20  10:39 AM

## 2020-05-10 ENCOUNTER — Other Ambulatory Visit: Payer: Self-pay | Admitting: Family Medicine

## 2020-05-10 DIAGNOSIS — F411 Generalized anxiety disorder: Secondary | ICD-10-CM

## 2020-05-10 DIAGNOSIS — K219 Gastro-esophageal reflux disease without esophagitis: Secondary | ICD-10-CM

## 2020-05-21 ENCOUNTER — Encounter: Payer: 59 | Admitting: Psychology

## 2020-05-21 ENCOUNTER — Other Ambulatory Visit: Payer: Self-pay | Admitting: Family Medicine

## 2020-05-23 ENCOUNTER — Other Ambulatory Visit: Payer: Self-pay

## 2020-05-23 ENCOUNTER — Ambulatory Visit (HOSPITAL_BASED_OUTPATIENT_CLINIC_OR_DEPARTMENT_OTHER)
Admission: RE | Admit: 2020-05-23 | Discharge: 2020-05-23 | Disposition: A | Discharge status: Home / Self Care | Payer: 59 | Source: Ambulatory Visit | Attending: Family Medicine | Admitting: Family Medicine

## 2020-05-23 DIAGNOSIS — G3184 Mild cognitive impairment, so stated: Secondary | ICD-10-CM | POA: Insufficient documentation

## 2020-05-24 ENCOUNTER — Other Ambulatory Visit: Payer: Self-pay | Admitting: Family Medicine

## 2020-05-24 DIAGNOSIS — F411 Generalized anxiety disorder: Secondary | ICD-10-CM

## 2020-05-24 DIAGNOSIS — K219 Gastro-esophageal reflux disease without esophagitis: Secondary | ICD-10-CM

## 2020-05-27 ENCOUNTER — Encounter: Payer: 59 | Admitting: Psychology

## 2020-06-06 ENCOUNTER — Other Ambulatory Visit: Payer: Self-pay | Admitting: Family Medicine

## 2020-06-09 ENCOUNTER — Telehealth: Payer: Self-pay

## 2020-06-09 NOTE — Telephone Encounter (Signed)
Called informed the patient of PCP instructions. Scheduled appointment for Wednesday 06/10/2020 at 3:15 pm.

## 2020-06-09 NOTE — Telephone Encounter (Signed)
Sched appt plz, can be virtual. Ty.

## 2020-06-09 NOTE — Telephone Encounter (Signed)
Patient called and stated she needs something to help her sleep.  She said she was sleeping at least 2-3 hours a night, but that is no longer the case, sometimes it is 30 minutes.  I let patient know she may need an appointment if this has not been discussed previously with her provider.  Patient expressed understanding.

## 2020-06-09 NOTE — Telephone Encounter (Signed)
Called left message to call back 

## 2020-06-10 ENCOUNTER — Other Ambulatory Visit: Payer: Self-pay

## 2020-06-10 ENCOUNTER — Ambulatory Visit (INDEPENDENT_AMBULATORY_CARE_PROVIDER_SITE_OTHER): Payer: 59 | Admitting: Family Medicine

## 2020-06-10 ENCOUNTER — Encounter: Payer: Self-pay | Admitting: Family Medicine

## 2020-06-10 VITALS — BP 134/80 | HR 84 | Temp 98.0°F | Ht 65.5 in | Wt 209.1 lb

## 2020-06-10 DIAGNOSIS — G4700 Insomnia, unspecified: Secondary | ICD-10-CM

## 2020-06-10 MED ORDER — TRAZODONE HCL 50 MG PO TABS: 25.0000 mg | ORAL_TABLET | Freq: Every evening | ORAL | 3 refills | Status: DC | PRN

## 2020-06-10 NOTE — Progress Notes (Signed)
Chief Complaint  Patient presents with  . Insomnia    Subjective: Patient is a 59 y.o. female here for insomnia.  Has had many years of poor sleep that has gotten worse over past couple weeks. No triggers for this. No racing thoughts. She does not feel well rested. She has trouble staying asleep, has been getting 30-60 min nightly. She is making errors since this happens. She does not consume EtOH. Last cup of coffee is before noon. She does not nap. No hx of anxiety/depression. She does not follow with a counselor. She tried melatonin without relief.   Past Medical History:  Diagnosis Date  . Ankle fracture 03/09/2019   Left  . Arthritis    "hips" (10/28/2016)  . Avascular necrosis of bones of both hips   . Bulging lumbar disc    2  . Cervical pain (neck)   . De Quervain's tenosynovitis, bilateral   . Ductal carcinoma in situ (DCIS) of right breast 1997; 2018   s/p chemotherapy and radiation treatment  . Essential hypertension    08/22/11: BUN 15, creatinine 0.95, GFR 79     . Generalized anxiety disorder   . GERD (gastroesophageal reflux disease)   . History of colonic polyps   . History of migraine headaches    "none in the last few years; had them mostly in late 20's/mid 30's"  . History of therapeutic radiation   . Hypokalemia   . Insomnia   . Major depressive disorder   . Mild neurocognitive disorder, unclear etiology 04/24/2020  . OSA (obstructive sleep apnea) 02/19/2018   Awaiting CPAP machine as of 04/24/2020  . Restless leg syndrome   . Type 2 diabetes mellitus   . Upper respiratory infection 07/16/2010    Objective: BP 134/80 (BP Location: Left Arm, Patient Position: Sitting, Cuff Size: Normal)   Pulse 84   Temp 98 F (36.7 C) (Oral)   Ht 5' 5.5" (1.664 m)   Wt 209 lb 2 oz (94.9 kg)   SpO2 97%   BMI 34.27 kg/m  General: Awake, appears stated age Heart: RRR, no murmurs Lungs: CTAB, no rales, wheezes or rhonchi. No accessory muscle use Psych: Age  appropriate judgment and insight, normal affect and mood  Assessment and Plan: Insomnia, unspecified type - Plan: traZODone (DESYREL) 50 MG tablet  Status: Chronic, uncontrolled/worsening. [facility] info given. Sleep hygiene info given. Start trazodone prn. Sleep specialist info given for her OSA requiring CPAP.  F/u in 1 mo.  The patient voiced understanding and agreement to the plan.  [place], DO 06/10/20  3:53 PM

## 2020-06-10 NOTE — Patient Instructions (Addendum)
Sleep Hygiene Tips:  Do not watch TV or look at screens within 1 hour of going to bed. If you do, make sure there is a blue light filter (nighttime mode) involved.  Try to go to bed around the same time every night. Wake up at the same time within 1 hour of regular time. Ex: If you wake up at 7 AM for work, do not sleep past 8 AM on days that you don't work.  Do not drink alcohol before bedtime.  Do not consume caffeine-containing beverages after noon or within 9 hours of intended bedtime.  Get regular exercise/physical activity in your life, but not within 2 hours of planned bedtime.  Do not take naps.   Do not eat within 2 hours of planned bedtime.  Melatonin, 3-5 mg 30-60 minutes before planned bedtime may be helpful.   The bed should be for sleep or sex only. If after 20-30 minutes you are unable to fall asleep, get up and do something relaxing. Do this until you feel ready to go to sleep again.   Aim to do some physical exertion for 150 minutes per week. This is typically divided into 5 days per week, 30 minutes per day. The activity should be enough to get your heart rate up. Anything is better than nothing if you have time constraints.  Sleep is important to [place] all. Getting good sleep is imperative to adequate functioning during the day. Work with our counselors who are trained to help people obtain quality sleep. Call [phone] to schedule an appointment or if you are curious about insurance coverage/cost.  Let us know if you need anything.

## 2020-06-17 ENCOUNTER — Other Ambulatory Visit: Payer: Self-pay | Admitting: Family Medicine

## 2020-06-17 DIAGNOSIS — I10 Essential (primary) hypertension: Secondary | ICD-10-CM

## 2020-06-23 ENCOUNTER — Other Ambulatory Visit: Payer: Self-pay | Admitting: Family Medicine

## 2020-06-23 DIAGNOSIS — F411 Generalized anxiety disorder: Secondary | ICD-10-CM

## 2020-06-23 DIAGNOSIS — K219 Gastro-esophageal reflux disease without esophagitis: Secondary | ICD-10-CM

## 2020-07-08 ENCOUNTER — Other Ambulatory Visit: Payer: Self-pay

## 2020-07-08 ENCOUNTER — Ambulatory Visit (INDEPENDENT_AMBULATORY_CARE_PROVIDER_SITE_OTHER): Payer: 59

## 2020-07-08 DIAGNOSIS — Z23 Encounter for immunization: Secondary | ICD-10-CM | POA: Diagnosis not present

## 2020-07-08 NOTE — Progress Notes (Signed)
Shingrix received today tolerated well

## 2020-07-09 ENCOUNTER — Telehealth: Payer: Self-pay | Admitting: Family Medicine

## 2020-07-09 NOTE — Telephone Encounter (Signed)
Patient states she had her 2nd shingle vaccine yesterday she is now experiencing tiredness and fever of 99.9 & 100.  Please advise

## 2020-07-10 NOTE — Telephone Encounter (Signed)
Please advise of further instructions for patient.

## 2020-07-10 NOTE — Telephone Encounter (Signed)
Patient notified and verbalized understanding. 

## 2020-07-10 NOTE — Telephone Encounter (Signed)
Advise patient: Rest Drink plenty of fluids Ice to the area of the vaccine Tylenol  500 mg OTC 2 tabs a day every 8 hours as needed for pain OTC Benadryl 1 or 2 times If severe symptoms, rash, high fever: ER or urgent care If not gradually better call again.

## 2020-07-15 ENCOUNTER — Ambulatory Visit: Payer: 59 | Admitting: Pulmonary Disease

## 2020-07-17 ENCOUNTER — Ambulatory Visit: Payer: 59 | Admitting: Primary Care

## 2020-07-17 LAB — HM DIABETES EYE EXAM

## 2020-07-19 ENCOUNTER — Other Ambulatory Visit: Payer: Self-pay | Admitting: Family Medicine

## 2020-07-19 DIAGNOSIS — K219 Gastro-esophageal reflux disease without esophagitis: Secondary | ICD-10-CM

## 2020-07-19 DIAGNOSIS — F411 Generalized anxiety disorder: Secondary | ICD-10-CM

## 2020-07-24 ENCOUNTER — Encounter: Payer: Self-pay | Admitting: Primary Care

## 2020-07-24 ENCOUNTER — Ambulatory Visit (INDEPENDENT_AMBULATORY_CARE_PROVIDER_SITE_OTHER): Payer: 59 | Admitting: Primary Care

## 2020-07-24 ENCOUNTER — Other Ambulatory Visit: Payer: Self-pay

## 2020-07-24 VITALS — BP 122/82 | HR 84 | Temp 97.6°F | Ht 65.5 in | Wt 210.6 lb

## 2020-07-24 DIAGNOSIS — G4733 Obstructive sleep apnea (adult) (pediatric): Secondary | ICD-10-CM

## 2020-07-24 NOTE — Patient Instructions (Addendum)
Recommendations: Needs to repeat sleep study to confirm dx of sleep apnea since last study was in 2019 Work on maintaining health weight, encourage side sleeping position Do not drive if experiencing excessive daytime fatigue or somnolence   Orders: HST re: hx sleep apnea   Follow-up: Televisit with Beth NP in 4 weeks to review sleep study (if you haven't had sleep before follow-up, please call and reschedule our apt)    Sleep Apnea Sleep apnea affects breathing during sleep. It causes breathing to stop for a short time or to become shallow. It can also increase the risk of:  Heart attack.  Stroke.  Being very overweight (obese).  Diabetes.  Heart failure.  Irregular heartbeat. The goal of treatment is to help you breathe normally again. What are the causes? There are three kinds of sleep apnea:  Obstructive sleep apnea. This is caused by a blocked or collapsed airway.  Central sleep apnea. This happens when the brain does not send the right signals to the muscles that control breathing.  Mixed sleep apnea. This is a combination of obstructive and central sleep apnea. The most common cause of this condition is a collapsed or blocked airway. This can happen if:  Your throat muscles are too relaxed.  Your tongue and tonsils are too large.  You are overweight.  Your airway is too small. What increases the risk?  Being overweight.  Smoking.  Having a small airway.  Being older.  Being female.  Drinking alcohol.  Taking medicines to calm yourself (sedatives or tranquilizers).  Having family members with the condition. What are the signs or symptoms?  Trouble staying asleep.  Being sleepy or tired during the day.  Getting angry a lot.  Loud snoring.  Headaches in the morning.  Not being able to focus your mind (concentrate).  Forgetting things.  Less interest in sex.  Mood swings.  Personality changes.  Feelings of sadness  (depression).  Waking up a lot during the night to pee (urinate).  Dry mouth.  Sore throat. How is this diagnosed?  Your medical history.  A physical exam.  A test that is done when you are sleeping (sleep study). The test is most often done in a sleep lab but may also be done at home. How is this treated?   Sleeping on your side.  Using a medicine to get rid of mucus in your nose (decongestant).  Avoiding the use of alcohol, medicines to help you relax, or certain pain medicines (narcotics).  Losing weight, if needed.  Changing your diet.  Not smoking.  Using a machine to open your airway while you sleep, such as: ? An oral appliance. This is a mouthpiece that shifts your lower jaw forward. ? A CPAP device. This device blows air through a mask when you breathe out (exhale). ? An EPAP device. This has valves that you put in each nostril. ? A BPAP device. This device blows air through a mask when you breathe in (inhale) and breathe out.  Having surgery if other treatments do not work. It is important to get treatment for sleep apnea. Without treatment, it can lead to:  High blood pressure.  Coronary artery disease.  In men, not being able to have an erection (impotence).  Reduced thinking ability. Follow these instructions at home: Lifestyle  Make changes that your doctor recommends.  Eat a healthy diet.  Lose weight if needed.  Avoid alcohol, medicines to help you relax, and some pain medicines.  Do  not use any products that contain nicotine or tobacco, such as cigarettes, e-cigarettes, and chewing tobacco. If you need help quitting, ask your doctor. General instructions  Take over-the-counter and prescription medicines only as told by your doctor.  If you were given a machine to use while you sleep, use it only as told by your doctor.  If you are having surgery, make sure to tell your doctor you have sleep apnea. You may need to bring your device with  you.  Keep all follow-up visits as told by your doctor. This is important. Contact a doctor if:  The machine that you were given to use during sleep bothers you or does not seem to be working.  You do not get better.  You get worse. Get help right away if:  Your chest hurts.  You have trouble breathing in enough air.  You have an uncomfortable feeling in your back, arms, or stomach.  You have trouble talking.  One side of your body feels weak.  A part of your face is hanging down. These symptoms may be an emergency. Do not wait to see if the symptoms will go away. Get medical help right away. Call your local emergency services (911 in the [place] Do not drive yourself to the hospital. Summary  This condition affects breathing during sleep.  The most common cause is a collapsed or blocked airway.  The goal of treatment is to help you breathe normally while you sleep. This information is not intended to replace advice given to you by your health care provider. Make sure you discuss any questions you have with your health care provider. Document Revised: 05/11/2018 Document Reviewed: 03/20/2018 Elsevier Patient Education  [place].

## 2020-07-24 NOTE — Assessment & Plan Note (Signed)
-   HST 03/18/18 showed moderate OSA, AHI 15/hr - She was ordered to start auto CPAP but never received machine  - Reports symptoms of snoring and chocking sensation at night. Epworth 16/24 - She will need a repeat home sleep study to confirm dx sleep apnea - Discussed treatment options including weight loss, side sleeping position, oral appliance, CPAP or referral to ENT. We also reviewed risk of untreated sleep apnea including cardiac arrhthymias, stoke, pulmonary HTN, diabetes and increased risk for daytime accidents   - FU in 4 weeks to review HST

## 2020-07-24 NOTE — Progress Notes (Signed)
@Patient  ID: Madeline Chandler, female    DOB: [birth year], 60 y.o.   MRN: [id number]  Chief Complaint  Patient presents with  . Follow-up    Never got CPAP     Referring provider: Shelda Pal*  HPI: 59 year old female, never smoked.  Past medical history significant for hypertension, OSA, GERD, type 2 diabetes, breast cancer.  Patient of Dr. Elsworth Soho, last seen in office for initial consult on 02/19/18. HST 03/18/18 showed moderate OSA, AHI 15/hr. Started on Auto CPAP 5-15cm h20.   07/24/2020 - Interim hx Patient presents today for overall follow-up/OSA. Hx sleep apnea, hx in 2019 but never started on CPAP. She has been told by anesthesiologist that she stops breathing. Reports symptoms of snoring and occasional chocking sensation. She is not sleeping well at night, wakes up 30-60 mins after falling asleep. She falls asleep watching TV. She does not have an established bedtime. She gets out of bed at 6:30-7am. She has lost 20lbs since last sleep study. Denies sleep walking, sleep paralysis, parasomnias, cataplexy.   Allergies  Allergen Reactions  . Prednisone Other (See Comments)    headaches    Immunization History  Administered Date(s) Administered  . Influenza, Quadrivalent, Recombinant, Inj, Pf 06/03/2019  . Influenza,inj,Quad PF,6+ Mos 08/28/2013, 10/29/2016, 09/26/2017, 05/02/2018, 05/08/2020  . Moderna Sars-Covid-2 Vaccination 11/21/2019, 12/06/2019  . Pneumococcal Polysaccharide-23 01/26/2018  . Tdap 12/12/2013  . Zoster Recombinat (Shingrix) 05/08/2020, 07/08/2020    Past Medical History:  Diagnosis Date  . Ankle fracture 03/09/2019   Left  . Arthritis    "hips" (10/28/2016)  . Avascular necrosis of bones of both hips   . Bulging lumbar disc    2  . Cervical pain (neck)   . De Quervain's tenosynovitis, bilateral   . Ductal carcinoma in situ (DCIS) of right breast 1997; 2018   s/p chemotherapy and radiation treatment  . Essential hypertension    08/22/11:  BUN 15, creatinine 0.95, GFR 79     . Generalized anxiety disorder   . GERD (gastroesophageal reflux disease)   . History of colonic polyps   . History of migraine headaches    "none in the last few years; had them mostly in late 20's/mid 30's"  . History of therapeutic radiation   . Hypokalemia   . Insomnia   . Major depressive disorder   . Mild neurocognitive disorder, unclear etiology 04/24/2020  . OSA (obstructive sleep apnea) 02/19/2018   Awaiting CPAP machine as of 04/24/2020  . Restless leg syndrome   . Type 2 diabetes mellitus   . Upper respiratory infection 07/16/2010    Tobacco History: Social History   Tobacco Use  Smoking Status Never Smoker  Smokeless Tobacco Never Used   Counseling given: Not Answered   Outpatient Medications Prior to Visit  Medication Sig Dispense Refill  . amphetamine-dextroamphetamine (ADDERALL) 20 MG tablet Take 20 mg by mouth daily.  0  . atorvastatin (LIPITOR) 40 MG tablet TAKE ONE TABLET BY MOUTH DAILY 30 tablet 0  . carvedilol (COREG) 25 MG tablet TAKE 1 TABLET BY MOUTH TWICE A DAY WITH MEALS 180 tablet 1  . DULoxetine (CYMBALTA) 30 MG capsule TAKE ONE CAPSULE BY MOUTH DAILY 30 capsule 0  . glucose blood (ONETOUCH VERIO) test strip Use once daily to check blood sugar.  DX E11.9 100 each 1  . letrozole (FEMARA) 2.5 MG tablet TAKE ONE TABLET BY MOUTH DAILY 30 tablet 5  . losartan-hydrochlorothiazide (HYZAAR) 100-25 MG tablet TAKE ONE TABLET BY MOUTH DAILY  30 tablet 3  . methylPREDNISolone (MEDROL DOSEPAK) 4 MG TBPK tablet Follow instructions on package. 21 tablet 0  . ONETOUCH DELICA LANCETS FINE MISC Use as directed once daily to check blood sugar.  DXE11.9 100 each 1  . OZEMPIC, 1 MG/DOSE, 4 MG/3ML SOPN DIAL AND INJECT UNDER THE SKIN 1 MG WEEKLY 3 mL 2  . pantoprazole (PROTONIX) 40 MG tablet TAKE ONE TABLET BY MOUTH DAILY 30 tablet 0  . Semaglutide, 1 MG/DOSE, (OZEMPIC, 1 MG/DOSE,) 2 MG/1.5ML SOPN Inject 1 mg into the skin once a week. 3  mL 5  . traZODone (DESYREL) 50 MG tablet Take 0.5-1 tablets (25-50 mg total) by mouth at bedtime as needed for sleep. 30 tablet 3   No facility-administered medications prior to visit.   Review of Systems  Review of Systems  Constitutional: Negative.   HENT: Negative.   Respiratory: Negative.    Physical Exam  BP 122/82 (BP Location: Left Arm, Cuff Size: Large)   Pulse 84   Temp 97.6 F (36.4 C)   Ht 5' 5.5" (1.664 m)   Wt 210 lb 9.6 oz (95.5 kg)   SpO2 98%   BMI 34.51 kg/m  Physical Exam Constitutional:      Appearance: Normal appearance.  HENT:     Head: Normocephalic and atraumatic.     Mouth/Throat:     Mouth: Mucous membranes are moist.     Pharynx: Oropharynx is clear.     Comments: Mallampati class II-III Cardiovascular:     Rate and Rhythm: Normal rate and regular rhythm.     Comments: RRR Pulmonary:     Effort: Pulmonary effort is normal.     Breath sounds: Normal breath sounds.     Comments: CTA Musculoskeletal:        General: Normal range of motion.  Skin:    General: Skin is warm and dry.  Neurological:     General: No focal deficit present.     Mental Status: She is alert and oriented to person, place, and time. Mental status is at baseline.  Psychiatric:        Mood and Affect: Mood normal.        Behavior: Behavior normal.        Thought Content: Thought content normal.        Judgment: Judgment normal.      Lab Results:  CBC    Component Value Date/Time   WBC 5.9 03/28/2019 1139   RBC 4.21 03/28/2019 1139   HGB 12.0 03/28/2019 1139   HCT 37.9 03/28/2019 1139   PLT 368 03/28/2019 1139   MCV 90.0 03/28/2019 1139   MCH 28.5 03/28/2019 1139   MCHC 31.7 03/28/2019 1139   RDW 13.6 03/28/2019 1139   LYMPHSABS 2.2 03/09/2019 0634   MONOABS 0.8 03/09/2019 0634   EOSABS 0.1 03/09/2019 0634   BASOSABS 0.0 03/09/2019 0634    BMET    Component Value Date/Time   NA 138 02/04/2020 1440   K 3.5 02/04/2020 1440   CL 99 02/04/2020 1440    CO2 31 02/04/2020 1440   GLUCOSE 94 02/04/2020 1440   BUN 12 02/04/2020 1440   CREATININE 0.83 02/04/2020 1440   CREATININE 1.05 03/22/2019 1554   CALCIUM 10.4 02/04/2020 1440   GFRNONAA >60 10/28/2019 0956   GFRNONAA 71 12/12/2013 1528   GFRAA >60 10/28/2019 0956   GFRAA 82 12/12/2013 1528    BNP No results found for: BNP  ProBNP No results found for: PROBNP  Imaging: No results found.   Assessment & Plan:   OSA (obstructive sleep apnea) - HST 03/18/18 showed moderate OSA, AHI 15/hr - She was ordered to start auto CPAP but never received machine  - Reports symptoms of snoring and chocking sensation at night. Epworth 16/24 - She will need a repeat home sleep study to confirm dx sleep apnea - Discussed treatment options including weight loss, side sleeping position, oral appliance, CPAP or referral to ENT. We also reviewed risk of untreated sleep apnea including cardiac arrhthymias, stoke, pulmonary HTN, diabetes and increased risk for daytime accidents   - FU in 4 weeks to review [place], NP 07/24/2020

## 2020-08-04 ENCOUNTER — Telehealth: Payer: Self-pay

## 2020-08-04 NOTE — Telephone Encounter (Signed)
PA initiated via Covermymeds; KEY: [id number]. Awaiting determination.

## 2020-08-04 NOTE — Telephone Encounter (Signed)
PA approved.   Request Reference Number: [id number]. OZEMPIC INJ 2/1.5ML is approved through 08/04/2021. Your patient may now fill this prescription and it will be covered.

## 2020-08-21 ENCOUNTER — Ambulatory Visit (INDEPENDENT_AMBULATORY_CARE_PROVIDER_SITE_OTHER): Payer: 59 | Admitting: Primary Care

## 2020-08-21 ENCOUNTER — Telehealth: Payer: Self-pay

## 2020-08-21 ENCOUNTER — Other Ambulatory Visit: Payer: Self-pay

## 2020-08-21 DIAGNOSIS — G4733 Obstructive sleep apnea (adult) (pediatric): Secondary | ICD-10-CM

## 2020-08-21 NOTE — Telephone Encounter (Signed)
ATC pt for televisit x2. Left voice mail messages both times. Message states if pt does not return call to office before 4:45pm today pt will need to reschedule visit.

## 2020-08-23 ENCOUNTER — Other Ambulatory Visit: Payer: Self-pay | Admitting: Family Medicine

## 2020-08-23 DIAGNOSIS — F411 Generalized anxiety disorder: Secondary | ICD-10-CM

## 2020-08-23 DIAGNOSIS — K219 Gastro-esophageal reflux disease without esophagitis: Secondary | ICD-10-CM

## 2020-08-25 ENCOUNTER — Inpatient Hospital Stay: Payer: 59 | Attending: Oncology | Admitting: Oncology

## 2020-08-25 NOTE — Progress Notes (Signed)
Attempted to contact patient for televisit, No answer

## 2020-08-27 ENCOUNTER — Telehealth: Payer: Self-pay | Admitting: Oncology

## 2020-08-27 NOTE — Telephone Encounter (Signed)
lft the pt a vm to r/s her 1/18 appt

## 2020-09-07 ENCOUNTER — Other Ambulatory Visit: Payer: Self-pay

## 2020-09-07 ENCOUNTER — Ambulatory Visit: Payer: 59

## 2020-09-07 DIAGNOSIS — G4733 Obstructive sleep apnea (adult) (pediatric): Secondary | ICD-10-CM

## 2020-09-09 DIAGNOSIS — G4733 Obstructive sleep apnea (adult) (pediatric): Secondary | ICD-10-CM

## 2020-09-23 ENCOUNTER — Ambulatory Visit (INDEPENDENT_AMBULATORY_CARE_PROVIDER_SITE_OTHER): Payer: 59 | Admitting: Primary Care

## 2020-09-23 ENCOUNTER — Encounter: Payer: Self-pay | Admitting: Primary Care

## 2020-09-23 ENCOUNTER — Other Ambulatory Visit: Payer: Self-pay

## 2020-09-23 VITALS — BP 130/84 | HR 100 | Temp 97.2°F | Ht 66.0 in | Wt 206.0 lb

## 2020-09-23 DIAGNOSIS — G4733 Obstructive sleep apnea (adult) (pediatric): Secondary | ICD-10-CM

## 2020-09-23 NOTE — Patient Instructions (Addendum)
Pleasure seeing you today Madeline Chandler sleep study showed that you have mild obstructive sleep apnea, you had approximately six events an hour where you stopped breathing for 10 seconds or longer. Average oxygen level was 94% (lowest was 84%)  Treatment options include weight loss, side sleeping position, oral appliance, CPAP therapy referral to ear nose and throat  Referral: Dr. Katharine Look fuller [phone]  Follow-up 3-4 months with [facility] NP  Or notify us sooner if you decide you want to proceed with cpap    Sleep Apnea Sleep apnea affects breathing during sleep. It causes breathing to stop for a short time or to become shallow. It can also increase the risk of:  Heart attack.  Stroke.  Being very overweight (obese).  Diabetes.  Heart failure.  Irregular heartbeat. The goal of treatment is to help you breathe normally again. What are the causes? There are three kinds of sleep apnea:  Obstructive sleep apnea. This is caused by a blocked or collapsed airway.  Central sleep apnea. This happens when the brain does not send the right signals to the muscles that control breathing.  Mixed sleep apnea. This is a combination of obstructive and central sleep apnea. The most common cause of this condition is a collapsed or blocked airway. This can happen if:  Your throat muscles are too relaxed.  Your tongue and tonsils are too large.  You are overweight.  Your airway is too small.   What increases the risk?  Being overweight.  Smoking.  Having a small airway.  Being older.  Being female.  Drinking alcohol.  Taking medicines to calm yourself (sedatives or tranquilizers).  Having family members with the condition. What are the signs or symptoms?  Trouble staying asleep.  Being sleepy or tired during the day.  Getting angry a lot.  Loud snoring.  Headaches in the morning.  Not being able to focus your mind (concentrate).  Forgetting things.  Less  interest in sex.  Mood swings.  Personality changes.  Feelings of sadness (depression).  Waking up a lot during the night to pee (urinate).  Dry mouth.  Sore throat. How is this diagnosed?  Your medical history.  A physical exam.  A test that is done when you are sleeping (sleep study). The test is most often done in a sleep lab but may also be done at home. How is this treated?  Sleeping on your side.  Using a medicine to get rid of mucus in your nose (decongestant).  Avoiding the use of alcohol, medicines to help you relax, or certain pain medicines (narcotics).  Losing weight, if needed.  Changing your diet.  Not smoking.  Using a machine to open your airway while you sleep, such as: ? An oral appliance. This is a mouthpiece that shifts your lower jaw forward. ? A CPAP device. This device blows air through a mask when you breathe out (exhale). ? An EPAP device. This has valves that you put in each nostril. ? A BPAP device. This device blows air through a mask when you breathe in (inhale) and breathe out.  Having surgery if other treatments do not work. It is important to get treatment for sleep apnea. Without treatment, it can lead to:  High blood pressure.  Coronary artery disease.  In men, not being able to have an erection (impotence).  Reduced thinking ability.   Follow these instructions at home: Lifestyle  Make changes that your doctor recommends.  Eat a healthy diet.  Lose weight if needed.  Avoid alcohol, medicines to help you relax, and some pain medicines.  Do not use any products that contain nicotine or tobacco, such as cigarettes, e-cigarettes, and chewing tobacco. If you need help quitting, ask your doctor. General instructions  Take over-the-counter and prescription medicines only as told by your doctor.  If you were given a machine to use while you sleep, use it only as told by your doctor.  If you are having surgery, make sure to  tell your doctor you have sleep apnea. You may need to bring your device with you.  Keep all follow-up visits as told by your doctor. This is important. Contact a doctor if:  The machine that you were given to use during sleep bothers you or does not seem to be working.  You do not get better.  You get worse. Get help right away if:  Your chest hurts.  You have trouble breathing in enough air.  You have an uncomfortable feeling in your back, arms, or stomach.  You have trouble talking.  One side of your body feels weak.  A part of your face is hanging down. These symptoms may be an emergency. Do not wait to see if the symptoms will go away. Get medical help right away. Call your local emergency services (911 in the [place] Do not drive yourself to the hospital. Summary  This condition affects breathing during sleep.  The most common cause is a collapsed or blocked airway.  The goal of treatment is to help you breathe normally while you sleep. This information is not intended to replace advice given to you by your health care provider. Make sure you discuss any questions you have with your health care provider. Document Revised: 05/11/2018 Document Reviewed: 03/20/2018 Elsevier Patient Education  2021 [employer].    CPAP and BPAP Information CPAP and BPAP are methods that use air pressure to keep your airways open and to help you breathe well. CPAP and BPAP use different amounts of pressure. Your health care provider will tell you whether CPAP or BPAP would be more helpful for you.  CPAP stands for "continuous positive airway pressure." With CPAP, the amount of pressure stays the same while you breathe in and out.  BPAP stands for "bi-level positive airway pressure." With BPAP, the amount of pressure will be higher when you breathe in (inhale) and lower when you breathe out(exhale). This allows you to take larger breaths. CPAP or BPAP may be used in the hospital, or your  health care provider may want you to use it at home. You may need to have a sleep study before your health care provider can order a machine for you to use at home. Why are CPAP and BPAP treatments used? CPAP or BPAP can be helpful if you have:  Sleep apnea.  Chronic obstructive pulmonary disease (COPD).  Heart failure.  Medical conditions that cause muscle weakness, including muscular dystrophy or amyotrophic lateral sclerosis (ALS).  Other problems that cause breathing to be shallow, weak, abnormal, or difficult. CPAP and BPAP are most commonly used for obstructive sleep apnea (OSA) to keep the airways from collapsing when the muscles relax during sleep. How is CPAP or BPAP administered? Both CPAP and BPAP are provided by a small machine with a flexible plastic tube that attaches to a plastic mask that you wear. Air is blown through the mask into your nose or mouth. The amount of pressure that is used to blow the  air can be adjusted on the machine. Your health care provider will set the pressure setting and help you find the best mask for you. When should CPAP or BPAP be used? In most cases, the mask only needs to be worn during sleep. Generally, the mask needs to be worn throughout the night and during any daytime naps. People with certain medical conditions may also need to wear the mask at other times when they are awake. Follow instructions from your health care provider about when to use the machine. What are some tips for using the mask?  Because the mask needs to be snug, some people feel trapped or closed-in (claustrophobic) when first using the mask. If you feel this way, you may need to get used to the mask. One way to do this is to hold the mask loosely over your nose or mouth and then gradually apply the mask more snugly. You can also gradually increase the amount of time that you use the mask.  Masks are available in various types and sizes. If your mask does not fit well, talk  with your health care provider about getting a different one. Some common types of masks include: ? Full face masks, which fit over the mouth and nose. ? Nasal masks, which fit over the nose. ? Nasal pillow or prong masks, which fit into the nostrils.  If you are using a mask that fits over your nose and you tend to breathe through your mouth, a chin strap may be applied to help keep your mouth closed.  Some CPAP and BPAP machines have alarms that may sound if the mask comes off or develops a leak.  If you have trouble with the mask, it is very important that you talk with your health care provider about finding a way to make the mask easier to tolerate. Do not stop using the mask. There could be a negative impact to your health if you stop using the mask.   What are some tips for using the machine?  Place your CPAP or BPAP machine on a secure table or stand near an electrical outlet.  Know where the on/off switch is on the machine.  Follow instructions from your health care provider about how to set the pressure on your machine and when you should use it.  Do not eat or drink while the CPAP or BPAP machine is on. Food or fluids could get pushed into your lungs by the pressure of the CPAP or BPAP.  For home use, CPAP and BPAP machines can be rented or purchased through home health care companies. Many different brands of machines are available. Renting a machine before purchasing may help you find out which particular machine works well for you. Your insurance may also decide which machine you may get.  Keep the CPAP or BPAP machine and attachments clean. Ask your health care provider for specific instructions. Follow these instructions at home:  Do not use any products that contain nicotine or tobacco, such as cigarettes, e-cigarettes, and chewing tobacco. If you need help quitting, ask your health care provider.  Keep all follow-up visits as told by your health care provider. This is  important. Contact a health care provider if:  You have redness or pressure sores on your head, face, mouth, or nose from the mask or head gear.  You have trouble using the CPAP or BPAP machine.  You cannot tolerate wearing the CPAP or BPAP mask.  Someone tells you that  you snore even when wearing your CPAP or BPAP. Get help right away if:  You have trouble breathing.  You feel confused. Summary  CPAP and BPAP are methods that use air pressure to keep your airways open and to help you breathe well.  You may need to have a sleep study before your health care provider can order a machine for home use.  If you have trouble with the mask, it is very important that you talk with your health care provider about finding a way to make the mask easier to tolerate. Do not stop using the mask. There could be a negative impact to your health if you stop using the mask.  Follow instructions from your health care provider about when to use the machine. This information is not intended to replace advice given to you by your health care provider. Make sure you discuss any questions you have with your health care provider. Document Revised: 08/16/2019 Document Reviewed: 08/19/2019 Elsevier Patient Education  2021 [employer].

## 2020-09-23 NOTE — Assessment & Plan Note (Addendum)
-   Hx Sleep apnea dx in 2019, never started on CPAP. HST 09/07/20 showed mild OSA; AHI 6.3, SpO2 low 84% (average 94%) - Discussed treatment options including weight loss, side sleeping position, oral appliance, CPAP or referral to ENT - Patient would like referral for oral appliance with orthodontics Dr. Toy Cookey  - Patient will continue to work on weight loss, goal BMI is <30 (she has already lost 20 lbs) - Encourage she establish a normal bedtime routine and limit blue light at night  - FU in 4 months or sooner if needed/ would like to proceed with CPAP therapy

## 2020-09-23 NOTE — Progress Notes (Signed)
@Patient  ID: Madeline Chandler, female    DOB: 09/11/1960, 60 y.o.   MRN: [id number]  Chief Complaint  Patient presents with  . Follow-up    No complaints. Sleep results.    Referring provider: Shelda Pal*  HPI: 60 year old female, never smoked.  Past medical history significant for hypertension, OSA, GERD, type 2 diabetes, breast cancer, generalized anxiety disorder, obesity.  Previous [facility] pulmonary encounter: 07/24/2020 Patient presents today for overall follow-up/OSA. Hx sleep apnea, hx in 2019 but never started on CPAP. She has been told by anesthesiologist that she stops breathing. Reports symptoms of snoring and occasional chocking sensation. She is not sleeping well at night, wakes up 30-60 mins after falling asleep. She falls asleep watching TV. She does not have an established bedtime. She gets out of bed at 6:30-7am. She has lost 20lbs since last sleep study. Denies sleep walking, sleep paralysis, parasomnias, cataplexy.  09/23/2020 - Interim hx Patient presents today to review sleep study results. She is doing well. No complaints. Home sleep study on 09/07/2020 showed mild obstructive sleep apnea, AHI 6.3 with SPO2 low 84%. She has a past history of sleep apnea diagnosed in 2019 but never started on CPAP.  Reports symptoms of snoring, choking sensation, restless sleep.  She does not have an established bedtime. She falls asleep watching TV. Her poor sleep does effect her job.   Allergies  Allergen Reactions  . Prednisone Other (See Comments)    headaches    Immunization History  Administered Date(s) Administered  . Influenza, Quadrivalent, Recombinant, Inj, Pf 06/03/2019  . Influenza,inj,Quad PF,6+ Mos 08/28/2013, 10/29/2016, 09/26/2017, 05/02/2018, 05/08/2020  . Moderna Sars-Covid-2 Vaccination 11/21/2019, 12/06/2019  . Pneumococcal Polysaccharide-23 01/26/2018  . Tdap 12/12/2013  . Zoster Recombinat (Shingrix) 05/08/2020, 07/08/2020    Past Medical History:   Diagnosis Date  . Ankle fracture 03/09/2019   Left  . Arthritis    "hips" (10/28/2016)  . Avascular necrosis of bones of both hips   . Bulging lumbar disc    2  . Cervical pain (neck)   . De Quervain's tenosynovitis, bilateral   . Ductal carcinoma in situ (DCIS) of right breast 1997; 2018   s/p chemotherapy and radiation treatment  . Essential hypertension    08/22/11: BUN 15, creatinine 0.95, GFR 79     . Generalized anxiety disorder   . GERD (gastroesophageal reflux disease)   . History of colonic polyps   . History of migraine headaches    "none in the last few years; had them mostly in late 20's/mid 30's"  . History of therapeutic radiation   . Hypokalemia   . Insomnia   . Major depressive disorder   . Mild neurocognitive disorder, unclear etiology 04/24/2020  . OSA (obstructive sleep apnea) 02/19/2018   Awaiting CPAP machine as of 04/24/2020  . Restless leg syndrome   . Type 2 diabetes mellitus   . Upper respiratory infection 07/16/2010    Tobacco History: Social History   Tobacco Use  Smoking Status Never Smoker  Smokeless Tobacco Never Used   Counseling given: Not Answered   Outpatient Medications Prior to Visit  Medication Sig Dispense Refill  . amphetamine-dextroamphetamine (ADDERALL) 20 MG tablet Take 20 mg by mouth daily.  0  . atorvastatin (LIPITOR) 40 MG tablet TAKE ONE TABLET BY MOUTH DAILY 30 tablet 0  . carvedilol (COREG) 25 MG tablet TAKE 1 TABLET BY MOUTH TWICE A DAY WITH MEALS 180 tablet 1  . DULoxetine (CYMBALTA) 30 MG capsule TAKE ONE  CAPSULE BY MOUTH DAILY 30 capsule 0  . glucose blood (ONETOUCH VERIO) test strip Use once daily to check blood sugar.  DX E11.9 100 each 1  . letrozole (FEMARA) 2.5 MG tablet TAKE ONE TABLET BY MOUTH DAILY 30 tablet 5  . losartan-hydrochlorothiazide (HYZAAR) 100-25 MG tablet TAKE ONE TABLET BY MOUTH DAILY 30 tablet 3  . methylPREDNISolone (MEDROL DOSEPAK) 4 MG TBPK tablet Follow instructions on package. 21 tablet 0   . ONETOUCH DELICA LANCETS FINE MISC Use as directed once daily to check blood sugar.  DXE11.9 100 each 1  . OZEMPIC, 1 MG/DOSE, 4 MG/3ML SOPN DIAL AND INJECT UNDER THE SKIN 1 MG WEEKLY 3 mL 2  . pantoprazole (PROTONIX) 40 MG tablet TAKE ONE TABLET BY MOUTH DAILY 30 tablet 0  . Semaglutide, 1 MG/DOSE, (OZEMPIC, 1 MG/DOSE,) 2 MG/1.5ML SOPN Inject 1 mg into the skin once a week. 3 mL 5  . traZODone (DESYREL) 50 MG tablet Take 0.5-1 tablets (25-50 mg total) by mouth at bedtime as needed for sleep. 30 tablet 3   No facility-administered medications prior to visit.   Review of Systems  Review of Systems  Constitutional: Positive for fatigue.  Respiratory: Negative.   Psychiatric/Behavioral: Positive for sleep disturbance.   Physical Exam  BP 130/84 (BP Location: Left Arm, Cuff Size: Normal)   Pulse 100   Temp (!) 97.2 F (36.2 C)   Ht 5\' 6"  (1.676 m)   Wt 206 lb (93.4 kg)   SpO2 97%   BMI 33.25 kg/m  Physical Exam Constitutional:      Appearance: Normal appearance.  HENT:     Mouth/Throat:     Comments: Deferred d/t masking Cardiovascular:     Rate and Rhythm: Normal rate and regular rhythm.  Pulmonary:     Effort: Pulmonary effort is normal.     Breath sounds: Normal breath sounds.  Musculoskeletal:        General: Normal range of motion.  Skin:    General: Skin is warm and dry.  Neurological:     General: No focal deficit present.     Mental Status: She is alert and oriented to person, place, and time. Mental status is at baseline.  Psychiatric:        Mood and Affect: Mood normal.        Behavior: Behavior normal.        Thought Content: Thought content normal.      Lab Results:  CBC    Component Value Date/Time   WBC 5.9 03/28/2019 1139   RBC 4.21 03/28/2019 1139   HGB 12.0 03/28/2019 1139   HCT 37.9 03/28/2019 1139   PLT 368 03/28/2019 1139   MCV 90.0 03/28/2019 1139   MCH 28.5 03/28/2019 1139   MCHC 31.7 03/28/2019 1139   RDW 13.6 03/28/2019 1139    LYMPHSABS 2.2 03/09/2019 0634   MONOABS 0.8 03/09/2019 0634   EOSABS 0.1 03/09/2019 0634   BASOSABS 0.0 03/09/2019 0634    BMET    Component Value Date/Time   NA 138 02/04/2020 1440   K 3.5 02/04/2020 1440   CL 99 02/04/2020 1440   CO2 31 02/04/2020 1440   GLUCOSE 94 02/04/2020 1440   BUN 12 02/04/2020 1440   CREATININE 0.83 02/04/2020 1440   CREATININE 1.05 03/22/2019 1554   CALCIUM 10.4 02/04/2020 1440   GFRNONAA >60 10/28/2019 0956   GFRNONAA 71 12/12/2013 1528   GFRAA >60 10/28/2019 0956   GFRAA 82 12/12/2013 1528  BNP No results found for: BNP  ProBNP No results found for: PROBNP  Imaging: No results found.   Assessment & Plan:   OSA (obstructive sleep apnea) - Hx Sleep apnea dx in 2019, never started on CPAP. HST 09/07/20 showed mild OSA; AHI 6.3, SpO2 low 84% (average 94%) - Discussed treatment options including weight loss, side sleeping position, oral appliance, CPAP or referral to ENT - Patient would like referral for oral appliance with orthodontics Dr. Toy Cookey  - Patient will continue to work on weight loss, goal BMI is <30 (she has already lost 20 lbs) - Encourage she establish a normal bedtime routine and limit blue light at night  - FU in 4 months or sooner if needed/ would like to proceed with CPAP therapy    Martyn Ehrich, NP 09/23/2020

## 2020-09-23 NOTE — Progress Notes (Signed)
Reviewed and agree with assessment/plan.   Chesley Mires, MD [facility] Pulmonary/Critical Care 09/23/2020, 5:09 PM Pager:  [phone]

## 2020-09-24 ENCOUNTER — Other Ambulatory Visit: Payer: Self-pay | Admitting: Family Medicine

## 2020-09-24 DIAGNOSIS — K219 Gastro-esophageal reflux disease without esophagitis: Secondary | ICD-10-CM

## 2020-09-24 DIAGNOSIS — F411 Generalized anxiety disorder: Secondary | ICD-10-CM

## 2020-10-05 ENCOUNTER — Other Ambulatory Visit: Payer: Self-pay | Admitting: Family Medicine

## 2020-10-21 ENCOUNTER — Other Ambulatory Visit: Payer: Self-pay | Admitting: Family Medicine

## 2020-10-21 DIAGNOSIS — F411 Generalized anxiety disorder: Secondary | ICD-10-CM

## 2020-10-21 DIAGNOSIS — K219 Gastro-esophageal reflux disease without esophagitis: Secondary | ICD-10-CM

## 2020-11-01 ENCOUNTER — Other Ambulatory Visit: Payer: Self-pay | Admitting: Family Medicine

## 2020-11-01 DIAGNOSIS — I10 Essential (primary) hypertension: Secondary | ICD-10-CM

## 2020-12-19 ENCOUNTER — Other Ambulatory Visit: Payer: Self-pay | Admitting: Oncology

## 2020-12-19 ENCOUNTER — Other Ambulatory Visit: Payer: Self-pay | Admitting: Family Medicine

## 2020-12-19 DIAGNOSIS — G4700 Insomnia, unspecified: Secondary | ICD-10-CM

## 2020-12-19 DIAGNOSIS — C50211 Malignant neoplasm of upper-inner quadrant of right female breast: Secondary | ICD-10-CM

## 2020-12-19 DIAGNOSIS — Z170 Estrogen receptor positive status [ER+]: Secondary | ICD-10-CM

## 2020-12-21 ENCOUNTER — Telehealth: Payer: Self-pay | Admitting: Oncology

## 2020-12-21 ENCOUNTER — Encounter: Payer: Self-pay | Admitting: *Deleted

## 2020-12-21 NOTE — Telephone Encounter (Signed)
Called patient per 5/16 sch msg to set up yearly f/u . No answer. Left message for patient to call back to schedule appt.

## 2020-12-21 NOTE — Progress Notes (Signed)
Refill request for Letrozole per pharmacy. Patient was "no show" for her 1 year F/U in January. Refilled x 1 with message needs f/u appointment for continued refills. Sent scheduling message.

## 2020-12-31 ENCOUNTER — Other Ambulatory Visit: Payer: Self-pay | Admitting: Family Medicine

## 2021-01-18 ENCOUNTER — Encounter: Payer: Self-pay | Admitting: Family Medicine

## 2021-01-18 ENCOUNTER — Other Ambulatory Visit: Payer: Self-pay

## 2021-01-18 ENCOUNTER — Ambulatory Visit (INDEPENDENT_AMBULATORY_CARE_PROVIDER_SITE_OTHER): Payer: 59 | Admitting: Family Medicine

## 2021-01-18 VITALS — BP 148/98 | HR 88 | Temp 98.6°F | Ht 65.5 in | Wt 209.1 lb

## 2021-01-18 DIAGNOSIS — Z0000 Encounter for general adult medical examination without abnormal findings: Secondary | ICD-10-CM

## 2021-01-18 DIAGNOSIS — E1169 Type 2 diabetes mellitus with other specified complication: Secondary | ICD-10-CM | POA: Diagnosis not present

## 2021-01-18 DIAGNOSIS — E669 Obesity, unspecified: Secondary | ICD-10-CM

## 2021-01-18 DIAGNOSIS — Z0001 Encounter for general adult medical examination with abnormal findings: Secondary | ICD-10-CM

## 2021-01-18 DIAGNOSIS — F331 Major depressive disorder, recurrent, moderate: Secondary | ICD-10-CM | POA: Diagnosis not present

## 2021-01-18 DIAGNOSIS — R2689 Other abnormalities of gait and mobility: Secondary | ICD-10-CM

## 2021-01-18 DIAGNOSIS — K219 Gastro-esophageal reflux disease without esophagitis: Secondary | ICD-10-CM

## 2021-01-18 DIAGNOSIS — I10 Essential (primary) hypertension: Secondary | ICD-10-CM

## 2021-01-18 DIAGNOSIS — G4700 Insomnia, unspecified: Secondary | ICD-10-CM | POA: Diagnosis not present

## 2021-01-18 DIAGNOSIS — F411 Generalized anxiety disorder: Secondary | ICD-10-CM

## 2021-01-18 LAB — COMPREHENSIVE METABOLIC PANEL
ALT: 24 U/L (ref 0–35)
AST: 19 U/L (ref 0–37)
Albumin: 4.1 g/dL (ref 3.5–5.2)
Alkaline Phosphatase: 110 U/L (ref 39–117)
BUN: 11 mg/dL (ref 6–23)
CO2: 31 mEq/L (ref 19–32)
Calcium: 10.3 mg/dL (ref 8.4–10.5)
Chloride: 104 mEq/L (ref 96–112)
Creatinine, Ser: 0.89 mg/dL (ref 0.40–1.20)
GFR: 70.22 mL/min (ref >60.00)
Glucose, Bld: 86 mg/dL (ref 70–99)
Potassium: 4 mEq/L (ref 3.5–5.1)
Sodium: 142 mEq/L (ref 135–145)
Total Bilirubin: 0.6 mg/dL (ref 0.2–1.2)
Total Protein: 7.5 g/dL (ref 6.0–8.3)

## 2021-01-18 LAB — CBC
HCT: 39.9 % (ref 36.0–46.0)
Hemoglobin: 13.1 g/dL (ref 12.0–15.0)
MCHC: 32.8 g/dL (ref 30.0–36.0)
MCV: 88.3 fl (ref 78.0–100.0)
Platelets: 217 10*3/uL (ref 150.0–400.0)
RBC: 4.52 Mil/uL (ref 3.87–5.11)
RDW: 14.7 % (ref 11.5–15.5)
WBC: 5.5 10*3/uL (ref 4.0–10.5)

## 2021-01-18 LAB — LIPID PANEL
Cholesterol: 143 mg/dL (ref 0–200)
HDL: 52.5 mg/dL (ref >39.00)
LDL Cholesterol: 78 mg/dL (ref 0–99)
NonHDL: 90.65
Total CHOL/HDL Ratio: 3
Triglycerides: 64 mg/dL (ref 0.0–149.0)
VLDL: 12.8 mg/dL (ref 0.0–40.0)

## 2021-01-18 LAB — HEMOGLOBIN A1C: Hgb A1c MFr Bld: 6.4 % (ref 4.6–6.5)

## 2021-01-18 LAB — MICROALBUMIN / CREATININE URINE RATIO
Creatinine,U: 123.3 mg/dL
Microalb Creat Ratio: 1.5 mg/g (ref 0.0–30.0)
Microalb, Ur: 1.8 mg/dL (ref 0.0–1.9)

## 2021-01-18 MED ORDER — DULOXETINE HCL 30 MG PO CPEP: 30.0000 mg | ORAL_CAPSULE | Freq: Every day | ORAL | 2 refills | Status: DC

## 2021-01-18 MED ORDER — ATORVASTATIN CALCIUM 40 MG PO TABS: 40.0000 mg | ORAL_TABLET | Freq: Every day | ORAL | 2 refills | Status: DC

## 2021-01-18 MED ORDER — LOSARTAN POTASSIUM-HCTZ 100-25 MG PO TABS: 1.0000 | ORAL_TABLET | Freq: Every day | ORAL | 2 refills | Status: DC

## 2021-01-18 MED ORDER — TRAZODONE HCL 50 MG PO TABS: 25.0000 mg | ORAL_TABLET | Freq: Every evening | ORAL | 0 refills | Status: DC | PRN

## 2021-01-18 MED ORDER — OMEPRAZOLE 40 MG PO CPDR: 40.0000 mg | DELAYED_RELEASE_CAPSULE | Freq: Every day | ORAL | 1 refills | Status: DC

## 2021-01-18 MED ORDER — CARVEDILOL 25 MG PO TABS: 25.0000 mg | ORAL_TABLET | Freq: Two times a day (BID) | ORAL | 2 refills | Status: DC

## 2021-01-18 NOTE — Patient Instructions (Addendum)
Give [place] 2-3 business days to get the results of your labs back.   Keep the diet clean and stay active.  OK to use Debrox (peroxide) in the ear to loosen up wax. Also recommend using a bulb syringe (for removing boogers from baby's noses) to flush through warm water and vinegar (3-4:1 ratio). An alternative, though more expensive, is an elephant ear washer wax removal kit. Do not use Q-tips as this can impact wax further.  Keep an eye on your blood pressure at home. I want it <140/90 consistently. If greater than this back on your medicine, I want to see you in the office in around a month.   Let us know if you need anything.

## 2021-01-18 NOTE — Progress Notes (Signed)
Chief Complaint  Patient presents with   Follow-up   Anxiety    Hypertension      Well Woman Madeline Chandler is here for a complete physical.   Her last physical was >1 year ago.  Current diet: in general, a "healthy" diet. Current exercise: none Weight is stable and she denies fatigue out of ordinary. Seatbelt? Yes  Health Maintenance Mammogram- Yes Colon cancer screening-Yes Shingrix- Yes Tetanus- Yes Hep C screening- Yes HIV screening- Yes  Hypertension Patient presents for hypertension follow up. She does monitor home blood pressures. Blood pressures ranging on average from 140-170's/80's. She is compliant with Hyzaar 100-25 mg daily, has been out of Coreg.. Patient has these side effects of medication: none Diet and exercise as above.  Balance issues Over the past several months, the patient has had balance issues.  She started walking and veer off into a wall.  She is not having any weakness and has not fallen.  She does not feel lightheaded or that she is spinning.  She broke her left ankle a little over a year ago and has been having issues since then.  She underwent physical therapy though it did not help with her balance issue.  Past Medical History:  Diagnosis Date   Ankle fracture 03/09/2019   Left   Arthritis    "hips" (10/28/2016)   Avascular necrosis of bones of both hips    Bulging lumbar disc    2   Cervical pain (neck)    De Quervain's tenosynovitis, bilateral    Ductal carcinoma in situ (DCIS) of right breast 1997; 2018   s/p chemotherapy and radiation treatment   Essential hypertension    08/22/11: BUN 15, creatinine 0.95, GFR 79      Generalized anxiety disorder    GERD (gastroesophageal reflux disease)    History of colonic polyps    History of migraine headaches    "none in the last few years; had them mostly in late 20's/mid 30's"   History of therapeutic radiation    Hypokalemia    Insomnia    Major depressive disorder    Mild  neurocognitive disorder, unclear etiology 04/24/2020   OSA (obstructive sleep apnea) 02/19/2018   Awaiting CPAP machine as of 04/24/2020   Restless leg syndrome    Type 2 diabetes mellitus    Upper respiratory infection 07/16/2010     Past Surgical History:  Procedure Laterality Date   ABDOMINAL HYSTERECTOMY  2000s   ANTERIOR CERVICAL DECOMP/DISCECTOMY FUSION N/A 12/02/2015   Procedure: ANTERIOR CERVICAL DECOMPRESSION/DISCECTOMY FUSION C4 - C7  3 LEVELS;  Surgeon: Melina Schools, MD;  Location: [place];  Service: Orthopedics;  Laterality: N/A;   BACK SURGERY     BREAST BIOPSY Right 1997; 08/2016   BREAST LUMPECTOMY Right 1997   BREAST RECONSTRUCTION Right 07/07/2017   Procedure: RIGHT NIPPLE AEROLA RECONSTRUCTION WITH LOCAL FLAP FROM RIGHT GROIN;  Surgeon: Irene Limbo, MD;  Location: [place];  Service: Plastics;  Laterality: Right;   BREAST REDUCTION SURGERY Left 03/07/2017   Procedure: LEFT BREAST REDUCTION FOR SYMMETRY;  Surgeon: Irene Limbo, MD;  Location: [place];  Service: Plastics;  Laterality: Left;   COLONOSCOPY W/ POLYPECTOMY  2010   Dr Collene Mares   DILATION AND CURETTAGE OF UTERUS  1990s   HARDWARE REMOVAL Left 10/31/2019   Procedure: LEFT LEG MEDIAL HARDWARE REMOVAL;  Surgeon: Erle Crocker, MD;  Location: [place];  Service: Orthopedics;  Laterality: Left;  PROCEDURE:  LEFT LEG MEDIAL HARDWARE REMOVAL LENGTH OF SURGERY: 1 HOUR   JOINT REPLACEMENT     LATISSIMUS FLAP TO BREAST Right 10/28/2016   Procedure: LATISSIMUS FLAP TO BREAST WITH PLACEMENT OF TISSUE EXPANDER ON THE RIGHT;  Surgeon: Irene Limbo, MD;  Location: [place];  Service: Plastics;  Laterality: Right;  RFNA   MASTECTOMY Right 10/28/2016   RECONSTRUCTION BREAST W/ LATISSIMUS DORSI FLAP Right 10/28/2016    WITH PLACEMENT OF TISSUE EXPANDER    REMOVAL OF BILATERAL TISSUE EXPANDERS WITH PLACEMENT OF BILATERAL BREAST IMPLANTS Right 03/07/2017   Procedure:  REMOVAL OF RIGHT BREAST TISSUE EXPANDER WITH PLACEMENT OF RIGHT BREAST IMPLANT;  Surgeon: Irene Limbo, MD;  Location: [place];  Service: Plastics;  Laterality: Right;   TIBIA IM NAIL INSERTION Left 03/09/2019   Procedure: INTRAMEDULLARY (IM) NAIL TIBIAL;  Surgeon: Erle Crocker, MD;  Location: [place];  Service: Orthopedics;  Laterality: Left;   TISSUE EXPANDER PLACEMENT Right 10/28/2016   Procedure: TISSUE EXPANDER;  Surgeon: Irene Limbo, MD;  Location: [place];  Service: Plastics;  Laterality: Right;   TOTAL HIP ARTHROPLASTY Right 05/12/2015   Procedure: RIGHT TOTAL HIP ARTHROPLASTY ANTERIOR APPROACH;  Surgeon: Paralee Cancel, MD;  Location: [facility];  Service: Orthopedics;  Laterality: Right;   TOTAL HIP ARTHROPLASTY Left 06/23/2015   Procedure: LEFT TOTAL HIP ARTHROPLASTY ANTERIOR APPROACH;  Surgeon: Paralee Cancel, MD;  Location: [facility];  Service: Orthopedics;  Laterality: Left;   TOTAL MASTECTOMY Right 10/28/2016   Procedure: RIGHT MASTECTOMY;  Surgeon: Alphonsa Overall, MD;  Location: [place];  Service: General;  Laterality: Right;    Medications  Current Outpatient Medications on File Prior to Visit  Medication Sig Dispense Refill   amphetamine-dextroamphetamine (ADDERALL) 20 MG tablet Take 20 mg by mouth daily.  0   atorvastatin (LIPITOR) 40 MG tablet Take 1 tablet (40 mg total) by mouth daily. 30 tablet 0   carvedilol (COREG) 25 MG tablet TAKE 1 TABLET BY MOUTH TWICE A DAY WITH MEALS 180 tablet 1   DULoxetine (CYMBALTA) 30 MG capsule Take 1 capsule (30 mg total) by mouth daily. 30 capsule 0   glucose blood (ONETOUCH VERIO) test strip Use once daily to check blood sugar.  DX E11.9 100 each 1   letrozole (FEMARA) 2.5 MG tablet Take 1 tablet (2.5 mg total) by mouth daily. MUST SCHEDULE OFFICE VISIT FOR CONTINUED REFILLS 30 tablet 0   losartan-hydrochlorothiazide (HYZAAR) 100-25 MG tablet TAKE ONE TABLET BY MOUTH DAILY 30 tablet 3   ONETOUCH DELICA LANCETS FINE MISC Use as  directed once daily to check blood sugar.  DXE11.9 100 each 1   OZEMPIC, 1 MG/DOSE, 4 MG/3ML SOPN DIAL AND INJECT 1 MG UNDER THE SKIN ONCE WEEKLY 3 mL 0   pantoprazole (PROTONIX) 40 MG tablet Take 1 tablet (40 mg total) by mouth daily. 30 tablet 0   traZODone (DESYREL) 50 MG tablet Take 0.5-1 tablets (25-50 mg total) by mouth at bedtime as needed for sleep. 30 tablet 0    Allergies Allergies  Allergen Reactions   Prednisone Other (See Comments)    headaches    Review of Systems: Constitutional:  no unexpected weight changes Eye:  no recent significant change in vision Ear/Nose/Mouth/Throat:  Ears:  no recent change in hearing Nose/Mouth/Throat:  no complaints of nasal congestion, no sore throat Cardiovascular: no chest pain Respiratory:  no shortness of breath Gastrointestinal:  no abdominal pain, no change in bowel habits GU:  Female: negative for dysuria or pelvic pain  Musculoskeletal/Extremities:  no pain of the joints Integumentary (Skin/Breast):  no abnormal skin lesions reported Neurologic: + Balance issues as above Endocrine:  denies fatigue  Exam BP (!) 148/98 (BP Location: Left Arm, Patient Position: Sitting, Cuff Size: Large)   Pulse 88   Temp 98.6 F (37 C) (Oral)   Ht 5' 5.5" (1.664 m)   Wt 209 lb 2 oz (94.9 kg)   SpO2 99%   BMI 34.27 kg/m  General:  well developed, well nourished, in no apparent distress Skin:  no significant moles, warts, or growths Head:  no masses, lesions, or tenderness Eyes:  pupils equal and round, sclera anicteric without injection Ears:  canals without lesions, TMs shiny without retraction, no obvious effusion, no erythema Nose:  nares patent, septum midline, mucosa normal, and no drainage or sinus tenderness Throat/Pharynx:  lips and gingiva without lesion; tongue and uvula midline; non-inflamed pharynx; no exudates or postnasal drainage Neck: neck supple without adenopathy, thyromegaly, or masses Lungs:  clear to auscultation,  breath sounds equal bilaterally, no respiratory distress Cardio:  regular rate and rhythm, no LE edema Abdomen:  abdomen soft, nontender; bowel sounds normal; no masses or organomegaly Genital: Defer to GYN Musculoskeletal:  symmetrical muscle groups noted without atrophy or deformity Extremities:  no clubbing, cyanosis, or edema, no deformities, no skin discoloration Neuro:  gait normal during my exam; deep tendon reflexes normal and symmetric Psych: well oriented with normal range of affect and appropriate judgment/insight  Assessment and Plan  Well adult exam  Diabetes mellitus type 2 in obese ([place]) - Plan: CBC, Comprehensive metabolic panel, Hemoglobin A1c, Lipid panel, Microalbumin / creatinine urine ratio  Moderate episode of recurrent major depressive disorder (HCC)  Insomnia, unspecified type - Plan: traZODone (DESYREL) 50 MG tablet  Essential hypertension - Plan: losartan-hydrochlorothiazide (HYZAAR) 100-25 MG tablet  Anxiety state - Plan: DULoxetine (CYMBALTA) 30 MG capsule  Balance problem - Plan: Ambulatory referral to Neurology   Well 60 y.o. female. Counseled on diet and exercise. Other orders as above. Hypertension: Chronic, uncontrolled.  Continue Hyzaar 100-25 mg daily.  Add back Coreg 25 mg twice daily. Balance issues: Chronic, uncontrolled.  She has failed physical therapy.  Will refer to neurology. GERD: Chronic, uncontrolled.  Change Protonix to Prilosec 40 mg daily.  If no improvement the next month, will have her reach out to [place] and we will refer to gastroenterology. Follow up 6 mo. The patient voiced understanding and agreement to the plan.  [place], DO 01/18/21 11:46 AM

## 2021-01-19 ENCOUNTER — Telehealth: Payer: Self-pay | Admitting: *Deleted

## 2021-01-19 ENCOUNTER — Other Ambulatory Visit: Payer: Self-pay | Admitting: Family Medicine

## 2021-01-19 ENCOUNTER — Other Ambulatory Visit: Payer: Self-pay | Admitting: Oncology

## 2021-01-19 DIAGNOSIS — C50211 Malignant neoplasm of upper-inner quadrant of right female breast: Secondary | ICD-10-CM

## 2021-01-19 DIAGNOSIS — G4700 Insomnia, unspecified: Secondary | ICD-10-CM

## 2021-01-19 DIAGNOSIS — Z170 Estrogen receptor positive status [ER+]: Secondary | ICD-10-CM

## 2021-01-19 NOTE — Telephone Encounter (Signed)
Received refill request for Letrozole 2.5 mg. Last appointment 08/26/2019--past due 1 year follow up and message to make appointment placed on each 30 day script. Called and left VM for patient that she needs to schedule appointment. Also sent Mychart message.

## 2021-01-25 ENCOUNTER — Telehealth: Payer: Self-pay | Admitting: Family Medicine

## 2021-01-25 MED ORDER — ONETOUCH VERIO VI STRP: ORAL_STRIP | 1 refills | Status: AC

## 2021-01-25 NOTE — Telephone Encounter (Signed)
Pt, called to give name of glucose test strip Verio flex   Please advice

## 2021-01-25 NOTE — Telephone Encounter (Signed)
Called the patient informed strip sent in.

## 2021-02-03 ENCOUNTER — Other Ambulatory Visit: Payer: Self-pay | Admitting: Family Medicine

## 2021-02-05 ENCOUNTER — Ambulatory Visit: Payer: 59 | Admitting: Family Medicine

## 2021-02-13 ENCOUNTER — Other Ambulatory Visit: Payer: Self-pay | Admitting: Family Medicine

## 2021-02-13 DIAGNOSIS — G4700 Insomnia, unspecified: Secondary | ICD-10-CM

## 2021-02-15 NOTE — Telephone Encounter (Signed)
Last RF 01/18/2021

## 2021-03-01 ENCOUNTER — Other Ambulatory Visit: Payer: Self-pay | Admitting: Family Medicine

## 2021-03-12 ENCOUNTER — Telehealth: Payer: Self-pay | Admitting: Family Medicine

## 2021-03-12 NOTE — Telephone Encounter (Signed)
Pt states she would like to know from Dr. Nani Ravens if he recommends her to get the 2nd vaccine. States she is surprised that he didn't mention it to her when she had her CPE.

## 2021-03-12 NOTE — Telephone Encounter (Signed)
Her first vaccine was 11/21/19 and 2nd 12/06/19 She has had one booster on 10/17/2020

## 2021-03-12 NOTE — Telephone Encounter (Signed)
She had 2 booster's already. I would rec she wait until the updated booster shot comes out before proceeding for another. Ty.

## 2021-03-12 NOTE — Telephone Encounter (Signed)
Patient informed of PCP instructions. 

## 2021-03-12 NOTE — Telephone Encounter (Signed)
My mistake. I would still wait until Sept at this point. She was not 4 months out from her 1st booster at the time of the physical so that's probably why I didn't bring it up. Ty.

## 2021-03-15 ENCOUNTER — Other Ambulatory Visit: Payer: Self-pay | Admitting: Family Medicine

## 2021-04-02 ENCOUNTER — Other Ambulatory Visit: Payer: Self-pay | Admitting: Family Medicine

## 2021-05-07 ENCOUNTER — Other Ambulatory Visit: Payer: Self-pay | Admitting: Family Medicine

## 2021-05-14 ENCOUNTER — Other Ambulatory Visit: Payer: Self-pay | Admitting: Family Medicine

## 2021-07-06 ENCOUNTER — Telehealth: Payer: Self-pay | Admitting: Family Medicine

## 2021-07-06 NOTE — Telephone Encounter (Signed)
Initiated PA for Ozempic KEY: [facility] Waiting results from covermymeds.

## 2021-07-07 NOTE — Telephone Encounter (Signed)
PA approved Patient / Pharmacy informed.

## 2021-07-12 ENCOUNTER — Other Ambulatory Visit: Payer: Self-pay | Admitting: Family Medicine

## 2021-07-20 ENCOUNTER — Ambulatory Visit: Payer: 59 | Admitting: Family Medicine

## 2021-08-04 ENCOUNTER — Ambulatory Visit (INDEPENDENT_AMBULATORY_CARE_PROVIDER_SITE_OTHER): Payer: 59 | Admitting: Family Medicine

## 2021-08-04 ENCOUNTER — Encounter: Payer: Self-pay | Admitting: Family Medicine

## 2021-08-04 VITALS — BP 136/84 | HR 83 | Temp 98.3°F | Ht 65.5 in | Wt 205.0 lb

## 2021-08-04 DIAGNOSIS — F411 Generalized anxiety disorder: Secondary | ICD-10-CM | POA: Diagnosis not present

## 2021-08-04 DIAGNOSIS — Z23 Encounter for immunization: Secondary | ICD-10-CM | POA: Diagnosis not present

## 2021-08-04 DIAGNOSIS — I10 Essential (primary) hypertension: Secondary | ICD-10-CM | POA: Diagnosis not present

## 2021-08-04 DIAGNOSIS — E669 Obesity, unspecified: Secondary | ICD-10-CM | POA: Diagnosis not present

## 2021-08-04 DIAGNOSIS — C50211 Malignant neoplasm of upper-inner quadrant of right female breast: Secondary | ICD-10-CM

## 2021-08-04 DIAGNOSIS — E1169 Type 2 diabetes mellitus with other specified complication: Secondary | ICD-10-CM | POA: Diagnosis not present

## 2021-08-04 DIAGNOSIS — Z170 Estrogen receptor positive status [ER+]: Secondary | ICD-10-CM

## 2021-08-04 LAB — LIPID PANEL
Cholesterol: 157 mg/dL (ref 0–200)
HDL: 50.6 mg/dL (ref >39.00)
LDL Cholesterol: 90 mg/dL (ref 0–99)
NonHDL: 105.96
Total CHOL/HDL Ratio: 3
Triglycerides: 78 mg/dL (ref 0.0–149.0)
VLDL: 15.6 mg/dL (ref 0.0–40.0)

## 2021-08-04 LAB — COMPREHENSIVE METABOLIC PANEL
ALT: 17 U/L (ref 0–35)
AST: 19 U/L (ref 0–37)
Albumin: 4.1 g/dL (ref 3.5–5.2)
Alkaline Phosphatase: 116 U/L (ref 39–117)
BUN: 12 mg/dL (ref 6–23)
CO2: 31 mEq/L (ref 19–32)
Calcium: 9.9 mg/dL (ref 8.4–10.5)
Chloride: 102 mEq/L (ref 96–112)
Creatinine, Ser: 0.9 mg/dL (ref 0.40–1.20)
GFR: 69.03 mL/min (ref >60.00)
Glucose, Bld: 98 mg/dL (ref 70–99)
Potassium: 3.6 mEq/L (ref 3.5–5.1)
Sodium: 140 mEq/L (ref 135–145)
Total Bilirubin: 0.6 mg/dL (ref 0.2–1.2)
Total Protein: 7.5 g/dL (ref 6.0–8.3)

## 2021-08-04 LAB — HEMOGLOBIN A1C: Hgb A1c MFr Bld: 6.4 % (ref 4.6–6.5)

## 2021-08-04 MED ORDER — LETROZOLE 2.5 MG PO TABS: 2.5000 mg | ORAL_TABLET | Freq: Every day | ORAL | 0 refills | Status: DC

## 2021-08-04 NOTE — Patient Instructions (Addendum)
Take 1200 mg of calcium daily and at least 09-4998 units of vitamin D3 daily.   Give [place] 2-3 business days to get the results of your labs back.   Keep the diet clean and stay active.  Please follow up with your oncologist for further refills of your Femara.   Let us know if you need anything.

## 2021-08-04 NOTE — Progress Notes (Signed)
Subjective:   Chief Complaint  Patient presents with   Follow-up    Diabetes     Madeline Chandler is a 60 y.o. female here for follow-up of diabetes.   Madeline Chandler does not check her sugars routinely.  Patient does not require insulin.   Medications include: Ozempic 1 mg/week Diet is fair, could be better.  Exercise: none  Hypertension Patient presents for hypertension follow up. She does not monitor home blood pressures. She is compliant with medications- Hyzaar 100-25 mg/d, Coreg 25 mg bid. Patient has these side effects of medication: none Diet/exercise as above. No Cp or SOB.   GAD Pt is currently being treated with Cymbalta 30 mg/d, trazodone qhs prn.  Reports doing well since treatment. No thoughts of harming self or others. No self-medication with alcohol, prescription drugs or illicit drugs. Pt is not following with a counselor/psychologist.  Past Medical History:  Diagnosis Date   Ankle fracture 03/09/2019   Left   Arthritis    "hips" (10/28/2016)   Avascular necrosis of bones of both hips    Bulging lumbar disc    2   Cervical pain (neck)    De Quervain's tenosynovitis, bilateral    Ductal carcinoma in situ (DCIS) of right breast 1997; 2018   s/p chemotherapy and radiation treatment   Essential hypertension    08/22/11: BUN 15, creatinine 0.95, GFR 79      Generalized anxiety disorder    GERD (gastroesophageal reflux disease)    History of colonic polyps    History of migraine headaches    "none in the last few years; had them mostly in late 20's/mid 30's"   History of therapeutic radiation    Hypokalemia    Insomnia    Major depressive disorder    Mild neurocognitive disorder, unclear etiology 04/24/2020   OSA (obstructive sleep apnea) 02/19/2018   Awaiting CPAP machine as of 04/24/2020   Restless leg syndrome    Type 2 diabetes mellitus    Upper respiratory infection 07/16/2010     Related testing: Retinal exam: Done Pneumovax: done  Objective:   BP 136/84    Pulse 83    Temp 98.3 F (36.8 C) (Oral)    Ht 5' 5.5" (1.664 m)    Wt 205 lb (93 kg)    SpO2 98%    BMI 33.59 kg/m  General:  Well developed, well nourished, in no apparent distress Skin:  Warm, no pallor or diaphoresis Lungs:  CTAB, no access msc use Cardio:  RRR, no bruits, no LE edema Psych: Age appropriate judgment and insight  Assessment:   Diabetes mellitus type 2 in obese ([place]) - Plan: Comprehensive metabolic panel, Lipid panel, Hemoglobin A1c  Essential hypertension  Anxiety state  Malignant neoplasm of upper-inner quadrant of right breast in female, estrogen receptor positive ([place]) - Plan: letrozole ([place]) 2.5 MG tablet   Plan:   Chronic, stable. Cont Ozempic 1 mg/week. Counseled on diet and exercise. Chronic, stable. Cont Hyzaar 100-25 mg/d, Coreg 25 mg bid.  Chronic, stable. Cont Cymbalta 30 mg/d, trazodone prn.  F/u in 6 mo. The patient voiced understanding and agreement to the plan.  [place], DO 08/04/21 9:09 AM

## 2021-08-04 NOTE — Addendum Note (Signed)
Addended by: Sharon Seller B on: 08/04/2021 09:17 AM   Modules accepted: Orders

## 2021-08-06 ENCOUNTER — Telehealth: Payer: Self-pay | Admitting: Family Medicine

## 2021-08-06 NOTE — Telephone Encounter (Signed)
Received by fax form from her work to be completed and faxed to [place] results/PCP signature. Done and faxed today 08/06/21

## 2021-08-31 ENCOUNTER — Other Ambulatory Visit: Payer: Self-pay | Admitting: Family Medicine

## 2021-08-31 DIAGNOSIS — Z170 Estrogen receptor positive status [ER+]: Secondary | ICD-10-CM

## 2021-09-02 ENCOUNTER — Telehealth: Payer: Self-pay | Admitting: Family Medicine

## 2021-09-02 NOTE — Telephone Encounter (Signed)
Medication: Semaglutide, 1 MG/DOSE, (OZEMPIC, 1 MG/DOSE,) 4 MG/3ML SOPN  Has the patient contacted their pharmacy? Yes.   (If no, request that the patient contact the pharmacy for the refill.) (If yes, when and what did the pharmacy advise?)  Preferred Pharmacy (with phone number or street name):  [facility] [id number], [address] [place] [address]  Phone:  [phone]  Fax:  [phone]   Agent: Please be advised that RX refills may take up to 3 business days. We ask that you follow-up with your pharmacy.

## 2021-09-03 MED ORDER — OZEMPIC (1 MG/DOSE) 4 MG/3ML ~~LOC~~ SOPN: PEN_INJECTOR | SUBCUTANEOUS | 3 refills | Status: DC

## 2021-09-03 NOTE — Telephone Encounter (Signed)
Sent in

## 2021-09-10 ENCOUNTER — Other Ambulatory Visit: Payer: Self-pay | Admitting: Family Medicine

## 2021-09-27 ENCOUNTER — Other Ambulatory Visit: Payer: Self-pay | Admitting: Family Medicine

## 2021-09-27 DIAGNOSIS — Z170 Estrogen receptor positive status [ER+]: Secondary | ICD-10-CM

## 2021-09-27 DIAGNOSIS — C50211 Malignant neoplasm of upper-inner quadrant of right female breast: Secondary | ICD-10-CM

## 2021-10-23 ENCOUNTER — Other Ambulatory Visit: Payer: Self-pay | Admitting: Family Medicine

## 2021-10-23 DIAGNOSIS — C50211 Malignant neoplasm of upper-inner quadrant of right female breast: Secondary | ICD-10-CM

## 2021-11-11 ENCOUNTER — Other Ambulatory Visit: Payer: Self-pay | Admitting: Family Medicine

## 2021-11-11 DIAGNOSIS — G4700 Insomnia, unspecified: Secondary | ICD-10-CM

## 2021-11-11 DIAGNOSIS — I10 Essential (primary) hypertension: Secondary | ICD-10-CM

## 2021-11-17 ENCOUNTER — Other Ambulatory Visit: Payer: Self-pay | Admitting: Family Medicine

## 2021-11-17 DIAGNOSIS — I10 Essential (primary) hypertension: Secondary | ICD-10-CM

## 2021-12-01 ENCOUNTER — Other Ambulatory Visit: Payer: Self-pay | Admitting: Family Medicine

## 2021-12-01 DIAGNOSIS — Z170 Estrogen receptor positive status [ER+]: Secondary | ICD-10-CM

## 2021-12-03 ENCOUNTER — Ambulatory Visit (INDEPENDENT_AMBULATORY_CARE_PROVIDER_SITE_OTHER): Payer: 59 | Admitting: Medical

## 2021-12-03 ENCOUNTER — Encounter: Payer: Self-pay | Admitting: Medical

## 2021-12-03 VITALS — BP 148/75 | HR 88 | Resp 18 | Ht 65.5 in | Wt 206.0 lb

## 2021-12-03 DIAGNOSIS — J0100 Acute maxillary sinusitis, unspecified: Secondary | ICD-10-CM

## 2021-12-03 DIAGNOSIS — F988 Other specified behavioral and emotional disorders with onset usually occurring in childhood and adolescence: Secondary | ICD-10-CM

## 2021-12-03 DIAGNOSIS — G4489 Other headache syndrome: Secondary | ICD-10-CM

## 2021-12-03 DIAGNOSIS — I10 Essential (primary) hypertension: Secondary | ICD-10-CM | POA: Diagnosis not present

## 2021-12-03 DIAGNOSIS — J301 Allergic rhinitis due to pollen: Secondary | ICD-10-CM | POA: Diagnosis not present

## 2021-12-03 MED ORDER — AZITHROMYCIN 250 MG PO TABS: ORAL_TABLET | ORAL | 0 refills | Status: DC

## 2021-12-03 MED ORDER — LEVOCETIRIZINE DIHYDROCHLORIDE 5 MG PO TABS: 5.0000 mg | ORAL_TABLET | Freq: Every evening | ORAL | 3 refills | Status: DC

## 2021-12-03 MED ORDER — AMLODIPINE BESYLATE 5 MG PO TABS: 5.0000 mg | ORAL_TABLET | Freq: Every day | ORAL | 0 refills | Status: DC

## 2021-12-03 MED ORDER — KETOROLAC TROMETHAMINE 30 MG/ML IJ SOLN
30.0000 mg | Freq: Once | INTRAMUSCULAR | Status: AC
  Administered 2021-12-03: 30 mg via INTRAMUSCULAR

## 2021-12-03 MED ORDER — AZELASTINE HCL 0.1 % NA SOLN: 2.0000 | Freq: Two times a day (BID) | NASAL | 1 refills | Status: DC

## 2021-12-03 NOTE — Progress Notes (Signed)
? ?Subjective:  ? ? Patient ID: Madeline Chandler, female    DOB: 11/29/1960, 61 y.o.   MRN: [id number] ? ?HPI ?Pt has history of migraine ha in the past.  Pt states in past when gets migraines will have to sleep. She states has ha now but not sure if migraine.no light sensitivity. Maybe sound senstitive. Mild nausea this morning. She did try oxycodone for the headache. Pt never had imitrex. HA got worse last night. ? ?It has been one year since her last migraine ha per pt. ? ?ADD- on adderral but does not take on weekends. ? ?Htn- bp is high today.  No motor or sensory deficits. ? ?Pt states mild nasal congested this morning and late last night eyes were itching and watery. She has sneezing recently as well. ? ?No fever, no chills or diffuse myalgias. But does not hx of hip replacements. Hips little painful recently ? ? ? ? ?Review of Systems  ?Constitutional:  Negative for chills, fatigue and fever.  ?HENT:  Positive for congestion, sinus pressure and sinus pain.   ?Respiratory:  Negative for chest tightness, shortness of breath and wheezing.   ?Cardiovascular:  Negative for chest pain and palpitations.  ?Gastrointestinal:  Negative for abdominal pain.  ?Genitourinary:  Negative for dysuria.  ?Musculoskeletal:  Negative for back pain.  ?Neurological:  Positive for headaches. Negative for dizziness, speech difficulty, weakness and numbness.  ?Hematological:  Negative for adenopathy. Does not bruise/bleed easily.  ?Psychiatric/Behavioral:  Negative for decreased concentration.   ? ? ?Past Medical History:  ?Diagnosis Date  ? Ankle fracture 03/09/2019  ? Left  ? Arthritis   ? "hips" (10/28/2016)  ? Avascular necrosis of bones of both hips   ? Bulging lumbar disc   ? 2  ? Cervical pain (neck)   ? De Quervain's tenosynovitis, bilateral   ? Ductal carcinoma in situ (DCIS) of right breast 1997; 2018  ? s/p chemotherapy and radiation treatment  ? Essential hypertension   ? 08/22/11: BUN 15, creatinine 0.95, GFR 79     ?  Generalized anxiety disorder   ? GERD (gastroesophageal reflux disease)   ? History of colonic polyps   ? History of migraine headaches   ? "none in the last few years; had them mostly in late 20's/mid 30's"  ? History of therapeutic radiation   ? Hypokalemia   ? Insomnia   ? Major depressive disorder   ? Mild neurocognitive disorder, unclear etiology 04/24/2020  ? OSA (obstructive sleep apnea) 02/19/2018  ? Awaiting CPAP machine as of 04/24/2020  ? Restless leg syndrome   ? Type 2 diabetes mellitus   ? Upper respiratory infection 07/16/2010  ? ?  ?Social History  ? ?Socioeconomic History  ? Marital status: Single  ?  Spouse name: Not on file  ? Number of children: Not on file  ? Years of education: 75  ? Highest education level: Some college, no degree  ?Occupational History  ? Not on file  ?Tobacco Use  ? Smoking status: Never  ? Smokeless tobacco: Never  ?Substance and Sexual Activity  ? Alcohol use: No  ? Drug use: No  ? Sexual activity: Not Currently  ?Other Topics Concern  ? Not on file  ?Social History Narrative  ? Not on file  ? ?Social Determinants of Health  ? ?Financial Resource Strain: Not on file  ?Food Insecurity: Not on file  ?Transportation Needs: Not on file  ?Physical Activity: Not on file  ?Stress: Not on  file  ?Social Connections: Not on file  ?Intimate Partner Violence: Not on file  ? ? ?Past Surgical History:  ?Procedure Laterality Date  ? ABDOMINAL HYSTERECTOMY  2000s  ? ANTERIOR CERVICAL DECOMP/DISCECTOMY FUSION N/A 12/02/2015  ? Procedure: ANTERIOR CERVICAL DECOMPRESSION/DISCECTOMY FUSION C4 - C7  3 LEVELS;  Surgeon: Melina Schools, MD;  Location: [facility];  Service: Orthopedics;  Laterality: N/A;  ? BACK SURGERY    ? BREAST BIOPSY Right 1997; 08/2016  ? BREAST LUMPECTOMY Right 1997  ? BREAST RECONSTRUCTION Right 07/07/2017  ? Procedure: RIGHT NIPPLE AEROLA RECONSTRUCTION WITH LOCAL FLAP FROM RIGHT GROIN;  Surgeon: Irene Limbo, MD;  Location: [facility];  Service: Plastics;   Laterality: Right;  ? BREAST REDUCTION SURGERY Left 03/07/2017  ? Procedure: LEFT BREAST REDUCTION FOR SYMMETRY;  Surgeon: Irene Limbo, MD;  Location: [facility];  Service: Plastics;  Laterality: Left;  ? COLONOSCOPY W/ POLYPECTOMY  2010  ? Dr Collene Mares  ? DILATION AND CURETTAGE OF UTERUS  1990s  ? HARDWARE REMOVAL Left 10/31/2019  ? Procedure: LEFT LEG MEDIAL HARDWARE REMOVAL;  Surgeon: Erle Crocker, MD;  Location: [facility];  Service: Orthopedics;  Laterality: Left;  PROCEDURE: LEFT LEG MEDIAL HARDWARE REMOVAL ?LENGTH OF SURGERY: 1 HOUR  ? JOINT REPLACEMENT    ? LATISSIMUS FLAP TO BREAST Right 10/28/2016  ? Procedure: LATISSIMUS FLAP TO BREAST WITH PLACEMENT OF TISSUE EXPANDER ON THE RIGHT;  Surgeon: Irene Limbo, MD;  Location: [facility];  Service: Plastics;  Laterality: Right;  RFNA  ? MASTECTOMY Right 10/28/2016  ? RECONSTRUCTION BREAST W/ LATISSIMUS DORSI FLAP Right 10/28/2016  ?  WITH PLACEMENT OF TISSUE EXPANDER   ? REMOVAL OF BILATERAL TISSUE EXPANDERS WITH PLACEMENT OF BILATERAL BREAST IMPLANTS Right 03/07/2017  ? Procedure: REMOVAL OF RIGHT BREAST TISSUE EXPANDER WITH PLACEMENT OF RIGHT BREAST IMPLANT;  Surgeon: Irene Limbo, MD;  Location: [facility];  Service: Plastics;  Laterality: Right;  ? TIBIA IM NAIL INSERTION Left 03/09/2019  ? Procedure: INTRAMEDULLARY (IM) NAIL TIBIAL;  Surgeon: Erle Crocker, MD;  Location: [facility];  Service: Orthopedics;  Laterality: Left;  ? TISSUE EXPANDER PLACEMENT Right 10/28/2016  ? Procedure: TISSUE EXPANDER;  Surgeon: Irene Limbo, MD;  Location: [facility];  Service: Plastics;  Laterality: Right;  ? TOTAL HIP ARTHROPLASTY Right 05/12/2015  ? Procedure: RIGHT TOTAL HIP ARTHROPLASTY ANTERIOR APPROACH;  Surgeon: Paralee Cancel, MD;  Location: [facility];  Service: Orthopedics;  Laterality: Right;  ? TOTAL HIP ARTHROPLASTY Left 06/23/2015  ? Procedure: LEFT TOTAL HIP ARTHROPLASTY ANTERIOR APPROACH;  Surgeon: Paralee Cancel,  MD;  Location: [facility];  Service: Orthopedics;  Laterality: Left;  ? TOTAL MASTECTOMY Right 10/28/2016  ? Procedure: RIGHT MASTECTOMY;  Surgeon: Alphonsa Overall, MD;  Location: [facility];  Service: General;  Laterality: Right;  ? ? ?Family History  ?Problem Relation Age of Onset  ? Hypertension Mother   ? Diabetes Mother   ? Breast cancer Mother 68  ? Hypertension Sister   ? Hypertension Brother   ? Cancer Maternal Aunt   ?     leukemia  ? Breast cancer Paternal Aunt   ?     diagnosed in 75s or 28s  ? Cancer Paternal Uncle   ?     unknown type  ? Cancer Paternal Aunt   ?     unknown type  ? Breast cancer Cousin 104  ? Dementia Maternal Grandmother   ? Stroke Neg Hx   ? Heart disease  Neg Hx   ? ? ?Allergies  ?Allergen Reactions  ? Prednisone Other (See Comments)  ?  headaches  ? ? ?Current Outpatient Medications on File Prior to Visit  ?Medication Sig Dispense Refill  ? amphetamine-dextroamphetamine (ADDERALL) 20 MG tablet Take 20 mg by mouth daily.  0  ? atorvastatin (LIPITOR) 40 MG tablet Take 1 tablet (40 mg total) by mouth daily. 90 tablet 2  ? carvedilol (COREG) 25 MG tablet TAKE ONE TABLET BY MOUTH TWICE A DAY WITH A MEAL 60 tablet 2  ? DULoxetine (CYMBALTA) 30 MG capsule Take 1 capsule (30 mg total) by mouth daily. 90 capsule 2  ? glucose blood (ONETOUCH VERIO) test strip Use once daily to check blood sugar.  DX E11.9 100 each 1  ? letrozole (FEMARA) 2.5 MG tablet TAKE ONE TABLET BY MOUTH DAILY 30 tablet 0  ? losartan-hydrochlorothiazide (HYZAAR) 100-25 MG tablet TAKE ONE TABLET BY MOUTH DAILY 30 tablet 2  ? omeprazole (PRILOSEC) 40 MG capsule TAKE ONE CAPSULE BY MOUTH DAILY 30 capsule 1  ? ONETOUCH DELICA LANCETS FINE MISC Use as directed once daily to check blood sugar.  DXE11.9 100 each 1  ? Semaglutide, 1 MG/DOSE, (OZEMPIC, 1 MG/DOSE,) 4 MG/3ML SOPN DIAL AND INJECT UNDER THE SKIN ONCE WEEKLY 3 mL 3  ? traZODone (DESYREL) 50 MG tablet TAKE 1/2 TO 1 TABLET BY MOUTH EVERY NIGHT AT BEDTIME AS NEEDED FOR SLEEP 30 tablet  0  ? ?No current facility-administered medications on file prior to visit.  ? ? ?BP (!) 160/85   Pulse 88   Resp 18   Ht 5' 5.5" (1.664 m)   Wt 206 lb (93.4 kg)   SpO2 98%   BMI 33.76 kg/m?  ?  ?   ?Objective:

## 2021-12-03 NOTE — Patient Instructions (Addendum)
Recent described frontal region headache with some maxillary sinus region pain and allergy signs/symptoms. ? ? ?Normal neurologic exam presently and history of migraine.  Not fully convinced headache migraine presently.  Decided to give you Toradol 30 mg IM injection today and will see if this helps.  If you get worse headache over the weekend or any motor/sensory deficits then be seen in the emergency department. ? ?Presently do think you have allergic rhinitis symptoms and sinus infection.  Prescribing Astelin nasal spray, Xyzal antihistamine and azithromycin antibiotic. ? ?Your blood pressure was initially significantly elevated and did come down after repeat checks.  I want you to continue your blood pressure your current BP medications and adding on amlodipine 5 mg daily.  Initial blood pressure was high enough that I think this could be a contributor to headache. ? ?ADD-on stimulant medication.  Want you to stop ADD medication as this could increase blood pressure and be a factor in headache as well. ? ?Follow-up next Tuesday or Wednesday with PCP or sooner if needed. ?

## 2021-12-07 ENCOUNTER — Ambulatory Visit (INDEPENDENT_AMBULATORY_CARE_PROVIDER_SITE_OTHER): Payer: 59 | Admitting: Family Medicine

## 2021-12-07 ENCOUNTER — Encounter: Payer: Self-pay | Admitting: Family Medicine

## 2021-12-07 VITALS — BP 122/76 | HR 75 | Temp 98.1°F | Ht 65.5 in | Wt 208.0 lb

## 2021-12-07 DIAGNOSIS — F331 Major depressive disorder, recurrent, moderate: Secondary | ICD-10-CM

## 2021-12-07 DIAGNOSIS — E669 Obesity, unspecified: Secondary | ICD-10-CM | POA: Diagnosis not present

## 2021-12-07 DIAGNOSIS — G4733 Obstructive sleep apnea (adult) (pediatric): Secondary | ICD-10-CM | POA: Diagnosis not present

## 2021-12-07 DIAGNOSIS — F411 Generalized anxiety disorder: Secondary | ICD-10-CM

## 2021-12-07 DIAGNOSIS — I10 Essential (primary) hypertension: Secondary | ICD-10-CM

## 2021-12-07 DIAGNOSIS — E1169 Type 2 diabetes mellitus with other specified complication: Secondary | ICD-10-CM

## 2021-12-07 MED ORDER — SEMAGLUTIDE (2 MG/DOSE) 8 MG/3ML ~~LOC~~ SOPN: 2.0000 mg | PEN_INJECTOR | SUBCUTANEOUS | 3 refills | Status: DC

## 2021-12-07 NOTE — Patient Instructions (Addendum)
You may want to move one blood pressure medication at a time. ? ?Keep the diet clean and stay active. ? ?Stay on the amlodipine.  ? ?If you are interested in having a physician evaluate you to see if you qualify for a support animal, please call the psychiatry team. Some options are listed below. ? ?Please set up a follow up appointment w your oncologist.  ? ?Let us know if you need anything. ? ?[facility] ?[address] ?[place], [place] [address] ?[phone] ? ?[facility] ?[address] ?[place], [place] [address] ?[phone] ? ?[facility] ?[place], [place] [address] ?[phone] ? ?[facility] ?[place], [address], [place], [place], [address][phone] ?[address], [place], [place], #[phone] ? ?[facility] ?[address] ?[phone] ? ?[facility] ?[place] ?[place], [place] ?[phone] ? ?[facility] ?[address] ?[place], [place] ?[phone] ? ?[facility] ?[address] ?[place], [place]   ?[phone].   ?Please go online to complete a form to submit first. ? ?[facility]  ?[address], [place], [place] ?[phone].   ?  ?[facility] -  local practices located at: ?[address] [place], [place]. [phone]. ?[place], [place], [place].  [phone]. ?[address], [place], [place], [place].  [phone]. ? ?Contact one of these offices sooner than later as it can take 2-3 months to get a new patient appointment.  ?

## 2021-12-07 NOTE — Progress Notes (Signed)
Chief Complaint  ?Patient presents with  ? Follow-up  ? ? ?Subjective ?Madeline Chandler is a 61 y.o. female who presents for hypertension follow up. ?She does not routinely monitor home blood pressures. ?She is compliant with medications- Norvasc 5 mg/d added back, Hyzaar 100-25 mg/d, Coreg 25 mg bid. ?Patient has these side effects of medication: none ?She is adhering to a healthy diet overall. ?Current exercise: none ?No CP or SOB. ? ?Fatigue  ?Hx of OSA, most recent sleep study was 08/2020. Mild OSA at that time, improved from 2019 as she has lost quite a bit of weight.  Diet/exercise as above.  She has lots of stress and anxiety in her life.  She takes Cymbalta 30 mg daily.  She does not follow with a psychiatrist but is interested in seeing someone who can get her an emotional support animal. ?  ?Past Medical History:  ?Diagnosis Date  ? Ankle fracture 03/09/2019  ? Left  ? Arthritis   ? "hips" (10/28/2016)  ? Avascular necrosis of bones of both hips   ? Bulging lumbar disc   ? 2  ? Cervical pain (neck)   ? De Quervain's tenosynovitis, bilateral   ? Ductal carcinoma in situ (DCIS) of right breast 1997; 2018  ? s/p chemotherapy and radiation treatment  ? Essential hypertension   ? 08/22/11: BUN 15, creatinine 0.95, GFR 79     ? Generalized anxiety disorder   ? GERD (gastroesophageal reflux disease)   ? History of colonic polyps   ? History of migraine headaches   ? "none in the last few years; had them mostly in late 20's/mid 30's"  ? History of therapeutic radiation   ? Hypokalemia   ? Insomnia   ? Major depressive disorder   ? Mild neurocognitive disorder, unclear etiology 04/24/2020  ? OSA (obstructive sleep apnea) 02/19/2018  ? Awaiting CPAP machine as of 04/24/2020  ? Restless leg syndrome   ? Type 2 diabetes mellitus   ? Upper respiratory infection 07/16/2010  ? ? ?Exam ?BP 122/76   Pulse 75   Temp 98.1 ?F (36.7 ?C) (Oral)   Ht 5' 5.5" (1.664 m)   Wt 208 lb (94.3 kg)   SpO2 95%   BMI 34.09 kg/m?  ?General:   well developed, well nourished, in no apparent distress ?Heart: RRR, no bruits, no LE edema ?Lungs: clear to auscultation, no accessory muscle use ?Psych: well oriented with normal range of affect and appropriate judgment/insight ? ?Essential hypertension ? ?Obesity (BMI 30-39.9) - Plan: Semaglutide, 2 MG/DOSE, 8 MG/3ML SOPN ? ?OSA (obstructive sleep apnea) ? ?Moderate episode of recurrent major depressive disorder ([place]) ? ?Anxiety state ? ?Diabetes mellitus type 2 in obese ([place]) - Plan: Semaglutide, 2 MG/DOSE, 8 MG/3ML SOPN ? ?Chronic, now stable.  Continue Norvasc 5 mg daily, Coreg 25 mg twice daily, Hyzaar 100-25 mg daily.  Counseled on diet and exercise. ?Counseled on diet and exercise.  Will increase Ozempic from 1 mg weekly to 2 mg weekly. ?Recommended she reach out to her sleep specialist again. ?Continue Cymbalta for now.  I did provide her with some psychiatric outpatient resources.  I informed her I do not fill out forms or write letters on behalf of patient's for emotional support animals. ?Increasing Ozempic. ?F/u as originally scheduled. ?The patient voiced understanding and agreement to the plan. ? ?I spent 40 minutes with the patient discussing the above plans in addition to reviewing her chart on the same day of the visit. ? ?Hart Carwin  Nolene Ebbs, DO ?12/07/21  ?4:51 PM ? ?

## 2021-12-14 ENCOUNTER — Other Ambulatory Visit: Payer: Self-pay | Admitting: Family Medicine

## 2021-12-14 DIAGNOSIS — F411 Generalized anxiety disorder: Secondary | ICD-10-CM

## 2021-12-14 DIAGNOSIS — G4700 Insomnia, unspecified: Secondary | ICD-10-CM

## 2021-12-27 ENCOUNTER — Other Ambulatory Visit: Payer: Self-pay | Admitting: Family Medicine

## 2021-12-27 ENCOUNTER — Other Ambulatory Visit: Payer: Self-pay | Admitting: Obstetrics and Gynecology

## 2021-12-27 DIAGNOSIS — C50211 Malignant neoplasm of upper-inner quadrant of right female breast: Secondary | ICD-10-CM

## 2021-12-27 DIAGNOSIS — R928 Other abnormal and inconclusive findings on diagnostic imaging of breast: Secondary | ICD-10-CM

## 2021-12-30 ENCOUNTER — Other Ambulatory Visit: Payer: Self-pay | Admitting: Medical

## 2022-01-07 ENCOUNTER — Ambulatory Visit
Admission: RE | Admit: 2022-01-07 | Discharge: 2022-01-07 | Disposition: A | Discharge status: Home / Self Care | Payer: 59 | Source: Ambulatory Visit | Attending: Obstetrics and Gynecology | Admitting: Obstetrics and Gynecology

## 2022-01-07 ENCOUNTER — Other Ambulatory Visit: Payer: Self-pay | Admitting: Obstetrics and Gynecology

## 2022-01-07 DIAGNOSIS — R928 Other abnormal and inconclusive findings on diagnostic imaging of breast: Secondary | ICD-10-CM

## 2022-01-07 DIAGNOSIS — R921 Mammographic calcification found on diagnostic imaging of breast: Secondary | ICD-10-CM

## 2022-01-14 ENCOUNTER — Ambulatory Visit
Admission: RE | Admit: 2022-01-14 | Discharge: 2022-01-14 | Disposition: A | Discharge status: Home / Self Care | Payer: 59 | Source: Ambulatory Visit | Attending: Obstetrics and Gynecology | Admitting: Obstetrics and Gynecology

## 2022-01-14 DIAGNOSIS — R921 Mammographic calcification found on diagnostic imaging of breast: Secondary | ICD-10-CM

## 2022-01-17 ENCOUNTER — Other Ambulatory Visit: Payer: Self-pay | Admitting: Family Medicine

## 2022-01-17 ENCOUNTER — Other Ambulatory Visit: Payer: Self-pay | Admitting: Obstetrics and Gynecology

## 2022-01-17 DIAGNOSIS — C50912 Malignant neoplasm of unspecified site of left female breast: Secondary | ICD-10-CM

## 2022-01-17 DIAGNOSIS — G4700 Insomnia, unspecified: Secondary | ICD-10-CM

## 2022-01-21 ENCOUNTER — Ambulatory Visit
Admission: RE | Admit: 2022-01-21 | Discharge: 2022-01-21 | Disposition: A | Discharge status: Home / Self Care | Payer: 59 | Source: Ambulatory Visit | Attending: Obstetrics and Gynecology | Admitting: Obstetrics and Gynecology

## 2022-01-21 DIAGNOSIS — C50912 Malignant neoplasm of unspecified site of left female breast: Secondary | ICD-10-CM

## 2022-01-25 ENCOUNTER — Encounter: Payer: Self-pay | Admitting: Family Medicine

## 2022-01-25 ENCOUNTER — Ambulatory Visit (INDEPENDENT_AMBULATORY_CARE_PROVIDER_SITE_OTHER): Payer: 59 | Admitting: Family Medicine

## 2022-01-25 ENCOUNTER — Encounter: Payer: 59 | Admitting: Family Medicine

## 2022-01-25 VITALS — BP 150/81 | HR 70 | Temp 97.7°F | Resp 16 | Ht 65.5 in | Wt 201.8 lb

## 2022-01-25 DIAGNOSIS — M5441 Lumbago with sciatica, right side: Secondary | ICD-10-CM | POA: Diagnosis not present

## 2022-01-25 MED ORDER — METHYLPREDNISOLONE ACETATE 40 MG/ML IJ SUSP
40.0000 mg | Freq: Once | INTRAMUSCULAR | Status: AC
  Administered 2022-01-25: 40 mg via INTRAMUSCULAR

## 2022-01-25 MED ORDER — CYCLOBENZAPRINE HCL 10 MG PO TABS: 10.0000 mg | ORAL_TABLET | Freq: Three times a day (TID) | ORAL | 0 refills | Status: DC | PRN

## 2022-01-25 MED ORDER — MELOXICAM 7.5 MG PO TABS: 7.5000 mg | ORAL_TABLET | Freq: Every day | ORAL | 0 refills | Status: DC

## 2022-01-25 NOTE — Progress Notes (Signed)
Established Patient Office Visit  Subjective   Patient ID: Madeline Chandler, female    DOB: 1961/03/25  Age: 61 y.o. MRN: [id number]  Chief Complaint  Patient presents with   right hip pain    HPI Pt is here alone c/o R hip pain radiates to knee since Saturday.   She remembers bending and feeling a pull in R hip last week ---  it progressively got worse from there. Patient Active Problem List   Diagnosis Date Noted   Moderate episode of recurrent major depressive disorder ([place]) 01/18/2021   History of therapeutic radiation 04/24/2020   Mild neurocognitive disorder, unclear etiology 04/24/2020   Insomnia 02/04/2020   Soft tissue mass 08/06/2019   Syncope 03/09/2019   Hypokalemia 03/09/2019   Greater trochanteric bursitis of left hip 08/09/2018   Left wrist pain 06/18/2018   OSA (obstructive sleep apnea) 02/19/2018   Chronic cough 02/19/2018   Diabetes mellitus type 2 in obese ([place]) 01/26/2018   De Quervain's tenosynovitis, bilateral 11/16/2017   Ductal carcinoma in situ (DCIS) of right breast 09/16/2016   Neck pain 12/02/2015   Preoperative clearance 11/10/2015   Peripheral edema 10/05/2015   Cervical pain (neck) 10/05/2015   S/P left THA, AA 06/23/2015   Obesity (BMI 30-39.9) 05/13/2015   Muscle spasm 02/27/2015   Pain in joint, shoulder region 09/05/2014   Leg swelling 12/08/2013   Other abnormal glucose 02/23/2012   Nonspecific abnormal electrocardiogram (ECG) (EKG) 02/23/2012   GERD (gastroesophageal reflux disease) 10/23/2009   Anxiety state 09/02/2009   Major depressive disorder 07/10/2008   History of breast cancer 07/10/2008   History of colonic polyps 07/10/2008   Fibroids, uterus 02/15/2008   History of dilation and curettage 02/15/2008   Essential hypertension 09/25/2007   Past Medical History:  Diagnosis Date   Ankle fracture 03/09/2019   Left   Arthritis    "hips" (10/28/2016)   Avascular necrosis of bones of both hips     Bulging lumbar disc    2   Cervical pain (neck)    De Quervain's tenosynovitis, bilateral    Ductal carcinoma in situ (DCIS) of right breast 1997; 2018   s/p chemotherapy and radiation treatment   Essential hypertension    08/22/11: BUN 15, creatinine 0.95, GFR 79      Generalized anxiety disorder    GERD (gastroesophageal reflux disease)    History of colonic polyps    History of migraine headaches    "none in the last few years; had them mostly in late 20's/mid 30's"   History of therapeutic radiation    Hypokalemia    Insomnia    Major depressive disorder    Mild neurocognitive disorder, unclear etiology 04/24/2020   OSA (obstructive sleep apnea) 02/19/2018   Awaiting CPAP machine as of 04/24/2020   Restless leg syndrome    Type 2 diabetes mellitus    Upper respiratory infection 07/16/2010   Past Surgical History:  Procedure Laterality Date   ABDOMINAL HYSTERECTOMY  2000s   ANTERIOR CERVICAL DECOMP/DISCECTOMY FUSION N/A 12/02/2015   Procedure: ANTERIOR CERVICAL DECOMPRESSION/DISCECTOMY FUSION C4 - C7  3 LEVELS;  Surgeon: Melina Schools, MD;  Location: [place];  Service: Orthopedics;  Laterality: N/A;   BACK SURGERY     BREAST BIOPSY Right 1997; 08/2016   BREAST LUMPECTOMY Right 1997   BREAST RECONSTRUCTION Right 07/07/2017   Procedure: RIGHT NIPPLE AEROLA RECONSTRUCTION WITH LOCAL FLAP FROM  RIGHT GROIN;  Surgeon: Irene Limbo, MD;  Location: [place];  Service: Plastics;  Laterality: Right;   BREAST REDUCTION SURGERY Left 03/07/2017   Procedure: LEFT BREAST REDUCTION FOR SYMMETRY;  Surgeon: Irene Limbo, MD;  Location: [place];  Service: Plastics;  Laterality: Left;   COLONOSCOPY W/ POLYPECTOMY  2010   Dr Collene Mares   DILATION AND CURETTAGE OF UTERUS  1990s   HARDWARE REMOVAL Left 10/31/2019   Procedure: LEFT LEG MEDIAL HARDWARE REMOVAL;  Surgeon: Erle Crocker, MD;  Location: [place];  Service: Orthopedics;  Laterality:  Left;  PROCEDURE: LEFT LEG MEDIAL HARDWARE REMOVAL LENGTH OF SURGERY: 1 HOUR   JOINT REPLACEMENT     LATISSIMUS FLAP TO BREAST Right 10/28/2016   Procedure: LATISSIMUS FLAP TO BREAST WITH PLACEMENT OF TISSUE EXPANDER ON THE RIGHT;  Surgeon: Irene Limbo, MD;  Location: [place];  Service: Plastics;  Laterality: Right;  RFNA   MASTECTOMY Right 10/28/2016   RECONSTRUCTION BREAST W/ LATISSIMUS DORSI FLAP Right 10/28/2016    WITH PLACEMENT OF TISSUE EXPANDER    REMOVAL OF BILATERAL TISSUE EXPANDERS WITH PLACEMENT OF BILATERAL BREAST IMPLANTS Right 03/07/2017   Procedure: REMOVAL OF RIGHT BREAST TISSUE EXPANDER WITH PLACEMENT OF RIGHT BREAST IMPLANT;  Surgeon: Irene Limbo, MD;  Location: [place];  Service: Plastics;  Laterality: Right;   TIBIA IM NAIL INSERTION Left 03/09/2019   Procedure: INTRAMEDULLARY (IM) NAIL TIBIAL;  Surgeon: Erle Crocker, MD;  Location: [place];  Service: Orthopedics;  Laterality: Left;   TISSUE EXPANDER PLACEMENT Right 10/28/2016   Procedure: TISSUE EXPANDER;  Surgeon: Irene Limbo, MD;  Location: [place];  Service: Plastics;  Laterality: Right;   TOTAL HIP ARTHROPLASTY Right 05/12/2015   Procedure: RIGHT TOTAL HIP ARTHROPLASTY ANTERIOR APPROACH;  Surgeon: Paralee Cancel, MD;  Location: [facility];  Service: Orthopedics;  Laterality: Right;   TOTAL HIP ARTHROPLASTY Left 06/23/2015   Procedure: LEFT TOTAL HIP ARTHROPLASTY ANTERIOR APPROACH;  Surgeon: Paralee Cancel, MD;  Location: [facility];  Service: Orthopedics;  Laterality: Left;   TOTAL MASTECTOMY Right 10/28/2016   Procedure: RIGHT MASTECTOMY;  Surgeon: Alphonsa Overall, MD;  Location: [place];  Service: General;  Laterality: Right;   Social History   Tobacco Use   Smoking status: Never   Smokeless tobacco: Never  Substance Use Topics   Alcohol use: No   Drug use: No   Social History   Socioeconomic History   Marital status: Single    Spouse name: Not on file   Number of children: Not on file    Years of education: 13   Highest education level: Some college, no degree  Occupational History   Not on file  Tobacco Use   Smoking status: Never   Smokeless tobacco: Never  Substance and Sexual Activity   Alcohol use: No   Drug use: No   Sexual activity: Not Currently  Other Topics Concern   Not on file  Social History Narrative   Not on file   Social Determinants of Health   Financial Resource Strain: Not on file  Food Insecurity: Not on file  Transportation Needs: Not on file  Physical Activity: Not on file  Stress: Not on file  Social Connections: Not on file  Intimate Partner Violence: Not on file   Family Status  Relation Name Status   Mother  (Not Specified)   Sister  (Not Specified)   Brother  (Not Specified)   Mat Aunt  (Not Specified)  Ethlyn Daniels  (Not Specified)   Annamarie Major  (Not Specified)   Ethlyn Daniels  (Not Specified)   Cousin maternal Alive   MGM  (Not Specified)   Neg Hx  (Not Specified)   Family History  Problem Relation Age of Onset   Hypertension Mother    Diabetes Mother    Breast cancer Mother 44   Hypertension Sister    Hypertension Brother    Cancer Maternal Aunt        leukemia   Breast cancer Paternal Aunt        diagnosed in 2s or 93s   Cancer Paternal Uncle        unknown type   Cancer Paternal Aunt        unknown type   Breast cancer Cousin 71   Dementia Maternal Grandmother    Stroke Neg Hx    Heart disease Neg Hx    Allergies  Allergen Reactions   Prednisone Other (See Comments)    headaches      Review of Systems  Constitutional:  Negative for fever and malaise/fatigue.  HENT:  Negative for congestion.   Eyes:  Negative for blurred vision.  Respiratory:  Negative for shortness of breath.   Cardiovascular:  Negative for chest pain, palpitations and leg swelling.  Gastrointestinal:  Negative for abdominal pain, blood in stool and nausea.  Genitourinary:  Negative for dysuria and frequency.  Musculoskeletal:  Positive  for back pain and joint pain. Negative for falls.  Skin:  Negative for rash.  Neurological:  Negative for dizziness, loss of consciousness and headaches.  Endo/Heme/Allergies:  Negative for environmental allergies.  Psychiatric/Behavioral:  Negative for depression. The patient is not nervous/anxious.       Objective:     BP (!) 150/81 (BP Location: Left Arm, Cuff Size: Large)   Pulse 70   Temp 97.7 F (36.5 C) (Oral)   Resp 16   Ht 5' 5.5" (1.664 m)   Wt 201 lb 12.8 oz (91.5 kg)   SpO2 95%   BMI 33.07 kg/m  BP Readings from Last 3 Encounters:  01/25/22 (!) 150/81  12/07/21 122/76  12/03/21 (!) 148/75   Wt Readings from Last 3 Encounters:  01/25/22 201 lb 12.8 oz (91.5 kg)  12/07/21 208 lb (94.3 kg)  12/03/21 206 lb (93.4 kg)   SpO2 Readings from Last 3 Encounters:  01/25/22 95%  12/07/21 95%  12/03/21 98%      Physical Exam Vitals and nursing note reviewed.  Constitutional:      Appearance: She is well-developed.  HENT:     Head: Normocephalic and atraumatic.  Eyes:     Conjunctiva/sclera: Conjunctivae normal.  Neck:     Thyroid: No thyromegaly.     Vascular: No carotid bruit or JVD.  Cardiovascular:     Rate and Rhythm: Normal rate and regular rhythm.     Heart sounds: Normal heart sounds. No murmur heard. Pulmonary:     Effort: Pulmonary effort is normal. No respiratory distress.     Breath sounds: Normal breath sounds. No wheezing or rales.  Chest:     Chest wall: No tenderness.  Musculoskeletal:     Cervical back: Normal range of motion and neck supple.     Lumbar back: Spasms and tenderness present. Normal range of motion. Negative right straight leg raise test and negative left straight leg raise test.  Neurological:     Mental Status: She is alert and oriented to person, place, and time.  No results found for any visits on 01/25/22.  Last CBC Lab Results  Component Value Date   WBC 5.5 01/18/2021   HGB 13.1 01/18/2021   HCT 39.9  01/18/2021   MCV 88.3 01/18/2021   MCH 28.5 03/28/2019   RDW 14.7 01/18/2021   PLT 217.0 19/75/8832   Last metabolic panel Lab Results  Component Value Date   GLUCOSE 98 08/04/2021   NA 140 08/04/2021   K 3.6 08/04/2021   CL 102 08/04/2021   CO2 31 08/04/2021   BUN 12 08/04/2021   CREATININE 0.90 08/04/2021   GFRNONAA >60 10/28/2019   CALCIUM 9.9 08/04/2021   PROT 7.5 08/04/2021   ALBUMIN 4.1 08/04/2021   BILITOT 0.6 08/04/2021   ALKPHOS 116 08/04/2021   AST 19 08/04/2021   ALT 17 08/04/2021   ANIONGAP 7 10/28/2019   Last lipids Lab Results  Component Value Date   CHOL 157 08/04/2021   HDL 50.60 08/04/2021   LDLCALC 90 08/04/2021   TRIG 78.0 08/04/2021   CHOLHDL 3 08/04/2021   Last hemoglobin A1c Lab Results  Component Value Date   HGBA1C 6.4 08/04/2021   Last thyroid functions Lab Results  Component Value Date   TSH 1.16 04/03/2015   Last vitamin D No results found for: "25OHVITD2", "25OHVITD3", "VD25OH" Last vitamin B12 and Folate No results found for: "VITAMINB12", "FOLATE"    The 10-year ASCVD risk score (Arnett DK, et al., 2019) is: 21%    Assessment & Plan:   Problem List Items Addressed This Visit   None Visit Diagnoses     Acute right-sided low back pain with right-sided sciatica    -  Primary   Relevant Medications   cyclobenzaprine (FLEXERIL) 10 MG tablet   meloxicam (MOBIC) 7.5 MG tablet   methylPREDNISolone acetate (DEPO-MEDROL) injection 40 mg (Completed)     Alt between ice and heat  F/u pcp 2 weeks or sooner prn   Return in about 2 weeks (around 02/08/2022), or if symptoms worsen or fail to improve, for back pain with pcp.    Ann Held, DO

## 2022-01-25 NOTE — Patient Instructions (Signed)
Acute Back Pain, Adult Acute back pain is sudden and usually short-lived. It is often caused by an injury to the muscles and tissues in the back. The injury may result from: A muscle, tendon, or ligament getting overstretched or torn. Ligaments are tissues that connect bones to each other. Lifting something improperly can cause a back strain. Wear and tear (degeneration) of the spinal disks. Spinal disks are circular tissue that provide cushioning between the bones of the spine (vertebrae). Twisting motions, such as while playing sports or doing yard work. A hit to the back. Arthritis. You may have a physical exam, lab tests, and imaging tests to find the cause of your pain. Acute back pain usually goes away with rest and home care. Follow these instructions at home: Managing pain, stiffness, and swelling Take over-the-counter and prescription medicines only as told by your health care provider. Treatment may include medicines for pain and inflammation that are taken by mouth or applied to the skin, or muscle relaxants. Your health care provider may recommend applying ice during the first 24-48 hours after your pain starts. To do this: Put ice in a plastic bag. Place a towel between your skin and the bag. Leave the ice on for 20 minutes, 2-3 times a day. Remove the ice if your skin turns bright red. This is very important. If you cannot feel pain, heat, or cold, you have a greater risk of damage to the area. If directed, apply heat to the affected area as often as told by your health care provider. Use the heat source that your health care provider recommends, such as a moist heat pack or a heating pad. Place a towel between your skin and the heat source. Leave the heat on for 20-30 minutes. Remove the heat if your skin turns bright red. This is especially important if you are unable to feel pain, heat, or cold. You have a greater risk of getting burned. Activity  Do not stay in bed. Staying in  bed for more than 1-2 days can delay your recovery. Sit up and stand up straight. Avoid leaning forward when you sit or hunching over when you stand. If you work at a desk, sit close to it so you do not need to lean over. Keep your chin tucked in. Keep your neck drawn back, and keep your elbows bent at a 90-degree angle (right angle). Sit high and close to the steering wheel when you drive. Add lower back (lumbar) support to your car seat, if needed. Take short walks on even surfaces as soon as you are able. Try to increase the length of time you walk each day. Do not sit, drive, or stand in one place for more than 30 minutes at a time. Sitting or standing for long periods of time can put stress on your back. Do not drive or use heavy machinery while taking prescription pain medicine. Use proper lifting techniques. When you bend and lift, use positions that put less stress on your back: Bend your knees. Keep the load close to your body. Avoid twisting. Exercise regularly as told by your health care provider. Exercising helps your back heal faster and helps prevent back injuries by keeping muscles strong and flexible. Work with a physical therapist to make a safe exercise program, as recommended by your health care provider. Do any exercises as told by your physical therapist. Lifestyle Maintain a healthy weight. Extra weight puts stress on your back and makes it difficult to have good   posture. Avoid activities or situations that make you feel anxious or stressed. Stress and anxiety increase muscle tension and can make back pain worse. Learn ways to manage anxiety and stress, such as through exercise. General instructions Sleep on a firm mattress in a comfortable position. Try lying on your side with your knees slightly bent. If you lie on your back, put a pillow under your knees. Keep your head and neck in a straight line with your spine (neutral position) when using electronic equipment like  smartphones or pads. To do this: Raise your smartphone or pad to look at it instead of bending your head or neck to look down. Put the smartphone or pad at the level of your face while looking at the screen. Follow your treatment plan as told by your health care provider. This may include: Cognitive or behavioral therapy. Acupuncture or massage therapy. Meditation or yoga. Contact a health care provider if: You have pain that is not relieved with rest or medicine. You have increasing pain going down into your legs or buttocks. Your pain does not improve after 2 weeks. You have pain at night. You lose weight without trying. You have a fever or chills. You develop nausea or vomiting. You develop abdominal pain. Get help right away if: You develop new bowel or bladder control problems. You have unusual weakness or numbness in your arms or legs. You feel faint. These symptoms may represent a serious problem that is an emergency. Do not wait to see if the symptoms will go away. Get medical help right away. Call your local emergency services (911 in the [place] Do not drive yourself to the hospital. Summary Acute back pain is sudden and usually short-lived. Use proper lifting techniques. When you bend and lift, use positions that put less stress on your back. Take over-the-counter and prescription medicines only as told by your health care provider, and apply heat or ice as told. This information is not intended to replace advice given to you by your health care provider. Make sure you discuss any questions you have with your health care provider. Document Revised: 10/16/2020 Document Reviewed: 10/16/2020 Elsevier Patient Education  2023 [employer]  

## 2022-01-27 ENCOUNTER — Encounter: Payer: Self-pay | Admitting: *Deleted

## 2022-01-27 NOTE — Progress Notes (Unsigned)
PATIENT NAVIGATOR PROGRESS NOTE  Name: Madeline Chandler Date: 01/27/2022 MRN: [id number]  DOB: [birth year]   Reason for visit:  Introductory phone call  Comments:  Left message for patient to call back regarding coordination of care for new left breast cancer diagnosis    Time spent counseling/coordinating care: 15-30 minutes

## 2022-01-28 ENCOUNTER — Other Ambulatory Visit: Payer: Self-pay | Admitting: Surgery

## 2022-01-28 ENCOUNTER — Telehealth: Payer: Self-pay

## 2022-01-31 ENCOUNTER — Other Ambulatory Visit: Payer: Self-pay | Admitting: Medical

## 2022-01-31 ENCOUNTER — Encounter: Payer: Self-pay | Admitting: *Deleted

## 2022-01-31 ENCOUNTER — Other Ambulatory Visit: Payer: Self-pay | Admitting: Family Medicine

## 2022-01-31 DIAGNOSIS — Z170 Estrogen receptor positive status [ER+]: Secondary | ICD-10-CM

## 2022-02-05 DIAGNOSIS — C50919 Malignant neoplasm of unspecified site of unspecified female breast: Secondary | ICD-10-CM

## 2022-02-05 HISTORY — DX: Malignant neoplasm of unspecified site of unspecified female breast: C50.919

## 2022-02-09 ENCOUNTER — Encounter: Payer: Self-pay | Admitting: *Deleted

## 2022-02-09 ENCOUNTER — Other Ambulatory Visit: Payer: Self-pay | Admitting: Surgery

## 2022-02-09 NOTE — Progress Notes (Signed)
PATIENT NAVIGATOR PROGRESS NOTE  Name: Madeline Chandler Date: 02/09/2022 MRN: [id number]  DOB: 1960/09/16   Reason for visit:  Telephone call  Comments:  Spoke with [name] and she has surgery date of 03/15/2022   She is scheduled to see Dr Benay Spice on 03/29/22 at 1:40 which will be two weeks after surgery      Time spent counseling/coordinating care: 30-45 minutes

## 2022-02-11 ENCOUNTER — Encounter: Payer: Self-pay | Admitting: Family Medicine

## 2022-02-11 ENCOUNTER — Ambulatory Visit (INDEPENDENT_AMBULATORY_CARE_PROVIDER_SITE_OTHER): Payer: 59 | Admitting: Family Medicine

## 2022-02-11 VITALS — BP 112/80 | HR 75 | Temp 98.2°F | Ht 65.5 in | Wt 199.4 lb

## 2022-02-11 DIAGNOSIS — E1169 Type 2 diabetes mellitus with other specified complication: Secondary | ICD-10-CM | POA: Diagnosis not present

## 2022-02-11 DIAGNOSIS — G5701 Lesion of sciatic nerve, right lower limb: Secondary | ICD-10-CM

## 2022-02-11 DIAGNOSIS — Z0000 Encounter for general adult medical examination without abnormal findings: Secondary | ICD-10-CM | POA: Diagnosis not present

## 2022-02-11 DIAGNOSIS — E669 Obesity, unspecified: Secondary | ICD-10-CM | POA: Diagnosis not present

## 2022-02-11 LAB — CBC
HCT: 39.7 % (ref 36.0–46.0)
Hemoglobin: 13.1 g/dL (ref 12.0–15.0)
MCHC: 32.9 g/dL (ref 30.0–36.0)
MCV: 87.9 fl (ref 78.0–100.0)
Platelets: 226 10*3/uL (ref 150.0–400.0)
RBC: 4.51 Mil/uL (ref 3.87–5.11)
RDW: 14.6 % (ref 11.5–15.5)
WBC: 4.6 10*3/uL (ref 4.0–10.5)

## 2022-02-11 LAB — COMPREHENSIVE METABOLIC PANEL
ALT: 19 U/L (ref 0–35)
AST: 19 U/L (ref 0–37)
Albumin: 4.3 g/dL (ref 3.5–5.2)
Alkaline Phosphatase: 89 U/L (ref 39–117)
BUN: 20 mg/dL (ref 6–23)
CO2: 33 mEq/L — ABNORMAL HIGH (ref 19–32)
Calcium: 10.4 mg/dL (ref 8.4–10.5)
Chloride: 98 mEq/L (ref 96–112)
Creatinine, Ser: 0.93 mg/dL (ref 0.40–1.20)
GFR: 66.12 mL/min (ref >60.00)
Glucose, Bld: 89 mg/dL (ref 70–99)
Potassium: 3.6 mEq/L (ref 3.5–5.1)
Sodium: 138 mEq/L (ref 135–145)
Total Bilirubin: 0.8 mg/dL (ref 0.2–1.2)
Total Protein: 7.6 g/dL (ref 6.0–8.3)

## 2022-02-11 LAB — MICROALBUMIN / CREATININE URINE RATIO
Creatinine,U: 106.9 mg/dL
Microalb Creat Ratio: 0.7 mg/g (ref 0.0–30.0)
Microalb, Ur: <0.7 mg/dL (ref 0.0–1.9)

## 2022-02-11 LAB — LIPID PANEL
Cholesterol: 214 mg/dL — ABNORMAL HIGH (ref 0–200)
HDL: 49.7 mg/dL (ref >39.00)
LDL Cholesterol: 145 mg/dL — ABNORMAL HIGH (ref 0–99)
NonHDL: 164.52
Total CHOL/HDL Ratio: 4
Triglycerides: 99 mg/dL (ref 0.0–149.0)
VLDL: 19.8 mg/dL (ref 0.0–40.0)

## 2022-02-11 LAB — HEMOGLOBIN A1C: Hgb A1c MFr Bld: 6.3 % (ref 4.6–6.5)

## 2022-02-11 MED ORDER — HYDROCODONE-ACETAMINOPHEN 5-325 MG PO TABS: 1.0000 | ORAL_TABLET | Freq: Four times a day (QID) | ORAL | 0 refills | Status: DC | PRN

## 2022-02-11 NOTE — Progress Notes (Signed)
Chief Complaint  Patient presents with   Annual Exam    Hip pain      Well Woman Madeline Chandler is here for a complete physical.   Her last physical was >1 year ago.  Current diet: in general, diet is fair. Current exercise: none. Weight is stable and she denies fatigue out of ordinary. Seatbelt? Yes Advanced directive? No  Health Maintenance Mammogram- Yes Colon cancer screening-Yes Shingrix- Yes Tetanus- Yes Hep C screening- Yes HIV screening- Yes  R hip pain 1 mo of pain. No inj or change in activity. Hx of LBP. Went to UC and here, tx'd for LBP but did not help. Radiates down thigh and into leg. No back pain today. No bruising, redness, swelling, bowel/bladder changes, neuro s/s's. Getting worse. Tried NSAIDs and Flexeril without relief.   Past Medical History:  Diagnosis Date   Ankle fracture 03/09/2019   Left   Arthritis    "hips" (10/28/2016)   Avascular necrosis of bones of both hips    Bulging lumbar disc    2   Cervical pain (neck)    De Quervain's tenosynovitis, bilateral    Ductal carcinoma in situ (DCIS) of right breast 1997; 2018   s/p chemotherapy and radiation treatment   Essential hypertension    08/22/11: BUN 15, creatinine 0.95, GFR 79      Generalized anxiety disorder    GERD (gastroesophageal reflux disease)    History of colonic polyps    History of migraine headaches    "none in the last few years; had them mostly in late 20's/mid 30's"   History of therapeutic radiation    Hypokalemia    Insomnia    Major depressive disorder    Mild neurocognitive disorder, unclear etiology 04/24/2020   OSA (obstructive sleep apnea) 02/19/2018   Awaiting CPAP machine as of 04/24/2020   Restless leg syndrome    Type 2 diabetes mellitus    Upper respiratory infection 07/16/2010     Past Surgical History:  Procedure Laterality Date   ABDOMINAL HYSTERECTOMY  2000s   ANTERIOR CERVICAL DECOMP/DISCECTOMY FUSION N/A 12/02/2015   Procedure: ANTERIOR CERVICAL  DECOMPRESSION/DISCECTOMY FUSION C4 - C7  3 LEVELS;  Surgeon: Melina Schools, MD;  Location: [facility];  Service: Orthopedics;  Laterality: N/A;   BACK SURGERY     BREAST BIOPSY Right 1997; 08/2016   BREAST LUMPECTOMY Right 1997   BREAST RECONSTRUCTION Right 07/07/2017   Procedure: RIGHT NIPPLE AEROLA RECONSTRUCTION WITH LOCAL FLAP FROM RIGHT GROIN;  Surgeon: Irene Limbo, MD;  Location: [facility];  Service: Plastics;  Laterality: Right;   BREAST REDUCTION SURGERY Left 03/07/2017   Procedure: LEFT BREAST REDUCTION FOR SYMMETRY;  Surgeon: Irene Limbo, MD;  Location: [facility];  Service: Plastics;  Laterality: Left;   COLONOSCOPY W/ POLYPECTOMY  2010   Dr Collene Mares   DILATION AND CURETTAGE OF UTERUS  1990s   HARDWARE REMOVAL Left 10/31/2019   Procedure: LEFT LEG MEDIAL HARDWARE REMOVAL;  Surgeon: Erle Crocker, MD;  Location: [facility];  Service: Orthopedics;  Laterality: Left;  PROCEDURE: LEFT LEG MEDIAL HARDWARE REMOVAL LENGTH OF SURGERY: 1 HOUR   JOINT REPLACEMENT     LATISSIMUS FLAP TO BREAST Right 10/28/2016   Procedure: LATISSIMUS FLAP TO BREAST WITH PLACEMENT OF TISSUE EXPANDER ON THE RIGHT;  Surgeon: Irene Limbo, MD;  Location: [facility];  Service: Plastics;  Laterality: Right;  RFNA   MASTECTOMY Right 10/28/2016   RECONSTRUCTION BREAST W/ LATISSIMUS DORSI  FLAP Right 10/28/2016    WITH PLACEMENT OF TISSUE EXPANDER    REMOVAL OF BILATERAL TISSUE EXPANDERS WITH PLACEMENT OF BILATERAL BREAST IMPLANTS Right 03/07/2017   Procedure: REMOVAL OF RIGHT BREAST TISSUE EXPANDER WITH PLACEMENT OF RIGHT BREAST IMPLANT;  Surgeon: Irene Limbo, MD;  Location: [facility];  Service: Plastics;  Laterality: Right;   TIBIA IM NAIL INSERTION Left 03/09/2019   Procedure: INTRAMEDULLARY (IM) NAIL TIBIAL;  Surgeon: Erle Crocker, MD;  Location: [facility];  Service: Orthopedics;  Laterality: Left;   TISSUE EXPANDER PLACEMENT Right 10/28/2016    Procedure: TISSUE EXPANDER;  Surgeon: Irene Limbo, MD;  Location: [facility];  Service: Plastics;  Laterality: Right;   TOTAL HIP ARTHROPLASTY Right 05/12/2015   Procedure: RIGHT TOTAL HIP ARTHROPLASTY ANTERIOR APPROACH;  Surgeon: Paralee Cancel, MD;  Location: [facility];  Service: Orthopedics;  Laterality: Right;   TOTAL HIP ARTHROPLASTY Left 06/23/2015   Procedure: LEFT TOTAL HIP ARTHROPLASTY ANTERIOR APPROACH;  Surgeon: Paralee Cancel, MD;  Location: [facility];  Service: Orthopedics;  Laterality: Left;   TOTAL MASTECTOMY Right 10/28/2016   Procedure: RIGHT MASTECTOMY;  Surgeon: Alphonsa Overall, MD;  Location: [facility];  Service: General;  Laterality: Right;    Medications  Current Outpatient Medications on File Prior to Visit  Medication Sig Dispense Refill   amLODipine (NORVASC) 5 MG tablet TAKE 1 TABLET BY MOUTH DAILY 30 tablet 0   amphetamine-dextroamphetamine (ADDERALL) 20 MG tablet Take 20 mg by mouth daily.  0   atorvastatin (LIPITOR) 40 MG tablet Take 1 tablet (40 mg total) by mouth daily. 90 tablet 2   azelastine (ASTELIN) 0.1 % nasal spray Place 2 sprays into both nostrils 2 (two) times daily. Use in each nostril as directed 30 mL 1   carvedilol (COREG) 25 MG tablet TAKE ONE TABLET BY MOUTH TWICE A DAY WITH A MEAL 60 tablet 2   cyclobenzaprine (FLEXERIL) 10 MG tablet Take 1 tablet (10 mg total) by mouth 3 (three) times daily as needed for muscle spasms. 30 tablet 0   DULoxetine (CYMBALTA) 30 MG capsule TAKE ONE CAPSULE BY MOUTH DAILY 30 capsule 3   glucose blood (ONETOUCH VERIO) test strip Use once daily to check blood sugar.  DX E11.9 100 each 1   letrozole (FEMARA) 2.5 MG tablet TAKE 1 TABLET BY MOUTH DAILY 30 tablet 0   levocetirizine (XYZAL) 5 MG tablet Take 1 tablet (5 mg total) by mouth every evening. 30 tablet 3   losartan-hydrochlorothiazide (HYZAAR) 100-25 MG tablet TAKE ONE TABLET BY MOUTH DAILY 30 tablet 2   meloxicam (MOBIC) 7.5 MG tablet Take 1 tablet (7.5 mg total) by mouth daily.  30 tablet 0   omeprazole (PRILOSEC) 40 MG capsule TAKE ONE CAPSULE BY MOUTH DAILY 30 capsule 1   ONETOUCH DELICA LANCETS FINE MISC Use as directed once daily to check blood sugar.  DXE11.9 100 each 1   Semaglutide, 2 MG/DOSE, 8 MG/3ML SOPN Inject 2 mg as directed once a week. 3 mL 3   traZODone (DESYREL) 50 MG tablet TAKE 1/2 TO 1 TABLET AT BEDTIME AS NEEDED FOR SLEEP 90 tablet 2   Allergies Allergies  Allergen Reactions   Prednisone Other (See Comments)    headaches    Review of Systems: Constitutional:  no unexpected weight changes Eye:  no recent significant change in vision Ear/Nose/Mouth/Throat:  Ears:  no recent change in hearing Nose/Mouth/Throat:  no complaints of nasal congestion, no sore throat Cardiovascular: no chest pain Respiratory:  no shortness of  breath Gastrointestinal:  no abdominal pain, no change in bowel habits GU:  Female: negative for dysuria or pelvic pain Musculoskeletal/Extremities: +R hip pain Integumentary (Skin/Breast):  no abnormal skin lesions reported Neurologic:  no headaches Endocrine:  denies fatigue  Exam BP 112/80   Pulse 75   Temp 98.2 F (36.8 C) (Oral)   Ht 5' 5.5" (1.664 m)   Wt 199 lb 6 oz (90.4 kg)   SpO2 98%   BMI 32.67 kg/m  General:  well developed, well nourished, in no apparent distress Skin:  no significant moles, warts, or growths Head:  no masses, lesions, or tenderness Eyes:  pupils equal and round, sclera anicteric without injection Ears:  canals without lesions, TMs shiny without retraction, no obvious effusion, no erythema Nose:  nares patent, septum midline, mucosa normal, and no drainage or sinus tenderness Throat/Pharynx:  lips and gingiva without lesion; tongue and uvula midline; non-inflamed pharynx; no exudates or postnasal drainage Neck: neck supple without adenopathy, thyromegaly, or masses Lungs:  clear to auscultation, breath sounds equal bilaterally, no respiratory distress Cardio:  regular rate and  rhythm, no LE edema Abdomen:  abdomen soft, nontender; bowel sounds normal; no masses or organomegaly Genital: Defer to GYN Musculoskeletal:  +TTP over R piriformis, ttp w hip flexion and ext rotation; neg logg roll, faddir, fabber; no ttp over greater troch Extremities:  no clubbing, cyanosis, or edema, no deformities, no skin discoloration Neuro:  gait normal; deep tendon reflexes normal and symmetric Psych: well oriented with normal range of affect and appropriate judgment/insight  Assessment and Plan  Well adult exam - Plan: CBC, Comprehensive metabolic panel, Lipid panel  Diabetes mellitus type 2 in obese ([place]) - Plan: Hemoglobin A1c, Microalbumin / creatinine urine ratio  Piriformis syndrome of right side - Plan: HYDROcodone-acetaminophen (NORCO) 5-325 MG tablet   Well 61 y.o. female. Counseled on diet and exercise. Advanced directive form provided today.  Other orders as above. Piriformis syndrome: Norco prn. NSAIDs/Tylenol, stretches/exercises, heat, ice. Sports med if no better.  Follow up in 6 mo or prn. The patient voiced understanding and agreement to the plan.  [place], DO 02/11/22 10:37 AM

## 2022-02-11 NOTE — Patient Instructions (Addendum)
Give [place] 2-3 business days to get the results of your labs back.   Keep the diet clean and stay active.  Please get me a copy of your advanced directive form at your convenience.   Let us know if you need anything.  Piriformis Syndrome Rehab It is normal to feel mild stretching, pulling, tightness, or discomfort as you do these exercises, but you should stop right away if you feel sudden pain or your pain gets worse.   Stretching and range of motion exercises These exercises warm up your muscles and joints and improve the movement and flexibility of your hip and pelvis. These exercises also help to relieve pain, numbness, and tingling. Exercise A: Hip rotators    Lie on your back on a firm surface. Pull your left / right knee toward your same shoulder with your left / right hand until your knee is pointing toward the ceiling. Hold your left / right ankle with your other hand. Keeping your knee steady, gently pull your left / right ankle toward your other shoulder until you feel a stretch in your buttocks. Hold this position for 30 seconds. Repeat 2 times. Complete this stretch 3 times per week. Exercise B: Hip extensors Lie on your back on a firm surface. Both of your legs should be straight. Pull your left / right knee to your chest. Hold your leg in this position by holding onto the back of your thigh or the front of your knee. Hold this position for 30 seconds. Slowly return to the starting position. Repeat 2 times. Complete this stretch 3 times per week.  Strengthening exercises These exercises build strength and endurance in your hip and thigh muscles. Endurance is the ability to use your muscles for a long time, even after they get tired. Exercise C: Straight leg raises (hip abductors)     Lie on your side with your left / right leg in the top position. Lie so your head, shoulder, knee, and hip line up. Bend your bottom knee to help you balance. Lift your top leg up 4-6 inches  (10-15 cm), keeping your toes pointed straight ahead. Hold this position for 1 second. Slowly lower your leg to the starting position. Let your muscles relax completely. Repeat for a total of 10 repetitions. Repeat 2 times. Complete this exercise 3 times per week. Exercise D: Hip abductors and rotators, quadruped    Get on your hands and knees on a firm, lightly padded surface. Your hands should be directly below your shoulders, and your knees should be directly below your hips. Lift your left / right knee out to the side. Keep your knee bent. Do not twist your body. Hold this position for 1 seconds. Slowly lower your leg. Repeat for a total of 10 repetitions.  Repeat 1 times. Complete this exercise 3 times per week. Exercise E: Straight leg raises (hip extensors) Lie on your abdomen on a bed or a firm surface with a pillow under your hips. Squeeze your buttock muscles and lift your left / right thigh off the bed. Do not let your back arch. Hold this position for 3 seconds. Slowly return to the starting position. Let your muscles relax completely before doing another repetition. Repeat 2 times. Complete this exercise 3 times per week.  This information is not intended to replace advice given to you by your health care provider. Make sure you discuss any questions you have with your health care provider. Document Released: 07/25/2005 Document Revised: 03/29/2016  Document Reviewed: 07/07/2015 Elsevier Interactive Patient Education  [employer].

## 2022-02-16 ENCOUNTER — Telehealth: Payer: Self-pay

## 2022-02-16 ENCOUNTER — Other Ambulatory Visit: Payer: Self-pay | Admitting: Family Medicine

## 2022-02-16 DIAGNOSIS — M5441 Lumbago with sciatica, right side: Secondary | ICD-10-CM

## 2022-02-16 DIAGNOSIS — G5701 Lesion of sciatic nerve, right lower limb: Secondary | ICD-10-CM

## 2022-02-16 MED ORDER — OXYCODONE HCL 5 MG PO TABS: 5.0000 mg | ORAL_TABLET | Freq: Four times a day (QID) | ORAL | 0 refills | Status: DC | PRN

## 2022-02-16 NOTE — Telephone Encounter (Signed)
Called the patient informed and referral done.

## 2022-02-16 NOTE — Telephone Encounter (Signed)
Pt had appointment on 7/7. She was prescribed hydrocodone for hip pain. Pt states she is almost out of pain medication and it's effectiveness has lessened. Pt would like to know what her next steps should be.

## 2022-02-16 NOTE — Telephone Encounter (Signed)
I'll send in a different one and please refer to sports med. Ty.

## 2022-02-16 NOTE — Addendum Note (Signed)
Addended by: Sharon Seller B on: 02/16/2022 04:59 PM   Modules accepted: Orders

## 2022-02-23 ENCOUNTER — Other Ambulatory Visit: Payer: Self-pay | Admitting: Family Medicine

## 2022-02-23 DIAGNOSIS — I10 Essential (primary) hypertension: Secondary | ICD-10-CM

## 2022-02-23 NOTE — Progress Notes (Signed)
Madeline Chandler Madeline Chandler [place] Madeline Chandler Phone: [phone]   Assessment and Plan:     1. Chronic bilateral low back pain with right-sided sciatica 2. DDD (degenerative disc disease), lumbar -Chronic with exacerbation, initial sports medicine visit - Years of low back pain with significant worsening in right-sided radicular symptoms that is limiting patient's ability to perform day-to-day activities, pain frequently >6/10, failure to improve with conservative therapies that included home exercises, NSAIDs, muscle relaxers - Concern for central pathology causing sciatica based on HPI, physical exam, x-ray images.  We will further evaluate with an MRI - Of note, patient has a diagnosis of DCIS and is planning for mastectomy on 03/15/2022 and will have a medical implant and she will not be able to get MRIs while undergoing treatment due to implant.  We will try to get MRI performed before this surgery - May continue to take Percocet prescribed by other provider for severe pain - X-ray obtained in clinic.  My interpretation: No acute fracture or vertebral collapse.  Moderate cortical changes surrounding L4 and L5.  Prior bilateral hip replacement partially visualized   Pertinent previous records reviewed include none   Follow Up: 3 days after MRI to review results and discuss treatment plan.  Could consider epidural based on findings   Subjective:   I, Madeline Chandler, am serving as a Education administrator for Doctor Madeline Chandler  Chief Complaint: low back pain   HPI:   02/24/2022 Patient is a 61 year old female complaining of low back pain. Patient states Hx of LBP. Went to UC and here, tx'd for LBP but did not help. Radiates down thigh and into leg. No back pain today. No bruising, redness, swelling, bowel/bladder changes, neuro s/s's. Getting worse. Tried NSAIDs and Flexeril without relief. 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   Current Outpatient Medications:    amLODipine (NORVASC) 5 MG tablet, TAKE 1 TABLET BY MOUTH DAILY, Disp: 30 tablet, Rfl: 0   amphetamine-dextroamphetamine (ADDERALL) 20 MG tablet, Take 20 mg by mouth daily., Disp: , Rfl: 0   atorvastatin (LIPITOR) 40 MG tablet, Take 1 tablet (40 mg total) by mouth daily., Disp: 90 tablet, Rfl: 2   azelastine (ASTELIN) 0.1 % nasal spray, Place 2 sprays into both nostrils 2 (two) times daily. Use in each nostril as directed, Disp: 30 mL, Rfl: 1   carvedilol (COREG) 25 MG tablet, TAKE ONE TABLET BY MOUTH TWICE A DAY WITH MEALS, Disp: 60 tablet, Rfl: 2   DULoxetine (CYMBALTA) 30 MG capsule, TAKE ONE CAPSULE BY MOUTH DAILY, Disp: 30 capsule, Rfl: 3   glucose blood (ONETOUCH VERIO) test strip, Use once daily to check blood sugar.  DX E11.9, Disp: 100 each, Rfl: 1   letrozole (FEMARA) 2.5 MG tablet, TAKE 1 TABLET BY MOUTH DAILY, Disp: 30 tablet, Rfl:  0   levocetirizine (XYZAL) 5 MG tablet, Take 1 tablet (5 mg total) by mouth every evening., Disp: 30 tablet, Rfl: 3   losartan-hydrochlorothiazide (HYZAAR) 100-25 MG tablet, TAKE ONE TABLET BY MOUTH DAILY, Disp: 30 tablet, Rfl: 2   omeprazole (PRILOSEC) 40 MG capsule, TAKE ONE CAPSULE BY MOUTH DAILY, Disp: 30 capsule, Rfl: 1   ONETOUCH DELICA LANCETS FINE MISC, Use as directed once daily to check blood sugar.  DXE11.9, Disp: 100 each, Rfl: 1   oxyCODONE (OXY  IR/ROXICODONE) 5 MG immediate release tablet, Take 1 tablet (5 mg total) by mouth every 6 (six) hours as needed for severe pain., Disp: 15 tablet, Rfl: 0   Semaglutide, 2 MG/DOSE, 8 MG/3ML SOPN, Inject 2 mg as directed once a week., Disp: 3 mL, Rfl: 3   traZODone (DESYREL) 50 MG tablet, TAKE 1/2 TO 1 TABLET AT BEDTIME AS NEEDED FOR SLEEP, Disp: 90 tablet, Rfl: 2   Objective:     Vitals:   02/24/22 1536  Pulse: 91  SpO2: 97%  Weight: 199 lb (90.3 kg)  Height: '5\' 5"'$  (1.651 m)      Body mass index is 33.12 kg/m.    Physical Exam:    Gen: Appears well, nad, nontoxic and pleasant Psych: Alert and oriented, appropriate mood and affect Neuro: sensation intact, strength is 5/5 in upper and lower extremities, muscle tone wnl Skin: no susupicious lesions or rashes  Back - Normal skin, Spine with normal alignment and no deformity.   No tenderness to vertebral process palpation.   Lumbar paraspinous muscles are mildly tender and without spasm Straight leg raise positive right Trendelenberg positive right   Electronically signed by:  Madeline Chandler D.[facility] 4:17 PM 02/24/22

## 2022-02-23 NOTE — H&P (Signed)
Subjective:     Patient ID: Madeline Chandler is a 61 y.o. female.   Follow-up   5 years post op right mastectomy with immediate LD implant based reconstruction. Returns to discuss left breast reconstruciton.   Screening MMG 2023 with left breast calcifications. Diagnostic MMG/US showed normal axilla, diffuse calcifications left breast span 12 cm medial to lateral and 9.1 cm AP. Calcifications extend to the subareolar region. Biopsies labled left breast inner and left breast outer both demonstrated grade 2/3 DCIS with comedonecrosis, calcifications and foci suspicious for microinvasion, ER+/PR-. Plan mastectomy.   Wt down over 40 lb since initial consult 2018. On Ozempic.    PMH significant for history right breast ca diagnosed 1997, treated with lumpectomy and adjuvant chemotherapy and radiation, 5 years tamoxifen. Routine follow up imaging 08/2016 with right breast distortion. MMG/US with area of concern right breast 1 o clock. 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 Final pathology with intermediate grade DCIS, margins clear. Prior ALND and no lymph tissue noted in specimen labeled right axillary contents.   Genetics negative. On Femara.    Prior to lumpectomy B cup, prior to reduction D cup on left. Right mastectomy 547 g left breast reduction 640 g.   HbA1c 07/2021 6.4    Lives alone. New puppy. Continues to work for [employer] from home.   Review of Systems      Objective:   Physical Exam   Chest:  no masses palpable bilateral chest Axillae benign Soft no contracture over right No lateral displacement no animation Some rippling right superior pole Sn to nipple R (reconstructed) 24  L 23.5 cm BW R 20 L 24 cm 19 cm Nipple to IMF R 13 cm L 12 cm     Assessment:     DCIS right breast, microscopic focus IDC UIQ ER/PR+, Her2+ History right breast ca, s/p lumpectomy, XRT S/p right mastectomy, LD/TE reconstruction S/p debridement  right mastectomy flap S/p removal TE, placement saline implant, left breast reduction S/p left NAC reconstruction with local flap, FTSG S/p tattoo areola skin graft Left breast DCIS    Plan:    Hold Mobic, ibuprofen 7-10 d prior to surgery. Patient scheduled to see Sports Medicine tomorrow regarding hip pain, sciatica- reviewed with patient will be unable to have MRI for few months post expander placement if this is recommended as part of work up. She is currently on oxycodone for this pain from her PCP.   Plan left mastectomy with tissue expander acellular dermis reconstruction.   Discussed immediate vs delayed, autologous vs implant based reconstruction. Reviewed incisions, drains, OR length, hospital stay and recovery for each. Discussed process of expansion and implant based risks including rupture, imaging surveillance for silicone implants, infection requiring surgery or removal, contracture. As she has implant based reconstruction over right she would like to proceed with TE reconstruction over left.    Reviewed SSM vs NSM, both will be asensate and not stimulate. Reviewed with both risks mastectomy flap necrosis requiring additional surgery. She has experienced this over right. Not a candidate for NSM per Dr. Ninfa Linden Plan SRM and continued Wise pattern scar.   Discussed use of acellular dermis in reconstruction, cadaveric source, incorporation over several weeks, risk that if has seroma or infection can act as additional nidus for infection if not incorporated.    Discussed prepectoral vs sub pectoral reconstruction. Discussed with patient and benefit of this is no animation deformity, may be less pain.  Risk may be more visible rippling over upper poles, greater need of ADM. Reviewed pre pectoral would require larger amount acellular dermis, more drains. Discussed any type reconstruction also risks long term displacement implant and visible rippling.   Additional risks including but not  limited to bleeding hematoma seroma damage to adjacent structures blood clots in legs or lungs need for additional procedures reviewed.   Drain teaching completed. Rx for oxycodone bactrim and robaxin given.   Natrelle Smooth Round High Profile Saline implant placed in right chest, 750 ml filled to 800 ml. REF [id number].

## 2022-02-24 ENCOUNTER — Ambulatory Visit (INDEPENDENT_AMBULATORY_CARE_PROVIDER_SITE_OTHER): Payer: 59

## 2022-02-24 ENCOUNTER — Ambulatory Visit (INDEPENDENT_AMBULATORY_CARE_PROVIDER_SITE_OTHER): Payer: 59 | Admitting: Sports Medicine

## 2022-02-24 VITALS — HR 91 | Ht 65.0 in | Wt 199.0 lb

## 2022-02-24 DIAGNOSIS — G8929 Other chronic pain: Secondary | ICD-10-CM

## 2022-02-24 DIAGNOSIS — M5441 Lumbago with sciatica, right side: Secondary | ICD-10-CM | POA: Diagnosis not present

## 2022-02-24 DIAGNOSIS — M5136 Other intervertebral disc degeneration, lumbar region: Secondary | ICD-10-CM | POA: Diagnosis not present

## 2022-02-24 NOTE — Patient Instructions (Addendum)
Good to see you  MRI referral  Follow up 3 days after your MRI to discuss results

## 2022-03-02 ENCOUNTER — Telehealth: Payer: Self-pay | Admitting: Family Medicine

## 2022-03-02 NOTE — Telephone Encounter (Signed)
Pt called asking if Dr. Nani Ravens could call in a medicine to help treat her arthritis that her othro doctor diagnosed her with.

## 2022-03-03 ENCOUNTER — Ambulatory Visit (INDEPENDENT_AMBULATORY_CARE_PROVIDER_SITE_OTHER): Payer: 59 | Admitting: Family Medicine

## 2022-03-03 ENCOUNTER — Encounter: Payer: Self-pay | Admitting: Family Medicine

## 2022-03-03 VITALS — BP 138/86 | HR 85 | Temp 98.2°F | Ht 65.5 in | Wt 194.1 lb

## 2022-03-03 DIAGNOSIS — M25551 Pain in right hip: Secondary | ICD-10-CM | POA: Diagnosis not present

## 2022-03-03 DIAGNOSIS — E669 Obesity, unspecified: Secondary | ICD-10-CM | POA: Diagnosis not present

## 2022-03-03 DIAGNOSIS — E1169 Type 2 diabetes mellitus with other specified complication: Secondary | ICD-10-CM | POA: Diagnosis not present

## 2022-03-03 MED ORDER — METHYLPREDNISOLONE ACETATE 40 MG/ML IJ SUSP
40.0000 mg | Freq: Once | INTRAMUSCULAR | Status: AC
  Administered 2022-03-03: 40 mg via INTRA_ARTICULAR

## 2022-03-03 MED ORDER — MELOXICAM 15 MG PO TABS: 15.0000 mg | ORAL_TABLET | Freq: Every day | ORAL | 0 refills | Status: DC | PRN

## 2022-03-03 MED ORDER — ATORVASTATIN CALCIUM 40 MG PO TABS: 40.0000 mg | ORAL_TABLET | Freq: Every day | ORAL | 2 refills | Status: DC

## 2022-03-03 NOTE — Telephone Encounter (Signed)
Called and scheduled visit today at 1:45.

## 2022-03-03 NOTE — Progress Notes (Signed)
Chief Complaint  Patient presents with   Arthritis    Subjective: Patient is a 61 y.o. female here for R hip pain.  1 month of right hip pain without any injury or change in activity.  There is no overlying redness, swelling, bruising.  She does have degenerative changes in the low back and has an MRI coming up.  She has not tried anything at home so far.  She has tried to stretch but questions how helpful this is.  No neurologic signs or symptoms.  Past Medical History:  Diagnosis Date   Ankle fracture 03/09/2019   Left   Arthritis    "hips" (10/28/2016)   Avascular necrosis of bones of both hips    Bulging lumbar disc    2   Cervical pain (neck)    De Quervain's tenosynovitis, bilateral    Ductal carcinoma in situ (DCIS) of right breast 1997; 2018   s/p chemotherapy and radiation treatment   Essential hypertension    08/22/11: BUN 15, creatinine 0.95, GFR 79      Generalized anxiety disorder    GERD (gastroesophageal reflux disease)    History of colonic polyps    History of migraine headaches    "none in the last few years; had them mostly in late 20's/mid 30's"   History of therapeutic radiation    Hypokalemia    Insomnia    Major depressive disorder    Mild neurocognitive disorder, unclear etiology 04/24/2020   OSA (obstructive sleep apnea) 02/19/2018   Awaiting CPAP machine as of 04/24/2020   Restless leg syndrome    Type 2 diabetes mellitus    Upper respiratory infection 07/16/2010    Objective: BP 138/86   Pulse 85   Temp 98.2 F (36.8 C) (Oral)   Ht 5' 5.5" (1.664 m)   Wt 194 lb 2 oz (88.1 kg)   SpO2 97%   BMI 31.81 kg/m  General: Awake, appears stated age MSK: TTP over the greater trochanter, no crepitus, no TTP over the lumbar spine region, no deformity or ecchymosis Neuro: Gait is normal, negative straight leg bilaterally Lungs:  No accessory muscle use Psych: Age appropriate judgment and insight, normal affect and mood  Procedure note: Greater  trochanteric bursa injection Verbal consent obtained. The area of interest was palpated and demarcated with an otoscope speculum. It was cleaned with an alcohol swab. Freeze spray was used. A 27 g needle was inserted at a perpendicular angle through the area of interested. The plunger was withdrawn to ensure our placement was not in a vessel. 2 mL of 1% lidocaine without epi and 40 mg of Depo-Medrol was injected. A bandaid was placed. The patient tolerated the procedure well.  There were no complications noted.   Assessment and Plan: Right hip pain - Plan: PR DRAIN/INJECT LARGE JOINT/BURSA, methylPREDNISolone acetate (DEPO-MEDROL) injection 40 mg  Diabetes mellitus type 2 in obese (HCC) - Plan: atorvastatin (LIPITOR) 40 MG tablet  She was just prescribed oxycodone by her surgeon who is going to do the procedure for breast cancer in the next month.  Thus, we will hold off on opiates as it likely would not be filled.  Meloxicam daily as needed, Tylenol, and injection today.  Stretches and exercises provided.  Heat, ice, follow-up if no improvement. The patient voiced understanding and agreement to the plan.  Prentiss, DO 03/03/22  2:31 PM

## 2022-03-03 NOTE — Patient Instructions (Signed)
Ice/cold pack over area for 10-15 min twice daily.  OK to take Tylenol 1000 mg (2 extra strength tabs) or 975 mg (3 regular strength tabs) every 6 hours as needed.  Let us know if you need anything.  Piriformis Syndrome Rehab It is normal to feel mild stretching, pulling, tightness, or discomfort as you do these exercises, but you should stop right away if you feel sudden pain or your pain gets worse.   Stretching and range of motion exercises These exercises warm up your muscles and joints and improve the movement and flexibility of your hip and pelvis. These exercises also help to relieve pain, numbness, and tingling. Exercise A: Hip rotators    Lie on your back on a firm surface. Pull your left / right knee toward your same shoulder with your left / right hand until your knee is pointing toward the ceiling. Hold your left / right ankle with your other hand. Keeping your knee steady, gently pull your left / right ankle toward your other shoulder until you feel a stretch in your buttocks. Hold this position for 30 seconds. Repeat 2 times. Complete this stretch 3 times per week. Exercise B: Hip extensors Lie on your back on a firm surface. Both of your legs should be straight. Pull your left / right knee to your chest. Hold your leg in this position by holding onto the back of your thigh or the front of your knee. Hold this position for 30 seconds. Slowly return to the starting position. Repeat 2 times. Complete this stretch 3 times per week.  Strengthening exercises These exercises build strength and endurance in your hip and thigh muscles. Endurance is the ability to use your muscles for a long time, even after they get tired. Exercise C: Straight leg raises (hip abductors)     Lie on your side with your left / right leg in the top position. Lie so your head, shoulder, knee, and hip line up. Bend your bottom knee to help you balance. Lift your top leg up 4-6 inches (10-15 cm),  keeping your toes pointed straight ahead. Hold this position for 1 second. Slowly lower your leg to the starting position. Let your muscles relax completely. Repeat for a total of 10 repetitions. Repeat 2 times. Complete this exercise 3 times per week. Exercise D: Hip abductors and rotators, quadruped    Get on your hands and knees on a firm, lightly padded surface. Your hands should be directly below your shoulders, and your knees should be directly below your hips. Lift your left / right knee out to the side. Keep your knee bent. Do not twist your body. Hold this position for 1 seconds. Slowly lower your leg. Repeat for a total of 10 repetitions.  Repeat 1 times. Complete this exercise 3 times per week. Exercise E: Straight leg raises (hip extensors) Lie on your abdomen on a bed or a firm surface with a pillow under your hips. Squeeze your buttock muscles and lift your left / right thigh off the bed. Do not let your back arch. Hold this position for 3 seconds. Slowly return to the starting position. Let your muscles relax completely before doing another repetition. Repeat 2 times. Complete this exercise 3 times per week.  This information is not intended to replace advice given to you by your health care provider. Make sure you discuss any questions you have with your health care provider. Document Released: 07/25/2005 Document Revised: 03/29/2016 Document Reviewed: 07/07/2015 Elsevier Interactive  Patient Education  [employer].

## 2022-03-03 NOTE — Telephone Encounter (Signed)
Have her sched an appt plz, there will be some options. Ty.

## 2022-03-04 ENCOUNTER — Other Ambulatory Visit: Payer: Self-pay | Admitting: Medical

## 2022-03-04 ENCOUNTER — Other Ambulatory Visit: Payer: Self-pay | Admitting: Family Medicine

## 2022-03-04 DIAGNOSIS — C50211 Malignant neoplasm of upper-inner quadrant of right female breast: Secondary | ICD-10-CM

## 2022-03-08 ENCOUNTER — Encounter (HOSPITAL_BASED_OUTPATIENT_CLINIC_OR_DEPARTMENT_OTHER): Payer: Self-pay | Admitting: Surgery

## 2022-03-08 ENCOUNTER — Telehealth: Payer: Self-pay

## 2022-03-08 ENCOUNTER — Other Ambulatory Visit: Payer: Self-pay

## 2022-03-08 NOTE — Telephone Encounter (Signed)
Talked to patient today about her pain and how she has been doing since seeing her last. Patient said that her pain will subside with the pain medication but has to take it religiously every 4 hours for the pain to be better. Once the medication is warn off she will have pain that will radiate down the left leg that will now go to her knee and sometimes her ankle. Patient states the pain can get to about a 7/10.

## 2022-03-09 ENCOUNTER — Telehealth: Payer: Self-pay | Admitting: Family Medicine

## 2022-03-09 NOTE — Telephone Encounter (Signed)
Called the patient back and informed her that she should call her surgeon for these refills. She has taking all of them for her arthritis pain, they were given to her for pain after her surgery on 8/8.

## 2022-03-09 NOTE — Telephone Encounter (Signed)
Pt stated she was given oxycodone (OXY-IR) 5 MG capsule for after her surgery but she ended up taking almost all if it do to pain and now does not have any left for her after her surgery. Advised pt she could contact her surgeon for refills but she stated she was not sure if he would give her anymore because the rx she had was intended for after the surgery. She would like to know if pcp is able to fill this for her. Please advise.   [facility] [id number] [place] [address]  Phone:  [phone]  Fax:  [phone]

## 2022-03-10 ENCOUNTER — Ambulatory Visit
Admission: RE | Admit: 2022-03-10 | Discharge: 2022-03-10 | Disposition: A | Discharge status: Home / Self Care | Payer: 59 | Source: Ambulatory Visit | Attending: Sports Medicine | Admitting: Sports Medicine

## 2022-03-10 DIAGNOSIS — G8929 Other chronic pain: Secondary | ICD-10-CM

## 2022-03-10 DIAGNOSIS — M5136 Other intervertebral disc degeneration, lumbar region: Secondary | ICD-10-CM

## 2022-03-13 ENCOUNTER — Other Ambulatory Visit: Payer: 59

## 2022-03-14 ENCOUNTER — Encounter (HOSPITAL_BASED_OUTPATIENT_CLINIC_OR_DEPARTMENT_OTHER)
Admission: RE | Admit: 2022-03-14 | Discharge: 2022-03-14 | Disposition: A | Discharge status: Home / Self Care | Payer: 59 | Source: Ambulatory Visit | Attending: Surgery | Admitting: Surgery

## 2022-03-14 DIAGNOSIS — I10 Essential (primary) hypertension: Secondary | ICD-10-CM | POA: Diagnosis not present

## 2022-03-14 DIAGNOSIS — G4733 Obstructive sleep apnea (adult) (pediatric): Secondary | ICD-10-CM | POA: Diagnosis not present

## 2022-03-14 DIAGNOSIS — Z9011 Acquired absence of right breast and nipple: Secondary | ICD-10-CM | POA: Diagnosis not present

## 2022-03-14 DIAGNOSIS — Z170 Estrogen receptor positive status [ER+]: Secondary | ICD-10-CM | POA: Diagnosis not present

## 2022-03-14 DIAGNOSIS — Z9221 Personal history of antineoplastic chemotherapy: Secondary | ICD-10-CM | POA: Diagnosis not present

## 2022-03-14 DIAGNOSIS — Z923 Personal history of irradiation: Secondary | ICD-10-CM | POA: Diagnosis not present

## 2022-03-14 DIAGNOSIS — Z01812 Encounter for preprocedural laboratory examination: Secondary | ICD-10-CM | POA: Diagnosis not present

## 2022-03-14 DIAGNOSIS — K219 Gastro-esophageal reflux disease without esophagitis: Secondary | ICD-10-CM | POA: Diagnosis not present

## 2022-03-14 DIAGNOSIS — F419 Anxiety disorder, unspecified: Secondary | ICD-10-CM | POA: Diagnosis not present

## 2022-03-14 DIAGNOSIS — F32A Depression, unspecified: Secondary | ICD-10-CM | POA: Diagnosis not present

## 2022-03-14 DIAGNOSIS — E119 Type 2 diabetes mellitus without complications: Secondary | ICD-10-CM | POA: Diagnosis not present

## 2022-03-14 DIAGNOSIS — D0512 Intraductal carcinoma in situ of left breast: Secondary | ICD-10-CM | POA: Diagnosis present

## 2022-03-14 LAB — BASIC METABOLIC PANEL
Anion gap: 8 (ref 5–15)
BUN: 15 mg/dL (ref 8–23)
CO2: 34 mmol/L — ABNORMAL HIGH (ref 22–32)
Calcium: 10.1 mg/dL (ref 8.9–10.3)
Chloride: 96 mmol/L — ABNORMAL LOW (ref 98–111)
Creatinine, Ser: 1.17 mg/dL — ABNORMAL HIGH (ref 0.44–1.00)
GFR, Estimated: 53 mL/min — ABNORMAL LOW (ref >60)
Glucose, Bld: 111 mg/dL — ABNORMAL HIGH (ref 70–99)
Potassium: 3.6 mmol/L (ref 3.5–5.1)
Sodium: 138 mmol/L (ref 135–145)

## 2022-03-14 NOTE — H&P (Signed)
REFERRING PHYSICIAN:  Cyril Mourning, MD   PROVIDER:  Beverlee Nims, MD   MRN: [id number] DOB: 03-31-1961  Subjective  Chief Complaint: New Consultation (Left Breast Cancer)     History of Present Illness: Madeline Chandler is a 61 y.o. female who is seen as an office consultation for evaluation of New Consultation (Left Breast Cancer)  .     This is a 61 year old female with extensive past medical history of a right breast cancer.  Her initial surgery was in 1997 by Dr. Lucia Gaskins.  She recurred in the right breast and underwent a right breast mastectomy with immediate reconstruction.  She went on to have further left breast surgery by Dr. Iran Planas with a flap reconstruction and implant as well as reduction of the left breast. She was now found on screening mammography to have extensive calcifications in the left breast.  This measured at least 12 cm x 9 cm in size.  2 separate biopsies were performed both showing DCIS with questionable focal microinvasion.  It was 95% ER positive, 0% PR positive.  She is otherwise been doing fairly well except for issues regarding her hips and her previous hip surgery.  She has no cardiopulmonary issues.  She denies nipple discharge on the left.    Review of Systems: A complete review of systems was obtained from the patient.  I have reviewed this information and discussed as appropriate with the patient.  See HPI as well for other ROS.   ROS    Medical History: Past Medical History      Past Medical History:  Diagnosis Date   Anxiety     Arthritis     Diabetes mellitus without complication (CMS-HCC)     GERD (gastroesophageal reflux disease)     History of cancer     Hyperlipidemia     Hypertension     Sleep apnea             Patient Active Problem List  Diagnosis   Anxiety state   Cervicalgia   Chronic cough   Class 2 obesity due to excess calories without serious comorbidity with body mass index (BMI) of 39.0 to 39.9 in adult    De Quervain's tenosynovitis, bilateral   Diabetes mellitus type 2 in obese (CMS-HCC)   Ductal carcinoma in situ (DCIS) of right breast   Fracture of lower leg   GERD (gastroesophageal reflux disease)   Greater trochanteric bursitis of left hip   History of breast cancer   History of colonic polyps   History of therapeutic radiation   Essential hypertension   Hypokalemia   Insomnia   Left wrist pain   Leg swelling   Leiomyoma of uterus, unspecified   Major depressive disorder   Malignant tumor of breast (CMS-HCC)   Mild neurocognitive disorder   Moderate episode of recurrent major depressive disorder (CMS-HCC)   Muscle spasm   Nonspecific abnormal electrocardiogram (ECG) (EKG)   OSA (obstructive sleep apnea)   Other abnormal glucose   Pain in joint, shoulder region   Peripheral edema   Preoperative clearance   S/P hip replacement   Skin flap necrosis (CMS-HCC)   Soft tissue mass   Syncope      Past Surgical History       Past Surgical History:  Procedure Laterality Date   DILATION AND CURETTAGE, DIAGNOSTIC / THERAPEUTIC   1990   HYSTERECTOMY   2000   COLONOSCOPY   2010   Back Surgery  2017   Breast Surgery  Right      1997, 2014, 2018   JOINT REPLACEMENT Right          Allergies       Allergies  Allergen Reactions   Prednisone Headache      headaches              Current Outpatient Medications on File Prior to Visit  Medication Sig Dispense Refill   amLODIPine (NORVASC) 5 MG tablet amlodipine 5 mg tablet       atorvastatin (LIPITOR) 40 MG tablet atorvastatin 40 mg tablet       azelastine (ASTELIN) 137 mcg nasal spray azelastine 137 mcg (0.1 %) nasal spray aerosol       carvediloL (COREG) 25 MG tablet carvedilol 25 mg tablet       dextroamphetamine-amphetamine (ADDERALL) 12.5 MG tablet Take 20 mg by mouth once daily       DULoxetine (CYMBALTA) 30 MG DR capsule duloxetine 30 mg capsule,delayed release       levocetirizine (XYZAL) 5 MG tablet  levocetirizine 5 mg tablet       losartan-hydroCHLOROthiazide (HYZAAR) 100-25 mg tablet losartan 100 mg-hydrochlorothiazide 25 mg tablet       pantoprazole (PROTONIX) 40 MG DR tablet Take by mouth       traZODone (DESYREL) 50 MG tablet trazodone 50 mg tablet        No current facility-administered medications on file prior to visit.      Family History  History reviewed. No pertinent family history.      Social History       Tobacco Use  Smoking Status Never  Smokeless Tobacco Never      Social History  Social History        Socioeconomic History   Marital status: Single  Tobacco Use   Smoking status: Never   Smokeless tobacco: Never  Substance and Sexual Activity   Alcohol use: Not Currently   Drug use: Never        Objective:       Vitals:     BP: (!) 160/90  Pulse: 89  Temp: 36.5 C (97.7 F)  SpO2: 96%  Weight: 90.6 kg (199 lb 12.8 oz)  Height: 166.4 cm (5' 5.5")    Body mass index is 32.74 kg/m.   Physical Exam    She appears well on exam   She has scars and implant on her right chest from her reconstruction on that side. On the left breast, she has her scars from her reduction.  There are no palpable masses.  The nipple areolar complex is normal.   There is no axillary adenopathy     Labs, Imaging and Diagnostic Testing: I have reviewed her mammogram, ultrasound, and pathology results regarding the left breast cancer   Assessment and Plan:     Diagnoses and all orders for this visit:   Neoplasm of left breast, primary tumor staging category Tis: ductal carcinoma in situ (DCIS) -     Ambulatory Referral to Oncology-Medical -     Ambulatory Referral to Plastic Surgery       At this point, the patient is already decided that she wants to proceed with a left mastectomy and reconstruction.  She was accompanied by her sister today.  We went over her pathology results.  From my standpoint, I would plan on a total mastectomy with injection of mag  trace to map or lymph nodes in case the final pathology showed  invasive cancer and we could then go back and excise lymph nodes if needed.  She will be referred to the cancer center.  She saw Dr. Benay Spice in the past but will likely be seeing one of the breast oncologist at this time.  I will refer her to Dr. Iran Planas as well

## 2022-03-14 NOTE — Progress Notes (Signed)

## 2022-03-15 ENCOUNTER — Ambulatory Visit (HOSPITAL_BASED_OUTPATIENT_CLINIC_OR_DEPARTMENT_OTHER): Payer: 59 | Admitting: Anesthesiology

## 2022-03-15 ENCOUNTER — Encounter (HOSPITAL_BASED_OUTPATIENT_CLINIC_OR_DEPARTMENT_OTHER)
Admission: RE | Disposition: A | Discharge status: Home / Self Care | Payer: Self-pay | Source: Home / Self Care | Attending: Plastic Surgery

## 2022-03-15 ENCOUNTER — Ambulatory Visit (HOSPITAL_BASED_OUTPATIENT_CLINIC_OR_DEPARTMENT_OTHER)
Admission: RE | Admit: 2022-03-15 | Discharge: 2022-03-16 | Disposition: A | Discharge status: Home / Self Care | Payer: 59 | Attending: Plastic Surgery | Admitting: Plastic Surgery

## 2022-03-15 ENCOUNTER — Other Ambulatory Visit: Payer: Self-pay

## 2022-03-15 ENCOUNTER — Encounter (HOSPITAL_BASED_OUTPATIENT_CLINIC_OR_DEPARTMENT_OTHER): Payer: Self-pay | Admitting: Surgery

## 2022-03-15 DIAGNOSIS — F419 Anxiety disorder, unspecified: Secondary | ICD-10-CM | POA: Insufficient documentation

## 2022-03-15 DIAGNOSIS — F32A Depression, unspecified: Secondary | ICD-10-CM | POA: Insufficient documentation

## 2022-03-15 DIAGNOSIS — D0512 Intraductal carcinoma in situ of left breast: Secondary | ICD-10-CM

## 2022-03-15 DIAGNOSIS — F418 Other specified anxiety disorders: Secondary | ICD-10-CM

## 2022-03-15 DIAGNOSIS — I10 Essential (primary) hypertension: Secondary | ICD-10-CM | POA: Diagnosis not present

## 2022-03-15 DIAGNOSIS — E1169 Type 2 diabetes mellitus with other specified complication: Secondary | ICD-10-CM

## 2022-03-15 DIAGNOSIS — E119 Type 2 diabetes mellitus without complications: Secondary | ICD-10-CM | POA: Insufficient documentation

## 2022-03-15 DIAGNOSIS — Z9011 Acquired absence of right breast and nipple: Secondary | ICD-10-CM | POA: Insufficient documentation

## 2022-03-15 DIAGNOSIS — K219 Gastro-esophageal reflux disease without esophagitis: Secondary | ICD-10-CM | POA: Insufficient documentation

## 2022-03-15 DIAGNOSIS — Z421 Encounter for breast reconstruction following mastectomy: Secondary | ICD-10-CM | POA: Diagnosis not present

## 2022-03-15 DIAGNOSIS — Z9221 Personal history of antineoplastic chemotherapy: Secondary | ICD-10-CM | POA: Insufficient documentation

## 2022-03-15 DIAGNOSIS — Z923 Personal history of irradiation: Secondary | ICD-10-CM | POA: Insufficient documentation

## 2022-03-15 DIAGNOSIS — Z170 Estrogen receptor positive status [ER+]: Secondary | ICD-10-CM | POA: Insufficient documentation

## 2022-03-15 DIAGNOSIS — G4733 Obstructive sleep apnea (adult) (pediatric): Secondary | ICD-10-CM | POA: Insufficient documentation

## 2022-03-15 HISTORY — PX: SIMPLE MASTECTOMY WITH AXILLARY SENTINEL NODE BIOPSY: SHX6098

## 2022-03-15 HISTORY — PX: BREAST RECONSTRUCTION WITH PLACEMENT OF TISSUE EXPANDER AND ALLODERM: SHX6805

## 2022-03-15 LAB — GLUCOSE, CAPILLARY: Glucose-Capillary: 133 mg/dL — ABNORMAL HIGH (ref 70–99)

## 2022-03-15 SURGERY — SIMPLE MASTECTOMY
Anesthesia: General | Site: Breast | Laterality: Left

## 2022-03-15 MED ORDER — SODIUM CHLORIDE 0.9 % IV SOLN
INTRAVENOUS | Status: AC
  Filled 2022-03-15: qty 10

## 2022-03-15 MED ORDER — CEFAZOLIN SODIUM-DEXTROSE 2-4 GM/100ML-% IV SOLN
INTRAVENOUS | Status: AC
  Filled 2022-03-15: qty 100

## 2022-03-15 MED ORDER — AMISULPRIDE (ANTIEMETIC) 5 MG/2ML IV SOLN: 10.0000 mg | Freq: Once | INTRAVENOUS | Status: DC | PRN

## 2022-03-15 MED ORDER — FENTANYL CITRATE (PF) 100 MCG/2ML IJ SOLN
INTRAMUSCULAR | Status: AC
  Filled 2022-03-15: qty 2

## 2022-03-15 MED ORDER — METHOCARBAMOL 500 MG PO TABS
500.0000 mg | ORAL_TABLET | Freq: Four times a day (QID) | ORAL | Status: DC | PRN
  Administered 2022-03-15 – 2022-03-16 (×2): 500 mg via ORAL
  Filled 2022-03-15 (×2): qty 1

## 2022-03-15 MED ORDER — ENOXAPARIN SODIUM 40 MG/0.4ML IJ SOSY
40.0000 mg | PREFILLED_SYRINGE | INTRAMUSCULAR | Status: DC
Start: 2022-03-16 — End: 2022-03-16
  Administered 2022-03-16: 40 mg via SUBCUTANEOUS
  Filled 2022-03-15: qty 0.4

## 2022-03-15 MED ORDER — PROPOFOL 10 MG/ML IV BOLUS
INTRAVENOUS | Status: DC | PRN
  Administered 2022-03-15: 150 mg via INTRAVENOUS

## 2022-03-15 MED ORDER — SUCCINYLCHOLINE CHLORIDE 200 MG/10ML IV SOSY
PREFILLED_SYRINGE | INTRAVENOUS | Status: DC | PRN
  Administered 2022-03-15: 80 mg via INTRAVENOUS

## 2022-03-15 MED ORDER — ACETAMINOPHEN 500 MG PO TABS
ORAL_TABLET | ORAL | Status: AC
  Filled 2022-03-15: qty 2

## 2022-03-15 MED ORDER — SODIUM CHLORIDE 0.9 % IV SOLN: INTRAVENOUS | Status: DC | PRN

## 2022-03-15 MED ORDER — SUGAMMADEX SODIUM 200 MG/2ML IV SOLN
INTRAVENOUS | Status: DC | PRN
  Administered 2022-03-15: 200 mg via INTRAVENOUS

## 2022-03-15 MED ORDER — EPHEDRINE SULFATE (PRESSORS) 50 MG/ML IJ SOLN
INTRAMUSCULAR | Status: DC | PRN
  Administered 2022-03-15: 15 mg via INTRAVENOUS

## 2022-03-15 MED ORDER — FENTANYL CITRATE (PF) 100 MCG/2ML IJ SOLN
25.0000 ug | INTRAMUSCULAR | Status: DC | PRN
  Administered 2022-03-15 (×3): 50 ug via INTRAVENOUS

## 2022-03-15 MED ORDER — EPHEDRINE 5 MG/ML INJ
INTRAVENOUS | Status: AC
  Filled 2022-03-15: qty 5

## 2022-03-15 MED ORDER — POVIDONE-IODINE 10 % EX SOLN
CUTANEOUS | Status: DC | PRN
  Administered 2022-03-15: 1 via TOPICAL

## 2022-03-15 MED ORDER — LACTATED RINGERS IV SOLN
INTRAVENOUS | Status: DC
Start: 2022-03-15 — End: 2022-03-15

## 2022-03-15 MED ORDER — PHENYLEPHRINE HCL (PRESSORS) 10 MG/ML IV SOLN
INTRAVENOUS | Status: AC
  Filled 2022-03-15: qty 1

## 2022-03-15 MED ORDER — ATORVASTATIN CALCIUM 40 MG PO TABS: 40.0000 mg | ORAL_TABLET | Freq: Every day | ORAL | Status: DC

## 2022-03-15 MED ORDER — CHLORHEXIDINE GLUCONATE CLOTH 2 % EX PADS: 6.0000 | MEDICATED_PAD | Freq: Once | CUTANEOUS | Status: DC

## 2022-03-15 MED ORDER — FENTANYL CITRATE (PF) 100 MCG/2ML IJ SOLN
INTRAMUSCULAR | Status: DC | PRN
  Administered 2022-03-15: 100 ug via INTRAVENOUS

## 2022-03-15 MED ORDER — CARVEDILOL 25 MG PO TABS: 25.0000 mg | ORAL_TABLET | Freq: Two times a day (BID) | ORAL | Status: DC

## 2022-03-15 MED ORDER — MIDAZOLAM HCL 2 MG/2ML IJ SOLN
INTRAMUSCULAR | Status: AC
  Filled 2022-03-15: qty 2

## 2022-03-15 MED ORDER — OXYCODONE HCL 5 MG/5ML PO SOLN: 5.0000 mg | Freq: Once | ORAL | Status: DC | PRN

## 2022-03-15 MED ORDER — LOSARTAN POTASSIUM-HCTZ 100-25 MG PO TABS
1.0000 | ORAL_TABLET | Freq: Every day | ORAL | Status: DC
Start: 2022-03-15 — End: 2022-03-16

## 2022-03-15 MED ORDER — PHENYLEPHRINE HCL-NACL 20-0.9 MG/250ML-% IV SOLN
INTRAVENOUS | Status: DC | PRN
  Administered 2022-03-15: 40 ug/min via INTRAVENOUS

## 2022-03-15 MED ORDER — CEFAZOLIN SODIUM-DEXTROSE 2-4 GM/100ML-% IV SOLN
2.0000 g | INTRAVENOUS | Status: AC
  Administered 2022-03-15: 2 g via INTRAVENOUS

## 2022-03-15 MED ORDER — OXYCODONE HCL 5 MG PO TABS: 5.0000 mg | ORAL_TABLET | Freq: Once | ORAL | Status: DC | PRN

## 2022-03-15 MED ORDER — DEXAMETHASONE SODIUM PHOSPHATE 4 MG/ML IJ SOLN
INTRAMUSCULAR | Status: DC | PRN
  Administered 2022-03-15: 8 mg via INTRAVENOUS

## 2022-03-15 MED ORDER — POTASSIUM CHLORIDE IN NACL 20-0.9 MEQ/L-% IV SOLN
INTRAVENOUS | Status: DC
  Filled 2022-03-15: qty 1000

## 2022-03-15 MED ORDER — OXYCODONE HCL 5 MG PO TABS: 5.0000 mg | ORAL_TABLET | ORAL | 0 refills | Status: AC | PRN

## 2022-03-15 MED ORDER — ONDANSETRON HCL 4 MG/2ML IJ SOLN
INTRAMUSCULAR | Status: DC | PRN
  Administered 2022-03-15: 4 mg via INTRAVENOUS

## 2022-03-15 MED ORDER — PHENYLEPHRINE 80 MCG/ML (10ML) SYRINGE FOR IV PUSH (FOR BLOOD PRESSURE SUPPORT)
PREFILLED_SYRINGE | INTRAVENOUS | Status: DC | PRN
  Administered 2022-03-15: 160 ug via INTRAVENOUS

## 2022-03-15 MED ORDER — FENTANYL CITRATE (PF) 100 MCG/2ML IJ SOLN
50.0000 ug | Freq: Once | INTRAMUSCULAR | Status: AC
  Administered 2022-03-15: 50 ug via INTRAVENOUS

## 2022-03-15 MED ORDER — PHENYLEPHRINE 80 MCG/ML (10ML) SYRINGE FOR IV PUSH (FOR BLOOD PRESSURE SUPPORT)
PREFILLED_SYRINGE | INTRAVENOUS | Status: AC
  Filled 2022-03-15: qty 10

## 2022-03-15 MED ORDER — CEFAZOLIN SODIUM-DEXTROSE 1-4 GM/50ML-% IV SOLN
1.0000 g | Freq: Three times a day (TID) | INTRAVENOUS | Status: AC
  Administered 2022-03-15 – 2022-03-16 (×2): 1 g via INTRAVENOUS
  Filled 2022-03-15 (×2): qty 50

## 2022-03-15 MED ORDER — TRAZODONE 25 MG HALF TABLET
25.0000 mg | ORAL_TABLET | Freq: Every day | ORAL | Status: DC
Start: 2022-03-15 — End: 2022-03-16

## 2022-03-15 MED ORDER — MAGTRACE LYMPHATIC TRACER
INTRAMUSCULAR | Status: DC | PRN
  Administered 2022-03-15: 2 mL via INTRAMUSCULAR

## 2022-03-15 MED ORDER — PANTOPRAZOLE SODIUM 40 MG PO TBEC: 80.0000 mg | DELAYED_RELEASE_TABLET | Freq: Every day | ORAL | Status: DC

## 2022-03-15 MED ORDER — ONDANSETRON HCL 4 MG/2ML IJ SOLN
INTRAMUSCULAR | Status: AC
  Filled 2022-03-15: qty 2

## 2022-03-15 MED ORDER — ONDANSETRON 4 MG PO TBDP: 4.0000 mg | ORAL_TABLET | Freq: Four times a day (QID) | ORAL | Status: DC | PRN

## 2022-03-15 MED ORDER — ACETAMINOPHEN 500 MG PO TABS
1000.0000 mg | ORAL_TABLET | Freq: Once | ORAL | Status: AC
Start: 2022-03-15 — End: 2022-03-15
  Administered 2022-03-15: 1000 mg via ORAL

## 2022-03-15 MED ORDER — ROCURONIUM BROMIDE 100 MG/10ML IV SOLN
INTRAVENOUS | Status: DC | PRN
  Administered 2022-03-15: 20 mg via INTRAVENOUS
  Administered 2022-03-15: 10 mg via INTRAVENOUS

## 2022-03-15 MED ORDER — MIDAZOLAM HCL 2 MG/2ML IJ SOLN
2.0000 mg | Freq: Once | INTRAMUSCULAR | Status: AC
  Administered 2022-03-15: 1 mg via INTRAVENOUS

## 2022-03-15 MED ORDER — ONDANSETRON HCL 4 MG/2ML IJ SOLN: 4.0000 mg | Freq: Once | INTRAMUSCULAR | Status: DC | PRN

## 2022-03-15 MED ORDER — OXYCODONE HCL 5 MG PO TABS
5.0000 mg | ORAL_TABLET | ORAL | Status: DC | PRN
  Administered 2022-03-15: 5 mg via ORAL
  Filled 2022-03-15: qty 1

## 2022-03-15 MED ORDER — LIDOCAINE 2% (20 MG/ML) 5 ML SYRINGE
INTRAMUSCULAR | Status: AC
  Filled 2022-03-15: qty 5

## 2022-03-15 MED ORDER — POTASSIUM CHLORIDE IN NACL 20-0.45 MEQ/L-% IV SOLN: INTRAVENOUS | Status: DC

## 2022-03-15 MED ORDER — DEXAMETHASONE SODIUM PHOSPHATE 10 MG/ML IJ SOLN
INTRAMUSCULAR | Status: AC
  Filled 2022-03-15: qty 1

## 2022-03-15 MED ORDER — DULOXETINE HCL 30 MG PO CPEP
30.0000 mg | ORAL_CAPSULE | Freq: Every day | ORAL | Status: DC
Start: 2022-03-15 — End: 2022-03-16

## 2022-03-15 MED ORDER — BUPIVACAINE-EPINEPHRINE (PF) 0.5% -1:200000 IJ SOLN
INTRAMUSCULAR | Status: AC
  Filled 2022-03-15: qty 30

## 2022-03-15 MED ORDER — ONDANSETRON HCL 4 MG/2ML IJ SOLN: 4.0000 mg | Freq: Four times a day (QID) | INTRAMUSCULAR | Status: DC | PRN

## 2022-03-15 MED ORDER — HYDROMORPHONE HCL 1 MG/ML IJ SOLN: 0.5000 mg | INTRAMUSCULAR | Status: DC | PRN

## 2022-03-15 MED ORDER — KETOROLAC TROMETHAMINE 30 MG/ML IJ SOLN
30.0000 mg | Freq: Three times a day (TID) | INTRAMUSCULAR | Status: DC
  Administered 2022-03-15 – 2022-03-16 (×2): 30 mg via INTRAVENOUS
  Filled 2022-03-15 (×2): qty 1

## 2022-03-15 MED ORDER — ROPIVACAINE HCL 5 MG/ML IJ SOLN
INTRAMUSCULAR | Status: DC | PRN
  Administered 2022-03-15: 30 mL via PERINEURAL

## 2022-03-15 MED ORDER — LIDOCAINE HCL (CARDIAC) PF 100 MG/5ML IV SOSY
PREFILLED_SYRINGE | INTRAVENOUS | Status: DC | PRN
  Administered 2022-03-15: 100 mg via INTRAVENOUS

## 2022-03-15 MED ORDER — AMLODIPINE BESYLATE 5 MG PO TABS: 5.0000 mg | ORAL_TABLET | Freq: Every day | ORAL | Status: DC

## 2022-03-15 SURGICAL SUPPLY — 86 items
ALLOGRAFT PERF 16X20 1.6+/-0.4 (Tissue) ×1 IMPLANT
APPLIER CLIP 9.375 MED OPEN (MISCELLANEOUS)
BAG DECANTER FOR FLEXI CONT (MISCELLANEOUS) ×2 IMPLANT
BINDER BREAST LRG (GAUZE/BANDAGES/DRESSINGS) IMPLANT
BINDER BREAST MEDIUM (GAUZE/BANDAGES/DRESSINGS) IMPLANT
BINDER BREAST XLRG (GAUZE/BANDAGES/DRESSINGS) ×1 IMPLANT
BINDER BREAST XXLRG (GAUZE/BANDAGES/DRESSINGS) IMPLANT
BLADE CLIPPER SURG (BLADE) IMPLANT
BLADE SURG 10 STRL SS (BLADE) ×2 IMPLANT
BLADE SURG 15 STRL LF DISP TIS (BLADE) ×1 IMPLANT
BLADE SURG 15 STRL SS (BLADE) ×2
BNDG GAUZE DERMACEA FLUFF (GAUZE/BANDAGES/DRESSINGS)
BNDG GAUZE DERMACEA FLUFF 4 (GAUZE/BANDAGES/DRESSINGS) IMPLANT
CANISTER SUCT 1200ML W/VALVE (MISCELLANEOUS) ×2 IMPLANT
CHLORAPREP W/TINT 26 (MISCELLANEOUS) ×3 IMPLANT
CLIP APPLIE 9.375 MED OPEN (MISCELLANEOUS) IMPLANT
COVER BACK TABLE 60X90IN (DRAPES) ×2 IMPLANT
COVER MAYO STAND STRL (DRAPES) ×3 IMPLANT
COVER PROBE W GEL 5X96 (DRAPES) ×2 IMPLANT
DERMABOND ADVANCED (GAUZE/BANDAGES/DRESSINGS) ×1
DERMABOND ADVANCED .7 DNX12 (GAUZE/BANDAGES/DRESSINGS) ×1 IMPLANT
DRAIN CHANNEL 15F RND FF W/TCR (WOUND CARE) IMPLANT
DRAIN CHANNEL 19F RND (DRAIN) ×2 IMPLANT
DRAPE INCISE IOBAN 66X45 STRL (DRAPES) ×1 IMPLANT
DRAPE LAPAROSCOPIC ABDOMINAL (DRAPES) ×2 IMPLANT
DRAPE TOP ARMCOVERS (MISCELLANEOUS) ×2 IMPLANT
DRAPE U-SHAPE 76X120 STRL (DRAPES) ×3 IMPLANT
DRAPE UTILITY XL STRL (DRAPES) ×2 IMPLANT
DRSG PAD ABDOMINAL 8X10 ST (GAUZE/BANDAGES/DRESSINGS) ×4 IMPLANT
DRSG TEGADERM 2-3/8X2-3/4 SM (GAUZE/BANDAGES/DRESSINGS) IMPLANT
DRSG TEGADERM 4X10 (GAUZE/BANDAGES/DRESSINGS) ×2 IMPLANT
ELECT BLADE 4.0 EZ CLEAN MEGAD (MISCELLANEOUS) ×2
ELECT COATED BLADE 2.86 ST (ELECTRODE) ×2 IMPLANT
ELECT REM PT RETURN 9FT ADLT (ELECTROSURGICAL) ×2
ELECTRODE BLDE 4.0 EZ CLN MEGD (MISCELLANEOUS) ×1 IMPLANT
ELECTRODE REM PT RTRN 9FT ADLT (ELECTROSURGICAL) ×1 IMPLANT
EVACUATOR SILICONE 100CC (DRAIN) ×3 IMPLANT
EXPANDER TISSUE FV FOURTE 500 (Prosthesis & Implant Plastic) IMPLANT
GAUZE SPONGE 4X4 12PLY STRL LF (GAUZE/BANDAGES/DRESSINGS) IMPLANT
GLOVE BIO SURGEON STRL SZ 6 (GLOVE) ×2 IMPLANT
GLOVE SURG SIGNA 7.5 PF LTX (GLOVE) ×2 IMPLANT
GOWN STRL REUS W/ TWL LRG LVL3 (GOWN DISPOSABLE) ×2 IMPLANT
GOWN STRL REUS W/ TWL XL LVL3 (GOWN DISPOSABLE) ×1 IMPLANT
GOWN STRL REUS W/TWL LRG LVL3 (GOWN DISPOSABLE) ×4
GOWN STRL REUS W/TWL XL LVL3 (GOWN DISPOSABLE) ×2
HEMOSTAT SURGICEL 2X14 (HEMOSTASIS) IMPLANT
LIGHT WAVEGUIDE WIDE FLAT (MISCELLANEOUS) ×2 IMPLANT
MARKER SKIN DUAL TIP RULER LAB (MISCELLANEOUS) IMPLANT
NDL HYPO 25X1 1.5 SAFETY (NEEDLE) ×2 IMPLANT
NDL SAFETY ECLIPSE 18X1.5 (NEEDLE) ×1 IMPLANT
NEEDLE HYPO 18GX1.5 SHARP (NEEDLE) ×2
NEEDLE HYPO 25X1 1.5 SAFETY (NEEDLE) ×2 IMPLANT
NS IRRIG 1000ML POUR BTL (IV SOLUTION) ×2 IMPLANT
PACK BASIN DAY SURGERY FS (CUSTOM PROCEDURE TRAY) ×2 IMPLANT
PENCIL SMOKE EVACUATOR (MISCELLANEOUS) ×2 IMPLANT
PIN SAFETY STERILE (MISCELLANEOUS) ×2 IMPLANT
PUNCH BIOPSY DERMAL 4MM (MISCELLANEOUS) IMPLANT
SHEET MEDIUM DRAPE 40X70 STRL (DRAPES) ×3 IMPLANT
SLEEVE SCD COMPRESS KNEE MED (STOCKING) ×2 IMPLANT
SPIKE FLUID TRANSFER (MISCELLANEOUS) IMPLANT
SPONGE T-LAP 18X18 ~~LOC~~+RFID (SPONGE) ×4 IMPLANT
STAPLER VISISTAT 35W (STAPLE) ×2 IMPLANT
SUT CHROMIC 4 0 PS 2 18 (SUTURE) ×3 IMPLANT
SUT ETHILON 2 0 FS 18 (SUTURE) ×3 IMPLANT
SUT ETHILON 3 0 PS 1 (SUTURE) ×1 IMPLANT
SUT MNCRL AB 4-0 PS2 18 (SUTURE) ×3 IMPLANT
SUT PDS AB 2-0 CT2 27 (SUTURE) IMPLANT
SUT SILK 2 0 SH (SUTURE) IMPLANT
SUT VIC AB 3-0 SH 27 (SUTURE) ×2
SUT VIC AB 3-0 SH 27X BRD (SUTURE) ×1 IMPLANT
SUT VICRYL 0 CT-2 (SUTURE) ×2 IMPLANT
SUT VICRYL 4-0 PS2 18IN ABS (SUTURE) ×1 IMPLANT
SUT VLOC 180 0 24IN GS25 (SUTURE) ×1 IMPLANT
SYR 10ML LL (SYRINGE) ×2 IMPLANT
SYR 50ML LL SCALE MARK (SYRINGE) ×2 IMPLANT
SYR BULB EAR ULCER 3OZ GRN STR (SYRINGE) ×2 IMPLANT
SYR BULB IRRIG 60ML STRL (SYRINGE) ×2 IMPLANT
SYR CONTROL 10ML LL (SYRINGE) ×4 IMPLANT
TAPE MEASURE VINYL STERILE (MISCELLANEOUS) IMPLANT
TISSUE EXPNDR FV FOURTE 500 (Prosthesis & Implant Plastic) ×2 IMPLANT
TOWEL GREEN STERILE FF (TOWEL DISPOSABLE) ×2 IMPLANT
TRACER MAGTRACE VIAL (MISCELLANEOUS) IMPLANT
TRAY DSU PREP LF (CUSTOM PROCEDURE TRAY) IMPLANT
TUBE CONNECTING 20X1/4 (TUBING) ×2 IMPLANT
UNDERPAD 30X36 HEAVY ABSORB (UNDERPADS AND DIAPERS) ×4 IMPLANT
YANKAUER SUCT BULB TIP NO VENT (SUCTIONS) ×2 IMPLANT

## 2022-03-15 NOTE — Transfer of Care (Signed)
Immediate Anesthesia Transfer of Care Note  Patient: Madeline Chandler  Procedure(s) Performed: LEFT MASTECTOMY WITH INJECTION OF MAGTRACE (Left: Breast) LEFT BREAST RECONSTRUCTION WITH PLACEMENT OF TISSUE EXPANDER AND ALLODERM (Left: Breast)  Patient Location: PACU  Anesthesia Type:GA combined with regional for post-op pain  Level of Consciousness: awake  Airway & Oxygen Therapy: Patient Spontanous Breathing and Patient connected to face mask oxygen  Post-op Assessment: Report given to RN and Post -op Vital signs reviewed and stable  Post vital signs: Reviewed and stable  Last Vitals:  Vitals Value Taken Time  BP    Temp    Pulse 59 03/15/22 1401  Resp    SpO2 99 % 03/15/22 1401  Vitals shown include unvalidated device data.  Last Pain:  Vitals:   03/15/22 1034  TempSrc: Oral  PainSc: 1       Patients Stated Pain Goal: 1 (51/02/58 5277)  Complications: No notable events documented.

## 2022-03-15 NOTE — Interval H&P Note (Signed)
History and Physical Interval Note:  03/15/2022 11:15 AM  Madeline Chandler  has presented today for surgery, with the diagnosis of LEFT BREAST DUCTAL CARCINOMA IN SITU.  The various methods of treatment have been discussed with the patient and family. After consideration of risks, benefits and other options for treatment, the patient has consented to  Procedure(s) with comments: LEFT MASTECTOMY WITH INJECTION OF MAGTRACE (Left) - LMA LEFT BREAST RECONSTRUCTION WITH PLACEMENT OF TISSUE EXPANDER AND ALLODERM (Left) as a surgical intervention.  The patient's history has been reviewed, patient examined, no change in status, stable for surgery.  I have reviewed the patient's chart and labs.  Questions were answered to the patient's satisfaction.     Arnoldo Hooker Dayanne Yiu

## 2022-03-15 NOTE — Anesthesia Procedure Notes (Signed)
Procedure Name: Intubation Date/Time: 03/15/2022 12:14 PM  Performed by: Tawni Millers, CRNAPre-anesthesia Checklist: Patient identified, Emergency Drugs available, Suction available and Patient being monitored Patient Re-evaluated:Patient Re-evaluated prior to induction Oxygen Delivery Method: Circle system utilized Preoxygenation: Pre-oxygenation with 100% oxygen Induction Type: IV induction Ventilation: Mask ventilation without difficulty Laryngoscope Size: Mac and 3 Grade View: Grade I Tube type: Oral Tube size: 7.0 mm Number of attempts: 1 Airway Equipment and Method: Stylet and Oral airway Placement Confirmation: ETT inserted through vocal cords under direct vision, positive ETCO2 and breath sounds checked- equal and bilateral Tube secured with: Tape Dental Injury: Teeth and Oropharynx as per pre-operative assessment

## 2022-03-15 NOTE — Progress Notes (Signed)
Assisted Dr. Witman with left, pectoralis, ultrasound guided block. Side rails up, monitors on throughout procedure. See vital signs in flow sheet. Tolerated Procedure well. ?

## 2022-03-15 NOTE — Anesthesia Preprocedure Evaluation (Addendum)
Anesthesia Evaluation  Patient identified by MRN, date of birth, ID band Patient awake    Reviewed: Allergy & Precautions, NPO status , Patient's Chart, lab work & pertinent test results  History of Anesthesia Complications Negative for: history of anesthetic complications  Airway Mallampati: II  TM Distance: >3 FB Neck ROM: Full    Dental  (+) Teeth Intact, Dental Advisory Given   Pulmonary sleep apnea and Continuous Positive Airway Pressure Ventilation ,    Pulmonary exam normal        Cardiovascular hypertension, Pt. on medications and Pt. on home beta blockers Normal cardiovascular exam     Neuro/Psych Anxiety Depression negative neurological ROS     GI/Hepatic Neg liver ROS, GERD  ,  Endo/Other  diabetes, Type 2  Renal/GU negative Renal ROS  negative genitourinary   Musculoskeletal negative musculoskeletal ROS (+)   Abdominal   Peds  Hematology negative hematology ROS (+)   Anesthesia Other Findings Breast cancer  Reproductive/Obstetrics                           Anesthesia Physical Anesthesia Plan  ASA: 3  Anesthesia Plan: General   Post-op Pain Management: Tylenol PO (pre-op)* and Regional block*   Induction: Intravenous  PONV Risk Score and Plan: 3 and Ondansetron, Dexamethasone, Midazolam and Treatment may vary due to age or medical condition  Airway Management Planned: LMA  Additional Equipment: None  Intra-op Plan:   Post-operative Plan: Extubation in OR  Informed Consent: I have reviewed the patients History and Physical, chart, labs and discussed the procedure including the risks, benefits and alternatives for the proposed anesthesia with the patient or authorized representative who has indicated his/her understanding and acceptance.     Dental advisory given  Plan Discussed with:   Anesthesia Plan Comments:         Anesthesia Quick Evaluation

## 2022-03-15 NOTE — Op Note (Signed)
Operative Note   DATE OF OPERATION: 8.8.23  LOCATION: [facility]-observation  SURGICAL DIVISION: Plastic Surgery  PREOPERATIVE DIAGNOSES:  1. Left breast DCIS  POSTOPERATIVE DIAGNOSES:  same  PROCEDURE:  1. Left breast reconstruction with tissue expander 2. Acellular dermis (Alloderm) to left chest 300 cm2  SURGEON: Irene Limbo MD MBA  ASSISTANT: none  ANESTHESIA:  General.   EBL: 50 ml for entire procedure  COMPLICATIONS: None immediate.   INDICATIONS FOR PROCEDURE:  The patient, Madeline Chandler, is a 61 y.o. female born on [birth year], is here for immediate prepectoral tissue expander reconstruction following skin reduction pattern mastectomy.   FINDINGS: Natrelle 133S FV-13-T 500 ml tissue expander placed initial fill volume 250 ml air. SN [id number]  DESCRIPTION OF PROCEDURE:  The patient was marked to mark sternal notch, chest midline, anterior axillary lines and inframammary folds. Patient was marked for skin reduction mastectomy with most superior portion nipple areola marked on breast meridian. Vertical limbs marked by breast displacement and set at 9 cm length. The patient was taken to the operating room. SCDs were placed and IV antibiotics were given. The patient's operative site was prepped and draped in a sterile fashion. A time out was performed and all information was confirmed to be correct. Following completion of mastectomy, the lateral limbs for resection marked and area over lower pole preserved as inferiorly based dermal pedicle. Skin de epithelialized in this area.    The cavity was irrigated with saline solution containing Ancef, gentamicin, and Betadine. Hemostasis was ensured. A 19 Fr drain was placed in subcutaneous position laterally and a 15 Fr drain placed along inframammary fold. Each secured to skin with 2-0 nylon. The tissue expander was prepared on back table prior in insertion. The expander was filled with air. Perforated acellular dermis was   draped over anterior surface expander. The ADM was then secured to itself over posterior surface of expander with 4-0 chromic. Redundant folds acellular dermis excised so that the ADM lay flat without folds over air filled expander. The expander was secured to medial insertion pectoralis with a 0 vicryl. The superior and lateral tabs also secured to pectoralis muscle with 0-vicryl. The ADM was secured to pectoralis muscle and chest wall along inferior border at inframammary fold with 0 V lock. Laterally the mastectomy flap over posterior axillary line was advanced anteriorly and the subcutaneous tissue and superficial fascia was secured to chest wall with 0-vicryl. The inferiorly based dermal pedicle was redraped superiorly over expander and acellular dermis and secured to pectoralis with interrupted 0-vicryl. Skin closure completed with 3-0 vicryl in fascial layer and 4-0 vicryl in dermis. Skin closure completed with 4-0 monocryl subcuticular and tissue adhesive. Tegaderms applied, followed by dry dressing and breast binder.   The patient was allowed to wake from anesthesia, extubated and taken to the recovery room in satisfactory condition.   SPECIMENS: none  DRAINS: 15 and 19 Fr JP in left breast reconstruction  Irene Limbo, MD [facility]  Office/ physician access line after hours [phone]

## 2022-03-15 NOTE — Anesthesia Postprocedure Evaluation (Signed)
Anesthesia Post Note  Patient: Madeline Chandler  Procedure(s) Performed: LEFT MASTECTOMY WITH INJECTION OF MAGTRACE (Left: Breast) LEFT BREAST RECONSTRUCTION WITH PLACEMENT OF TISSUE EXPANDER AND ALLODERM (Left: Breast)     Patient location during evaluation: PACU Anesthesia Type: General Level of consciousness: awake and alert Pain management: pain level controlled Vital Signs Assessment: post-procedure vital signs reviewed and stable Respiratory status: spontaneous breathing, nonlabored ventilation and respiratory function stable Cardiovascular status: blood pressure returned to baseline and stable Postop Assessment: no apparent nausea or vomiting Anesthetic complications: no   No notable events documented.  Last Vitals:  Vitals:   03/15/22 1515 03/15/22 1530  BP: 112/64 117/67  Pulse: 60 63  Resp: 12 (!) 21  Temp:    SpO2: 100% 99%    Last Pain:  Vitals:   03/15/22 1504  TempSrc:   PainSc: 5                  Lidia Collum

## 2022-03-15 NOTE — Op Note (Addendum)
   Madeline Chandler 03/15/2022   Pre-op Diagnosis: LEFT BREAST DUCTAL CARCINOMA IN SITU     Post-op Diagnosis: same  Procedure(s): LEFT TOTAL MASTECTOMY  INJECTION OF MAGTRACE FOR LYMPH NODE MAPPING  Surgeon: Coralie Keens, MD  Anesthesia: General  Staff:  Circulator: Dolores Lory, RN; Merwyn Katos, RN Scrub Person: Patric Dykes, RN  Estimated Blood Loss: Minimal               Specimens: sent to path  Indications: This is a 61 year old female with a prior history of right breast cancer who had undergone a right mastectomy with bilateral reduction surgery who now has an extensive area of calcifications in her left breast with 2 areas on biopsy showing DCIS.  The decision was made to proceed with a left total mastectomy with immediate reconstruction.  Mag trace will be injected for lymph node mapping in case the final pathology shows invasive cancer  Procedure: The patient was brought to the operating room identifies a correct patient.  She was placed upon the operating table and general anesthesia was induced.  I next injected mag trace underneath the left nipple areolar complex with a 21-gauge needle and gently massaged the breast.  Her bilateral chest, axilla, and neck were then prepped and draped in usual sterile fashion.  Dr. Iran Planas then marked the left breast for the incisions for the mastectomy.  I made an incision through these marks with a scalpel.  This was a triangular shaped incision.  I dissected the medial side for staying just under the skin and dermis dissecting the flap toward the middle of the chest and the down to the chest wall.  I then dissected the inferior skin flap down to the inframammary ridge.  I next dissected the lateral skin flap toward the axilla with electrocautery.  Hemostasis was achieved in the flaps with the cautery.  I then dissected down to the pectoralis muscle circumferentially.  I next dissected the breast tissue off of the chest wall  dissecting lateral to medial with the electrocautery.  1 single perforator was controlled with the electrocautery.  Once the total mastectomy was complete I marked the breast with a suture placed laterally for identification.  The breast was then sent to pathology for evaluation.  Again, hemostasis appeared to be achieved.  The flaps appeared viable.  At this point Dr. Iran Planas continue the procedure with the immediate reconstruction.          Coralie Keens   Date: 03/15/2022  Time: 12:42 PM

## 2022-03-15 NOTE — Anesthesia Procedure Notes (Signed)
Anesthesia Regional Block: Pectoralis block   Pre-Anesthetic Checklist: , timeout performed,  Correct Patient, Correct Site, Correct Laterality,  Correct Procedure, Correct Position, site marked,  Risks and benefits discussed,  Surgical consent,  Pre-op evaluation,  At surgeon's request and post-op pain management  Laterality: Left  Prep: chloraprep       Needles:  Injection technique: Single-shot  Needle Type: Echogenic Stimulator Needle     Needle Length: 10cm  Needle Gauge: 20     Additional Needles:   Procedures:,,,, ultrasound used (permanent image in chart),,    Narrative:  Start time: 03/15/2022 11:32 AM End time: 03/15/2022 11:36 AM Injection made incrementally with aspirations every 5 mL.  Performed by: Personally  Anesthesiologist: Lidia Collum, MD

## 2022-03-15 NOTE — Interval H&P Note (Signed)
History and Physical Interval Note: no change in H and P  03/15/2022 11:20 AM  Madeline Chandler  has presented today for surgery, with the diagnosis of LEFT BREAST DUCTAL CARCINOMA IN SITU.  The various methods of treatment have been discussed with the patient and family. After consideration of risks, benefits and other options for treatment, the patient has consented to  Procedure(s) with comments: LEFT MASTECTOMY WITH INJECTION OF MAGTRACE (Left) - LMA LEFT BREAST RECONSTRUCTION WITH PLACEMENT OF TISSUE EXPANDER AND ALLODERM (Left) as a surgical intervention.  The patient's history has been reviewed, patient examined, no change in status, stable for surgery.  I have reviewed the patient's chart and labs.  Questions were answered to the patient's satisfaction.     Coralie Keens

## 2022-03-16 ENCOUNTER — Encounter (HOSPITAL_BASED_OUTPATIENT_CLINIC_OR_DEPARTMENT_OTHER): Payer: Self-pay | Admitting: Surgery

## 2022-03-16 DIAGNOSIS — D0512 Intraductal carcinoma in situ of left breast: Secondary | ICD-10-CM | POA: Diagnosis not present

## 2022-03-16 NOTE — Discharge Summary (Signed)
Physician Discharge Summary  Patient ID: Madeline Chandler MRN: [id number] DOB/AGE: 1961-01-11 61 y.o.  Admit date: 03/15/2022 Discharge date: 03/16/2022  Admission Diagnoses:  Discharge Diagnoses:  Principal Problem:   Ductal carcinoma in situ (DCIS) of left breast   Discharged Condition: good  Hospital Course: uneventful post op recovery.  Discharged home POD#1 with drains  Consults: None  Significant Diagnostic Studies:   Treatments: surgery: left total mastectomy and immediate reconstruction  Discharge Exam: Blood pressure 118/71, pulse 81, temperature 98.1 F (36.7 C), resp. rate 16, height 5' 5.5" (1.664 m), weight 84 kg, SpO2 100 %. General appearance: alert, cooperative, and no distress Resp: clear to auscultation bilaterally Cardio: regular rate and rhythm, S1, S2 normal, no murmur, click, rub or gallop Incision/Wound:drains serosang, no hematoma, left skin flaps viable  Disposition: Discharge disposition: 01-Home or Self Care       Discharge Instructions     Call MD for:  redness, tenderness, or signs of infection (pain, swelling, bleeding, redness, odor or green/yellow discharge around incision site)   Complete by: As directed    Call MD for:  severe or increased pain, loss or decreased feeling  in affected limb(s)   Complete by: As directed    Call MD for:  temperature >100.5   Complete by: As directed    Discharge instructions   Complete by: As directed    Ok to remove dressings and shower am 8.10.23. Soap and water ok, pat Tegaderms dry. Do not remove Tegaderms. No creams or ointments over incisions. Do not let drains dangle in shower, attach to lanyard or similar.Strip and record drains twice daily and bring log to clinic visit.  Breast binder or soft compression bra all other times.  Ok to raise arms above shoulders for bathing and dressing.  No house yard work or exercise until cleared by MD.   Also ok to use Tylenol for pain. Patient received all  other Rx preop.   Driving Restrictions   Complete by: As directed    No driving for 2 weeks then no driving if taking prescription pain medication   Lifting restrictions   Complete by: As directed    No lifting > 5- 10 lbs until cleared by MD   Resume previous diet   Complete by: As directed       Allergies as of 03/16/2022       Reactions   Prednisone Other (See Comments)   headaches        Medication List     STOP taking these medications    oxycodone 5 MG capsule Commonly known as: OXY-IR Replaced by: oxyCODONE 5 MG immediate release tablet       TAKE these medications    amLODipine 5 MG tablet Commonly known as: NORVASC TAKE 1 TABLET BY MOUTH DAILY   amphetamine-dextroamphetamine 20 MG tablet Commonly known as: ADDERALL Take 20 mg by mouth daily.   atorvastatin 40 MG tablet Commonly known as: LIPITOR Take 1 tablet (40 mg total) by mouth daily.   azelastine 0.1 % nasal spray Commonly known as: ASTELIN Place 2 sprays into both nostrils 2 (two) times daily. Use in each nostril as directed   carvedilol 25 MG tablet Commonly known as: COREG TAKE ONE TABLET BY MOUTH TWICE A DAY WITH MEALS   DULoxetine 30 MG capsule Commonly known as: CYMBALTA TAKE ONE CAPSULE BY MOUTH DAILY   letrozole 2.5 MG tablet Commonly known as: FEMARA TAKE 1 TABLET BY MOUTH DAILY   levocetirizine 5  MG tablet Commonly known as: XYZAL Take 1 tablet (5 mg total) by mouth every evening.   losartan-hydrochlorothiazide 100-25 MG tablet Commonly known as: HYZAAR TAKE ONE TABLET BY MOUTH DAILY   meloxicam 15 MG tablet Commonly known as: MOBIC Take 1 tablet (15 mg total) by mouth daily as needed for pain.   omeprazole 40 MG capsule Commonly known as: PRILOSEC TAKE ONE CAPSULE BY MOUTH DAILY   OneTouch Delica Lancets Fine Misc Use as directed once daily to check blood sugar.  DXE11.9   OneTouch Verio test strip Generic drug: glucose blood Use once daily to check blood  sugar.  DX E11.9   oxyCODONE 5 MG immediate release tablet Commonly known as: Roxicodone Take 1 tablet (5 mg total) by mouth every 4 (four) hours as needed. Replaces: oxycodone 5 MG capsule   Semaglutide (2 MG/DOSE) 8 MG/3ML Sopn Inject 2 mg as directed once a week.   traZODone 50 MG tablet Commonly known as: DESYREL TAKE 1/2 TO 1 TABLET AT BEDTIME AS NEEDED FOR SLEEP        Follow-up Information     Irene Limbo, MD Follow up in 1 week(s).   Specialty: Plastic Surgery Why: as scheduled Contact information: [address] [place] [address] [phone]         Coralie Keens, MD. Schedule an appointment as soon as possible for a visit in 1 month(s).   Specialty: General Surgery Contact information: [address] [place] [address] [phone]                 Signed: Coralie Keens 03/16/2022, 7:49 AM

## 2022-03-16 NOTE — Progress Notes (Signed)
Patient ID: Madeline Chandler, female   DOB: 1961/02/09, 61 y.o.   MRN: [id number]   Doing well this morning Pain controlled Drains serosang Flaps viable  Plan: Discharge home

## 2022-03-16 NOTE — Discharge Instructions (Signed)

## 2022-03-18 LAB — SURGICAL PATHOLOGY

## 2022-03-25 ENCOUNTER — Other Ambulatory Visit: Payer: Self-pay | Admitting: Family Medicine

## 2022-03-29 ENCOUNTER — Inpatient Hospital Stay: Payer: 59 | Attending: Oncology | Admitting: Oncology

## 2022-03-29 VITALS — BP 107/68 | HR 80 | Temp 98.1°F | Resp 18 | Ht 65.0 in | Wt 183.4 lb

## 2022-03-29 DIAGNOSIS — Z9882 Breast implant status: Secondary | ICD-10-CM | POA: Diagnosis not present

## 2022-03-29 DIAGNOSIS — Z9013 Acquired absence of bilateral breasts and nipples: Secondary | ICD-10-CM | POA: Diagnosis not present

## 2022-03-29 DIAGNOSIS — D0512 Intraductal carcinoma in situ of left breast: Secondary | ICD-10-CM | POA: Insufficient documentation

## 2022-03-29 DIAGNOSIS — Z90710 Acquired absence of both cervix and uterus: Secondary | ICD-10-CM | POA: Insufficient documentation

## 2022-03-29 NOTE — Progress Notes (Addendum)
Madeline Chandler has a remote history of bilateral breast cancer, currently maintained on letrozole after being diagnosed with right breast DCIS in 2018.  She began adjuvant letrozole 11/11/2016.  Left breast calcifications were noted on a screening mammogram in 2023.  A diagnostic left mammogram 01/07/2022 revealed diffuse suspicious linear calcifications throughout the left breast.  The calcifications are suspicious for DCIS. She underwent stereotactic needle core biopsies of the outer extent of left breast calcifications and inner extent of calcifications on 01/14/2022.  The pathology revealed DCIS, solid/apron type with comedonecrosis grade 2/3 with foci suspicious for microinvasion involving the outer and inner biopsies.  The estrogen receptor returned at 95%, progesterone receptor 0%, Ki67 10%.  She was referred to Dr. Ninfa Linden and was taken to a left mastectomy and immediate reconstruction on 03/15/2022.  A left breast reconstruction with tissue expander and acellular dermis to the left chest was performed.  The pathology from the left mastectomy revealed high-grade DCIS, negative for invasive carcinoma.  The resection margins were negative.  DCIS was present in the lateral and medial quadrants.  Madeline Chandler has recovered from surgery.  She continues follow-up with Dr. Leland Johns to complete left breast reconstruction.  She continues letrozole.  Objective:  Vital signs in last 24 hours:  Blood pressure 107/68, pulse 80, temperature 98.1 F (36.7 C), temperature source Oral, resp. rate 18, height '5\' 5"'  (1.651 m), weight 183 lb 6.4 oz (83.2 kg), SpO2 100 %.    HEENT: Neck without mass Lymphatics: No cervical, supraclavicular, or axillary nodes Resp: Lungs clear bilaterally Cardio: Regular rate and rhythm GI: No hepatosplenomegaly Vascular: No leg edema Breast: Right mastectomy with a TRAM  reconstruction.  No evidence for chest wall tumor recurrence.  Status post left mastectomy with a reconstruction.  Healed surgical incisions.  Both axillae appear benign.   Lab Results:  Lab Results  Component Value Date   WBC 4.6 02/11/2022   HGB 13.1 02/11/2022   HCT 39.7 02/11/2022   MCV 87.9 02/11/2022   PLT 226.0 02/11/2022   NEUTROABS 6.4 03/09/2019    CMP  Lab Results  Component Value Date   NA 138 03/14/2022   K 3.6 03/14/2022   CL 96 (L) 03/14/2022   CO2 34 (H) 03/14/2022   GLUCOSE 111 (H) 03/14/2022   BUN 15 03/14/2022   CREATININE 1.17 (H) 03/14/2022   CALCIUM 10.1 03/14/2022   PROT 7.6 02/11/2022   ALBUMIN 4.3 02/11/2022   AST 19 02/11/2022   ALT 19 02/11/2022   ALKPHOS 89 02/11/2022   BILITOT 0.8 02/11/2022   GFRNONAA 53 (L) 03/14/2022   GFRAA >60 10/28/2019    No results found for: "CEA1", "CEA", "OJJ009", "CA125"  Lab Results  Component Value Date   INR 1.0 11/04/2015   LABPROT 10.5 11/04/2015    Imaging:  No results found.  Medications: I have reviewed the patient's current medications.   Assessment/Plan: 1.Stage II (T2 N0) sees right-sided breast cancer diagnosed in September 1997, status post a lumpectomy, sentinel lymph node biopsy, and right axillary dissection and radiation followed by adjuvant chemotherapy. She completed 5 years of tamoxifen.   2.  DCIS with a microscopic area of invasive carcinoma noted on biopsy of an area of distortion in the upper inner right breast January 2018 DCIS is ER positive, PR positive, and HER-2 positive (not enough invasive tumor for a breast prognostic profile) Right mastectomy 10/28/2016, 1.4 cm  intermediate grade DCIS, negative resection margins, no residual invasive carcinoma identified, no lymph nodes identified in submitted axillary contents Adjuvant Femara 11/11/2016, discontinued 03/29/2022 3. Hysterectomy and bilateral oophorectomy April 2008   4. Right breast implant and left breast reduction  03/07/2017  5.  Negative genetic testing 2018-[employer] Common Cancer Panel-normal  6.  Left breast DCIS Diagnostic left mammogram 01/07/2022-diffuse suspicious linear branching calcifications throughout the left breast suspicious for DCIS Stereotactic core biopsy of outer extent of left breast calcifications and inner extent of left breast calcifications 01/14/2022-DCIS, solid/a progression with comedonecrosis and calcifications, grade 2/3, foci suspicious for microinvasion involving the inner and outer aspect biopsies, ER 95%, PR 0%, 6710% Left mastectomy and immediate reconstruction 03/15/2022-DCIS involving the lateral and medial quadrants and within a lactiferous duct beneath the nipple, high-grade, necrosis present, negative margins, negative for invasive carcinoma       Disposition: Madeline Chandler has a history of bilateral breast cancer.  She was diagnosed with invasive right-sided breast cancer in 1997, right DCIS with a microscopic area of invasive carcinoma in January 2018, and diffuse left breast DCIS in August 2023.  She has been maintained on adjuvant letrozole since being diagnosed with right DCIS/microinvasive carcinoma in January 2018.  I recommended she discontinue letrozole.  She has completed greater than 5 years of letrozole therapy and the current DCIS occurred while on letrozole.  She has a good prognosis for a long-term disease-free survival.  She has undergone negative genetic testing in the past.  She will return for an office visit in 6 months.  Betsy Coder, MD  03/29/2022  5:14 PM

## 2022-04-06 ENCOUNTER — Encounter: Payer: Self-pay | Admitting: Family Medicine

## 2022-04-06 ENCOUNTER — Ambulatory Visit (INDEPENDENT_AMBULATORY_CARE_PROVIDER_SITE_OTHER): Payer: 59 | Admitting: Family Medicine

## 2022-04-06 VITALS — BP 136/78 | HR 78 | Temp 97.0°F | Ht 65.5 in | Wt 185.0 lb

## 2022-04-06 DIAGNOSIS — F411 Generalized anxiety disorder: Secondary | ICD-10-CM | POA: Diagnosis not present

## 2022-04-06 MED ORDER — VENLAFAXINE HCL ER 75 MG PO CP24: 75.0000 mg | ORAL_CAPSULE | Freq: Every day | ORAL | 1 refills | Status: DC

## 2022-04-06 MED ORDER — HYDROXYZINE HCL 25 MG PO TABS: 12.5000 mg | ORAL_TABLET | Freq: Three times a day (TID) | ORAL | 0 refills | Status: DC | PRN

## 2022-04-06 NOTE — Progress Notes (Signed)
Chief Complaint  Patient presents with   Anxiety    Subjective Madeline Chandler presents for f/u anxiety.  Pt is currently being treated with Cymbalta 30 mg/d.  Reports worsening since surgery 3 weeks ago. No obvious trigger.  No thoughts of harming self or others. No self-medication with alcohol, prescription drugs or illicit drugs. No obvious trigger.  Pt is following with a counselor/psychologist.  Past Medical History:  Diagnosis Date   Ankle fracture 03/09/2019   Left   Arthritis    "hips" (10/28/2016)   Avascular necrosis of bones of both hips    Breast cancer ([place]) 02/2022   left breast DCIS   Bulging lumbar disc    2   Cervical pain (neck)    De Quervain's tenosynovitis, bilateral    Ductal carcinoma in situ (DCIS) of right breast 1997; 2018   s/p chemotherapy and radiation treatment   Essential hypertension    08/22/11: BUN 15, creatinine 0.95, GFR 79      Generalized anxiety disorder    GERD (gastroesophageal reflux disease)    History of colonic polyps    History of migraine headaches    "none in the last few years; had them mostly in late 20's/mid 30's"   History of therapeutic radiation    Hypokalemia    Insomnia    Major depressive disorder    Mild neurocognitive disorder, unclear etiology 04/24/2020   OSA (obstructive sleep apnea) 02/19/2018   Awaiting CPAP machine as of 04/24/2020   Restless leg syndrome    Type 2 diabetes mellitus    Upper respiratory infection 07/16/2010   Allergies as of 04/06/2022       Reactions   Prednisone Other (See Comments)   headaches        Medication List        Accurate as of April 06, 2022  1:35 PM. If you have any questions, ask your nurse or doctor.          STOP taking these medications    DULoxetine 30 MG capsule Commonly known as: CYMBALTA Stopped by: Shelda Pal, DO       TAKE these medications    amLODipine 5 MG tablet Commonly known as: NORVASC TAKE 1 TABLET BY MOUTH DAILY    amphetamine-dextroamphetamine 20 MG tablet Commonly known as: ADDERALL Take 20 mg by mouth daily.   atorvastatin 40 MG tablet Commonly known as: LIPITOR Take 1 tablet (40 mg total) by mouth daily.   azelastine 0.1 % nasal spray Commonly known as: ASTELIN Place 2 sprays into both nostrils 2 (two) times daily. Use in each nostril as directed   carvedilol 25 MG tablet Commonly known as: COREG TAKE ONE TABLET BY MOUTH TWICE A DAY WITH MEALS   hydrOXYzine 25 MG tablet Commonly known as: ATARAX Take 0.5-2 tablets (12.5-50 mg total) by mouth 3 (three) times daily as needed for anxiety. Started by: Shelda Pal, DO   letrozole 2.5 MG tablet Commonly known as: FEMARA TAKE 1 TABLET BY MOUTH DAILY   levocetirizine 5 MG tablet Commonly known as: XYZAL Take 1 tablet (5 mg total) by mouth every evening.   losartan-hydrochlorothiazide 100-25 MG tablet Commonly known as: HYZAAR TAKE ONE TABLET BY MOUTH DAILY   meloxicam 15 MG tablet Commonly known as: MOBIC Take 1 tablet (15 mg total) by mouth daily as needed for pain.   omeprazole 40 MG capsule Commonly known as: PRILOSEC TAKE 1 CAPSULE BY MOUTH DAILY   OneTouch Delica Lancets Fine  Misc Use as directed once daily to check blood sugar.  DXE11.9   OneTouch Verio test strip Generic drug: glucose blood Use once daily to check blood sugar.  DX E11.9   oxyCODONE 5 MG immediate release tablet Commonly known as: Roxicodone Take 1 tablet (5 mg total) by mouth every 4 (four) hours as needed.   Semaglutide (2 MG/DOSE) 8 MG/3ML Sopn Inject 2 mg as directed once a week.   traZODone 50 MG tablet Commonly known as: DESYREL TAKE 1/2 TO 1 TABLET AT BEDTIME AS NEEDED FOR SLEEP   venlafaxine XR 75 MG 24 hr capsule Commonly known as: Effexor XR Take 1 capsule (75 mg total) by mouth daily with breakfast. Started by: Shelda Pal, DO        Exam BP 136/78 (BP Location: Right Arm, Cuff Size: Large)   Pulse 78    Temp (!) 97 F (36.1 C) (Oral)   Ht 5' 5.5" (1.664 m)   Wt 185 lb (83.9 kg)   SpO2 97%   BMI 30.32 kg/m  General:  well developed, well nourished, in no apparent distress Lungs:  No respiratory distress Psych: well oriented with normal range of affect and age-appropriate judgement/insight, alert and oriented x4.  Assessment and Plan  Anxiety state - Plan: venlafaxine XR (EFFEXOR XR) 75 MG 24 hr capsule, hydrOXYzine (ATARAX) 25 MG tablet  Chronic, uncontrolled. Change Cymbalta to Effexor XR 75 mg/d. Hydroxyzine prn. Counseling info provided. Counseled on exercise. F/u in 1 mo. The patient voiced understanding and agreement to the plan.  [place], DO 04/06/22 1:35 PM

## 2022-04-06 NOTE — Patient Instructions (Addendum)
Coping skills Choose 5 that work for you: Take a deep breath Count to 20 Read a book Do a puzzle Meditate Bake Sing Knit Garden Pray Go outside Call a friend Listen to music Take a walk Color Send a note Take a bath Watch a movie Be alone in a quiet place Pet an animal Visit a friend Journal Exercise Stretch   Please consider counseling. Contact [phone] to schedule an appointment or inquire about cost/insurance coverage.  [facility] located at [place], [place], [place].  Phone number = [phone].  Dr. Lennice Sites - Adult Psychiatry.    [facility] located at [place], [place], [place]. Phone number = [phone].   [facility] located at [address] [place], [place].  Phone number = [phone].   [facility] located at [place], [place], [place].  Phone number = [phone].  Aim to do some physical exertion for 150 minutes per week. This is typically divided into 5 days per week, 30 minutes per day. The activity should be enough to get your heart rate up. Anything is better than nothing if you have time constraints.  Let us know if you need anything.

## 2022-04-07 ENCOUNTER — Other Ambulatory Visit: Payer: Self-pay | Admitting: Family Medicine

## 2022-04-07 DIAGNOSIS — E669 Obesity, unspecified: Secondary | ICD-10-CM

## 2022-04-09 ENCOUNTER — Other Ambulatory Visit: Payer: Self-pay | Admitting: Family Medicine

## 2022-04-09 ENCOUNTER — Other Ambulatory Visit: Payer: Self-pay | Admitting: Medical

## 2022-04-09 DIAGNOSIS — C50211 Malignant neoplasm of upper-inner quadrant of right female breast: Secondary | ICD-10-CM

## 2022-04-13 ENCOUNTER — Other Ambulatory Visit: Payer: Self-pay

## 2022-04-13 DIAGNOSIS — Z170 Estrogen receptor positive status [ER+]: Secondary | ICD-10-CM

## 2022-04-13 MED ORDER — LETROZOLE 2.5 MG PO TABS: 2.5000 mg | ORAL_TABLET | Freq: Every day | ORAL | 0 refills | Status: DC

## 2022-04-15 ENCOUNTER — Telehealth: Payer: Self-pay | Admitting: Family Medicine

## 2022-04-15 DIAGNOSIS — F411 Generalized anxiety disorder: Secondary | ICD-10-CM

## 2022-04-15 MED ORDER — HYDROXYZINE HCL 25 MG PO TABS: 12.5000 mg | ORAL_TABLET | Freq: Three times a day (TID) | ORAL | 2 refills | Status: DC | PRN

## 2022-04-15 NOTE — Telephone Encounter (Signed)
Patient stated she has been taken pills 2x/day and needs a refill of her hydrOXYzine (ATARAX) 25 MG tablet sent to:   [facility] [id number] -   [address] [place] [place] [address]  Phone:  [phone]  Fax:  [phone]

## 2022-04-15 NOTE — Telephone Encounter (Signed)
Does Quantity need to be increased?

## 2022-04-23 ENCOUNTER — Other Ambulatory Visit: Payer: Self-pay | Admitting: Family Medicine

## 2022-05-18 NOTE — H&P (Signed)
Subjective:     Patient ID: Madeline Chandler is a 61 y.o. female.   Follow-up   9 weeks post op. Scheduled for implant exchange end of month.   Screening MMG 2023 with left breast calcifications. Diagnostic MMG/US showed normal axilla, diffuse calcifications left breast span 12 cm medial to lateral and 9.1 cm AP. Calcifications extend to the subareolar region. Biopsies labled left breast inner and left breast outer both demonstrated grade 2/3 DCIS with comedonecrosis, calcifications and foci suspicious for microinvasion, ER+/PR-. Underwent mastectomy with final pathology high grade DCIS margins clear.     PMH significant for history right breast ca diagnosed 1997, treated with lumpectomy and adjuvant chemotherapy and radiation, 5 years tamoxifen. Routine follow up imaging 08/2016 with right breast distortion. MMG/US with area of concern right breast 1 o clock. 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 Final pathology with intermediate grade DCIS, margins clear. Prior ALND and no lymph tissue noted in specimen labeled right axillary contents.   Genetics negative. Femara discontinued 03/2022.    Prior to lumpectomy B cup, prior to reduction D cup on left. Right mastectomy 547 g left breast reduction 640 g. Left mastectomy 534 g   HbA1c 07/2021 6.4    Lives alone. New puppy. Continues to work for [employer] from home.   Review of Systems Remainder 12 point review negative    Objective:   Physical Exam PULM: clear to auscultation bilateral CV: normal heart sounds, normal rate   Chest: right reconstruction soft, pseudoptosis, Left chest expanded scars maturing     Assessment:     DCIS right breast, microscopic focus IDC UIQ ER/PR+, Her2+ History right breast ca, s/p lumpectomy, XRT S/p right mastectomy, LD/TE reconstruction S/p debridement right mastectomy flap S/p removal TE, placement saline implant, left breast reduction S/p left NAC  reconstruction with local flap, FTSG S/p tattoo areola skin graft Left breast DCIS S/p left SRM, prepectoral TE/ADM (Alloderm) reconstruction    Plan:     Plan removal left chest tissue expander and placement implant. Hold ozempic week prior to surgery.   Reviewed saline vs silicone, smooth v textured, shaped v round.  As in prepectoral position in general I recommend HCG or capacity filled silicone implants to reduce risk visible rippling.  Reviewed MRI or [place] surveillance for rupture with silicone implants. Reviewed examples for 4th generation, capacity filled 4th generation, and HCG implants vs saline implants. Reviewed risks AP flipping that may be more noticeable with 5th generation implants, may require surgery to correct. Discussed risk ALCL with textured devices. Patient has saline implant on right and has elected for saline on left. Plan smooth round.    Discussed purpose fat grafting to thicken flaps, reduce visible rippling. Reviewed variable take graft, may need to repeat, fat necrosis that presents as masses, abdominal incisions, pain need for compression. Declines this.   Additional risks including but not limited to bleeding infection anesthesia damage to adjacent structures need for additional procedures asymmetry unacceptable cosmetic result blood clots in legs or lungs reviewed.   Completed Natrelle Saline physician patient checklist.   Rx for oxycodone and Bactrim given.     RIGHT: Natrelle Smooth Round High Profile Saline implant, 750 ml filled to 800 ml. REF [id number].   LEFT: Natrelle 133S FV-13-T 500 ml tissue expander placed  fill volume 450 ml saline

## 2022-05-25 ENCOUNTER — Encounter (HOSPITAL_BASED_OUTPATIENT_CLINIC_OR_DEPARTMENT_OTHER): Payer: Self-pay | Admitting: Plastic Surgery

## 2022-05-25 ENCOUNTER — Other Ambulatory Visit: Payer: Self-pay | Admitting: Family Medicine

## 2022-05-25 ENCOUNTER — Other Ambulatory Visit: Payer: Self-pay

## 2022-05-25 DIAGNOSIS — I10 Essential (primary) hypertension: Secondary | ICD-10-CM

## 2022-05-31 ENCOUNTER — Encounter (HOSPITAL_BASED_OUTPATIENT_CLINIC_OR_DEPARTMENT_OTHER)
Admission: RE | Admit: 2022-05-31 | Discharge: 2022-05-31 | Disposition: A | Discharge status: Home / Self Care | Payer: 59 | Source: Ambulatory Visit | Attending: Plastic Surgery | Admitting: Plastic Surgery

## 2022-05-31 DIAGNOSIS — E669 Obesity, unspecified: Secondary | ICD-10-CM | POA: Insufficient documentation

## 2022-05-31 DIAGNOSIS — Z01812 Encounter for preprocedural laboratory examination: Secondary | ICD-10-CM | POA: Insufficient documentation

## 2022-05-31 DIAGNOSIS — E1169 Type 2 diabetes mellitus with other specified complication: Secondary | ICD-10-CM | POA: Insufficient documentation

## 2022-05-31 LAB — BASIC METABOLIC PANEL
Anion gap: 4 — ABNORMAL LOW (ref 5–15)
BUN: 6 mg/dL — ABNORMAL LOW (ref 8–23)
CO2: 27 mmol/L (ref 22–32)
Calcium: 9.7 mg/dL (ref 8.9–10.3)
Chloride: 104 mmol/L (ref 98–111)
Creatinine, Ser: 0.97 mg/dL (ref 0.44–1.00)
GFR, Estimated: >60 mL/min (ref >60)
Glucose, Bld: 97 mg/dL (ref 70–99)
Potassium: 3.9 mmol/L (ref 3.5–5.1)
Sodium: 135 mmol/L (ref 135–145)

## 2022-05-31 NOTE — Progress Notes (Signed)

## 2022-06-03 ENCOUNTER — Ambulatory Visit (HOSPITAL_BASED_OUTPATIENT_CLINIC_OR_DEPARTMENT_OTHER): Payer: 59 | Admitting: Certified Registered"

## 2022-06-03 ENCOUNTER — Other Ambulatory Visit: Payer: Self-pay

## 2022-06-03 ENCOUNTER — Encounter (HOSPITAL_BASED_OUTPATIENT_CLINIC_OR_DEPARTMENT_OTHER)
Admission: RE | Disposition: A | Discharge status: Home / Self Care | Payer: Self-pay | Source: Home / Self Care | Attending: Plastic Surgery

## 2022-06-03 ENCOUNTER — Encounter (HOSPITAL_BASED_OUTPATIENT_CLINIC_OR_DEPARTMENT_OTHER): Payer: Self-pay | Admitting: Plastic Surgery

## 2022-06-03 ENCOUNTER — Ambulatory Visit (HOSPITAL_BASED_OUTPATIENT_CLINIC_OR_DEPARTMENT_OTHER)
Admission: RE | Admit: 2022-06-03 | Discharge: 2022-06-03 | Disposition: A | Discharge status: Home / Self Care | Payer: 59 | Attending: Plastic Surgery | Admitting: Plastic Surgery

## 2022-06-03 DIAGNOSIS — Z7984 Long term (current) use of oral hypoglycemic drugs: Secondary | ICD-10-CM | POA: Insufficient documentation

## 2022-06-03 DIAGNOSIS — Z853 Personal history of malignant neoplasm of breast: Secondary | ICD-10-CM | POA: Insufficient documentation

## 2022-06-03 DIAGNOSIS — E119 Type 2 diabetes mellitus without complications: Secondary | ICD-10-CM | POA: Diagnosis not present

## 2022-06-03 DIAGNOSIS — Z9221 Personal history of antineoplastic chemotherapy: Secondary | ICD-10-CM | POA: Diagnosis not present

## 2022-06-03 DIAGNOSIS — Z9013 Acquired absence of bilateral breasts and nipples: Secondary | ICD-10-CM

## 2022-06-03 DIAGNOSIS — I10 Essential (primary) hypertension: Secondary | ICD-10-CM | POA: Diagnosis not present

## 2022-06-03 DIAGNOSIS — Z170 Estrogen receptor positive status [ER+]: Secondary | ICD-10-CM | POA: Diagnosis not present

## 2022-06-03 DIAGNOSIS — D0512 Intraductal carcinoma in situ of left breast: Secondary | ICD-10-CM | POA: Insufficient documentation

## 2022-06-03 DIAGNOSIS — K219 Gastro-esophageal reflux disease without esophagitis: Secondary | ICD-10-CM | POA: Insufficient documentation

## 2022-06-03 DIAGNOSIS — F418 Other specified anxiety disorders: Secondary | ICD-10-CM | POA: Diagnosis not present

## 2022-06-03 DIAGNOSIS — Z923 Personal history of irradiation: Secondary | ICD-10-CM | POA: Diagnosis not present

## 2022-06-03 DIAGNOSIS — E1169 Type 2 diabetes mellitus with other specified complication: Secondary | ICD-10-CM

## 2022-06-03 DIAGNOSIS — Z45812 Encounter for adjustment or removal of left breast implant: Secondary | ICD-10-CM | POA: Diagnosis not present

## 2022-06-03 HISTORY — PX: REMOVAL OF TISSUE EXPANDER AND PLACEMENT OF IMPLANT: SHX6457

## 2022-06-03 LAB — GLUCOSE, CAPILLARY: Glucose-Capillary: 89 mg/dL (ref 70–99)

## 2022-06-03 SURGERY — REMOVAL, TISSUE EXPANDER, BREAST, WITH IMPLANT INSERTION
Anesthesia: General | Site: Chest | Laterality: Left

## 2022-06-03 MED ORDER — ACETAMINOPHEN 500 MG PO TABS
1000.0000 mg | ORAL_TABLET | ORAL | Status: AC
  Administered 2022-06-03: 1000 mg via ORAL

## 2022-06-03 MED ORDER — LIDOCAINE 2% (20 MG/ML) 5 ML SYRINGE
INTRAMUSCULAR | Status: DC | PRN
  Administered 2022-06-03: 60 mg via INTRAVENOUS

## 2022-06-03 MED ORDER — ONDANSETRON HCL 4 MG/2ML IJ SOLN
INTRAMUSCULAR | Status: DC | PRN
  Administered 2022-06-03: 4 mg via INTRAVENOUS

## 2022-06-03 MED ORDER — SUGAMMADEX SODIUM 200 MG/2ML IV SOLN
INTRAVENOUS | Status: DC | PRN
  Administered 2022-06-03: 200 mg via INTRAVENOUS

## 2022-06-03 MED ORDER — BUPIVACAINE HCL (PF) 0.25 % IJ SOLN
INTRAMUSCULAR | Status: AC
  Filled 2022-06-03: qty 30

## 2022-06-03 MED ORDER — CELECOXIB 200 MG PO CAPS
ORAL_CAPSULE | ORAL | Status: AC
  Filled 2022-06-03: qty 1

## 2022-06-03 MED ORDER — CEFAZOLIN SODIUM-DEXTROSE 2-4 GM/100ML-% IV SOLN
2.0000 g | INTRAVENOUS | Status: AC
  Administered 2022-06-03: 2 g via INTRAVENOUS

## 2022-06-03 MED ORDER — MIDAZOLAM HCL 2 MG/2ML IJ SOLN
INTRAMUSCULAR | Status: AC
  Filled 2022-06-03: qty 2

## 2022-06-03 MED ORDER — FENTANYL CITRATE (PF) 100 MCG/2ML IJ SOLN
INTRAMUSCULAR | Status: DC | PRN
  Administered 2022-06-03: 100 ug via INTRAVENOUS

## 2022-06-03 MED ORDER — CHLORHEXIDINE GLUCONATE CLOTH 2 % EX PADS: 6.0000 | MEDICATED_PAD | Freq: Once | CUTANEOUS | Status: DC

## 2022-06-03 MED ORDER — EPHEDRINE 5 MG/ML INJ
INTRAVENOUS | Status: AC
  Filled 2022-06-03: qty 5

## 2022-06-03 MED ORDER — FENTANYL CITRATE (PF) 100 MCG/2ML IJ SOLN
INTRAMUSCULAR | Status: AC
  Filled 2022-06-03: qty 2

## 2022-06-03 MED ORDER — ROCURONIUM BROMIDE 100 MG/10ML IV SOLN
INTRAVENOUS | Status: DC | PRN
  Administered 2022-06-03: 40 mg via INTRAVENOUS

## 2022-06-03 MED ORDER — PHENYLEPHRINE 80 MCG/ML (10ML) SYRINGE FOR IV PUSH (FOR BLOOD PRESSURE SUPPORT)
PREFILLED_SYRINGE | INTRAVENOUS | Status: AC
  Filled 2022-06-03: qty 10

## 2022-06-03 MED ORDER — LACTATED RINGERS IV SOLN: INTRAVENOUS | Status: DC

## 2022-06-03 MED ORDER — GABAPENTIN 300 MG PO CAPS
ORAL_CAPSULE | ORAL | Status: AC
  Filled 2022-06-03: qty 1

## 2022-06-03 MED ORDER — MIDAZOLAM HCL 5 MG/5ML IJ SOLN
INTRAMUSCULAR | Status: DC | PRN
  Administered 2022-06-03: 2 mg via INTRAVENOUS

## 2022-06-03 MED ORDER — ACETAMINOPHEN 500 MG PO TABS
ORAL_TABLET | ORAL | Status: AC
  Filled 2022-06-03: qty 2

## 2022-06-03 MED ORDER — PROPOFOL 10 MG/ML IV BOLUS
INTRAVENOUS | Status: DC | PRN
  Administered 2022-06-03: 110 mg via INTRAVENOUS

## 2022-06-03 MED ORDER — HYDROMORPHONE HCL 1 MG/ML IJ SOLN
INTRAMUSCULAR | Status: AC
  Filled 2022-06-03: qty 0.5

## 2022-06-03 MED ORDER — ONDANSETRON HCL 4 MG/2ML IJ SOLN
INTRAMUSCULAR | Status: AC
  Filled 2022-06-03: qty 2

## 2022-06-03 MED ORDER — DEXAMETHASONE SODIUM PHOSPHATE 10 MG/ML IJ SOLN
INTRAMUSCULAR | Status: DC | PRN
  Administered 2022-06-03: 5 mg via INTRAVENOUS

## 2022-06-03 MED ORDER — HYDROMORPHONE HCL 1 MG/ML IJ SOLN
0.2500 mg | INTRAMUSCULAR | Status: DC | PRN
  Administered 2022-06-03 (×2): 0.5 mg via INTRAVENOUS

## 2022-06-03 MED ORDER — SODIUM CHLORIDE 0.9 % IV SOLN
INTRAVENOUS | Status: DC | PRN
  Administered 2022-06-03: 500 mL

## 2022-06-03 MED ORDER — CEFAZOLIN SODIUM-DEXTROSE 2-4 GM/100ML-% IV SOLN
INTRAVENOUS | Status: AC
  Filled 2022-06-03: qty 100

## 2022-06-03 MED ORDER — SODIUM CHLORIDE 0.9 % IV SOLN
INTRAVENOUS | Status: AC
  Filled 2022-06-03: qty 10

## 2022-06-03 MED ORDER — GABAPENTIN 300 MG PO CAPS
300.0000 mg | ORAL_CAPSULE | ORAL | Status: AC
  Administered 2022-06-03: 300 mg via ORAL

## 2022-06-03 MED ORDER — BUPIVACAINE HCL (PF) 0.25 % IJ SOLN
INTRAMUSCULAR | Status: DC | PRN
  Administered 2022-06-03: 20 mL

## 2022-06-03 MED ORDER — CELECOXIB 200 MG PO CAPS
200.0000 mg | ORAL_CAPSULE | ORAL | Status: AC
  Administered 2022-06-03: 200 mg via ORAL

## 2022-06-03 MED ORDER — ROCURONIUM BROMIDE 10 MG/ML (PF) SYRINGE
PREFILLED_SYRINGE | INTRAVENOUS | Status: AC
  Filled 2022-06-03: qty 10

## 2022-06-03 MED ORDER — LIDOCAINE 2% (20 MG/ML) 5 ML SYRINGE
INTRAMUSCULAR | Status: AC
  Filled 2022-06-03: qty 5

## 2022-06-03 MED ORDER — PHENYLEPHRINE HCL (PRESSORS) 10 MG/ML IV SOLN
INTRAVENOUS | Status: DC | PRN
  Administered 2022-06-03: 40 ug via INTRAVENOUS
  Administered 2022-06-03 (×3): 80 ug via INTRAVENOUS
  Administered 2022-06-03: 40 ug via INTRAVENOUS

## 2022-06-03 MED ORDER — DEXAMETHASONE SODIUM PHOSPHATE 10 MG/ML IJ SOLN
INTRAMUSCULAR | Status: AC
  Filled 2022-06-03: qty 1

## 2022-06-03 MED ORDER — PROPOFOL 10 MG/ML IV BOLUS
INTRAVENOUS | Status: AC
  Filled 2022-06-03: qty 20

## 2022-06-03 MED ORDER — EPHEDRINE SULFATE (PRESSORS) 50 MG/ML IJ SOLN
INTRAMUSCULAR | Status: DC | PRN
  Administered 2022-06-03: 15 mg via INTRAVENOUS
  Administered 2022-06-03 (×2): 5 mg via INTRAVENOUS
  Administered 2022-06-03 (×2): 10 mg via INTRAVENOUS

## 2022-06-03 SURGICAL SUPPLY — 68 items
ADH SKN CLS APL DERMABOND .7 (GAUZE/BANDAGES/DRESSINGS) ×2
APL PRP STRL LF DISP 70% ISPRP (MISCELLANEOUS) ×1
BAG DECANTER FOR FLEXI CONT (MISCELLANEOUS) ×1 IMPLANT
BINDER BREAST LRG (GAUZE/BANDAGES/DRESSINGS) IMPLANT
BINDER BREAST MEDIUM (GAUZE/BANDAGES/DRESSINGS) IMPLANT
BINDER BREAST XLRG (GAUZE/BANDAGES/DRESSINGS) IMPLANT
BINDER BREAST XXLRG (GAUZE/BANDAGES/DRESSINGS) IMPLANT
BLADE SURG 10 STRL SS (BLADE) ×1 IMPLANT
BLADE SURG 15 STRL LF DISP TIS (BLADE) IMPLANT
BLADE SURG 15 STRL SS (BLADE)
BNDG GAUZE DERMACEA FLUFF 4 (GAUZE/BANDAGES/DRESSINGS) ×2 IMPLANT
BNDG GZE DERMACEA 4 6PLY (GAUZE/BANDAGES/DRESSINGS) ×2
CANISTER SUCT 1200ML W/VALVE (MISCELLANEOUS) ×1 IMPLANT
CHLORAPREP W/TINT 26 (MISCELLANEOUS) ×1 IMPLANT
COVER BACK TABLE 60X90IN (DRAPES) ×1 IMPLANT
COVER MAYO STAND STRL (DRAPES) ×1 IMPLANT
DERMABOND ADVANCED .7 DNX12 (GAUZE/BANDAGES/DRESSINGS) ×2 IMPLANT
DRAIN CHANNEL 15F RND FF W/TCR (WOUND CARE) IMPLANT
DRAPE TOP ARMCOVERS (MISCELLANEOUS) ×1 IMPLANT
DRAPE U-SHAPE 76X120 STRL (DRAPES) ×1 IMPLANT
DRAPE UTILITY XL STRL (DRAPES) ×1 IMPLANT
ELECT BLADE 4.0 EZ CLEAN MEGAD (MISCELLANEOUS) ×1
ELECT COATED BLADE 2.86 ST (ELECTRODE) ×1 IMPLANT
ELECT REM PT RETURN 9FT ADLT (ELECTROSURGICAL) ×1
ELECTRODE BLDE 4.0 EZ CLN MEGD (MISCELLANEOUS) ×1 IMPLANT
ELECTRODE REM PT RTRN 9FT ADLT (ELECTROSURGICAL) ×1 IMPLANT
EVACUATOR SILICONE 100CC (DRAIN) IMPLANT
GAUZE PAD ABD 8X10 STRL (GAUZE/BANDAGES/DRESSINGS) ×2 IMPLANT
GLOVE BIO SURGEON STRL SZ 6 (GLOVE) ×2 IMPLANT
GOWN STRL REUS W/ TWL LRG LVL3 (GOWN DISPOSABLE) ×2 IMPLANT
GOWN STRL REUS W/TWL LRG LVL3 (GOWN DISPOSABLE) ×2
IMPL BREAST SALINE 600-640C (Breast) IMPLANT
IMPLANT BREAST SALINE 600-640C (Breast) ×1 IMPLANT
IV NS 1000ML (IV SOLUTION)
IV NS 1000ML BAXH (IV SOLUTION) IMPLANT
IV NS 500ML (IV SOLUTION)
IV NS 500ML BAXH (IV SOLUTION) IMPLANT
MARKER SKIN DUAL TIP RULER LAB (MISCELLANEOUS) IMPLANT
NDL FILTER BLUNT 18X1 1/2 (NEEDLE) IMPLANT
NDL HYPO 25X1 1.5 SAFETY (NEEDLE) IMPLANT
NEEDLE FILTER BLUNT 18X1 1/2 (NEEDLE) IMPLANT
NEEDLE HYPO 25X1 1.5 SAFETY (NEEDLE) IMPLANT
PACK BASIN DAY SURGERY FS (CUSTOM PROCEDURE TRAY) ×1 IMPLANT
PENCIL SMOKE EVACUATOR (MISCELLANEOUS) ×1 IMPLANT
PIN SAFETY STERILE (MISCELLANEOUS) IMPLANT
SHEET MEDIUM DRAPE 40X70 STRL (DRAPES) ×2 IMPLANT
SIZER BREAST SGL USE HP 650CC (SIZER) ×1
SIZER BRST SGL USE HP 650CC (SIZER) IMPLANT
SLEEVE SCD COMPRESS KNEE MED (STOCKING) ×1 IMPLANT
SPIKE FLUID TRANSFER (MISCELLANEOUS) IMPLANT
SPONGE T-LAP 18X18 ~~LOC~~+RFID (SPONGE) ×2 IMPLANT
STAPLER VISISTAT 35W (STAPLE) ×1 IMPLANT
SUT ETHILON 2 0 FS 18 (SUTURE) IMPLANT
SUT MNCRL AB 4-0 PS2 18 (SUTURE) ×1 IMPLANT
SUT PDS AB 2-0 CT2 27 (SUTURE) IMPLANT
SUT SILK 2 0 SH (SUTURE) IMPLANT
SUT VIC AB 3-0 PS1 18 (SUTURE)
SUT VIC AB 3-0 PS1 18XBRD (SUTURE) IMPLANT
SUT VIC AB 3-0 SH 27 (SUTURE) ×1
SUT VIC AB 3-0 SH 27X BRD (SUTURE) ×1 IMPLANT
SUT VICRYL 4-0 PS2 18IN ABS (SUTURE) ×1 IMPLANT
SYR 20ML LL LF (SYRINGE) IMPLANT
SYR BULB IRRIG 60ML STRL (SYRINGE) ×2 IMPLANT
SYR CONTROL 10ML LL (SYRINGE) IMPLANT
TOWEL GREEN STERILE FF (TOWEL DISPOSABLE) ×2 IMPLANT
TUBE CONNECTING 20X1/4 (TUBING) ×2 IMPLANT
UNDERPAD 30X36 HEAVY ABSORB (UNDERPADS AND DIAPERS) ×2 IMPLANT
YANKAUER SUCT BULB TIP NO VENT (SUCTIONS) ×1 IMPLANT

## 2022-06-03 NOTE — Op Note (Signed)
Operative Note   DATE OF OPERATION: 10.27.23  LOCATION: [facility]-outpatient  SURGICAL DIVISION: Plastic Surgery  PREOPERATIVE DIAGNOSES:  1. History breast cancer 2. Acquired absence breasts 3. History therapeutic radiation  POSTOPERATIVE DIAGNOSES:  same  PROCEDURE:  Removal left chest tissue expander and placement saline implant  SURGEON: Irene Limbo MD MBA  ASSISTANT: none  ANESTHESIA:  General.   EBL: 25 ml  COMPLICATIONS: None immediate.   INDICATIONS FOR PROCEDURE:  The patient, Madeline Chandler, is a 61 y.o. female born on 1960-12-09, is here for staged breast reconstruction following skin reduction mastectomy with immediate prepectoral tissue expander reconstruction.   FINDINGS: Complete incorporation ADM noted. Natrelle Smooth Round High Projection Saline 600 ml implant placed REF [id number]-600 fill volume 640 ml. [id number]  DESCRIPTION OF PROCEDURE:  The patient's operative site was marked with the patient in the preoperative area. The patient was taken to the operating room. SCDs were placed and IV antibiotics were given. The patient's operative site was prepped and draped in a sterile fashion. A time out was performed and all information was confirmed to be correct. Incision made in left inframammary fold mastectomy scar and carried through superficial fascia and ADM. Expander removed. Superior capsulotomy performed. Radial scoring capsule completed over lower pole. Sizer placed, and patient brought to upright position. Patient assessed for symmetry. High Profile 677m implant selected for left chest placement. Patient returned to supine position.    Cavity irrigated with saline solution containing Ancef gentamicin and Betadine. Hemostasis ensured. Local anesthetic infiltrated. Implant prepared and placed in cavity. Implant filled to 640 ml. Fill tubing removed and tab closure ensured. Implant orientation ensured. Closure completed with 3-0 vicryl to approximate  capsule and superficial fascia, 4-0 vicryl in dermis, and 4-0 monocryl subcuticular skin closure. Over medial extent inframammary fold scar, fullness contour noted. Elliptical skin excision completed in this area. Layered closure completed with interrupted 4-0 vicryl in dermis and running 4-0 monocryl subcuticular skin closure. Dermabond applied to incisions followed by dry dressing, breast binder.  The patient was allowed to wake from anesthesia, extubated and taken to the recovery room in satisfactory condition.   SPECIMENS: none  DRAINS: none  BIrene Limbo MD [facility] Center[facility] Re[facility]  Office/ physician access line after hours [phone]

## 2022-06-03 NOTE — Anesthesia Procedure Notes (Signed)
Procedure Name: Intubation Date/Time: 06/03/2022 8:43 AM  Performed by: Lavonia Dana, CRNAPre-anesthesia Checklist: Patient identified, Emergency Drugs available, Suction available and Patient being monitored Patient Re-evaluated:Patient Re-evaluated prior to induction Oxygen Delivery Method: Circle system utilized Preoxygenation: Pre-oxygenation with 100% oxygen Induction Type: IV induction Ventilation: Mask ventilation without difficulty Laryngoscope Size: Mac and 3 Grade View: Grade I Tube type: Oral Tube size: 7.0 mm Number of attempts: 1 Airway Equipment and Method: Stylet and Bite block Placement Confirmation: ETT inserted through vocal cords under direct vision, positive ETCO2 and breath sounds checked- equal and bilateral Secured at: 22 cm Tube secured with: Tape Dental Injury: Teeth and Oropharynx as per pre-operative assessment

## 2022-06-03 NOTE — Discharge Instructions (Addendum)
Tylenol after 1:30pm Ibuprofen after 3:30   Post Anesthesia Home Care Instructions  Activity: Get plenty of rest for the remainder of the day. A responsible individual must stay with you for 24 hours following the procedure.  For the next 24 hours, DO NOT: -Drive a car -Paediatric nurse -Drink alcoholic beverages -Take any medication unless instructed by your physician -Make any legal decisions or sign important papers.  Meals: Start with liquid foods such as gelatin or soup. Progress to regular foods as tolerated. Avoid greasy, spicy, heavy foods. If nausea and/or vomiting occur, drink only clear liquids until the nausea and/or vomiting subsides. Call your physician if vomiting continues.  Special Instructions/Symptoms: Your throat may feel dry or sore from the anesthesia or the breathing tube placed in your throat during surgery. If this causes discomfort, gargle with warm salt water. The discomfort should disappear within 24 hours.  If you had a scopolamine patch placed behind your ear for the management of post- operative nausea and/or vomiting:  1. The medication in the patch is effective for 72 hours, after which it should be removed.  Wrap patch in a tissue and discard in the trash. Wash hands thoroughly with soap and water. 2. You may remove the patch earlier than 72 hours if you experience unpleasant side effects which may include dry mouth, dizziness or visual disturbances. 3. Avoid touching the patch. Wash your hands with soap and water after contact with the patch.

## 2022-06-03 NOTE — Transfer of Care (Signed)
Immediate Anesthesia Transfer of Care Note  Patient: Madeline Chandler  Procedure(s) Performed: REMOVAL OF TISSUE EXPANDER AND PLACEMENT OF IMPLANT (Left: Chest)  Patient Location: PACU  Anesthesia Type:General  Level of Consciousness: drowsy  Airway & Oxygen Therapy: Patient Spontanous Breathing and Patient connected to face mask oxygen  Post-op Assessment: Report given to RN and Post -op Vital signs reviewed and stable  Post vital signs: Reviewed and stable  Last Vitals:  Vitals Value Taken Time  BP 151/85 06/03/22 0958  Temp    Pulse 85 06/03/22 1000  Resp 10 06/03/22 1000  SpO2 99 % 06/03/22 1000  Vitals shown include unvalidated device data.  Last Pain:  Vitals:   06/03/22 0720  TempSrc: Oral  PainSc: 0-No pain         Complications: No notable events documented.

## 2022-06-03 NOTE — Anesthesia Preprocedure Evaluation (Addendum)
Anesthesia Evaluation  Patient identified by MRN, date of birth, ID band Patient awake    Reviewed: Allergy & Precautions, H&P , NPO status , Patient's Chart, lab work & pertinent test results, reviewed documented beta blocker date and time   Airway Mallampati: II  TM Distance: >3 FB Neck ROM: Full    Dental no notable dental hx. (+) Teeth Intact, Dental Advisory Given   Pulmonary sleep apnea ,    Pulmonary exam normal breath sounds clear to auscultation       Cardiovascular hypertension, Pt. on medications and Pt. on home beta blockers  Rhythm:Regular Rate:Normal     Neuro/Psych  Headaches, Anxiety Depression    GI/Hepatic Neg liver ROS, GERD  Medicated,  Endo/Other  diabetes, Type 2, Oral Hypoglycemic Agents  Renal/GU negative Renal ROS  negative genitourinary   Musculoskeletal  (+) Arthritis , Osteoarthritis,    Abdominal   Peds  Hematology negative hematology ROS (+)   Anesthesia Other Findings   Reproductive/Obstetrics negative OB ROS                            Anesthesia Physical Anesthesia Plan  ASA: 3  Anesthesia Plan: General   Post-op Pain Management: Tylenol PO (pre-op)*   Induction: Intravenous  PONV Risk Score and Plan: 4 or greater and Ondansetron, Dexamethasone and Midazolam  Airway Management Planned: Oral ETT  Additional Equipment:   Intra-op Plan:   Post-operative Plan: Extubation in OR  Informed Consent: I have reviewed the patients History and Physical, chart, labs and discussed the procedure including the risks, benefits and alternatives for the proposed anesthesia with the patient or authorized representative who has indicated his/her understanding and acceptance.     Dental advisory given  Plan Discussed with: CRNA  Anesthesia Plan Comments:         Anesthesia Quick Evaluation

## 2022-06-03 NOTE — Interval H&P Note (Signed)
History and Physical Interval Note:  06/03/2022 6:45 AM  Madeline Chandler  has presented today for surgery, with the diagnosis of history breast cancer.  The various methods of treatment have been discussed with the patient and family. After consideration of risks, benefits and other options for treatment, the patient has consented to  Procedure(s): REMOVAL OF TISSUE EXPANDER AND PLACEMENT OF IMPLANT (Left) as a surgical intervention.  The patient's history has been reviewed, patient examined, no change in status, stable for surgery.  I have reviewed the patient's chart and labs.  Questions were answered to the patient's satisfaction.     Arnoldo Hooker Kae Lauman

## 2022-06-03 NOTE — Anesthesia Postprocedure Evaluation (Signed)
Anesthesia Post Note  Patient: RETHA BITHER  Procedure(s) Performed: REMOVAL OF TISSUE EXPANDER AND PLACEMENT OF IMPLANT (Left: Chest)     Patient location during evaluation: PACU Anesthesia Type: General Level of consciousness: awake and alert Pain management: pain level controlled Vital Signs Assessment: post-procedure vital signs reviewed and stable Respiratory status: spontaneous breathing, nonlabored ventilation and respiratory function stable Cardiovascular status: blood pressure returned to baseline and stable Postop Assessment: no apparent nausea or vomiting Anesthetic complications: no   No notable events documented.  Last Vitals:  Vitals:   06/03/22 1030 06/03/22 1045  BP: (!) 141/74 138/73  Pulse: 73 77  Resp: 16 12  Temp:    SpO2: 95% 94%    Last Pain:  Vitals:   06/03/22 1028  TempSrc:   PainSc: 6                  Jayzen Paver,W. EDMOND

## 2022-06-06 ENCOUNTER — Encounter (HOSPITAL_BASED_OUTPATIENT_CLINIC_OR_DEPARTMENT_OTHER): Payer: Self-pay | Admitting: Plastic Surgery

## 2022-06-08 ENCOUNTER — Telehealth: Payer: Self-pay

## 2022-06-08 ENCOUNTER — Other Ambulatory Visit: Payer: Self-pay | Admitting: Family Medicine

## 2022-06-08 DIAGNOSIS — F411 Generalized anxiety disorder: Secondary | ICD-10-CM

## 2022-06-08 NOTE — Telephone Encounter (Signed)
PA initiated via Covermymeds; KEY: [id number]. Awaiting determination.  Plan requires medical records showing Pt has type 2 diabetes. They should call w/ their fax number for [place] to send it to.

## 2022-06-09 NOTE — Telephone Encounter (Signed)
PA approved. Effective through 06/09/2023 or until coverage for the medication is no longer available under your benefit plan or the medication becomes subject to a pharmacy benefit coverage requirement, such as supply limits or notification, whicheveroccurs first as allowed by law.

## 2022-07-19 ENCOUNTER — Encounter: Payer: Self-pay | Admitting: Family Medicine

## 2022-07-19 ENCOUNTER — Ambulatory Visit (INDEPENDENT_AMBULATORY_CARE_PROVIDER_SITE_OTHER): Payer: 59 | Admitting: Family Medicine

## 2022-07-19 VITALS — BP 120/72 | HR 71 | Temp 98.4°F | Ht 65.5 in | Wt 182.1 lb

## 2022-07-19 DIAGNOSIS — E1169 Type 2 diabetes mellitus with other specified complication: Secondary | ICD-10-CM | POA: Diagnosis not present

## 2022-07-19 DIAGNOSIS — E669 Obesity, unspecified: Secondary | ICD-10-CM | POA: Diagnosis not present

## 2022-07-19 DIAGNOSIS — K0889 Other specified disorders of teeth and supporting structures: Secondary | ICD-10-CM

## 2022-07-19 DIAGNOSIS — J3489 Other specified disorders of nose and nasal sinuses: Secondary | ICD-10-CM | POA: Diagnosis not present

## 2022-07-19 MED ORDER — AMOXICILLIN 500 MG PO CAPS: 500.0000 mg | ORAL_CAPSULE | Freq: Two times a day (BID) | ORAL | 0 refills | Status: AC

## 2022-07-19 MED ORDER — FLUCONAZOLE 150 MG PO TABS: ORAL_TABLET | ORAL | 0 refills | Status: DC

## 2022-07-19 MED ORDER — OZEMPIC (2 MG/DOSE) 8 MG/3ML ~~LOC~~ SOPN: PEN_INJECTOR | SUBCUTANEOUS | 3 refills | Status: DC

## 2022-07-19 NOTE — Progress Notes (Signed)
Chief Complaint  Patient presents with   Sinus Problem    Facial pressure Gum pain     Madeline Chandler here for URI complaints.  Duration: 4 days  Associated symptoms: sinus pain and dental pain Denies: sinus congestion, rhinorrhea, itchy watery eyes, ear pain, ear drainage, sore throat, wheezing, shortness of breath, myalgia, and fevers Treatment to date: None Sick contacts: No  Past Medical History:  Diagnosis Date   Ankle fracture 03/09/2019   Left   Arthritis    "hips" (10/28/2016)   Avascular necrosis of bones of both hips    Breast cancer ([place]) 02/2022   left breast DCIS   Bulging lumbar disc    2   Cervical pain (neck)    De Quervain's tenosynovitis, bilateral    Ductal carcinoma in situ (DCIS) of right breast 1997; 2018   s/p chemotherapy and radiation treatment   Essential hypertension    08/22/11: BUN 15, creatinine 0.95, GFR 79      Generalized anxiety disorder    GERD (gastroesophageal reflux disease)    History of colonic polyps    History of migraine headaches    "none in the last few years; had them mostly in late 20's/mid 30's"   History of therapeutic radiation    Hypokalemia    Insomnia    Major depressive disorder    Mild neurocognitive disorder, unclear etiology 04/24/2020   OSA (obstructive sleep apnea) 02/19/2018   Awaiting CPAP machine as of 04/24/2020   Restless leg syndrome    Type 2 diabetes mellitus    Upper respiratory infection 07/16/2010    Objective BP 120/72 (BP Location: Right Arm, Patient Position: Sitting, Cuff Size: Normal)   Pulse 71   Temp 98.4 F (36.9 C) (Oral)   Ht 5' 5.5" (1.664 m)   Wt 182 lb 2 oz (82.6 kg)   SpO2 98%   BMI 29.85 kg/m  General: Awake, alert, appears stated age HEENT: AT, [place], ears patent b/l and TM's neg, nares patent w/o discharge, pharynx pink and without exudates, MMM, ttp over L max sinus Neck: No masses or asymmetry Heart: RRR Lungs: CTAB, no accessory muscle use Psych: Age appropriate  judgment and insight, normal mood and affect  Sinus pain  Dentalgia - Plan: amoxicillin (AMOXIL) 500 MG capsule  Diabetes mellitus type 2 in obese ([place]) - Plan: Semaglutide, 2 MG/DOSE, (OZEMPIC, 2 MG/DOSE,) 8 MG/3ML SOPN  Obesity (BMI 30-39.9) - Plan: Semaglutide, 2 MG/DOSE, (OZEMPIC, 2 MG/DOSE,) 8 MG/3ML SOPN  Continue to push fluids, practice good hand hygiene, cover mouth when coughing. 7 d course of amoxicillin 500 mg bid for dental issues, should mostly cover for any sinus issues too.  F/u prn. If starting to experience fevers, shaking, or shortness of breath, seek immediate care. Pt voiced understanding and agreement to the plan.  Hughes, DO 07/19/22 10:52 AM

## 2022-07-19 NOTE — Patient Instructions (Addendum)
Ice/cold pack over area for 10-15 min twice daily.  Ibuprofen 400-600 mg (2-3 over the counter strength tabs) every 6 hours as needed for pain.  If you start having fevers, shaking or shortness of breath, seek immediate care.  OK to take Tylenol 1000 mg (2 extra strength tabs) or 975 mg (3 regular strength tabs) every 6 hours as needed.  Let us know if you need anything.

## 2022-07-25 ENCOUNTER — Other Ambulatory Visit: Payer: Self-pay | Admitting: Family Medicine

## 2022-07-27 ENCOUNTER — Telehealth: Payer: Self-pay | Admitting: Family Medicine

## 2022-07-27 NOTE — Telephone Encounter (Signed)
Will be on the look out for it.

## 2022-07-27 NOTE — Telephone Encounter (Signed)
Patient said she faxed over a form for Dr. Nani Ravens to fill out but she forgot the cover sheet so just wanted Robin to be aware.

## 2022-08-11 ENCOUNTER — Other Ambulatory Visit: Payer: Self-pay | Admitting: Family Medicine

## 2022-08-11 DIAGNOSIS — F411 Generalized anxiety disorder: Secondary | ICD-10-CM

## 2022-08-18 ENCOUNTER — Telehealth: Payer: Self-pay | Admitting: Family Medicine

## 2022-08-18 NOTE — Telephone Encounter (Signed)
Pt called asking if it would be recommended for her to get the RSV vaccine. Please Advise.

## 2022-08-19 NOTE — Telephone Encounter (Signed)
Pt notified 

## 2022-08-29 ENCOUNTER — Other Ambulatory Visit: Payer: Self-pay | Admitting: Family Medicine

## 2022-08-29 DIAGNOSIS — I10 Essential (primary) hypertension: Secondary | ICD-10-CM

## 2022-09-28 ENCOUNTER — Other Ambulatory Visit: Payer: Self-pay | Admitting: Family Medicine

## 2022-09-30 ENCOUNTER — Inpatient Hospital Stay: Payer: 59 | Attending: Oncology | Admitting: Oncology

## 2022-09-30 VITALS — BP 126/85 | HR 81 | Temp 98.2°F | Resp 20 | Ht 65.0 in | Wt 182.4 lb

## 2022-09-30 DIAGNOSIS — Z9882 Breast implant status: Secondary | ICD-10-CM | POA: Insufficient documentation

## 2022-09-30 DIAGNOSIS — D0512 Intraductal carcinoma in situ of left breast: Secondary | ICD-10-CM

## 2022-09-30 DIAGNOSIS — Z9013 Acquired absence of bilateral breasts and nipples: Secondary | ICD-10-CM | POA: Insufficient documentation

## 2022-09-30 DIAGNOSIS — Z86000 Personal history of in-situ neoplasm of breast: Secondary | ICD-10-CM | POA: Insufficient documentation

## 2022-09-30 DIAGNOSIS — Z90710 Acquired absence of both cervix and uterus: Secondary | ICD-10-CM | POA: Diagnosis not present

## 2022-09-30 NOTE — Progress Notes (Signed)
  [facility] OFFICE PROGRESS NOTE   Diagnosis: Breast cancer  INTERVAL HISTORY:   Ms. Madeline Chandler returns as scheduled.  She underwent removal of a left chest tissue expander and placement of a saline implant on 06/03/2022.  She is considering undergoing a nipple tattoo.  She feels well.  Objective:  Vital signs in last 24 hours:  Blood pressure 126/85, pulse 81, temperature 98.2 F (36.8 C), temperature source Oral, resp. rate 20, height 5' 5"$  (1.651 m), weight 182 lb 6.4 oz (82.7 kg), SpO2 100 %.    HEENT: Neck without mass Lymphatics: No cervical, supraclavicular, or axillary nodes Resp: Lungs clear bilaterally Cardio: Regular rate and rhythm GI: No hepatosplenomegaly Vascular: No leg edema Breast: Status post bilateral mastectomy.  Right TRAM reconstruction.  Left implant.  No evidence for chest wall tumor recurrence.  Lab Results:  Lab Results  Component Value Date   WBC 4.6 02/11/2022   HGB 13.1 02/11/2022   HCT 39.7 02/11/2022   MCV 87.9 02/11/2022   PLT 226.0 02/11/2022   NEUTROABS 6.4 03/09/2019    CMP  Lab Results  Component Value Date   NA 135 05/31/2022   K 3.9 05/31/2022   CL 104 05/31/2022   CO2 27 05/31/2022   GLUCOSE 97 05/31/2022   BUN 6 (L) 05/31/2022   CREATININE 0.97 05/31/2022   CALCIUM 9.7 05/31/2022   PROT 7.6 02/11/2022   ALBUMIN 4.3 02/11/2022   AST 19 02/11/2022   ALT 19 02/11/2022   ALKPHOS 89 02/11/2022   BILITOT 0.8 02/11/2022   GFRNONAA >60 05/31/2022   GFRAA >60 10/28/2019     Medications: I have reviewed the patient's current medications.   Assessment/Plan: 1.Stage II (T2 N0) sees right-sided breast cancer diagnosed in September 1997, status post a lumpectomy, sentinel lymph node biopsy, and right axillary dissection and radiation followed by adjuvant chemotherapy. She completed 5 years of tamoxifen.   2.  DCIS with a microscopic area of invasive carcinoma noted on biopsy of an area of distortion in the upper  inner right breast January 2018 DCIS is ER positive, PR positive, and HER-2 positive (not enough invasive tumor for a breast prognostic profile) Right mastectomy 10/28/2016, 1.4 cm intermediate grade DCIS, negative resection margins, no residual invasive carcinoma identified, no lymph nodes identified in submitted axillary contents Adjuvant Femara 11/11/2016, discontinued 03/29/2022 3. Hysterectomy and bilateral oophorectomy April 2008   4. Right breast implant and left breast reduction 03/07/2017  5.  Negative genetic testing 2018-[employer] Common Cancer Panel-normal  6.  Left breast DCIS Diagnostic left mammogram 01/07/2022-diffuse suspicious linear branching calcifications throughout the left breast suspicious for DCIS Stereotactic core biopsy of outer extent of left breast calcifications and inner extent of left breast calcifications 01/14/2022-DCIS, solid/apocrine with comedonecrosis and calcifications, grade 2/3, foci suspicious for microinvasion involving the inner and outer aspect biopsies, ER 95%, PR 0%,Ki-67 10% Left mastectomy and immediate reconstruction 03/15/2022-DCIS involving the lateral and medial quadrants and within a lactiferous duct beneath the nipple, high-grade, necrosis present, negative margins, negative for invasive carcinoma         Disposition: Ms. Licata has a history of right invasive breast cancer and bilateral DCIS.  She completed letrozole therapy in August 2023.  The left DCIS was diagnosed in June 2023.  She is in clinical remission. She will return for an office visit in 6 months.   Betsy Coder, MD  09/30/2022  12:23 PM

## 2022-10-03 ENCOUNTER — Other Ambulatory Visit: Payer: Self-pay | Admitting: *Deleted

## 2022-10-03 MED ORDER — AMLODIPINE BESYLATE 5 MG PO TABS: 5.0000 mg | ORAL_TABLET | Freq: Every day | ORAL | 1 refills | Status: DC

## 2022-10-15 ENCOUNTER — Other Ambulatory Visit: Payer: Self-pay | Admitting: Family Medicine

## 2022-10-15 DIAGNOSIS — F411 Generalized anxiety disorder: Secondary | ICD-10-CM

## 2022-11-24 ENCOUNTER — Other Ambulatory Visit: Payer: Self-pay | Admitting: Family Medicine

## 2022-11-24 DIAGNOSIS — I10 Essential (primary) hypertension: Secondary | ICD-10-CM

## 2022-12-17 ENCOUNTER — Other Ambulatory Visit: Payer: Self-pay | Admitting: Family Medicine

## 2022-12-17 DIAGNOSIS — F411 Generalized anxiety disorder: Secondary | ICD-10-CM

## 2023-01-05 LAB — HM DEXA SCAN: HM Dexa Scan: NORMAL

## 2023-01-06 LAB — HM PAP SMEAR

## 2023-02-19 ENCOUNTER — Other Ambulatory Visit: Payer: Self-pay | Admitting: Family Medicine

## 2023-02-19 DIAGNOSIS — F411 Generalized anxiety disorder: Secondary | ICD-10-CM

## 2023-02-22 ENCOUNTER — Encounter: Payer: Self-pay | Admitting: Family Medicine

## 2023-02-22 ENCOUNTER — Other Ambulatory Visit: Payer: Self-pay | Admitting: Family Medicine

## 2023-02-22 ENCOUNTER — Ambulatory Visit (INDEPENDENT_AMBULATORY_CARE_PROVIDER_SITE_OTHER): Payer: 59 | Admitting: Family Medicine

## 2023-02-22 VITALS — BP 130/82 | HR 75 | Temp 98.6°F | Resp 20 | Ht 65.0 in | Wt 186.0 lb

## 2023-02-22 DIAGNOSIS — Z6830 Body mass index (BMI) 30.0-30.9, adult: Secondary | ICD-10-CM

## 2023-02-22 DIAGNOSIS — E669 Obesity, unspecified: Secondary | ICD-10-CM | POA: Diagnosis not present

## 2023-02-22 DIAGNOSIS — Z0000 Encounter for general adult medical examination without abnormal findings: Secondary | ICD-10-CM | POA: Diagnosis not present

## 2023-02-22 DIAGNOSIS — R2689 Other abnormalities of gait and mobility: Secondary | ICD-10-CM | POA: Diagnosis not present

## 2023-02-22 DIAGNOSIS — E1169 Type 2 diabetes mellitus with other specified complication: Secondary | ICD-10-CM

## 2023-02-22 DIAGNOSIS — E876 Hypokalemia: Secondary | ICD-10-CM

## 2023-02-22 LAB — COMPREHENSIVE METABOLIC PANEL
ALT: 15 U/L (ref 0–35)
AST: 18 U/L (ref 0–37)
Albumin: 4.1 g/dL (ref 3.5–5.2)
Alkaline Phosphatase: 111 U/L (ref 39–117)
BUN: 18 mg/dL (ref 6–23)
CO2: 36 mEq/L — ABNORMAL HIGH (ref 19–32)
Calcium: 10.4 mg/dL (ref 8.4–10.5)
Chloride: 96 mEq/L (ref 96–112)
Creatinine, Ser: 0.88 mg/dL (ref 0.40–1.20)
GFR: 70.14 mL/min (ref >60.00)
Glucose, Bld: 89 mg/dL (ref 70–99)
Potassium: 3.2 mEq/L — ABNORMAL LOW (ref 3.5–5.1)
Sodium: 139 mEq/L (ref 135–145)
Total Bilirubin: 0.6 mg/dL (ref 0.2–1.2)
Total Protein: 7.5 g/dL (ref 6.0–8.3)

## 2023-02-22 LAB — CBC
HCT: 39.4 % (ref 36.0–46.0)
Hemoglobin: 12.7 g/dL (ref 12.0–15.0)
MCHC: 32.2 g/dL (ref 30.0–36.0)
MCV: 89 fl (ref 78.0–100.0)
Platelets: 247 10*3/uL (ref 150.0–400.0)
RBC: 4.43 Mil/uL (ref 3.87–5.11)
RDW: 13.8 % (ref 11.5–15.5)
WBC: 5.7 10*3/uL (ref 4.0–10.5)

## 2023-02-22 LAB — LIPID PANEL
Cholesterol: 199 mg/dL (ref 0–200)
HDL: 51.7 mg/dL (ref >39.00)
LDL Cholesterol: 130 mg/dL — ABNORMAL HIGH (ref 0–99)
NonHDL: 147.07
Total CHOL/HDL Ratio: 4
Triglycerides: 85 mg/dL (ref 0.0–149.0)
VLDL: 17 mg/dL (ref 0.0–40.0)

## 2023-02-22 LAB — HEMOGLOBIN A1C: Hgb A1c MFr Bld: 6.2 % (ref 4.6–6.5)

## 2023-02-22 LAB — MICROALBUMIN / CREATININE URINE RATIO
Creatinine,U: 206.7 mg/dL
Microalb Creat Ratio: 0.8 mg/g (ref 0.0–30.0)
Microalb, Ur: 1.6 mg/dL (ref 0.0–1.9)

## 2023-02-22 NOTE — Progress Notes (Addendum)
Chief Complaint  Patient presents with   Annual Exam     Well Woman Madeline Chandler is here for a complete physical.   Her last physical was >1 year ago.  Current diet: in general, a "healthy" diet. Current exercise: walking. Weight is stable and she denies fatigue out of ordinary. Seatbelt? Yes Advanced directive? No  Health Maintenance Pap/HPV- N/A, hysterectomy Mammogram- Yes Colon cancer screening-Yes Shingrix- Yes Tetanus- Yes Hep C screening- Yes HIV screening- Yes  Balance issues Unsteady gait for the past yr. Not improving. Walks but does not do much strength training. No lightheaded or dizziness. No pain or trauma. Has not tried anything to address this.   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   Bulging lumbar disc    2   Cervical pain (neck)    De Quervain's tenosynovitis, bilateral    Ductal carcinoma in situ (DCIS) of right breast 1997; 2018   s/p chemotherapy and radiation treatment   Essential hypertension    08/22/11: BUN 15, creatinine 0.95, GFR 79      Generalized anxiety disorder    GERD (gastroesophageal reflux disease)    History of colonic polyps    History of migraine headaches    "none in the last few years; had them mostly in late 20's/mid 30's"   History of therapeutic radiation    Hypokalemia    Insomnia    Major depressive disorder    Mild neurocognitive disorder, unclear etiology 04/24/2020   OSA (obstructive sleep apnea) 02/19/2018   Awaiting CPAP machine as of 04/24/2020   Restless leg syndrome    Type 2 diabetes mellitus    Upper respiratory infection 07/16/2010     Past Surgical History:  Procedure Laterality Date   ABDOMINAL HYSTERECTOMY  2000s   ANTERIOR CERVICAL DECOMP/DISCECTOMY FUSION N/A 12/02/2015   Procedure: ANTERIOR CERVICAL DECOMPRESSION/DISCECTOMY FUSION  C4 - C7  3 LEVELS;  Surgeon: Venita Lick, MD;  Location: [facility];  Service: Orthopedics;  Laterality: N/A;   BACK SURGERY     BREAST BIOPSY Right 1997; 08/2016   BREAST LUMPECTOMY Right 1997   BREAST RECONSTRUCTION Right 07/07/2017   Procedure: RIGHT NIPPLE AEROLA RECONSTRUCTION WITH LOCAL FLAP FROM RIGHT GROIN;  Surgeon: Glenna Fellows, MD;  Location: [facility];  Service: Plastics;  Laterality: Right;   BREAST RECONSTRUCTION WITH PLACEMENT OF TISSUE EXPANDER AND ALLODERM Left 03/15/2022   Procedure: LEFT BREAST RECONSTRUCTION WITH PLACEMENT OF TISSUE EXPANDER AND ALLODERM;  Surgeon: Glenna Fellows, MD;  Location: [facility];  Service: Plastics;  Laterality: Left;   BREAST REDUCTION SURGERY Left 03/07/2017   Procedure: LEFT BREAST REDUCTION FOR SYMMETRY;  Surgeon: Glenna Fellows, MD;  Location: [facility];  Service: Plastics;  Laterality: Left;   COLONOSCOPY W/ POLYPECTOMY  2010   Dr Loreta Ave   DILATION AND CURETTAGE OF UTERUS  1990s   HARDWARE REMOVAL Left 10/31/2019   Procedure: LEFT LEG MEDIAL HARDWARE REMOVAL;  Surgeon: Terance Hart, MD;  Location: [facility];  Service: Orthopedics;  Laterality: Left;  PROCEDURE: LEFT LEG MEDIAL HARDWARE REMOVAL LENGTH OF SURGERY: 1 HOUR   JOINT REPLACEMENT     LATISSIMUS FLAP TO BREAST Right 10/28/2016   Procedure: LATISSIMUS FLAP TO BREAST WITH PLACEMENT OF TISSUE EXPANDER ON THE  RIGHT;  Surgeon: Glenna Fellows, MD;  [facility]: [facility];  Service: Plastics;  Laterality: Right;  RFNA   MASTECTOMY Right 10/28/2016   RECONSTRUCTION BREAST W/ LATISSIMUS DORSI FLAP Right 10/28/2016    WITH PLACEMENT OF TISSUE EXPANDER    REMOVAL OF BILATERAL TISSUE EXPANDERS WITH PLACEMENT OF BILATERAL BREAST IMPLANTS Right 03/07/2017   Procedure: REMOVAL OF RIGHT BREAST TISSUE EXPANDER WITH PLACEMENT OF RIGHT BREAST IMPLANT;  Surgeon: Glenna Fellows, MD;  Location: [facility];  Service:  Plastics;  Laterality: Right;   REMOVAL OF TISSUE EXPANDER AND PLACEMENT OF IMPLANT Left 06/03/2022   Procedure: REMOVAL OF TISSUE EXPANDER AND PLACEMENT OF IMPLANT;  Surgeon: Glenna Fellows, MD;  Location: [facility];  Service: Plastics;  Laterality: Left;   SIMPLE MASTECTOMY WITH AXILLARY SENTINEL NODE BIOPSY Left 03/15/2022   Procedure: LEFT MASTECTOMY WITH INJECTION OF MAGTRACE;  Surgeon: Abigail Miyamoto, MD;  Location: [facility];  Service: General;  Laterality: Left;  LMA   TIBIA IM NAIL INSERTION Left 03/09/2019   Procedure: INTRAMEDULLARY (IM) NAIL TIBIAL;  Surgeon: Terance Hart, MD;  Location: [facility];  Service: Orthopedics;  Laterality: Left;   TISSUE EXPANDER PLACEMENT Right 10/28/2016   Procedure: TISSUE EXPANDER;  Surgeon: Glenna Fellows, MD;  [facility]: [facility];  Service: Plastics;  Laterality: Right;   TOTAL HIP ARTHROPLASTY Right 05/12/2015   Procedure: RIGHT TOTAL HIP ARTHROPLASTY ANTERIOR APPROACH;  Surgeon: Durene Romans, MD;  Location: [facility];  Service: Orthopedics;  Laterality: Right;   TOTAL HIP ARTHROPLASTY Left 06/23/2015   Procedure: LEFT TOTAL HIP ARTHROPLASTY ANTERIOR APPROACH;  Surgeon: Durene Romans, MD;  Location: [facility];  Service: Orthopedics;  Laterality: Left;   TOTAL MASTECTOMY Right 10/28/2016   Procedure: RIGHT MASTECTOMY;  Surgeon: Ovidio Kin, MD;  Location: [facility];  Service: General;  Laterality: Right;    Medications  Current Outpatient Medications on File Prior to Visit  Medication Sig Dispense Refill   amLODipine (NORVASC) 5 MG tablet Take 1 tablet (5 mg total) by mouth daily. 90 tablet 1   amphetamine-dextroamphetamine (ADDERALL) 20 MG tablet Take 20 mg by mouth daily.  0   atorvastatin (LIPITOR) 40 MG tablet Take 1 tablet (40 mg total) by mouth daily. 90 tablet 2   carvedilol (COREG) 25 MG tablet TAKE 1 TABLET BY MOUTH TWICE A DAY WITH A MEAL 60 tablet 2   glucose blood (ONETOUCH VERIO) test strip Use once daily to  check blood sugar.  DX E11.9 100 each 1   hydrOXYzine (ATARAX) 25 MG tablet Take 0.5-2 tablets (12.5-50 mg total) by mouth 3 (three) times daily as needed for anxiety. 90 tablet 2   losartan-hydrochlorothiazide (HYZAAR) 100-25 MG tablet TAKE 1 TABLET BY MOUTH DAILY 30 tablet 2   meloxicam (MOBIC) 15 MG tablet Take 1 tablet (15 mg total) by mouth daily as needed for pain. 30 tablet 0   omeprazole (PRILOSEC) 40 MG capsule TAKE 1 CAPSULE BY MOUTH DAILY 30 capsule 1   ONETOUCH DELICA LANCETS FINE MISC Use as directed once daily to check blood sugar.  DXE11.9 100 each 1   oxyCODONE (ROXICODONE) 5 MG immediate release tablet Take 1 tablet (5 mg total) by mouth every 4 (four) hours as needed. 20 tablet 0   Semaglutide, 2 MG/DOSE, (OZEMPIC, 2 MG/DOSE,) 8 MG/3ML SOPN DIAL AND INJECT UNDER THE SKIN 2 MG WEEKLY 9 mL 3   traZODone (DESYREL) 50 MG tablet TAKE 1/2 TO 1 TABLET AT BEDTIME AS NEEDED FOR SLEEP 90 tablet  2   venlafaxine XR (EFFEXOR-XR) 75 MG 24 hr capsule TAKE 1 CAPSULE BY MOUTH DAILY WITH BREAKFAST 30 capsule 1   Allergies Allergies  Allergen Reactions   Prednisone Other (See Comments)    headaches   Review of Systems: Constitutional:  no unexpected weight changes Eye:  no recent significant change in vision Ear/Nose/Mouth/Throat:  Ears:  no recent change in hearing Nose/Mouth/Throat:  no complaints of nasal congestion, no sore throat Cardiovascular: no chest pain Respiratory:  no shortness of breath Gastrointestinal:  no abdominal pain, no change in bowel habits GU:  Female: negative for dysuria or pelvic pain Musculoskeletal/Extremities:  no pain of the joints Integumentary (Skin/Breast):  no abnormal skin lesions reported Neurologic:  +balance issues Endocrine:  denies fatigue  Exam BP 130/82 (Patient Position: Sitting, Cuff Size: Normal)   Pulse 75   Temp 98.6 F (37 C) (Oral)   Resp 20   Ht 5\' 5"  (1.651 m)   Wt 186 lb (84.4 kg)   SpO2 94%   BMI 30.95 kg/m  General:  well  developed, well nourished, in no apparent distress Skin:  no significant moles, warts, or growths Head:  no masses, lesions, or tenderness Eyes:  pupils equal and round, sclera anicteric without injection Ears:  canals without lesions, TMs shiny without retraction, no obvious effusion, no erythema Nose:  nares patent, mucosa normal, and no drainage  Throat/Pharynx:  lips and gingiva without lesion; tongue and uvula midline; non-inflamed pharynx; no exudates or postnasal drainage Neck: neck supple without adenopathy, thyromegaly, or masses Lungs:  clear to auscultation, breath sounds equal bilaterally, no respiratory distress Cardio:  regular rate and rhythm, no LE edema, DP pulses 2+ b/l Abdomen:  abdomen soft, nontender; bowel sounds normal; no masses or organomegaly Genital: Defer to GYN Musculoskeletal:  symmetrical muscle groups noted without atrophy or deformity Extremities:  no clubbing, cyanosis, or edema, no deformities, no skin discoloration Neuro:  gait cautious; deep tendon reflexes normal and symmetric; no cerebellar signs; sensation intact to pinprick over b/l feet Psych: well oriented with normal range of affect and appropriate judgment/insight  Assessment and Plan  Well adult exam - Plan: CBC, Comprehensive metabolic panel, Lipid panel  Type 2 diabetes mellitus with obesity (HCC) - Plan: Hemoglobin A1c, Microalbumin / creatinine urine ratio  Balance problem - Plan: Ambulatory referral to Physical Therapy   Well 62 y.o. female. Counseled on diet and exercise. Advanced directive form provided today.  Other orders as above. Balance issues: Refer to PT. Add wt resistance exercise to regimen.  Follow up in 6 mo. The patient voiced understanding and agreement to the plan.  Jilda Roche Paxton, DO 02/22/23 9:48 AM

## 2023-02-22 NOTE — Patient Instructions (Addendum)
Give [place] 2-3 business days to get the results of your labs back.   Keep the diet clean and stay active.  Please consider adding some weight resistance exercise to your routine. Consider yoga as well.   Please get me a copy of your advanced directive form at your convenience.   If you do not hear anything about your referral in the next 1-2 weeks, call our office and ask for an update.  Let us know if you need anything.

## 2023-02-24 ENCOUNTER — Other Ambulatory Visit: Payer: 59

## 2023-03-03 ENCOUNTER — Telehealth: Payer: Self-pay | Admitting: Family Medicine

## 2023-03-03 ENCOUNTER — Other Ambulatory Visit (INDEPENDENT_AMBULATORY_CARE_PROVIDER_SITE_OTHER): Payer: 59

## 2023-03-03 DIAGNOSIS — E876 Hypokalemia: Secondary | ICD-10-CM | POA: Diagnosis not present

## 2023-03-03 LAB — BASIC METABOLIC PANEL
BUN: 13 mg/dL (ref 6–23)
CO2: 33 mEq/L — ABNORMAL HIGH (ref 19–32)
Calcium: 9.7 mg/dL (ref 8.4–10.5)
Chloride: 98 mEq/L (ref 96–112)
Creatinine, Ser: 0.81 mg/dL (ref 0.40–1.20)
GFR: 77.47 mL/min (ref >60.00)
Glucose, Bld: 87 mg/dL (ref 70–99)
Potassium: 3.7 mEq/L (ref 3.5–5.1)
Sodium: 139 mEq/L (ref 135–145)

## 2023-03-03 LAB — MAGNESIUM: Magnesium: 2 mg/dL (ref 1.5–2.5)

## 2023-03-03 NOTE — Telephone Encounter (Signed)
Pt wanted to check on the status of the physicians form and medical accomodation form she sent in via fax . Pls call pt and advise on status.

## 2023-03-03 NOTE — Therapy (Signed)
OUTPATIENT PHYSICAL THERAPY NEURO EVALUATION   Patient Name: Madeline Chandler MRN: [id number] DOB:09/29/1960, 62 y.o., female Today's Date: 03/06/2023   PCP: Arva Chafe  REFERRING PROVIDER: Arva Chafe   END OF SESSION:  PT End of Session - 03/06/23 0854     Visit Number 1    Date for PT Re-Evaluation 05/08/23    Authorization Type [employer]    PT Start Time [id number]    PT Stop Time 0930    PT Time Calculation (min) 35 min    Activity Tolerance Patient tolerated treatment well    Behavior During Therapy [facility] for tasks assessed/performed             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   Bulging lumbar disc    2   Cervical pain (neck)    De Quervain's tenosynovitis, bilateral    Ductal carcinoma in situ (DCIS) of right breast 1997; 2018   s/p chemotherapy and radiation treatment   Essential hypertension    08/22/11: BUN 15, creatinine 0.95, GFR 79      Generalized anxiety disorder    GERD (gastroesophageal reflux disease)    History of colonic polyps    History of migraine headaches    "none in the last few years; had them mostly in late 20's/mid 30's"   History of therapeutic radiation    Hypokalemia    Insomnia    Major depressive disorder    Mild neurocognitive disorder, unclear etiology 04/24/2020   OSA (obstructive sleep apnea) 02/19/2018   Awaiting CPAP machine as of 04/24/2020   Restless leg syndrome    Type 2 diabetes mellitus    Upper respiratory infection 07/16/2010   Past Surgical History:  Procedure Laterality Date   ABDOMINAL HYSTERECTOMY  2000s   ANTERIOR CERVICAL DECOMP/DISCECTOMY FUSION N/A 12/02/2015   Procedure: ANTERIOR CERVICAL DECOMPRESSION/DISCECTOMY FUSION C4 - C7  3 LEVELS;  Surgeon: Venita Lick, MD;  Location: [facility];  Service: Orthopedics;  Laterality: N/A;   BACK  SURGERY     BREAST BIOPSY Right 1997; 08/2016   BREAST LUMPECTOMY Right 1997   BREAST RECONSTRUCTION Right 07/07/2017   Procedure: RIGHT NIPPLE AEROLA RECONSTRUCTION WITH LOCAL FLAP FROM RIGHT GROIN;  Surgeon: Glenna Fellows, MD;  Location: [facility];  Service: Plastics;  Laterality: Right;   BREAST RECONSTRUCTION WITH PLACEMENT OF TISSUE EXPANDER AND ALLODERM Left 03/15/2022   Procedure: LEFT BREAST RECONSTRUCTION WITH PLACEMENT OF TISSUE EXPANDER AND ALLODERM;  Surgeon: Glenna Fellows, MD;  Location: [facility];  Service: Plastics;  Laterality: Left;   BREAST REDUCTION SURGERY Left 03/07/2017   Procedure: LEFT BREAST REDUCTION FOR SYMMETRY;  Surgeon: Glenna Fellows, MD;  Location: [facility];  Service: Plastics;  Laterality: Left;   COLONOSCOPY W/ POLYPECTOMY  2010   Dr Loreta Ave   DILATION AND CURETTAGE OF UTERUS  1990s   HARDWARE REMOVAL Left 10/31/2019   Procedure: LEFT LEG MEDIAL HARDWARE REMOVAL;  Surgeon: Terance Hart, MD;  Location: [facility];  Service: Orthopedics;  Laterality: Left;  PROCEDURE: LEFT LEG MEDIAL HARDWARE REMOVAL LENGTH OF SURGERY: 1 HOUR   JOINT REPLACEMENT     LATISSIMUS FLAP TO BREAST Right 10/28/2016   Procedure: LATISSIMUS FLAP TO BREAST WITH PLACEMENT OF TISSUE EXPANDER ON THE RIGHT;  Surgeon: Glenna Fellows, MD;  Location: [facility];  Service: Plastics;  Laterality: Right;  RFNA   MASTECTOMY Right 10/28/2016   RECONSTRUCTION BREAST W/ LATISSIMUS DORSI FLAP Right 10/28/2016    WITH PLACEMENT OF TISSUE EXPANDER    REMOVAL OF BILATERAL TISSUE EXPANDERS WITH PLACEMENT OF BILATERAL BREAST IMPLANTS Right 03/07/2017   Procedure: REMOVAL OF RIGHT BREAST TISSUE EXPANDER WITH PLACEMENT OF RIGHT BREAST IMPLANT;  Surgeon: Glenna Fellows, MD;  Location: [facility];  Service: Plastics;  Laterality: Right;   REMOVAL OF TISSUE EXPANDER AND PLACEMENT OF IMPLANT Left 06/03/2022   Procedure:  REMOVAL OF TISSUE EXPANDER AND PLACEMENT OF IMPLANT;  Surgeon: Glenna Fellows, MD;  Location: [facility];  Service: Plastics;  Laterality: Left;   SIMPLE MASTECTOMY WITH AXILLARY SENTINEL NODE BIOPSY Left 03/15/2022   Procedure: LEFT MASTECTOMY WITH INJECTION OF MAGTRACE;  Surgeon: Abigail Miyamoto, MD;  Location: [facility];  Service: General;  Laterality: Left;  LMA   TIBIA IM NAIL INSERTION Left 03/09/2019   Procedure: INTRAMEDULLARY (IM) NAIL TIBIAL;  Surgeon: Terance Hart, MD;  Location: [facility] OR;  Service: Orthopedics;  Laterality: Left;   TISSUE EXPANDER PLACEMENT Right 10/28/2016   Procedure: TISSUE EXPANDER;  Surgeon: Glenna Fellows, MD;  Location: [facility];  Service: Plastics;  Laterality: Right;   TOTAL HIP ARTHROPLASTY Right 05/12/2015   Procedure: RIGHT TOTAL HIP ARTHROPLASTY ANTERIOR APPROACH;  Surgeon: Durene Romans, MD;  Location: [facility];  Service: Orthopedics;  Laterality: Right;   TOTAL HIP ARTHROPLASTY Left 06/23/2015   Procedure: LEFT TOTAL HIP ARTHROPLASTY ANTERIOR APPROACH;  Surgeon: Durene Romans, MD;  Location: [facility];  Service: Orthopedics;  Laterality: Left;   TOTAL MASTECTOMY Right 10/28/2016   Procedure: RIGHT MASTECTOMY;  Surgeon: Ovidio Kin, MD;  Location: [facility];  Service: General;  Laterality: Right;   Patient Active Problem List   Diagnosis Date Noted   Ductal carcinoma in situ (DCIS) of left breast 03/15/2022   Moderate episode of recurrent major depressive disorder (HCC) 01/18/2021   History of therapeutic radiation 04/24/2020   Mild neurocognitive disorder, unclear etiology 04/24/2020   Insomnia 02/04/2020   Soft tissue mass 08/06/2019   Syncope 03/09/2019   Hypokalemia 03/09/2019   Greater trochanteric bursitis of left hip 08/09/2018   Left wrist pain 06/18/2018   OSA (obstructive sleep apnea) 02/19/2018   Chronic cough 02/19/2018   Type 2 diabetes mellitus with obesity (HCC) 01/26/2018   De Quervain's tenosynovitis,  bilateral 11/16/2017   Ductal carcinoma in situ (DCIS) of right breast 09/16/2016   Neck pain 12/02/2015   Preoperative clearance 11/10/2015   Peripheral edema 10/05/2015   Cervical pain (neck) 10/05/2015   S/P left THA, AA 06/23/2015   Obesity (BMI 30-39.9) 05/13/2015   Muscle spasm 02/27/2015   Pain in joint, shoulder region 09/05/2014   Leg swelling 12/08/2013   Other abnormal glucose 02/23/2012   Nonspecific abnormal electrocardiogram (ECG) (EKG) 02/23/2012   GERD (gastroesophageal reflux disease) 10/23/2009   Anxiety state 09/02/2009   Major depressive disorder 07/10/2008   History of breast cancer 07/10/2008   History of colonic polyps 07/10/2008   Fibroids, uterus 02/15/2008   History of dilation and curettage 02/15/2008   Essential hypertension 09/25/2007    ONSET DATE: 02/22/23  REFERRING DIAG: R26.89 balance problem   THERAPY DIAG:  Other lack of coordination  Other abnormalities of gait and mobility  Balance disorder  Rationale for Evaluation and Treatment: Rehabilitation  SUBJECTIVE:  SUBJECTIVE STATEMENT: I don't have no balance, I can be walking and I stumble especially when making turns. The balance has been on and off for a while.   Pt accompanied by: self  PERTINENT HISTORY: R and L THA 2016  PAIN:  Are you having pain? No  PRECAUTIONS: None  RED FLAGS: None   WEIGHT BEARING RESTRICTIONS: No  FALLS: Has patient fallen in last 6 months? Yes. Number of falls 2-3  LIVING ENVIRONMENT: Lives with: lives alone Lives in: House/apartment Stairs: No  PLOF: Independent  PATIENT GOALS: work on my balance   OBJECTIVE:   DIAGNOSTIC FINDINGS: None  COGNITION: Overall cognitive status: Within functional limits for tasks assessed   SENSATION: WFL   POSTURE:  No Significant postural limitations  LOWER EXTREMITY ROM:   grossly WFL   LOWER EXTREMITY MMT:  grossly 4+/5   STAIRS: Level of Assistance: Complete Independence Stair Negotiation Technique: Alternating Pattern  with Single Rail on Left   FUNCTIONAL TESTS:  5 times sit to stand: 12.06s  Berg Balance Scale: 50/56 Functional gait assessment: 22/30   TODAY'S TREATMENT:                                                                                                                              DATE: EVAL 03/06/23    PATIENT EDUCATION: Education details: POC and HEP Person educated: Patient Education method: Explanation Education comprehension: verbalized understanding  HOME EXERCISE PROGRAM: Access Code: [email or web address] URL: [email or web address] Date: 03/06/2023 Prepared by: Cassie Freer  Exercises - Tandem Stance  - 1 x daily - 7 x weekly - 30 hold - Single Leg Stance  - 1 x daily - 7 x weekly - 10 hold - Heel Raises with Counter Support  - 1 x daily - 7 x weekly - 2 sets - 10 reps - Standing Hip Abduction with Counter Support  - 1 x daily - 7 x weekly - 2 sets - 10 reps - Sit to Stand  - 1 x daily - 7 x weekly - 2 sets - 10 reps  GOALS: Goals reviewed with patient? Yes  SHORT TERM GOALS: Target date: 04/03/23  Patient will be independent with initial HEP. Goal status: INITIAL  2.  Patient will be educated on strategies to decrease risk of falls.  Baseline: educated to remove loose rugs to decrease risk for falls Goal status: INITIAL   LONG TERM GOALS: Target date: 05/08/23  Patient will be independent with advanced/ongoing HEP to improve outcomes and carryover.  Goal status: INITIAL  2.  Patient will demonstrate SLS on foam surface for >10s bilaterally  Baseline: 4s at best Goal status: INITIAL  3.  Patient will be able to make quick turns without staggering   Baseline: loss of balance with turns Goal status: INITIAL   4.  Patient will demonstrate  tandem stance on foam for >30s bilaterally Baseline: 10-15s Goal status: INITIAL  5.  Patient will demonstrate at least 25/30  on FGA to improve gait stability and reduce risk for falls. Baseline: 22/30 Goal status: INITIAL   ASSESSMENT:  CLINICAL IMPRESSION: Patient is a 62 y.o. female who was seen today for physical therapy evaluation and treatment for balance problems. Patient reports she feels that she is off balance and often staggers when she walks. She reports when she makes turns she really feels that she has to catch herself to avoid falls. With balance assessments done today she presents as low falls risk according to the BERG and FGA. Patient will benefit from skilled PT to improve her functional balance to be able to make her feels less off balance, be able to walk without staggering, and decrease her risk for falls.   OBJECTIVE IMPAIRMENTS: decreased balance.   REHAB POTENTIAL: Good  CLINICAL DECISION MAKING: Stable/uncomplicated  EVALUATION COMPLEXITY: Low  PLAN:  PT FREQUENCY: 1x/week  PT DURATION: 10 weeks  PLANNED INTERVENTIONS: Therapeutic exercises, Therapeutic activity, Neuromuscular re-education, Balance training, Gait training, Patient/Family education, Self Care, Joint mobilization, Stair training, and Manual therapy  PLAN FOR NEXT SESSION: balance training    Cassie Freer, PT 03/06/2023, 9:59 AM

## 2023-03-06 ENCOUNTER — Ambulatory Visit: Payer: 59 | Attending: Family Medicine

## 2023-03-06 DIAGNOSIS — R278 Other lack of coordination: Secondary | ICD-10-CM | POA: Insufficient documentation

## 2023-03-06 DIAGNOSIS — R2689 Other abnormalities of gait and mobility: Secondary | ICD-10-CM | POA: Insufficient documentation

## 2023-03-07 NOTE — Telephone Encounter (Signed)
Spoke with pt and she stated that she faxed this in after her appointment.  Madeline Chandler not sure if you ever saw anything.  I advised her that I will check with you first before asking her to resend in.

## 2023-03-16 NOTE — Therapy (Signed)
OUTPATIENT PHYSICAL THERAPY NEURO TREATMENT   Patient Name: Madeline Chandler MRN: [id number] DOB:04/09/1961, 62 y.o., female Today's Date: 03/17/2023   PCP: Arva Chafe  REFERRING PROVIDER: Arva Chafe   END OF SESSION:  PT End of Session - 03/17/23 0859     Visit Number 2    Date for PT Re-Evaluation 05/08/23    Authorization Type UHC    PT Start Time [phone]    PT Stop Time 0930    PT Time Calculation (min) 32 min    Activity Tolerance Patient tolerated treatment well    Behavior During Therapy [facility] for tasks assessed/performed              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   Bulging lumbar disc    2   Cervical pain (neck)    De Quervain's tenosynovitis, bilateral    Ductal carcinoma in situ (DCIS) of right breast 1997; 2018   s/p chemotherapy and radiation treatment   Essential hypertension    08/22/11: BUN 15, creatinine 0.95, GFR 79      Generalized anxiety disorder    GERD (gastroesophageal reflux disease)    History of colonic polyps    History of migraine headaches    "none in the last few years; had them mostly in late 20's/mid 30's"   History of therapeutic radiation    Hypokalemia    Insomnia    Major depressive disorder    Mild neurocognitive disorder, unclear etiology 04/24/2020   OSA (obstructive sleep apnea) 02/19/2018   Awaiting CPAP machine as of 04/24/2020   Restless leg syndrome    Type 2 diabetes mellitus    Upper respiratory infection 07/16/2010   Past Surgical History:  Procedure Laterality Date   ABDOMINAL HYSTERECTOMY  2000s   ANTERIOR CERVICAL DECOMP/DISCECTOMY FUSION N/A 12/02/2015   Procedure: ANTERIOR CERVICAL DECOMPRESSION/DISCECTOMY FUSION C4 - C7  3 LEVELS;  Surgeon: Venita Lick, MD;  [facility];  Service: Orthopedics;  Laterality: N/A;   BACK  SURGERY     BREAST BIOPSY Right 1997; 08/2016   BREAST LUMPECTOMY Right 1997   BREAST RECONSTRUCTION Right 07/07/2017   Procedure: RIGHT NIPPLE AEROLA RECONSTRUCTION WITH LOCAL FLAP FROM RIGHT GROIN;  Surgeon: Glenna Fellows, MD;  Location: [facility];  Service: Plastics;  Laterality: Right;   BREAST RECONSTRUCTION WITH PLACEMENT OF TISSUE EXPANDER AND ALLODERM Left 03/15/2022   Procedure: LEFT BREAST RECONSTRUCTION WITH PLACEMENT OF TISSUE EXPANDER AND ALLODERM;  Surgeon: Glenna Fellows, MD;  Location: [facility];  Service: Plastics;  Laterality: Left;   BREAST REDUCTION SURGERY Left 03/07/2017   Procedure: LEFT BREAST REDUCTION FOR SYMMETRY;  Surgeon: Glenna Fellows, MD;  Location: [facility];  Service: Plastics;  Laterality: Left;   COLONOSCOPY W/ POLYPECTOMY  2010   Dr Loreta Ave   DILATION AND CURETTAGE OF UTERUS  1990s   HARDWARE REMOVAL Left 10/31/2019   Procedure: LEFT LEG MEDIAL HARDWARE REMOVAL;  Surgeon: Terance Hart, MD;  Location: [facility];  Service: Orthopedics;  Laterality: Left;  PROCEDURE: LEFT LEG MEDIAL HARDWARE REMOVAL LENGTH OF SURGERY: 1 HOUR   JOINT REPLACEMENT     LATISSIMUS FLAP TO BREAST Right 10/28/2016   Procedure: LATISSIMUS FLAP TO BREAST WITH PLACEMENT OF TISSUE EXPANDER ON THE  RIGHT;  Surgeon: Glenna Fellows, MD;  [facility];  Service: Plastics;  Laterality: Right;  RFNA   MASTECTOMY Right 10/28/2016   RECONSTRUCTION BREAST W/ LATISSIMUS DORSI FLAP Right 10/28/2016    WITH PLACEMENT OF TISSUE EXPANDER    REMOVAL OF BILATERAL TISSUE EXPANDERS WITH PLACEMENT OF BILATERAL BREAST IMPLANTS Right 03/07/2017   Procedure: REMOVAL OF RIGHT BREAST TISSUE EXPANDER WITH PLACEMENT OF RIGHT BREAST IMPLANT;  Surgeon: Glenna Fellows, MD;  Location: [facility];  Service: Plastics;  Laterality: Right;   REMOVAL OF TISSUE EXPANDER AND PLACEMENT OF IMPLANT Left 06/03/2022   Procedure:  REMOVAL OF TISSUE EXPANDER AND PLACEMENT OF IMPLANT;  Surgeon: Glenna Fellows, MD;  Location: [facility];  Service: Plastics;  Laterality: Left;   SIMPLE MASTECTOMY WITH AXILLARY SENTINEL NODE BIOPSY Left 03/15/2022   Procedure: LEFT MASTECTOMY WITH INJECTION OF MAGTRACE;  Surgeon: Abigail Miyamoto, MD;  Location: [facility];  Service: General;  Laterality: Left;  LMA   TIBIA IM NAIL INSERTION Left 03/09/2019   Procedure: INTRAMEDULLARY (IM) NAIL TIBIAL;  Surgeon: Terance Hart, MD;  [facility];  Service: Orthopedics;  Laterality: Left;   TISSUE EXPANDER PLACEMENT Right 10/28/2016   Procedure: TISSUE EXPANDER;  Surgeon: Glenna Fellows, MD;  [facility];  Service: Plastics;  Laterality: Right;   TOTAL HIP ARTHROPLASTY Right 05/12/2015   Procedure: RIGHT TOTAL HIP ARTHROPLASTY ANTERIOR APPROACH;  Surgeon: Durene Romans, MD;  Location: [facility];  Service: Orthopedics;  Laterality: Right;   TOTAL HIP ARTHROPLASTY Left 06/23/2015   Procedure: LEFT TOTAL HIP ARTHROPLASTY ANTERIOR APPROACH;  Surgeon: Durene Romans, MD;  Location: [facility];  Service: Orthopedics;  Laterality: Left;   TOTAL MASTECTOMY Right 10/28/2016   Procedure: RIGHT MASTECTOMY;  Surgeon: Ovidio Kin, MD;  Location: [facility] OR;  Service: General;  Laterality: Right;   Patient Active Problem List   Diagnosis Date Noted   Ductal carcinoma in situ (DCIS) of left breast 03/15/2022   Moderate episode of recurrent major depressive disorder (HCC) 01/18/2021   History of therapeutic radiation 04/24/2020   Mild neurocognitive disorder, unclear etiology 04/24/2020   Insomnia 02/04/2020   Soft tissue mass 08/06/2019   Syncope 03/09/2019   Hypokalemia 03/09/2019   Greater trochanteric bursitis of left hip 08/09/2018   Left wrist pain 06/18/2018   OSA (obstructive sleep apnea) 02/19/2018   Chronic cough 02/19/2018   Type 2 diabetes mellitus with obesity (HCC) 01/26/2018   De Quervain's tenosynovitis,  bilateral 11/16/2017   Ductal carcinoma in situ (DCIS) of right breast 09/16/2016   Neck pain 12/02/2015   Preoperative clearance 11/10/2015   Peripheral edema 10/05/2015   Cervical pain (neck) 10/05/2015   S/P left THA, AA 06/23/2015   Obesity (BMI 30-39.9) 05/13/2015   Muscle spasm 02/27/2015   Pain in joint, shoulder region 09/05/2014   Leg swelling 12/08/2013   Other abnormal glucose 02/23/2012   Nonspecific abnormal electrocardiogram (ECG) (EKG) 02/23/2012   GERD (gastroesophageal reflux disease) 10/23/2009   Anxiety state 09/02/2009   Major depressive disorder 07/10/2008   History of breast cancer 07/10/2008   History of colonic polyps 07/10/2008   Fibroids, uterus 02/15/2008   History of dilation and curettage 02/15/2008   Essential hypertension 09/25/2007    ONSET DATE: 02/22/23  REFERRING DIAG: R26.89 balance problem   THERAPY DIAG:  Other lack of coordination  Other abnormalities of gait and mobility  Balance disorder  Rationale for Evaluation and Treatment: Rehabilitation  SUBJECTIVE:  SUBJECTIVE STATEMENT: I am unsteady this morning. Having issues with sinuses, have a sinus headache. Anxiety has been bad this week mostly due to the weather.   Pt accompanied by: self  PERTINENT HISTORY: R and L THA 2016  PAIN:  Are you having pain? No  PRECAUTIONS: None  RED FLAGS: None   WEIGHT BEARING RESTRICTIONS: No  FALLS: Has patient fallen in last 6 months? Yes. Number of falls 2-3  LIVING ENVIRONMENT: Lives with: lives alone Lives in: House/apartment Stairs: No  PLOF: Independent  PATIENT GOALS: work on my balance   OBJECTIVE:   DIAGNOSTIC FINDINGS: None  COGNITION: Overall cognitive status: Within functional limits for tasks  assessed   SENSATION: WFL   POSTURE: No Significant postural limitations  LOWER EXTREMITY ROM:   grossly WFL   LOWER EXTREMITY MMT:  grossly 4+/5   STAIRS: Level of Assistance: Complete Independence Stair Negotiation Technique: Alternating Pattern  with Single Rail on Left   FUNCTIONAL TESTS:  5 times sit to stand: 12.06s  Berg Balance Scale: 50/56 Functional gait assessment: 22/30   TODAY'S TREATMENT:                                                                                                                              DATE:  03/16/23 NuStep L5 x73mins  Step ups 6"  On airex EO, EC, then with feet together 30s  Cone taps on airex  Catch on airex  Walking on beam  Tandem on beam 10s SLS on beam 5s  Calf raises 2x10 Stepping over obstacles   EVAL 03/06/23    PATIENT EDUCATION: Education details: POC and HEP Person educated: Patient Education method: Explanation Education comprehension: verbalized understanding  HOME EXERCISE PROGRAM: Access Code: [email or web address] URL: [email or web address] Date: 03/06/2023 Prepared by: Cassie Freer  Exercises - Tandem Stance  - 1 x daily - 7 x weekly - 30 hold - Single Leg Stance  - 1 x daily - 7 x weekly - 10 hold - Heel Raises with Counter Support  - 1 x daily - 7 x weekly - 2 sets - 10 reps - Standing Hip Abduction with Counter Support  - 1 x daily - 7 x weekly - 2 sets - 10 reps - Sit to Stand  - 1 x daily - 7 x weekly - 2 sets - 10 reps  GOALS: Goals reviewed with patient? Yes  SHORT TERM GOALS: Target date: 04/03/23  Patient will be independent with initial HEP. Goal status: INITIAL  2.  Patient will be educated on strategies to decrease risk of falls.  Baseline: educated to remove loose rugs to decrease risk for falls Goal status: INITIAL   LONG TERM GOALS: Target date: 05/08/23  Patient will be independent with advanced/ongoing HEP to improve outcomes and carryover.  Goal status: INITIAL  2.   Patient will demonstrate SLS on foam surface for >10s bilaterally  Baseline: 4s at best Goal status: INITIAL  3.  Patient  will be able to make quick turns without staggering   Baseline: loss of balance with turns Goal status: INITIAL   4.  Patient will demonstrate tandem stance on foam for >30s bilaterally Baseline: 10-15s Goal status: INITIAL  5.  Patient will demonstrate at least 25/30 on FGA to improve gait stability and reduce risk for falls. Baseline: 22/30 Goal status: INITIAL   ASSESSMENT:  CLINICAL IMPRESSION: Patient is a 62 y.o. female who was seen today for physical therapy treatment for balance problems. She arrives late to appointment today. We focused on mostly her balance, using airex pad and beam. She has difficulty walking on the beam, unable to do without needed to hold on.    OBJECTIVE IMPAIRMENTS: decreased balance.   REHAB POTENTIAL: Good  CLINICAL DECISION MAKING: Stable/uncomplicated  EVALUATION COMPLEXITY: Low  PLAN:  PT FREQUENCY: 1x/week  PT DURATION: 10 weeks  PLANNED INTERVENTIONS: Therapeutic exercises, Therapeutic activity, Neuromuscular re-education, Balance training, Gait training, Patient/Family education, Self Care, Joint mobilization, Stair training, and Manual therapy  PLAN FOR NEXT SESSION: balance training, practice more tandem and SLS      , PT 03/17/2023, 9:30 AM

## 2023-03-17 ENCOUNTER — Ambulatory Visit: Payer: 59 | Attending: Family Medicine

## 2023-03-17 DIAGNOSIS — R278 Other lack of coordination: Secondary | ICD-10-CM | POA: Insufficient documentation

## 2023-03-17 DIAGNOSIS — R2689 Other abnormalities of gait and mobility: Secondary | ICD-10-CM | POA: Insufficient documentation

## 2023-03-23 NOTE — Therapy (Signed)
OUTPATIENT PHYSICAL THERAPY NEURO TREATMENT   Patient Name: Madeline Chandler MRN: [id number] DOB:1960-08-28, 62 y.o., female Today's Date: 03/24/2023   PCP: Arva Chafe  REFERRING PROVIDER: Arva Chafe   END OF SESSION:  PT End of Session - 03/24/23 0849     Visit Number 3    Date for PT Re-Evaluation 05/08/23    Authorization Type UHC    PT Start Time [phone]    PT Stop Time 0930    PT Time Calculation (min) 41 min    Activity Tolerance Patient tolerated treatment well    Behavior During Therapy [facility] for tasks assessed/performed               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   Bulging lumbar disc    2   Cervical pain (neck)    De Quervain's tenosynovitis, bilateral    Ductal carcinoma in situ (DCIS) of right breast 1997; 2018   s/p chemotherapy and radiation treatment   Essential hypertension    08/22/11: BUN 15, creatinine 0.95, GFR 79      Generalized anxiety disorder    GERD (gastroesophageal reflux disease)    History of colonic polyps    History of migraine headaches    "none in the last few years; had them mostly in late 20's/mid 30's"   History of therapeutic radiation    Hypokalemia    Insomnia    Major depressive disorder    Mild neurocognitive disorder, unclear etiology 04/24/2020   OSA (obstructive sleep apnea) 02/19/2018   Awaiting CPAP machine as of 04/24/2020   Restless leg syndrome    Type 2 diabetes mellitus    Upper respiratory infection 07/16/2010   Past Surgical History:  Procedure Laterality Date   ABDOMINAL HYSTERECTOMY  2000s   ANTERIOR CERVICAL DECOMP/DISCECTOMY FUSION N/A 12/02/2015   Procedure: ANTERIOR CERVICAL DECOMPRESSION/DISCECTOMY FUSION C4 - C7  3 LEVELS;  Surgeon: Venita Lick, MD;  Location: [facility];  Service: Orthopedics;  Laterality: N/A;   BACK  SURGERY     BREAST BIOPSY Right 1997; 08/2016   BREAST LUMPECTOMY Right 1997   BREAST RECONSTRUCTION Right 07/07/2017   Procedure: RIGHT NIPPLE AEROLA RECONSTRUCTION WITH LOCAL FLAP FROM RIGHT GROIN;  Surgeon: Glenna Fellows, MD;  Location: [facility];  Service: Plastics;  Laterality: Right;   BREAST RECONSTRUCTION WITH PLACEMENT OF TISSUE EXPANDER AND ALLODERM Left 03/15/2022   Procedure: LEFT BREAST RECONSTRUCTION WITH PLACEMENT OF TISSUE EXPANDER AND ALLODERM;  Surgeon: Glenna Fellows, MD;  Location: [facility];  Service: Plastics;  Laterality: Left;   BREAST REDUCTION SURGERY Left 03/07/2017   Procedure: LEFT BREAST REDUCTION FOR SYMMETRY;  Surgeon: Glenna Fellows, MD;  Location: [facility];  Service: Plastics;  Laterality: Left;   COLONOSCOPY W/ POLYPECTOMY  2010   Dr Loreta Ave   DILATION AND CURETTAGE OF UTERUS  1990s   HARDWARE REMOVAL Left 10/31/2019   Procedure: LEFT LEG MEDIAL HARDWARE REMOVAL;  Surgeon: Terance Hart, MD;  Location: [facility];  Service: Orthopedics;  Laterality: Left;  PROCEDURE: LEFT LEG MEDIAL HARDWARE REMOVAL LENGTH OF SURGERY: 1 HOUR   JOINT REPLACEMENT     LATISSIMUS FLAP TO BREAST Right 10/28/2016   Procedure: LATISSIMUS FLAP TO BREAST WITH PLACEMENT OF TISSUE EXPANDER ON  THE RIGHT;  Surgeon: Glenna Fellows, MD;  [facility]: [facility];  Service: Plastics;  Laterality: Right;  RFNA   MASTECTOMY Right 10/28/2016   RECONSTRUCTION BREAST W/ LATISSIMUS DORSI FLAP Right 10/28/2016    WITH PLACEMENT OF TISSUE EXPANDER    REMOVAL OF BILATERAL TISSUE EXPANDERS WITH PLACEMENT OF BILATERAL BREAST IMPLANTS Right 03/07/2017   Procedure: REMOVAL OF RIGHT BREAST TISSUE EXPANDER WITH PLACEMENT OF RIGHT BREAST IMPLANT;  Surgeon: Glenna Fellows, MD;  Location: [facility];  Service: Plastics;  Laterality: Right;   REMOVAL OF TISSUE EXPANDER AND PLACEMENT OF IMPLANT Left 06/03/2022   Procedure:  REMOVAL OF TISSUE EXPANDER AND PLACEMENT OF IMPLANT;  Surgeon: Glenna Fellows, MD;  Location: [facility];  Service: Plastics;  Laterality: Left;   SIMPLE MASTECTOMY WITH AXILLARY SENTINEL NODE BIOPSY Left 03/15/2022   Procedure: LEFT MASTECTOMY WITH INJECTION OF MAGTRACE;  Surgeon: Abigail Miyamoto, MD;  Location: [facility];  Service: General;  Laterality: Left;  LMA   TIBIA IM NAIL INSERTION Left 03/09/2019   Procedure: INTRAMEDULLARY (IM) NAIL TIBIAL;  Surgeon: Terance Hart, MD;  Location[facility];  Service: Orthopedics;  Laterality: Left;   TISSUE EXPANDER PLACEMENT Right 10/28/2016   Procedure: TISSUE EXPANDER;  Surgeon: Glenna Fellows, MD;  [facility]: [facility];  Service: Plastics;  Laterality: Right;   TOTAL HIP ARTHROPLASTY Right 05/12/2015   Procedure: RIGHT TOTAL HIP ARTHROPLASTY ANTERIOR APPROACH;  Surgeon: Durene Romans, MD;  [facility];  Service: Orthopedics;  Laterality: Right;   TOTAL HIP ARTHROPLASTY Left 06/23/2015   Procedure: LEFT TOTAL HIP ARTHROPLASTY ANTERIOR APPROACH;  Surgeon: Durene Romans, MD;  [facility];  Service: Orthopedics;  Laterality: Left;   TOTAL MASTECTOMY Right 10/28/2016   Procedure: RIGHT MASTECTOMY;  Surgeon: Ovidio Kin, MD;  Location: [facility];  Service: General;  Laterality: Right;   Patient Active Problem List   Diagnosis Date Noted   Ductal carcinoma in situ (DCIS) of left breast 03/15/2022   Moderate episode of recurrent major depressive disorder (HCC) 01/18/2021   History of therapeutic radiation 04/24/2020   Mild neurocognitive disorder, unclear etiology 04/24/2020   Insomnia 02/04/2020   Soft tissue mass 08/06/2019   Syncope 03/09/2019   Hypokalemia 03/09/2019   Greater trochanteric bursitis of left hip 08/09/2018   Left wrist pain 06/18/2018   OSA (obstructive sleep apnea) 02/19/2018   Chronic cough 02/19/2018   Type 2 diabetes mellitus with obesity (HCC) 01/26/2018   De Quervain's tenosynovitis,  bilateral 11/16/2017   Ductal carcinoma in situ (DCIS) of right breast 09/16/2016   Neck pain 12/02/2015   Preoperative clearance 11/10/2015   Peripheral edema 10/05/2015   Cervical pain (neck) 10/05/2015   S/P left THA, AA 06/23/2015   Obesity (BMI 30-39.9) 05/13/2015   Muscle spasm 02/27/2015   Pain in joint, shoulder region 09/05/2014   Leg swelling 12/08/2013   Other abnormal glucose 02/23/2012   Nonspecific abnormal electrocardiogram (ECG) (EKG) 02/23/2012   GERD (gastroesophageal reflux disease) 10/23/2009   Anxiety state 09/02/2009   Major depressive disorder 07/10/2008   History of breast cancer 07/10/2008   History of colonic polyps 07/10/2008   Fibroids, uterus 02/15/2008   History of dilation and curettage 02/15/2008   Essential hypertension 09/25/2007    ONSET DATE: 02/22/23  REFERRING DIAG: R26.89 balance problem   THERAPY DIAG:  Other lack of coordination  Other abnormalities of gait and mobility  Balance disorder  Rationale for Evaluation and Treatment: Rehabilitation  SUBJECTIVE:  SUBJECTIVE STATEMENT: I am always tired. When I turn, I still go off track some.   Pt accompanied by: self  PERTINENT HISTORY: R and L THA 2016  PAIN:  Are you having pain? No  PRECAUTIONS: None  RED FLAGS: None   WEIGHT BEARING RESTRICTIONS: No  FALLS: Has patient fallen in last 6 months? Yes. Number of falls 2-3  LIVING ENVIRONMENT: Lives with: lives alone Lives in: House/apartment Stairs: No  PLOF: Independent  PATIENT GOALS: work on my balance   OBJECTIVE:   DIAGNOSTIC FINDINGS: None  COGNITION: Overall cognitive status: Within functional limits for tasks assessed   SENSATION: WFL   POSTURE: No Significant postural limitations  LOWER EXTREMITY ROM:   grossly  WFL   LOWER EXTREMITY MMT:  grossly 4+/5   STAIRS: Level of Assistance: Complete Independence Stair Negotiation Technique: Alternating Pattern  with Single Rail on Left   FUNCTIONAL TESTS:  5 times sit to stand: 12.06s  Berg Balance Scale: 50/56 Functional gait assessment: 22/30   TODAY'S TREATMENT:                                                                                                                              DATE:  03/24/23 Walking outdoors 1 big lap NuStep L5 x70mins  Resisted gait 20# 4 way x4  Leg ext 10# 2x10 HS curls 25# 2x10 Tandem and SLS on firm, then on foam Step ups 6"   03/16/23 NuStep L5 x35mins  Step ups 6"  On airex EO, EC, then with feet together 30s  Cone taps on airex  Catch on airex  Walking on beam  Tandem on beam 10s SLS on beam 5s  Calf raises 2x10 Stepping over obstacles   EVAL 03/06/23    PATIENT EDUCATION: Education details: POC and HEP Person educated: Patient Education method: Explanation Education comprehension: verbalized understanding  HOME EXERCISE PROGRAM: Access Code: [email or web address] URL: [email or web address] Date: 03/06/2023 Prepared by: Cassie Freer  Exercises - Tandem Stance  - 1 x daily - 7 x weekly - 30 hold - Single Leg Stance  - 1 x daily - 7 x weekly - 10 hold - Heel Raises with Counter Support  - 1 x daily - 7 x weekly - 2 sets - 10 reps - Standing Hip Abduction with Counter Support  - 1 x daily - 7 x weekly - 2 sets - 10 reps - Sit to Stand  - 1 x daily - 7 x weekly - 2 sets - 10 reps  GOALS: Goals reviewed with patient? Yes  SHORT TERM GOALS: Target date: 04/03/23  Patient will be independent with initial HEP. Goal status: INITIAL  2.  Patient will be educated on strategies to decrease risk of falls.  Baseline: educated to remove loose rugs to decrease risk for falls Goal status: INITIAL   LONG TERM GOALS: Target date: 05/08/23  Patient will be independent with advanced/ongoing HEP to  improve outcomes and carryover.  Goal  status: INITIAL  2.  Patient will demonstrate SLS on foam surface for >10s bilaterally  Baseline: 4s at best Goal status: INITIAL  3.  Patient will be able to make quick turns without staggering   Baseline: loss of balance with turns Goal status: INITIAL   4.  Patient will demonstrate tandem stance on foam for >30s bilaterally Baseline: 10-15s Goal status: INITIAL  5.  Patient will demonstrate at least 25/30 on FGA to improve gait stability and reduce risk for falls. Baseline: 22/30 Goal status: INITIAL   ASSESSMENT:  CLINICAL IMPRESSION: Patient is a 62 y.o. female who was seen today for physical therapy treatment for balance problems.  We focused on mostly her balance, using airex pad and beam. She has some improvement with tandem and single leg stance on firm and foam surfaces.    OBJECTIVE IMPAIRMENTS: decreased balance.   REHAB POTENTIAL: Good  CLINICAL DECISION MAKING: Stable/uncomplicated  EVALUATION COMPLEXITY: Low  PLAN:  PT FREQUENCY: 1x/week  PT DURATION: 10 weeks  PLANNED INTERVENTIONS: Therapeutic exercises, Therapeutic activity, Neuromuscular re-education, Balance training, Gait training, Patient/Family education, Self Care, Joint mobilization, Stair training, and Manual therapy  PLAN FOR NEXT SESSION: balance training, practice more tandem and SLS, Calf raises 4 way step over    [facility], PT 03/24/2023, 9:30 AM

## 2023-03-24 ENCOUNTER — Ambulatory Visit: Payer: 59

## 2023-03-24 DIAGNOSIS — R278 Other lack of coordination: Secondary | ICD-10-CM | POA: Diagnosis not present

## 2023-03-24 DIAGNOSIS — R2689 Other abnormalities of gait and mobility: Secondary | ICD-10-CM

## 2023-03-25 ENCOUNTER — Other Ambulatory Visit: Payer: Self-pay | Admitting: Family Medicine

## 2023-03-25 DIAGNOSIS — E1169 Type 2 diabetes mellitus with other specified complication: Secondary | ICD-10-CM

## 2023-03-30 NOTE — Therapy (Signed)
OUTPATIENT PHYSICAL THERAPY NEURO TREATMENT   Patient Name: Madeline Chandler MRN: [id number] DOB:1961/03/06, 62 y.o., female Today's Date: 03/30/2023   PCP: Arva Chafe  REFERRING PROVIDER: Arva Chafe   END OF SESSION:      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   Bulging lumbar disc    2   Cervical pain (neck)    De Quervain's tenosynovitis, bilateral    Ductal carcinoma in situ (DCIS) of right breast 1997; 2018   s/p chemotherapy and radiation treatment   Essential hypertension    08/22/11: BUN 15, creatinine 0.95, GFR 79      Generalized anxiety disorder    GERD (gastroesophageal reflux disease)    History of colonic polyps    History of migraine headaches    "none in the last few years; had them mostly in late 20's/mid 30's"   History of therapeutic radiation    Hypokalemia    Insomnia    Major depressive disorder    Mild neurocognitive disorder, unclear etiology 04/24/2020   OSA (obstructive sleep apnea) 02/19/2018   Awaiting CPAP machine as of 04/24/2020   Restless leg syndrome    Type 2 diabetes mellitus    Upper respiratory infection 07/16/2010   Past Surgical History:  Procedure Laterality Date   ABDOMINAL HYSTERECTOMY  2000s   ANTERIOR CERVICAL DECOMP/DISCECTOMY FUSION N/A 12/02/2015   Procedure: ANTERIOR CERVICAL DECOMPRESSION/DISCECTOMY FUSION C4 - C7  3 LEVELS;  Surgeon: Venita Lick, MD;  [facility]: [facility];  Service: Orthopedics;  Laterality: N/A;   BACK SURGERY     BREAST BIOPSY Right 1997; 08/2016   BREAST LUMPECTOMY Right 1997   BREAST RECONSTRUCTION Right 07/07/2017   Procedure: RIGHT NIPPLE AEROLA RECONSTRUCTION WITH LOCAL FLAP FROM RIGHT GROIN;  Surgeon: Glenna Fellows, MD;  Location: [facility];  Service: Plastics;  Laterality: Right;   BREAST  RECONSTRUCTION WITH PLACEMENT OF TISSUE EXPANDER AND ALLODERM Left 03/15/2022   Procedure: LEFT BREAST RECONSTRUCTION WITH PLACEMENT OF TISSUE EXPANDER AND ALLODERM;  Surgeon: Glenna Fellows, MD;  Location: [facility];  Service: Plastics;  Laterality: Left;   BREAST REDUCTION SURGERY Left 03/07/2017   Procedure: LEFT BREAST REDUCTION FOR SYMMETRY;  Surgeon: Glenna Fellows, MD;  Location: [facility];  Service: Plastics;  Laterality: Left;   COLONOSCOPY W/ POLYPECTOMY  2010   Dr Loreta Ave   DILATION AND CURETTAGE OF UTERUS  1990s   HARDWARE REMOVAL Left 10/31/2019   Procedure: LEFT LEG MEDIAL HARDWARE REMOVAL;  Surgeon: Terance Hart, MD;  Location: [facility];  Service: Orthopedics;  Laterality: Left;  PROCEDURE: LEFT LEG MEDIAL HARDWARE REMOVAL LENGTH OF SURGERY: 1 HOUR   JOINT REPLACEMENT     LATISSIMUS FLAP TO BREAST Right 10/28/2016   Procedure: LATISSIMUS FLAP TO BREAST WITH PLACEMENT OF TISSUE EXPANDER ON THE RIGHT;  Surgeon: Glenna Fellows, MD;  [facility]: [facility];  Service: Plastics;  Laterality: Right;  RFNA   MASTECTOMY Right 10/28/2016   RECONSTRUCTION BREAST W/ LATISSIMUS DORSI FLAP Right 10/28/2016    WITH PLACEMENT OF TISSUE EXPANDER    REMOVAL OF BILATERAL TISSUE EXPANDERS WITH PLACEMENT OF BILATERAL BREAST IMPLANTS Right 03/07/2017   Procedure: REMOVAL OF RIGHT BREAST TISSUE EXPANDER WITH PLACEMENT OF RIGHT BREAST IMPLANT;  Surgeon: Glenna Fellows, MD;  Location:  [facility];  Service: Plastics;  Laterality: Right;   REMOVAL OF TISSUE EXPANDER AND PLACEMENT OF IMPLANT Left 06/03/2022   Procedure: REMOVAL OF TISSUE EXPANDER AND PLACEMENT OF IMPLANT;  Surgeon: Glenna Fellows, MD;  Location: [facility];  Service: Plastics;  Laterality: Left;   SIMPLE MASTECTOMY WITH AXILLARY SENTINEL NODE BIOPSY Left 03/15/2022   Procedure: LEFT MASTECTOMY WITH INJECTION OF MAGTRACE;  Surgeon: Abigail Miyamoto, MD;   Location: [facility];  Service: General;  Laterality: Left;  LMA   TIBIA IM NAIL INSERTION Left 03/09/2019   Procedure: INTRAMEDULLARY (IM) NAIL TIBIAL;  Surgeon: Terance Hart, MD;  Location: [facility];  Service: Orthopedics;  Laterality: Left;   TISSUE EXPANDER PLACEMENT Right 10/28/2016   Procedure: TISSUE EXPANDER;  Surgeon: Glenna Fellows, MD;  Location: [facility];  Service: Plastics;  Laterality: Right;   TOTAL HIP ARTHROPLASTY Right 05/12/2015   Procedure: RIGHT TOTAL HIP ARTHROPLASTY ANTERIOR APPROACH;  Surgeon: Durene Romans, MD;  [facility]: [facility];  Service: Orthopedics;  Laterality: Right;   TOTAL HIP ARTHROPLASTY Left 06/23/2015   Procedure: LEFT TOTAL HIP ARTHROPLASTY ANTERIOR APPROACH;  Surgeon: Durene Romans, MD;  Location: [facility];  Service: Orthopedics;  Laterality: Left;   TOTAL MASTECTOMY Right 10/28/2016   Procedure: RIGHT MASTECTOMY;  Surgeon: Ovidio Kin, MD;  Location: [facility];  Service: General;  Laterality: Right;   Patient Active Problem List   Diagnosis Date Noted   Ductal carcinoma in situ (DCIS) of left breast 03/15/2022   Moderate episode of recurrent major depressive disorder (HCC) 01/18/2021   History of therapeutic radiation 04/24/2020   Mild neurocognitive disorder, unclear etiology 04/24/2020   Insomnia 02/04/2020   Soft tissue mass 08/06/2019   Syncope 03/09/2019   Hypokalemia 03/09/2019   Greater trochanteric bursitis of left hip 08/09/2018   Left wrist pain 06/18/2018   OSA (obstructive sleep apnea) 02/19/2018   Chronic cough 02/19/2018   Type 2 diabetes mellitus with obesity (HCC) 01/26/2018   De Quervain's tenosynovitis, bilateral 11/16/2017   Ductal carcinoma in situ (DCIS) of right breast 09/16/2016   Neck pain 12/02/2015   Preoperative clearance 11/10/2015   Peripheral edema 10/05/2015   Cervical pain (neck) 10/05/2015   S/P left THA, AA 06/23/2015   Obesity (BMI 30-39.9) 05/13/2015   Muscle spasm 02/27/2015   Pain in joint,  shoulder region 09/05/2014   Leg swelling 12/08/2013   Other abnormal glucose 02/23/2012   Nonspecific abnormal electrocardiogram (ECG) (EKG) 02/23/2012   GERD (gastroesophageal reflux disease) 10/23/2009   Anxiety state 09/02/2009   Major depressive disorder 07/10/2008   History of breast cancer 07/10/2008   History of colonic polyps 07/10/2008   Fibroids, uterus 02/15/2008   History of dilation and curettage 02/15/2008   Essential hypertension 09/25/2007    ONSET DATE: 02/22/23  REFERRING DIAG: R26.89 balance problem   THERAPY DIAG:  No diagnosis found.  Rationale for Evaluation and Treatment: Rehabilitation  SUBJECTIVE:  SUBJECTIVE STATEMENT: I have some bursitis in my hips that I think is hurting. I am tired.   Pt accompanied by: self  PERTINENT HISTORY: R and L THA 2016  PAIN:  Are you having pain? No  PRECAUTIONS: None  RED FLAGS: None   WEIGHT BEARING RESTRICTIONS: No  FALLS: Has patient fallen in last 6 months? Yes. Number of falls 2-3  LIVING ENVIRONMENT: Lives with: lives alone Lives in: House/apartment Stairs: No  PLOF: Independent  PATIENT GOALS: work on my balance   OBJECTIVE:   DIAGNOSTIC FINDINGS: None  COGNITION: Overall cognitive status: Within functional limits for tasks assessed   SENSATION: WFL   POSTURE: No Significant postural limitations  LOWER EXTREMITY ROM:   grossly WFL   LOWER EXTREMITY MMT:  grossly 4+/5   STAIRS: Level of Assistance: Complete Independence Stair Negotiation Technique: Alternating Pattern  with Single Rail on Left   FUNCTIONAL TESTS:  5 times sit to stand: 12.06s  Berg Balance Scale: 50/56 Functional gait assessment: 22/30   TODAY'S TREATMENT:                                                                                                                               DATE:  03/31/23 Bike L3 x39mins  Tandem stance 30s SLS 10s On foam tandem- 14s, SLS- 5s  Stepping and reaching and then turning and reaching x10 Balance on BOSU, than rocking forwards and side to side  4 way step over  Calf raises 2x10 Leg press 20# 2x10  03/24/23 Walking outdoors 1 big lap NuStep L5 x67mins  Resisted gait 20# 4 way x4  Leg ext 10# 2x10 HS curls 25# 2x10 Tandem and SLS on firm, then on foam Step ups 6"   03/16/23 NuStep L5 x40mins  Step ups 6"  On airex EO, EC, then with feet together 30s  Cone taps on airex  Catch on airex  Walking on beam  Tandem on beam 10s SLS on beam 5s  Calf raises 2x10 Stepping over obstacles   EVAL 03/06/23    PATIENT EDUCATION: Education details: POC and HEP Person educated: Patient Education method: Explanation Education comprehension: verbalized understanding  HOME EXERCISE PROGRAM: Access Code: [email or web address] URL: [email or web address] Date: 03/06/2023 Prepared by: Cassie Freer  Exercises - Tandem Stance  - 1 x daily - 7 x weekly - 30 hold - Single Leg Stance  - 1 x daily - 7 x weekly - 10 hold - Heel Raises with Counter Support  - 1 x daily - 7 x weekly - 2 sets - 10 reps - Standing Hip Abduction with Counter Support  - 1 x daily - 7 x weekly - 2 sets - 10 reps - Sit to Stand  - 1 x daily - 7 x weekly - 2 sets - 10 reps  GOALS: Goals reviewed with patient? Yes  SHORT TERM GOALS: Target date: 04/03/23  Patient will be independent with initial HEP. Goal status: MET  2.  Patient will be educated on strategies to decrease risk of falls.  Baseline: educated to remove loose rugs to decrease risk for falls Goal status: MET   LONG TERM GOALS: Target date: 05/08/23  Patient will be independent with advanced/ongoing HEP to improve outcomes and carryover.  Goal status: INITIAL  2.  Patient will demonstrate SLS on foam surface for >10s bilaterally   Baseline: 4s at best, 6s 03/31/23 Goal status: IN PROGRESS  3.  Patient will be able to make quick turns without staggering   Baseline: loss of balance with turns Goal status: INITIAL   4.  Patient will demonstrate tandem stance on foam for >30s bilaterally Baseline: 10-15s, 14s 03/31/23 Goal status: IN PROGRESS  5.  Patient will demonstrate at least 25/30 on FGA to improve gait stability and reduce risk for falls. Baseline: 22/30 Goal status: INITIAL   ASSESSMENT:  CLINICAL IMPRESSION: Patient is a 62 y.o. female who was seen today for physical therapy treatment for balance problems.  We focused on mostly her balance, using airex pad and beam. Some difficulty with direction for stepping and reaching and with turning and reaching. Reports that the turns is usually when she feels off balance.     OBJECTIVE IMPAIRMENTS: decreased balance.   REHAB POTENTIAL: Good  CLINICAL DECISION MAKING: Stable/uncomplicated  EVALUATION COMPLEXITY: Low  PLAN:  PT FREQUENCY: 1x/week  PT DURATION: 10 weeks  PLANNED INTERVENTIONS: Therapeutic exercises, Therapeutic activity, Neuromuscular re-education, Balance training, Gait training, Patient/Family education, Self Care, Joint mobilization, Stair training, and Manual therapy  PLAN FOR NEXT SESSION: balance training, practice more tandem and SLS, Calf raises 4 way step over    [facility], PT 03/30/2023, 5:59 PM

## 2023-03-31 ENCOUNTER — Inpatient Hospital Stay: Payer: 59 | Attending: Oncology | Admitting: Oncology

## 2023-03-31 ENCOUNTER — Ambulatory Visit: Payer: 59

## 2023-03-31 VITALS — BP 118/66 | HR 68 | Temp 98.9°F | Resp 18 | Ht 65.0 in | Wt 189.2 lb

## 2023-03-31 DIAGNOSIS — Z90722 Acquired absence of ovaries, bilateral: Secondary | ICD-10-CM | POA: Insufficient documentation

## 2023-03-31 DIAGNOSIS — Z923 Personal history of irradiation: Secondary | ICD-10-CM | POA: Insufficient documentation

## 2023-03-31 DIAGNOSIS — Z9011 Acquired absence of right breast and nipple: Secondary | ICD-10-CM | POA: Insufficient documentation

## 2023-03-31 DIAGNOSIS — Z86000 Personal history of in-situ neoplasm of breast: Secondary | ICD-10-CM | POA: Insufficient documentation

## 2023-03-31 DIAGNOSIS — D0512 Intraductal carcinoma in situ of left breast: Secondary | ICD-10-CM

## 2023-03-31 DIAGNOSIS — Z90710 Acquired absence of both cervix and uterus: Secondary | ICD-10-CM | POA: Diagnosis not present

## 2023-03-31 DIAGNOSIS — Z9221 Personal history of antineoplastic chemotherapy: Secondary | ICD-10-CM | POA: Insufficient documentation

## 2023-03-31 DIAGNOSIS — R278 Other lack of coordination: Secondary | ICD-10-CM

## 2023-03-31 DIAGNOSIS — Z853 Personal history of malignant neoplasm of breast: Secondary | ICD-10-CM | POA: Diagnosis not present

## 2023-03-31 DIAGNOSIS — R2689 Other abnormalities of gait and mobility: Secondary | ICD-10-CM

## 2023-03-31 NOTE — Progress Notes (Signed)
  [facility] OFFICE PROGRESS NOTE   Diagnosis: Breast cancer  INTERVAL HISTORY:   Ms. Zavalza returns as scheduled.  She feels well.  No change over either chest wall.  She has hyperpigmented lesions over the face and trunk.  She reports chronic insomnia.  Objective:  Vital signs in last 24 hours:  Blood pressure 118/66, pulse 68, temperature 98.9 F (37.2 C), temperature source Oral, resp. rate 18, height 5\' 5"  (1.651 m), weight 189 lb 3.2 oz (85.8 kg), SpO2 100%.     Lymphatics: No cervical, supraclavicular, or axillary nodes Resp: Lungs clear bilaterally Cardio: Regular rate and rhythm GI: No hepatosplenomegaly Vascular: No leg edema, the left lower leg is slightly larger than the right side Skin: Hyperpigmented maculopapular areas over the face and upper back  Lab Results:  Lab Results  Component Value Date   WBC 5.7 02/22/2023   HGB 12.7 02/22/2023   HCT 39.4 02/22/2023   MCV 89.0 02/22/2023   PLT 247.0 02/22/2023   NEUTROABS 6.4 03/09/2019    CMP  Lab Results  Component Value Date   NA 139 03/03/2023   K 3.7 03/03/2023   CL 98 03/03/2023   CO2 33 (H) 03/03/2023   GLUCOSE 87 03/03/2023   BUN 13 03/03/2023   CREATININE 0.81 03/03/2023   CALCIUM 9.7 03/03/2023   PROT 7.5 02/22/2023   ALBUMIN 4.1 02/22/2023   AST 18 02/22/2023   ALT 15 02/22/2023   ALKPHOS 111 02/22/2023   BILITOT 0.6 02/22/2023   GFRNONAA >60 05/31/2022   GFRAA >60 10/28/2019    Medications: I have reviewed the patient's current medications.   Assessment/Plan: Stage II (T2 N0) sees right-sided breast cancer diagnosed in September 1997, status post a lumpectomy, sentinel lymph node biopsy, and right axillary dissection and radiation followed by adjuvant chemotherapy. She completed 5 years of tamoxifen.   2.  DCIS with a microscopic area of invasive carcinoma noted on biopsy of an area of distortion in the upper inner right breast January 2018 DCIS is ER positive, PR  positive, and HER-2 positive (not enough invasive tumor for a breast prognostic profile) Right mastectomy 10/28/2016, 1.4 cm intermediate grade DCIS, negative resection margins, no residual invasive carcinoma identified, no lymph nodes identified in submitted axillary contents Adjuvant Femara 11/11/2016, discontinued 03/29/2022 3. Hysterectomy and bilateral oophorectomy April 2008   4. Right breast implant and left breast reduction 03/07/2017  5.  Negative genetic testing 2018-[employer] Common Cancer Panel-normal  6.  Left breast DCIS Diagnostic left mammogram 01/07/2022-diffuse suspicious linear branching calcifications throughout the left breast suspicious for DCIS Stereotactic core biopsy of outer extent of left breast calcifications and inner extent of left breast calcifications 01/14/2022-DCIS, solid/apocrine with comedonecrosis and calcifications, grade 2/3, foci suspicious for microinvasion involving the inner and outer aspect biopsies, ER 95%, PR 0%,Ki-67 10% Left mastectomy and immediate reconstruction 03/15/2022-DCIS involving the lateral and medial quadrants and within a lactiferous duct beneath the nipple, high-grade, necrosis present, negative margins, negative for invasive carcinoma      Disposition: Madeline Chandler is in clinical remission from breast cancer.  She would like to continue follow-up at the Cancer center.  She will return for an office visit in 1 year.  The hyperpigmented skin lesions may be related to an eczema like condition.  I recommended she follow-up with her primary provider and dermatology.  Thornton Papas, MD  03/31/2023  10:48 AM

## 2023-04-03 ENCOUNTER — Telehealth: Payer: Self-pay | Admitting: Family Medicine

## 2023-04-03 NOTE — Telephone Encounter (Signed)
Called the patient to inform per PCP we have not received any forms for her. She will have them faxed back.

## 2023-04-03 NOTE — Telephone Encounter (Signed)
Pt called & mentioned that she faxed two documents to [place] about a month ago for a physicians order and a accomodation form. Please call & advise pt to follow-up if those documents were received.

## 2023-04-07 ENCOUNTER — Ambulatory Visit: Payer: 59

## 2023-04-07 NOTE — Telephone Encounter (Signed)
Received form Scheduled appt with PCP for 04/17/23 at 3:30 to complete

## 2023-04-17 ENCOUNTER — Ambulatory Visit (INDEPENDENT_AMBULATORY_CARE_PROVIDER_SITE_OTHER): Payer: 59 | Admitting: Family Medicine

## 2023-04-17 ENCOUNTER — Encounter: Payer: Self-pay | Admitting: Family Medicine

## 2023-04-17 VITALS — BP 132/82 | HR 77 | Temp 98.2°F | Ht 65.5 in | Wt 189.4 lb

## 2023-04-17 DIAGNOSIS — R2689 Other abnormalities of gait and mobility: Secondary | ICD-10-CM

## 2023-04-17 DIAGNOSIS — Z853 Personal history of malignant neoplasm of breast: Secondary | ICD-10-CM

## 2023-04-17 DIAGNOSIS — F411 Generalized anxiety disorder: Secondary | ICD-10-CM | POA: Diagnosis not present

## 2023-04-17 NOTE — Patient Instructions (Signed)
Let us know if you need anything.

## 2023-04-17 NOTE — Progress Notes (Signed)
Chief Complaint  Patient presents with   paperwork     Subjective: Patient is a 62 y.o. female here for completion of forms.  Patient has a history of breast cancer, anxiety, and balance issues.  She is currently following with physical therapy.  She also sees oncology.  She is requesting a job accommodation form to allow her to work 4 days/week, 10 hours/day to allow her an extra day to attend appointments.  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   Bulging lumbar disc    2   Cervical pain (neck)    De Quervain's tenosynovitis, bilateral    Ductal carcinoma in situ (DCIS) of right breast 1997; 2018   s/p chemotherapy and radiation treatment   Essential hypertension    08/22/11: BUN 15, creatinine 0.95, GFR 79      Generalized anxiety disorder    GERD (gastroesophageal reflux disease)    History of colonic polyps    History of migraine headaches    "none in the last few years; had them mostly in late 20's/mid 30's"   History of therapeutic radiation    Hypokalemia    Insomnia    Major depressive disorder    Mild neurocognitive disorder, unclear etiology 04/24/2020   OSA (obstructive sleep apnea) 02/19/2018   Awaiting CPAP machine as of 04/24/2020   Restless leg syndrome    Type 2 diabetes mellitus    Upper respiratory infection 07/16/2010    Objective: BP 132/82 (BP Location: Left Arm, Cuff Size: Large)   Pulse 77   Temp 98.2 F (36.8 C) (Oral)   Ht 5' 5.5" (1.664 m)   Wt 189 lb 6 oz (85.9 kg)   SpO2 98%   BMI 31.03 kg/m  General: Awake, appears stated age Heart: RRR Lungs: No accessory muscle use Psych: Age appropriate judgment and insight, normal affect and mood  Assessment and Plan: Anxiety state  History of breast cancer  Balance problem  Form completed with patient requesting to allow her to  be allowed to work for 10-hour days in an effort to allow her to be able to attend appointments without taking time off of work.  Follow-up as originally scheduled. The patient voiced understanding and agreement to the plan.  I spent 21 minutes with the patient discussing the above plan, filling out paperwork, and reviewing her chart on the same day of the visit.  Jilda Roche [place], DO 04/17/23  4:54 PM

## 2023-05-05 ENCOUNTER — Ambulatory Visit (INDEPENDENT_AMBULATORY_CARE_PROVIDER_SITE_OTHER): Payer: 59 | Admitting: Family Medicine

## 2023-05-05 ENCOUNTER — Encounter: Payer: Self-pay | Admitting: Family Medicine

## 2023-05-05 VITALS — BP 120/70 | HR 84 | Temp 97.8°F | Resp 16 | Ht 65.0 in | Wt 193.2 lb

## 2023-05-05 DIAGNOSIS — Z23 Encounter for immunization: Secondary | ICD-10-CM

## 2023-05-05 DIAGNOSIS — F411 Generalized anxiety disorder: Secondary | ICD-10-CM

## 2023-05-05 MED ORDER — AMITRIPTYLINE HCL 10 MG PO TABS: 10.0000 mg | ORAL_TABLET | Freq: Every day | ORAL | 1 refills | Status: DC

## 2023-05-05 NOTE — Progress Notes (Signed)
Chief Complaint  Patient presents with   paperwork    Discuss paperwork    Subjective Madeline Chandler presents for f/u anxiety.  Pt is currently being treated with Effexor XR 75 mg daily, hydroxyzine 12.5-25 mg 3 times daily as needed.  Reports not much since treatment. No thoughts of harming self or others. No self-medication with alcohol, prescription drugs or illicit drugs. Pt is not following with a counselor/psychologist. Requesting FMLA to allow for 3 days/month for when her anxiety peaks.  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   Bulging lumbar disc    2   Cervical pain (neck)    De Quervain's tenosynovitis, bilateral    Ductal carcinoma in situ (DCIS) of right breast 1997; 2018   s/p chemotherapy and radiation treatment   Essential hypertension    08/22/11: BUN 15, creatinine 0.95, GFR 79      Generalized anxiety disorder    GERD (gastroesophageal reflux disease)    History of colonic polyps    History of migraine headaches    "none in the last few years; had them mostly in late 20's/mid 30's"   History of therapeutic radiation    Hypokalemia    Insomnia    Major depressive disorder    Mild neurocognitive disorder, unclear etiology 04/24/2020   OSA (obstructive sleep apnea) 02/19/2018   Awaiting CPAP machine as of 04/24/2020   Restless leg syndrome    Type 2 diabetes mellitus    Upper respiratory infection 07/16/2010   Allergies as of 05/05/2023       Reactions   Prednisone Other (See Comments)   headaches        Medication List        Accurate as of May 05, 2023  4:29 PM. If you have any questions, ask your nurse or doctor.          STOP taking these medications    venlafaxine XR 75 MG 24 hr capsule Commonly known as: EFFEXOR-XR Stopped by: Jilda Roche Eugene Zeiders        TAKE these medications    amitriptyline 10 MG tablet Commonly known as: ELAVIL Take 1 tablet (10 mg total) by mouth at bedtime. Started by: Jilda Roche Jayton Popelka   amLODipine 5 MG tablet Commonly known as: NORVASC Take 1 tablet (5 mg total) by mouth daily.   amphetamine-dextroamphetamine 20 MG tablet Commonly known as: ADDERALL Take 20 mg by mouth daily.   atorvastatin 40 MG tablet Commonly known as: LIPITOR TAKE 1 TABLET BY MOUTH DAILY   carvedilol 25 MG tablet Commonly known as: COREG TAKE 1 TABLET BY MOUTH TWICE A DAY WITH A MEAL   hydrOXYzine 25 MG tablet Commonly known as: ATARAX Take 0.5-2 tablets (12.5-50 mg total) by mouth 3 (three) times daily as needed for anxiety.   losartan-hydrochlorothiazide 100-25 MG tablet Commonly known as: HYZAAR TAKE 1 TABLET BY MOUTH DAILY   meloxicam 15 MG tablet Commonly known as: MOBIC Take 1 tablet (15 mg total) by mouth daily as needed for pain.   omeprazole 40 MG capsule Commonly known as: PRILOSEC TAKE 1 CAPSULE BY MOUTH DAILY   OneTouch Delica Lancets Fine Misc Use as directed once daily to check blood sugar.  DXE11.9   OneTouch Verio test strip Generic drug: glucose blood Use  once daily to check blood sugar.  DX E11.9   Ozempic (2 MG/DOSE) 8 MG/3ML Sopn Generic drug: Semaglutide (2 MG/DOSE) DIAL AND INJECT UNDER THE SKIN 2 MG WEEKLY        Exam BP 120/70 (BP Location: Left Arm, Patient Position: Sitting, Cuff Size: Normal)   Pulse 84   Temp 97.8 F (36.6 C) (Oral)   Resp 16   Ht 5\' 5"  (1.651 m)   Wt 193 lb 3.2 oz (87.6 kg)   SpO2 97%   BMI 32.15 kg/m  General:  well developed, well nourished, in no apparent distress Lungs:  No respiratory distress Psych: well oriented with normal range of affect and age-appropriate judgement/insight, alert and oriented x4.  Assessment and Plan  Anxiety state - Plan: amitriptyline (ELAVIL) 10 MG tablet  Need for influenza vaccination - Plan: Flu vaccine  trivalent PF, 6mos and older(Flulaval,Afluria,Fluarix,Fluzone)  Chronic, unstable.  Stop Effexor, start Elavil 10 mg nightly.  Counseling information provided.  Continue hydroxyzine as needed.  Follow-up in 1 month to recheck. Flu shot today. The patient voiced understanding and agreement to the plan.  Jilda Roche [place], DO 05/05/23 4:29 PM

## 2023-05-05 NOTE — Patient Instructions (Addendum)
Consider a light box for when it gets cloudy out and the weather is affecting your mood.   [facility] [address] [place], [place] [address] [phone]  [facility] [address] [place], [place] [address] [phone]  [facility] [address] [place], [place] [address] [phone]  [facility] [address], [place], [place], [phone] [address], [place], [place], #[phone]  [facility] [address] [place], [place] [address] [phone]  [facility] [address] [place], [place] [phone]  [facility] [address] [place], [place] [phone]  [facility] [address] [place], [place]   [phone].   Please go online to complete a form to submit first.  [facility]  [address] [place], [place] [phone].     [facility] -  local practices located at: [address], [place], [place]. [phone]. [address] [place], [place], [place].  [phone]. [address], [address], [place], [place].  [phone].  Contact one of these offices sooner than later as it can take 2-3 months to get a new patient appointment.   Let us know if you need anything.

## 2023-05-10 ENCOUNTER — Telehealth: Payer: Self-pay | Admitting: Family Medicine

## 2023-05-10 NOTE — Telephone Encounter (Signed)
Pt called and stated that the FMLA paperwork that was faxed to [employer] did not contain all the pages that were needed. Please re-fax at earliest convenience.

## 2023-05-10 NOTE — Telephone Encounter (Signed)
Faxed all paperwork again [phone]

## 2023-05-23 ENCOUNTER — Telehealth: Payer: Self-pay

## 2023-05-23 NOTE — Telephone Encounter (Signed)
Pharmacy Patient Advocate Encounter   Received notification from CoverMyMeds that prior authorization for Ozempic (1 MG/DOSE) 4MG /3ML pen-injectors is required/requested.   Insurance verification completed.   The patient is insured through [facility] .   Per test claim: PA required; PA submitted to [facility] via CoverMyMeds Key/confirmation #/EOC [id number] Status is pending

## 2023-05-25 ENCOUNTER — Other Ambulatory Visit (HOSPITAL_COMMUNITY): Payer: Self-pay

## 2023-05-25 NOTE — Telephone Encounter (Signed)
Pharmacy Patient Advocate Encounter  Received notification from [facility] that Prior Authorization for Ozempic (1 MG/DOSE) 4MG /3ML pen-injectors has been APPROVED from 05/23/23 to 05/22/24   PA #/Case ID/Reference #: [id number]    *Please note: OZEMPIC INJ 4MG /3ML has been approved one time for therapeutic duplication purposes for up to 3mL per month until 06/06/2023 or until coverage for the medication is no longer available under your benefit plan or the medication becomes subject to a pharmacy benefit coverage requirement, such as supply limits or notification, whichever occurs first as allowed by law.   Refill payable on or after 06/06/23

## 2023-05-29 ENCOUNTER — Telehealth: Payer: Self-pay | Admitting: Family Medicine

## 2023-05-29 NOTE — Telephone Encounter (Signed)
Called the patient and clarified response from PCP.

## 2023-05-29 NOTE — Telephone Encounter (Signed)
Patient needs clarification on her medication. She said she was on venlafaxine (to be taken at breakfast time) but since that wasn't working, provider prescribed Amitriptyline to be taken at bedtime. Pt wants to know if she should be taken both medications or is the amitriptyline replacing the venlafaxine. Please call to confirm.

## 2023-05-29 NOTE — Telephone Encounter (Signed)
We are done with the Effexor. Ty.

## 2023-06-14 ENCOUNTER — Other Ambulatory Visit: Payer: Self-pay | Admitting: Family Medicine

## 2023-06-14 DIAGNOSIS — E1169 Type 2 diabetes mellitus with other specified complication: Secondary | ICD-10-CM

## 2023-06-14 DIAGNOSIS — E669 Obesity, unspecified: Secondary | ICD-10-CM

## 2023-06-30 ENCOUNTER — Other Ambulatory Visit: Payer: Self-pay | Admitting: Family Medicine

## 2023-06-30 DIAGNOSIS — F411 Generalized anxiety disorder: Secondary | ICD-10-CM

## 2023-07-21 ENCOUNTER — Other Ambulatory Visit: Payer: Self-pay | Admitting: Family Medicine

## 2023-07-21 DIAGNOSIS — E1169 Type 2 diabetes mellitus with other specified complication: Secondary | ICD-10-CM

## 2023-08-06 ENCOUNTER — Other Ambulatory Visit: Payer: Self-pay | Admitting: Family Medicine

## 2023-08-06 DIAGNOSIS — I10 Essential (primary) hypertension: Secondary | ICD-10-CM

## 2023-08-08 ENCOUNTER — Other Ambulatory Visit: Payer: Self-pay | Admitting: Family Medicine

## 2023-08-08 DIAGNOSIS — I10 Essential (primary) hypertension: Secondary | ICD-10-CM

## 2023-08-16 ENCOUNTER — Ambulatory Visit
Admission: RE | Admit: 2023-08-16 | Discharge: 2023-08-16 | Disposition: A | Discharge status: Home / Self Care | Payer: 59 | Source: Ambulatory Visit | Attending: Family Medicine | Admitting: Family Medicine

## 2023-08-16 ENCOUNTER — Ambulatory Visit: Payer: Self-pay | Admitting: Family Medicine

## 2023-08-16 VITALS — BP 154/81 | HR 84 | Temp 97.8°F | Resp 17

## 2023-08-16 DIAGNOSIS — J069 Acute upper respiratory infection, unspecified: Secondary | ICD-10-CM | POA: Diagnosis not present

## 2023-08-16 NOTE — Telephone Encounter (Signed)
 Copied from CRM [id number]. Topic: Clinical - Pink Word Triage >> Aug 16, 2023 11:14 AM Joanell B wrote: Reason for Triage: Pt stated that she is needing some medication due to her being sick since Sunday she has been coughing, nausea, sore throat, headache, no appetite.   Chief Complaint: sinus pain, headache Symptoms: sinus pain, headache, congestion, runny nose, cough, sore throat, nausea, no appetite, prior body aches, prior diarrhea Frequency: continual Pertinent Negatives: Patient denies SOB, chest pain, vomiting  Disposition: [] ED /[x] Urgent Care (no appt availability in office) / [] Appointment(In office/virtual)/ []  [facility]/ [] Home Care/ [] Refused Recommended Disposition /[] [facility]/ []  Follow-up with PCP Additional Notes: Pt reporting symptoms  started Saturday night real bad scratchy throat, then Sunday runny nose and stopped up, headaches in middle of forehead, nausea started today, not much appetite, taken ozempic  but take bite or two of something, can't eat much anything else. Pt confirms some diarrhea, none today. Pt confirms no vomiting, no SOB, cough not productive. Pt confirms clear mucus. Pt confirms just little bit of ear pain, not a whole lot. Advised pt be examined in person today. No availability with PCP office or 3 nearest offices today, pt opting for UC. Scheduled appt today at [facility] in [place].  Reason for Disposition  Earache  Answer Assessment - Initial Assessment Questions 1. LOCATION: Where does it hurt?      Pain in middle forehead, sinus pain 2. ONSET: When did the sinus pain start?  (e.g., hours, days)      On and off but started back up today 3. SEVERITY: How bad is the pain?   (Scale 1-10; mild, moderate or severe)   - MILD (1-3): doesn't interfere with normal activities    - MODERATE (4-7): interferes with normal activities (e.g., work or school) or awakens from sleep   - SEVERE (8-10): excruciating  pain and patient unable to do any normal activities        Right now almost a 5,  4. RECURRENT SYMPTOM: Have you ever had sinus problems before? If Yes, ask: When was the last time? and What happened that time?      yes 5. NASAL CONGESTION: Is the nose blocked? If Yes, ask: Can you open it or must you breathe through your mouth?     Sometimes have to open mouth to breathe, sometimes one open at a time 6. NASAL DISCHARGE: Do you have discharge from your nose? If so ask, What color?     Clear nasal discharge 7. FEVER: Do you have a fever? If Yes, ask: What is it, how was it measured, and when did it start?      No fever, not taken temp today 8. OTHER SYMPTOMS: Do you have any other symptoms? (e.g., sore throat, cough, earache, difficulty breathing)     *No Answer*  Protocols used: Sinus Pain or Congestion-A-AH

## 2023-08-16 NOTE — ED Provider Notes (Signed)
 [facility]    CSN: [id number] Arrival date & time: 08/16/23  1208      History   Chief Complaint Chief Complaint  Patient presents with   Nasal Congestion    HPI Madeline Chandler is a 63 y.o. female.   HPI Not feeling well for 4 days symptoms started with a scratchy throat which has resolved then developed clear rhinorrhea, nasal congestion and a cough.  Denies fever, chills, sweats, body aches, fatigue.  Admits mild nausea.  Did travel to [place]  for Christmas denies confirmed contacts with illness.  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   Bulging lumbar disc    2   Cervical pain (neck)    De Quervain's tenosynovitis, bilateral    Ductal carcinoma in situ (DCIS) of right breast 1997; 2018   s/p chemotherapy and radiation treatment   Essential hypertension    08/22/11: BUN 15, creatinine 0.95, GFR 79      Generalized anxiety disorder    GERD (gastroesophageal reflux disease)    History of colonic polyps    History of migraine headaches    none in the last few years; had them mostly in late 20's/mid 30's   History of therapeutic radiation    Hypokalemia    Insomnia    Major depressive disorder    Mild neurocognitive disorder, unclear etiology 04/24/2020   OSA (obstructive sleep apnea) 02/19/2018   Awaiting CPAP machine as of 04/24/2020   Restless leg syndrome    Type 2 diabetes mellitus    Upper respiratory infection 07/16/2010    Patient Active Problem List   Diagnosis Date Noted   Ductal carcinoma in situ (DCIS) of left breast 03/15/2022   Moderate episode of recurrent major depressive disorder (HCC) 01/18/2021   History of therapeutic radiation 04/24/2020   Mild neurocognitive disorder, unclear etiology 04/24/2020   Insomnia 02/04/2020   Soft tissue mass 08/06/2019   Syncope  03/09/2019   Hypokalemia 03/09/2019   Greater trochanteric bursitis of left hip 08/09/2018   Left wrist pain 06/18/2018   OSA (obstructive sleep apnea) 02/19/2018   Chronic cough 02/19/2018   Type 2 diabetes mellitus with obesity (HCC) 01/26/2018   De Quervain's tenosynovitis, bilateral 11/16/2017   Ductal carcinoma in situ (DCIS) of right breast 09/16/2016   Neck pain 12/02/2015   Preoperative clearance 11/10/2015   Peripheral edema 10/05/2015   Cervical pain (neck) 10/05/2015   S/P left THA, AA 06/23/2015   Obesity (BMI 30-39.9) 05/13/2015   Muscle spasm 02/27/2015   Pain in joint, shoulder region 09/05/2014   Leg swelling 12/08/2013   Other abnormal glucose 02/23/2012   Nonspecific abnormal electrocardiogram (ECG) (EKG) 02/23/2012   GERD (gastroesophageal reflux disease) 10/23/2009   Anxiety state 09/02/2009   Major depressive disorder 07/10/2008   History of breast cancer 07/10/2008   History of colonic polyps 07/10/2008   Fibroids, uterus 02/15/2008   History of dilation and curettage 02/15/2008   Essential hypertension 09/25/2007    Past Surgical History:  Procedure Laterality Date   ABDOMINAL HYSTERECTOMY  2000s   ANTERIOR CERVICAL DECOMP/DISCECTOMY FUSION N/A 12/02/2015   Procedure: ANTERIOR CERVICAL DECOMPRESSION/DISCECTOMY FUSION C4 - C7  3 LEVELS;  Surgeon: Donaciano Sprang, MD;  Location: [facility];  Service: Orthopedics;  Laterality: N/A;   BACK  SURGERY     BREAST BIOPSY Right 1997; 08/2016   BREAST LUMPECTOMY Right 1997   BREAST RECONSTRUCTION Right 07/07/2017   Procedure: RIGHT NIPPLE AEROLA RECONSTRUCTION WITH LOCAL FLAP FROM RIGHT GROIN;  Surgeon: Arelia Filippo, MD;  Location: [facility];  Service: Plastics;  Laterality: Right;   BREAST RECONSTRUCTION WITH PLACEMENT OF TISSUE EXPANDER AND ALLODERM Left 03/15/2022   Procedure: LEFT BREAST RECONSTRUCTION WITH PLACEMENT OF TISSUE EXPANDER AND ALLODERM;  Surgeon: Arelia Filippo, MD;  Location: [facility];  Service: Plastics;  Laterality: Left;   BREAST REDUCTION SURGERY Left 03/07/2017   Procedure: LEFT BREAST REDUCTION FOR SYMMETRY;  Surgeon: Arelia Filippo, MD;  Location: [facility];  Service: Plastics;  Laterality: Left;   COLONOSCOPY W/ POLYPECTOMY  2010   Dr Kristie   DILATION AND CURETTAGE OF UTERUS  1990s   HARDWARE REMOVAL Left 10/31/2019   Procedure: LEFT LEG MEDIAL HARDWARE REMOVAL;  Surgeon: Elsa Lonni SAUNDERS, MD;  Location: [facility];  Service: Orthopedics;  Laterality: Left;  PROCEDURE: LEFT LEG MEDIAL HARDWARE REMOVAL LENGTH OF SURGERY: 1 HOUR   JOINT REPLACEMENT     LATISSIMUS FLAP TO BREAST Right 10/28/2016   Procedure: LATISSIMUS FLAP TO BREAST WITH PLACEMENT OF TISSUE EXPANDER ON THE RIGHT;  Surgeon: Filippo Arelia, MD;  Location: [facility];  Service: Plastics;  Laterality: Right;  RFNA   MASTECTOMY Right 10/28/2016   RECONSTRUCTION BREAST W/ LATISSIMUS DORSI FLAP Right 10/28/2016    WITH PLACEMENT OF TISSUE EXPANDER    REMOVAL OF BILATERAL TISSUE EXPANDERS WITH PLACEMENT OF BILATERAL BREAST IMPLANTS Right 03/07/2017   Procedure: REMOVAL OF RIGHT BREAST TISSUE EXPANDER WITH PLACEMENT OF RIGHT BREAST IMPLANT;  Surgeon: Arelia Filippo, MD;  Location: [facility];  Service: Plastics;  Laterality: Right;   REMOVAL OF TISSUE EXPANDER AND PLACEMENT OF IMPLANT Left 06/03/2022   Procedure: REMOVAL OF TISSUE EXPANDER AND PLACEMENT OF IMPLANT;  Surgeon: Arelia Filippo, MD;  Location: [facility];  Service: Plastics;  Laterality: Left;   SIMPLE MASTECTOMY WITH AXILLARY SENTINEL NODE BIOPSY Left 03/15/2022   Procedure: LEFT MASTECTOMY WITH INJECTION OF MAGTRACE;  Surgeon: Vernetta Berg, MD;  Location: [facility];  Service: General;  Laterality: Left;  LMA   TIBIA IM NAIL INSERTION Left 03/09/2019   Procedure: INTRAMEDULLARY (IM) NAIL TIBIAL;  Surgeon: Elsa Lonni SAUNDERS, MD;  Location: [facility];  Service: Orthopedics;  Laterality: Left;   TISSUE EXPANDER PLACEMENT Right 10/28/2016   Procedure: TISSUE EXPANDER;  Surgeon: Filippo Arelia, MD;  Location: [facility];  Service: Plastics;  Laterality: Right;   TOTAL HIP ARTHROPLASTY Right 05/12/2015   Procedure: RIGHT TOTAL HIP ARTHROPLASTY ANTERIOR APPROACH;  Surgeon: Donnice Car, MD;  Location: [facility];  Service: Orthopedics;  Laterality: Right;   TOTAL HIP ARTHROPLASTY Left 06/23/2015   Procedure: LEFT TOTAL HIP ARTHROPLASTY ANTERIOR APPROACH;  Surgeon: Donnice Car, MD;  Location: [facility];  Service: Orthopedics;  Laterality: Left;   TOTAL MASTECTOMY Right 10/28/2016   Procedure: RIGHT MASTECTOMY;  Surgeon: Alm Angle, MD;  Location: [facility] OR;  Service: General;  Laterality: Right;    OB History   No obstetric history on file.      Home Medications    Prior to Admission medications   Medication Sig Start Date End Date Taking? Authorizing Provider  amitriptyline  (ELAVIL ) 10 MG tablet TAKE 1 TABLET BY MOUTH AT BEDTIME 06/30/23   Wendling, Mabel Mt, DO  amLODipine  (NORVASC ) 5 MG tablet Take 1  tablet (5 mg total) by mouth daily. 10/03/22   Frann Mabel Mt, DO  amphetamine -dextroamphetamine  (ADDERALL) 20 MG tablet Take 20 mg by mouth daily. 06/21/18   [provider]  atorvastatin  (LIPITOR) 40 MG tablet TAKE 1 TABLET BY MOUTH DAILY 07/21/23   Frann Mabel Mt, DO  carvedilol  (COREG ) 25 MG tablet TAKE 1 TABLET BY MOUTH TWICE A DAY WITH A MEAL 11/24/22   Wendling, Mabel Mt, DO  glucose blood (ONETOUCH VERIO) test strip Use once daily to check blood sugar.  DX E11.9 01/25/21   Frann Mabel Mt, DO  hydrOXYzine  (ATARAX ) 25 MG tablet Take 0.5-2 tablets (12.5-50 mg total) by mouth 3 (three) times daily as needed for anxiety. 04/15/22   Frann Mabel Mt, DO  losartan -hydrochlorothiazide  (HYZAAR) 100-25 MG tablet TAKE 1 TABLET BY MOUTH DAILY 08/08/23   Frann Mabel Mt, DO  meloxicam  (MOBIC ) 15 MG  tablet Take 1 tablet (15 mg total) by mouth daily as needed for pain. 03/03/22   Frann Mabel Mt, DO  omeprazole  (PRILOSEC) 40 MG capsule TAKE 1 CAPSULE BY MOUTH DAILY 11/24/22   Wendling, Mabel Mt, DO  [facility] DELICA LANCETS FINE MISC Use as directed once daily to check blood sugar.  DXE11.9 04/27/18   Frann Mabel Mt, DO  Semaglutide , 2 MG/DOSE, (OZEMPIC , 2 MG/DOSE,) 8 MG/3ML SOPN DIAL AND INJECT 2 MG UNDER THE SKIN ONCE WEEKLY 06/14/23   Wendling, Mabel Mt, DO    Family History Family History  Problem Relation Age of Onset   Hypertension Mother    Diabetes Mother    Breast cancer Mother 60   Hypertension Sister    Hypertension Brother    Cancer Maternal Aunt        leukemia   Breast cancer Paternal Aunt        diagnosed in 57s or 87s   Cancer Paternal Uncle        unknown type   Cancer Paternal Aunt        unknown type   Breast cancer Cousin 76   Dementia Maternal Grandmother    Stroke Neg Hx    Heart disease Neg Hx     Social History Social History   Tobacco Use   Smoking status: Never   Smokeless tobacco: Never  Substance Use Topics   Alcohol use: No   Drug use: No     Allergies   Prednisone    Review of Systems Review of Systems  Constitutional:  Negative for chills, fatigue and fever.  HENT:  Positive for congestion, rhinorrhea and sore throat.   Respiratory:  Positive for cough. Negative for shortness of breath.   Cardiovascular:  Negative for chest pain.  Gastrointestinal:  Negative for diarrhea, nausea and vomiting.  Neurological:  Negative for headaches.     Physical Exam Triage Vital Signs ED Triage Vitals  Encounter Vitals Group     BP 08/16/23 1219 (!) 154/81     Systolic BP Percentile --      Diastolic BP Percentile --      Pulse Rate 08/16/23 1219 84     Resp 08/16/23 1219 17     Temp 08/16/23 1219 97.8 F (36.6 C)     Temp Source 08/16/23 1219 Oral     SpO2 08/16/23 1219 98 %     Weight --      Height --       Head Circumference --      Peak Flow --      Pain Score 08/16/23 1218  5     Pain Loc --      Pain Education --      Exclude from Growth Chart --    No data found.  Updated Vital Signs BP (!) 154/81 (BP Location: Right Arm)   Pulse 84   Temp 97.8 F (36.6 C) (Oral)   Resp 17   SpO2 98%   Visual Acuity Right Eye Distance:   Left Eye Distance:   Bilateral Distance:    Right Eye Near:   Left Eye Near:    Bilateral Near:     Physical Exam Vitals and nursing note reviewed.  Constitutional:      Appearance: She is not ill-appearing.  HENT:     Head: Normocephalic and atraumatic.     Right Ear: Tympanic membrane and ear canal normal.     Left Ear: Tympanic membrane and ear canal normal.     Nose: Congestion and rhinorrhea present.     Mouth/Throat:     Mouth: Mucous membranes are moist.     Pharynx: Oropharynx is clear. No oropharyngeal exudate or posterior oropharyngeal erythema.  Eyes:     Conjunctiva/sclera: Conjunctivae normal.  Cardiovascular:     Rate and Rhythm: Normal rate and regular rhythm.     Heart sounds: Normal heart sounds.  Pulmonary:     Effort: Pulmonary effort is normal. No respiratory distress.     Breath sounds: Normal breath sounds. No wheezing, rhonchi or rales.  Musculoskeletal:     Cervical back: Neck supple.  Lymphadenopathy:     Cervical: No cervical adenopathy.  Skin:    General: Skin is warm.  Neurological:     Mental Status: She is alert and oriented to person, place, and time.  Psychiatric:        Mood and Affect: Mood normal.      UC Treatments / Results  Labs (all labs ordered are listed, but only abnormal results are displayed) Labs Reviewed - No data to display  EKG   Radiology No results found.  Procedures Procedures (including critical care time)  Medications Ordered in UC Medications - No data to display  Initial Impression / Assessment and Plan / UC Course  I have reviewed the triage vital signs and the  nursing notes.  Pertinent labs & imaging results that were available during my care of the patient were reviewed by me and considered in my medical decision making (see chart for details).     63 year old with URI symptoms for several days, well-appearing, nontoxic, mildly hypertensive 154/81. No evidence of bacterial infection commend over-the-counter Coricidin HBP, follow-up as needed Final Clinical Impressions(s) / UC Diagnoses   Final diagnoses:  None   Discharge Instructions   None    ED Prescriptions   None    PDMP not reviewed this encounter.   Xavi Tomasik, [place] 08/16/23 1248

## 2023-08-16 NOTE — ED Triage Notes (Signed)
 Pt c/o congestion, headache, nausea, loss of appetite and runny nose for 4 days. States cough went away Monday.

## 2023-08-16 NOTE — Discharge Instructions (Signed)
 Over-the-counter Coricidin HBP products as directed on the package for symptoms

## 2023-08-18 ENCOUNTER — Ambulatory Visit (INDEPENDENT_AMBULATORY_CARE_PROVIDER_SITE_OTHER): Payer: 59 | Admitting: Family Medicine

## 2023-08-18 ENCOUNTER — Encounter: Payer: Self-pay | Admitting: Family Medicine

## 2023-08-18 VITALS — BP 126/80 | HR 80 | Temp 98.0°F | Resp 16 | Ht 65.0 in | Wt 192.0 lb

## 2023-08-18 DIAGNOSIS — J0110 Acute frontal sinusitis, unspecified: Secondary | ICD-10-CM | POA: Diagnosis not present

## 2023-08-18 MED ORDER — METHYLPREDNISOLONE ACETATE 80 MG/ML IJ SUSP
80.0000 mg | Freq: Once | INTRAMUSCULAR | Status: AC
  Administered 2023-08-18: 80 mg via INTRAMUSCULAR

## 2023-08-18 MED ORDER — DOXYCYCLINE HYCLATE 100 MG PO TABS: 100.0000 mg | ORAL_TABLET | Freq: Two times a day (BID) | ORAL | 0 refills | Status: AC

## 2023-08-18 NOTE — Patient Instructions (Addendum)
 Continue to push fluids, practice good hand hygiene, and cover your mouth if you cough.  If you start having fevers, shaking or shortness of breath, seek immediate care.  Consider an air humidifier.   OK to take Tylenol  1000 mg (2 extra strength tabs) or 975 mg (3 regular strength tabs) every 6 hours as needed.  Let us  know if you need anything.

## 2023-08-18 NOTE — Addendum Note (Signed)
 Addended by: Marian Sorrow D on: 08/18/2023 02:11 PM   Modules accepted: Orders

## 2023-08-18 NOTE — Progress Notes (Signed)
 Chief Complaint  Patient presents with   Follow-up    Follow up    Render JONELLE Cohens here for URI complaints.  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   Bulging lumbar disc    2   Cervical pain (neck)    De Quervain's tenosynovitis, bilateral    Ductal carcinoma in situ (DCIS) of right breast 1997; 2018   s/p chemotherapy and radiation treatment   Essential hypertension    08/22/11: BUN 15, creatinine 0.95, GFR 79      Generalized anxiety disorder    GERD (gastroesophageal reflux disease)    History of colonic polyps    History of migraine headaches    none in the last few years; had them mostly in late 20's/mid 30's   History of therapeutic radiation    Hypokalemia    Insomnia    Major depressive disorder    Mild neurocognitive disorder, unclear etiology 04/24/2020   OSA (obstructive sleep apnea) 02/19/2018   Awaiting CPAP machine as of 04/24/2020   Restless leg syndrome    Type 2 diabetes mellitus    Upper respiratory infection 07/16/2010    Objective BP 126/80   Pulse 80   Temp 98 F (36.7 C) (Oral)   Resp 16   Ht 5' 5 (1.651 m)   Wt 192 lb (87.1 kg)   SpO2 98%   BMI 31.95 kg/m  General: Awake, alert, appears stated age HEENT: AT, [place], ears patent b/l and TM's neg, nares patent w/o discharge, pharynx pink and without exudates, MMM, +ttp  Neck: No masses or asymmetry Heart: RRR Lungs: CTAB, no accessory muscle use Psych: Age appropriate judgment and insight, normal mood and affect  Acute frontal sinusitis,  recurrence not specified - Plan: doxycycline  (VIBRA -TABS) 100 MG tablet  Given worsening for >3 d, will send in 7 d of doxy. Continue to push fluids, practice good hand hygiene, cover mouth when coughing. F/u prn. If starting to experience fevers, shaking, or shortness of breath, seek immediate care. Pt voiced understanding and agreement to the plan.  Mabel Mt [place], DO 08/18/23 1:55 PM

## 2023-08-25 ENCOUNTER — Ambulatory Visit: Payer: 59 | Admitting: Family Medicine

## 2023-08-31 ENCOUNTER — Other Ambulatory Visit: Payer: Self-pay | Admitting: Family Medicine

## 2023-08-31 DIAGNOSIS — F411 Generalized anxiety disorder: Secondary | ICD-10-CM

## 2023-09-06 ENCOUNTER — Telehealth: Payer: Self-pay

## 2023-09-06 NOTE — Telephone Encounter (Signed)
Copied from CRM [id number]. Topic: Clinical - Medical Advice >> Sep 06, 2023 12:29 PM [place] wrote: Reason for CRM: Patient is still not feeling well after taking antibiotics, wanted to know if she can get another medication phoned in.

## 2023-09-07 ENCOUNTER — Ambulatory Visit: Payer: Self-pay | Admitting: Family Medicine

## 2023-09-07 ENCOUNTER — Other Ambulatory Visit: Payer: Self-pay | Admitting: Family Medicine

## 2023-09-07 ENCOUNTER — Ambulatory Visit: Payer: 59 | Admitting: Physician Assistant

## 2023-09-07 ENCOUNTER — Encounter: Payer: Self-pay | Admitting: Physician Assistant

## 2023-09-07 VITALS — BP 137/78 | HR 80 | Temp 97.9°F | Ht 65.0 in | Wt 191.2 lb

## 2023-09-07 DIAGNOSIS — E669 Obesity, unspecified: Secondary | ICD-10-CM

## 2023-09-07 DIAGNOSIS — J0101 Acute recurrent maxillary sinusitis: Secondary | ICD-10-CM

## 2023-09-07 DIAGNOSIS — R6889 Other general symptoms and signs: Secondary | ICD-10-CM

## 2023-09-07 DIAGNOSIS — E1169 Type 2 diabetes mellitus with other specified complication: Secondary | ICD-10-CM

## 2023-09-07 LAB — POCT INFLUENZA A/B
Influenza A, POC: NEGATIVE
Influenza B, POC: NEGATIVE

## 2023-09-07 MED ORDER — LORATADINE 10 MG PO TABS: 10.0000 mg | ORAL_TABLET | Freq: Every day | ORAL | 11 refills | Status: AC

## 2023-09-07 MED ORDER — AZELASTINE HCL 137 MCG/SPRAY NA SOLN: 1.0000 | Freq: Two times a day (BID) | NASAL | 1 refills | Status: AC

## 2023-09-07 MED ORDER — AZELASTINE HCL 137 MCG/SPRAY NA SOLN: 1.0000 | Freq: Two times a day (BID) | NASAL | 1 refills | Status: DC

## 2023-09-07 NOTE — Patient Instructions (Addendum)
Recommending guaifenesin over the counter and saline nasal rinse

## 2023-09-07 NOTE — Telephone Encounter (Signed)
Pt scheduled to see Alfredia Ferguson today

## 2023-09-07 NOTE — Progress Notes (Signed)
Established patient visit   Patient: Madeline Chandler   DOB: 08-31-1960   63 y.o. Female  MRN: [id number] Visit Date: 09/07/2023  Today's healthcare provider: Alfredia Ferguson, PA-C   Chief Complaint  Patient presents with   Cough    Present for-3-4 days Symptoms- headache, congestion, some ear pain. Cough with mucus. Runny nose.  OTC- none   Subjective     Pt was last seen 08/18/23 for sinusitis, given a steroid injection and prescribed doxycycline. Pt reports her symptoms never improved, persistent and copious nasal mucous, swelling, and now a new cough.  Denies fevers.  Medications: Outpatient Medications Prior to Visit  Medication Sig   amitriptyline (ELAVIL) 10 MG tablet TAKE 1 TABLET BY MOUTH AT BEDTIME   amLODipine (NORVASC) 5 MG tablet TAKE 1 TABLET BY MOUTH DAILY   amphetamine-dextroamphetamine (ADDERALL) 20 MG tablet Take 20 mg by mouth daily.   atorvastatin (LIPITOR) 40 MG tablet TAKE 1 TABLET BY MOUTH DAILY   carvedilol (COREG) 25 MG tablet TAKE 1 TABLET BY MOUTH TWICE A DAY WITH A MEAL   glucose blood (ONETOUCH VERIO) test strip Use once daily to check blood sugar.  DX E11.9   hydrOXYzine (ATARAX) 25 MG tablet Take 0.5-2 tablets (12.5-50 mg total) by mouth 3 (three) times daily as needed for anxiety.   losartan-hydrochlorothiazide (HYZAAR) 100-25 MG tablet TAKE 1 TABLET BY MOUTH DAILY   meloxicam (MOBIC) 15 MG tablet Take 1 tablet (15 mg total) by mouth daily as needed for pain.   omeprazole (PRILOSEC) 40 MG capsule TAKE 1 CAPSULE BY MOUTH DAILY   ONETOUCH DELICA LANCETS FINE MISC Use as directed once daily to check blood sugar.  DXE11.9   Semaglutide, 2 MG/DOSE, (OZEMPIC, 2 MG/DOSE,) 8 MG/3ML SOPN DIAL AND INJECT UNDER THE SKIN 2 MG WEEKLY   No facility-administered medications prior to visit.    Review of Systems  Constitutional:  Positive for fatigue. Negative for fever.  HENT:  Positive for congestion.   Respiratory:  Positive for cough. Negative for  shortness of breath.   Cardiovascular:  Negative for chest pain and leg swelling.  Gastrointestinal:  Negative for abdominal pain.  Neurological:  Negative for dizziness and headaches.       Objective    BP 137/78   Pulse 80   Temp 97.9 F (36.6 C) (Oral)   Ht 5\' 5"  (1.651 m)   Wt 191 lb 4 oz (86.8 kg)   SpO2 98%   BMI 31.83 kg/m    Physical Exam Constitutional:      General: She is awake.     Appearance: She is well-developed.  HENT:     Head: Normocephalic.     Right Ear: Tympanic membrane normal.     Left Ear: Tympanic membrane normal.     Nose:     Comments: Some facial swelling, sinus tenderness Eyes:     Conjunctiva/sclera: Conjunctivae normal.  Cardiovascular:     Rate and Rhythm: Normal rate and regular rhythm.     Heart sounds: Normal heart sounds.  Pulmonary:     Effort: Pulmonary effort is normal.     Breath sounds: Normal breath sounds.  Skin:    General: Skin is warm.  Neurological:     Mental Status: She is alert and oriented to person, place, and time.  Psychiatric:        Attention and Perception: Attention normal.        Mood and Affect: Mood normal.  Speech: Speech normal.        Behavior: Behavior is cooperative.    Results for orders placed or performed in visit on 09/07/23  POCT Influenza A/B  Result Value Ref Range   Influenza A, POC Negative Negative   Influenza B, POC Negative Negative    Assessment & Plan    Acute recurrent maxillary sinusitis -     Loratadine; Take 1 tablet (10 mg total) by mouth daily.  Dispense: 90 each; Refill: 11 -     Azelastine HCl; Place 1 spray into the nose in the morning and at bedtime.  Dispense: 30 mL; Refill: 1  Flu-like symptoms -     POCT Influenza A/B   Mucous is clear, advised taking mucinex or generic otc, using saline nasal sprays followed by azelastine, increase fluids.  If symptoms persist through the weekend, f/b with office  Return if symptoms worsen or fail to improve.        Alfredia Ferguson, PA-C  [facility] [phone] (phone) [phone] (fax)  [facility]

## 2023-09-07 NOTE — Telephone Encounter (Signed)
Chief Complaint: Cough Symptoms: Cough, congestion, nasal discharge, nausea. Frequency: frequent Pertinent Negatives: Patient denies fever, SOB, wheezing, sputum production. Disposition: [] ED /[] Urgent Care (no appt availability in office) / [x] Appointment(In office/virtual)/ []  [facility]/ [] Home Care/ [] Refused Recommended Disposition /[] [facility]/ []  Follow-up with PCP Additional Notes: Patient called with complaints of a cough that started about 3 or 4 days ago. Patients states she saw her provider about 2 1/2 weeks ago for sinusitis and was prescribed antibiotics and steroid injection, of which she as completed but now as developed a cough. Cough is non productive, but frequent, and patient could be heard having multiple coughing spells during triage. Patient states that she has congestion, headaches and chest soreness from the coughing, and sometimes gets nauseated when phlegm settles in her stomach after successful removal. Patient denies lung or cardiac history, wheezing, SOB, and fevers, and states she wants something prescribed so that she doesn't get bronchitis. Patient advised by this RN to be seen today to which patient was agreeable. Appt scheduled. Patient advised by this RN to call back if symptoms worse prior to appt. Patient verbalized understanding.  Copied from CRM [id number]. Topic: Clinical - Red Word Triage >> Sep 07, 2023 11:34 AM Denese Killings wrote: Red Word that prompted transfer to Nurse Triage:  Patient is starting to cough from her chest to her head, congested, ear aches, chest aches, and  no appetite. Reason for Disposition  SEVERE coughing spells (e.g., whooping sound after coughing, vomiting after coughing)  Answer Assessment - Initial Assessment Questions 1. ONSET: "When did the cough begin?"      Last 3 or 4 days. 2. SEVERITY: "How bad is the cough today?"      Severe 3. SPUTUM: "Describe the color of your sputum" (none, dry cough; clear,  white, yellow, green)     Denies 4. HEMOPTYSIS: "Are you coughing up any blood?" If so ask: "How much?" (flecks, streaks, tablespoons, etc.)     Denies 5. DIFFICULTY BREATHING: "Are you having difficulty breathing?" If Yes, ask: "How bad is it?" (e.g., mild, moderate, severe)    - MILD: No SOB at rest, mild SOB with walking, speaks normally in sentences, can lie down, no retractions, pulse < 100.    - MODERATE: SOB at rest, SOB with minimal exertion and prefers to sit, cannot lie down flat, speaks in phrases, mild retractions, audible wheezing, pulse 100-120.    - SEVERE: Very SOB at rest, speaks in single words, struggling to breathe, sitting hunched forward, retractions, pulse > 120      Denies 6. FEVER: "Do you have a fever?" If Yes, ask: "What is your temperature, how was it measured, and when did it start?"     Denies 7. CARDIAC HISTORY: "Do you have any history of heart disease?" (e.g., heart attack, congestive heart failure)      Denies 8. LUNG HISTORY: "Do you have any history of lung disease?"  (e.g., pulmonary embolus, asthma, emphysema)     Denies 9. PE RISK FACTORS: "Do you have a history of blood clots?" (or: recent major surgery, recent prolonged travel, bedridden)     Denies 10. OTHER SYMPTOMS: "Do you have any other symptoms?" (e.g., runny nose, wheezing, chest pain)       Runny nose (Clear drainage), nausea, headache, chest pain "It feels its from the coughing, you know how you get when you trying to get it out and you can't? Then the chest starts to get sore from coughing."  Protocols used: Cough - Acute Non-Productive-A-AH

## 2023-09-24 ENCOUNTER — Other Ambulatory Visit: Payer: Self-pay | Admitting: Family Medicine

## 2023-09-28 ENCOUNTER — Other Ambulatory Visit: Payer: Self-pay | Admitting: Family Medicine

## 2023-10-03 ENCOUNTER — Other Ambulatory Visit: Payer: Self-pay | Admitting: Family Medicine

## 2023-10-03 DIAGNOSIS — I10 Essential (primary) hypertension: Secondary | ICD-10-CM

## 2023-10-15 ENCOUNTER — Other Ambulatory Visit: Payer: Self-pay | Admitting: Family Medicine

## 2023-10-15 DIAGNOSIS — E1169 Type 2 diabetes mellitus with other specified complication: Secondary | ICD-10-CM

## 2023-10-28 ENCOUNTER — Other Ambulatory Visit: Payer: Self-pay | Admitting: Family Medicine

## 2023-10-28 DIAGNOSIS — F411 Generalized anxiety disorder: Secondary | ICD-10-CM

## 2023-11-08 ENCOUNTER — Other Ambulatory Visit: Payer: Self-pay | Admitting: Family Medicine

## 2023-11-08 DIAGNOSIS — I10 Essential (primary) hypertension: Secondary | ICD-10-CM

## 2023-11-23 ENCOUNTER — Other Ambulatory Visit: Payer: Self-pay | Admitting: Family Medicine

## 2023-11-23 ENCOUNTER — Encounter: Payer: Self-pay | Admitting: *Deleted

## 2023-11-28 ENCOUNTER — Other Ambulatory Visit: Payer: Self-pay | Admitting: Family Medicine

## 2023-11-28 DIAGNOSIS — E1169 Type 2 diabetes mellitus with other specified complication: Secondary | ICD-10-CM

## 2023-11-28 DIAGNOSIS — E669 Obesity, unspecified: Secondary | ICD-10-CM

## 2023-12-20 ENCOUNTER — Other Ambulatory Visit: Payer: Self-pay | Admitting: Family Medicine

## 2023-12-23 ENCOUNTER — Other Ambulatory Visit: Payer: Self-pay | Admitting: Family Medicine

## 2023-12-26 ENCOUNTER — Encounter: Payer: Self-pay | Admitting: *Deleted

## 2023-12-30 ENCOUNTER — Other Ambulatory Visit: Payer: Self-pay | Admitting: Family Medicine

## 2023-12-30 DIAGNOSIS — F411 Generalized anxiety disorder: Secondary | ICD-10-CM

## 2024-01-08 ENCOUNTER — Other Ambulatory Visit: Payer: Self-pay | Admitting: Family Medicine

## 2024-01-08 DIAGNOSIS — I10 Essential (primary) hypertension: Secondary | ICD-10-CM

## 2024-01-08 DIAGNOSIS — E1169 Type 2 diabetes mellitus with other specified complication: Secondary | ICD-10-CM

## 2024-01-10 ENCOUNTER — Ambulatory Visit: Payer: Self-pay

## 2024-01-10 ENCOUNTER — Encounter: Payer: Self-pay | Admitting: Family Medicine

## 2024-01-10 NOTE — Telephone Encounter (Signed)
 FYI Only or Action Required?: FYI only for provider  Patient was last seen in primary care on 09/07/2023. Called Nurse Triage reporting Insomnia. Symptoms began several months ago. Interventions attempted: Prescription medications: amitriptyline  hcl 10mg  Symptoms are: unchanged.  Triage Disposition: See PCP When Office is Open (Within 3 Days)- Scheduled  Patient/caregiver understands and will follow disposition?: Yes            Copied From CRM [id number]. Reason for Triage: Patient called reporting a history of trouble sleeping, which she stated has recently worsened. She mentioned that the prescribed medication has not been effective; even when taking half a tablet, she feels its effects carry into the next day. Patient reported only being able to sleep for one hour. While at work, she felt so fatigued she considered logging off for the day, but then suddenly experienced a burst of energy and was able to complete her shift. Patient is requesting that Vitamin B12 be prescribed for energy.   Reason for Disposition  [1] Insomnia persists > 1 week AND [2] no improvement after using Care Advice  Answer Assessment - Initial Assessment Questions 1. DESCRIPTION: "Tell me about your sleeping problem."      ---- Has problems falling asleep and staying asleep. ------- Was prescribed amitriptyline  hcl 10mg , but relays she is still feeling sleepy during the day.  -------- Pt has taken half the dosage and still feeling fatigue during the day.     2. ONSET: "How long have you been having trouble sleeping?" (e.g., days, weeks, months)     ------- Since last year.     3. RECURRENT: "Have you had sleeping problems before?"  If Yes, ask: "What happened that time?" "What helped your sleeping problem go away in the past?"      --------Yes   4. STRESS: "Is there anything in your life that is making you feel stressed or tense?"    -----Denies    5. PAIN: "Do you have any pain that is keeping you  awake?" (e.g., back pain, headache, abdomen pain)     -----------Denies     6. CAFFEINE ABUSE: "Do you drink caffeinated beverages, and how much each day?" (e.g., coffee, tea, colas)     --------- Denies    7. ALCOHOL USE OR SUBSTANCE USE (DRUG USE): "Do you drink alcohol or use any illegal drugs?"     -------- Denies    8. OTHER SYMPTOMS: "Do you have any other symptoms?"  (e.g., difficulty breathing)     ------- Denies    ADDITONAL INFO: ---- Not acute, MD aware- Was prescribed to assist with sleep amitriptyline  hcl 10mg   ----- Ultimately wants to switch to new medication. That will assist with sleeping but help her feel less groggy during the day.  ------ Grogginess is impacting her daily activities- Falling asleep at work.  ---- Inquiring about B12 pills for energy  Protocols used: Insomnia-A-AH

## 2024-01-10 NOTE — Telephone Encounter (Signed)
 Pt appt scheduled

## 2024-01-10 NOTE — Telephone Encounter (Signed)
 duplicate  Copied from CRM [id number]. Topic: Clinical - Pink Word Triage >> Jan 10, 2024 10:12 AM Madeline Chandler wrote: Reason for Triage: Patient called reporting a history of trouble sleeping, which she stated has recently worsened. She mentioned that the prescribed medication has not been effective; even when taking half a tablet, she feels its effects carry into the next day. Patient reported only being able to sleep for one hour. While at work, she felt so fatigued she considered logging off for the day, but then suddenly experienced a burst of energy and was able to complete her shift. Patient is requesting that Vitamin B12 be prescribed for energy.

## 2024-01-11 ENCOUNTER — Other Ambulatory Visit: Payer: Self-pay | Admitting: Family Medicine

## 2024-01-11 DIAGNOSIS — E1169 Type 2 diabetes mellitus with other specified complication: Secondary | ICD-10-CM

## 2024-01-12 ENCOUNTER — Ambulatory Visit (INDEPENDENT_AMBULATORY_CARE_PROVIDER_SITE_OTHER): Admitting: Family Medicine

## 2024-01-12 ENCOUNTER — Encounter: Payer: Self-pay | Admitting: Family Medicine

## 2024-01-12 VITALS — BP 138/84 | HR 94 | Temp 98.0°F | Resp 16 | Ht 65.0 in | Wt 195.0 lb

## 2024-01-12 DIAGNOSIS — F411 Generalized anxiety disorder: Secondary | ICD-10-CM

## 2024-01-12 DIAGNOSIS — G4700 Insomnia, unspecified: Secondary | ICD-10-CM | POA: Diagnosis not present

## 2024-01-12 MED ORDER — CITALOPRAM HYDROBROMIDE 10 MG PO TABS: 10.0000 mg | ORAL_TABLET | Freq: Every day | ORAL | 3 refills | Status: DC

## 2024-01-12 NOTE — Patient Instructions (Addendum)
 Aim to do some physical exertion for 150 minutes per week. This is typically divided into 5 days per week, 30 minutes per day. The activity should be enough to get your heart rate up. Anything is better than nothing if you have time constraints.  Consider B12 1000 mcg daily.   Take 1200 mg of calcium  daily and at least 1000 units of vitamin D3 daily.   Please consider counseling. Contact [phone] to schedule an appointment or inquire about cost/insurance coverage.  [facility] located at [address], [place], [place].  Phone number = [phone].  Dr. Dewight Fore - Adult Psychiatry.    [facility] located at [address] [place], [place], [place]. Phone number = [phone].   [facility] located at [address] [place], [place].  Phone number = [phone].   [facility] located at [address] [place], [place], [place].  Phone number = [phone].  Let us  know if you need anything.

## 2024-01-12 NOTE — Progress Notes (Signed)
 Chief Complaint  Patient presents with   Insomnia    Insomnia     Subjective: Patient is a 63 y.o. female here for insomnia.  Hx of chronic anxiety. Pt's anxiety has been higher over the past few weeks. No obvious trigger. She has been sleeping much worse. It is affecting her mentation and performance at work.  Has some racing thoughts. She is not seeing a therapist.   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   Bulging lumbar disc    2   Cervical pain (neck)    De Quervain's tenosynovitis, bilateral    Ductal carcinoma in situ (DCIS) of right breast 1997; 2018   s/p chemotherapy and radiation treatment   Essential hypertension    08/22/11: BUN 15, creatinine 0.95, GFR 79      Generalized anxiety disorder    GERD (gastroesophageal reflux disease)    History of colonic polyps    History of migraine headaches    "none in the last few years; had them mostly in late 20's/mid 30's"   History of therapeutic radiation    Hypokalemia    Insomnia    Major depressive disorder    Mild neurocognitive disorder, unclear etiology 04/24/2020   OSA (obstructive sleep apnea) 02/19/2018   Awaiting CPAP machine as of 04/24/2020   Restless leg syndrome    Type 2 diabetes mellitus    Upper respiratory infection 07/16/2010    Objective: BP 138/84 (BP Location: Left Arm, Patient Position: Sitting)   Pulse 94   Temp 98 F (36.7 C) (Oral)   Resp 16   Ht 5\' 5"  (1.651 m)   Wt 195 lb (88.5 kg)   SpO2 98%   BMI 32.45 kg/m  General: Awake, appears stated age Lungs:  No accessory muscle use Psych: Age appropriate judgment and insight, normal affect and mood  Assessment and Plan: Insomnia, unspecified type  GAD (generalized anxiety disorder) - Plan: citalopram  (CELEXA ) 10 MG tablet  Chronic, not stable. Counseling info provided.  Counseled on exercise.  F/u in 1 mo for a CPE and reck this.  The patient voiced understanding and agreement to the plan.  Shellie Dials [place], DO 01/12/24  4:40 PM

## 2024-01-26 ENCOUNTER — Other Ambulatory Visit: Payer: Self-pay | Admitting: Family Medicine

## 2024-01-28 ENCOUNTER — Other Ambulatory Visit: Payer: Self-pay | Admitting: Family Medicine

## 2024-02-07 ENCOUNTER — Encounter: Payer: Self-pay | Admitting: Family Medicine

## 2024-02-07 ENCOUNTER — Ambulatory Visit: Admitting: Family Medicine

## 2024-02-07 VITALS — BP 122/84 | HR 83 | Temp 98.0°F | Resp 16 | Ht 65.0 in | Wt 195.0 lb

## 2024-02-07 DIAGNOSIS — H6991 Unspecified Eustachian tube disorder, right ear: Secondary | ICD-10-CM | POA: Diagnosis not present

## 2024-02-07 LAB — POCT RAPID STREP A (OFFICE): Rapid Strep A Screen: NEGATIVE

## 2024-02-07 MED ORDER — METHYLPREDNISOLONE ACETATE 40 MG/ML IJ SUSP
40.0000 mg | Freq: Once | INTRAMUSCULAR | Status: AC
  Administered 2024-02-07: 40 mg via INTRAMUSCULAR

## 2024-02-07 NOTE — Patient Instructions (Signed)
OK to take Tylenol 1000 mg (2 extra strength tabs) or 975 mg (3 regular strength tabs) every 6 hours as needed.  Consider throat lozenges, salt water gargles and an air humidifier for symptomatic care.   Let us know if you need anything.

## 2024-02-07 NOTE — Progress Notes (Signed)
 Chief Complaint  Patient presents with   Sore Throat    Sore throat and ear ache    Render JONELLE Cohens here for URI complaints.  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   Bulging lumbar disc    2   Cervical pain (neck)    De Quervain's tenosynovitis, bilateral    Ductal carcinoma in situ (DCIS) of right breast 1997; 2018   s/p chemotherapy and radiation treatment   Essential hypertension    08/22/11: BUN 15, creatinine 0.95, GFR 79      Generalized anxiety disorder    GERD (gastroesophageal reflux disease)    History of colonic polyps    History of migraine headaches    none in the last few years; had them mostly in late 20's/mid 30's   History of therapeutic radiation    Hypokalemia    Insomnia    Major depressive disorder    Mild neurocognitive disorder, unclear etiology 04/24/2020   OSA (obstructive sleep apnea) 02/19/2018   Awaiting CPAP machine as of 04/24/2020   Restless leg syndrome    Type 2 diabetes mellitus    Upper respiratory infection 07/16/2010    Objective BP 122/84 (BP Location: Left Arm, Patient Position: Sitting)   Pulse 83   Temp 98 F (36.7 C) (Oral)   Resp 16   Ht 5' 5 (1.651 m)   Wt 195 lb (88.5 kg)   SpO2 98%   BMI 32.45 kg/m  General: Awake, alert, appears stated age HEENT: AT, [place], ears patent b/l and TM's neg, nares patent w/o discharge, pharynx pink and without exudates, MMM, mild ttp over R max sinuses Neck: No masses or asymmetry Heart: RRR Lungs: CTAB, no accessory muscle  use Psych: Age appropriate judgment and insight, normal mood and affect  Dysfunction of right eustachian tube  Depomedrol 80 mg IM today. INCS for future prevention. Continue to push fluids, practice good hand hygiene, cover mouth when coughing. F/u prn. If starting to experience fevers, shaking, or shortness of breath, seek immediate care. Pt voiced understanding and agreement to the plan.  Mabel [place], DO 02/07/24 9:39 AM

## 2024-02-07 NOTE — Addendum Note (Signed)
 Addended by: Courtlyn Aki M on: 02/07/2024 09:58 AM   Modules accepted: Orders

## 2024-02-21 ENCOUNTER — Other Ambulatory Visit: Payer: Self-pay | Admitting: Family Medicine

## 2024-02-21 DIAGNOSIS — E669 Obesity, unspecified: Secondary | ICD-10-CM

## 2024-02-21 DIAGNOSIS — E1169 Type 2 diabetes mellitus with other specified complication: Secondary | ICD-10-CM

## 2024-02-23 ENCOUNTER — Other Ambulatory Visit: Payer: Self-pay | Admitting: Family Medicine

## 2024-02-27 ENCOUNTER — Other Ambulatory Visit: Payer: Self-pay | Admitting: Family Medicine

## 2024-02-27 DIAGNOSIS — F411 Generalized anxiety disorder: Secondary | ICD-10-CM

## 2024-03-01 ENCOUNTER — Encounter: Payer: Self-pay | Admitting: Family Medicine

## 2024-03-01 ENCOUNTER — Ambulatory Visit (INDEPENDENT_AMBULATORY_CARE_PROVIDER_SITE_OTHER): Admitting: Family Medicine

## 2024-03-01 VITALS — BP 130/76 | HR 92 | Temp 98.0°F | Resp 16 | Ht 65.0 in | Wt 193.0 lb

## 2024-03-01 DIAGNOSIS — Z7985 Long-term (current) use of injectable non-insulin antidiabetic drugs: Secondary | ICD-10-CM

## 2024-03-01 DIAGNOSIS — E1169 Type 2 diabetes mellitus with other specified complication: Secondary | ICD-10-CM

## 2024-03-01 DIAGNOSIS — Z23 Encounter for immunization: Secondary | ICD-10-CM

## 2024-03-01 DIAGNOSIS — E669 Obesity, unspecified: Secondary | ICD-10-CM | POA: Diagnosis not present

## 2024-03-01 DIAGNOSIS — Z0000 Encounter for general adult medical examination without abnormal findings: Secondary | ICD-10-CM

## 2024-03-01 NOTE — Addendum Note (Signed)
 Addended by: ESTELLE GILLIS D on: 03/01/2024 04:09 PM   Modules accepted: Orders

## 2024-03-01 NOTE — Addendum Note (Signed)
 Addended by: TRECIA ALMARIE GRADE on: 03/01/2024 04:13 PM   Modules accepted: Orders

## 2024-03-01 NOTE — Patient Instructions (Addendum)
 Give us  2-3 business days to get the results of your labs back.   Keep the diet clean and stay active.  Please get me a copy of your advanced directive form at your convenience.   Consider chair yoga and yoga for your quads/hips/hamstrings.   Let us  know if you need anything.

## 2024-03-01 NOTE — Progress Notes (Signed)
 Chief Complaint  Patient presents with   Annual Exam    CPE     Well Woman Madeline Chandler is here for a complete physical.   Her last physical was >1 year ago.  Current diet: in general, diet is OK.  Current exercise: walking. Weight is stable and she denies fatigue out of ordinary. No LMP recorded. Patient has had a hysterectomy.  Seatbelt? Yes Advanced directive? 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   Bulging lumbar disc    2   Cervical pain (neck)    De Quervain's tenosynovitis, bilateral    Ductal carcinoma in situ (DCIS) of right breast 1997; 2018   s/p chemotherapy and radiation treatment   Essential hypertension    08/22/11: BUN 15, creatinine 0.95, GFR 79      Generalized anxiety disorder    GERD (gastroesophageal reflux disease)    History of colonic polyps    History of migraine headaches    none in the last few years; had them mostly in late 20's/mid 30's   History of therapeutic radiation    Hypokalemia    Insomnia    Major depressive disorder    Mild neurocognitive disorder, unclear etiology 04/24/2020   OSA (obstructive sleep apnea) 02/19/2018   Awaiting CPAP machine as of 04/24/2020   Restless leg syndrome    Type 2 diabetes mellitus    Upper respiratory infection 07/16/2010     Past Surgical History:  Procedure Laterality Date   ABDOMINAL HYSTERECTOMY  2000s   ANTERIOR CERVICAL DECOMP/DISCECTOMY FUSION N/A 12/02/2015   Procedure: ANTERIOR CERVICAL DECOMPRESSION/DISCECTOMY FUSION C4 - C7  3 LEVELS;  Surgeon: Donaciano Sprang, MD;  Location: [facility];  Service: Orthopedics;  Laterality: N/A;   BACK SURGERY     BREAST BIOPSY Right 1997;  08/2016   BREAST LUMPECTOMY Right 1997   BREAST RECONSTRUCTION Right 07/07/2017   Procedure: RIGHT NIPPLE AEROLA RECONSTRUCTION WITH LOCAL FLAP FROM RIGHT GROIN;  Surgeon: Arelia Filippo, MD;  Location: [facility];  Service: Plastics;  Laterality: Right;   BREAST RECONSTRUCTION WITH PLACEMENT OF TISSUE EXPANDER AND ALLODERM Left 03/15/2022   Procedure: LEFT BREAST RECONSTRUCTION WITH PLACEMENT OF TISSUE EXPANDER AND ALLODERM;  Surgeon: Arelia Filippo, MD;  Location: [facility];  Service: Plastics;  Laterality: Left;   BREAST REDUCTION SURGERY Left 03/07/2017   Procedure: LEFT BREAST REDUCTION FOR SYMMETRY;  Surgeon: Arelia Filippo, MD;  Location: [facility];  Service: Plastics;  Laterality: Left;   COLONOSCOPY W/ POLYPECTOMY  2010   Dr Kristie   DILATION AND CURETTAGE OF UTERUS  1990s   HARDWARE REMOVAL Left 10/31/2019   Procedure: LEFT LEG MEDIAL HARDWARE REMOVAL;  Surgeon: Elsa Lonni JONELLE, MD;  Location: [facility];  Service: Orthopedics;  Laterality: Left;  PROCEDURE: LEFT LEG MEDIAL HARDWARE REMOVAL LENGTH OF SURGERY: 1 HOUR   JOINT REPLACEMENT     LATISSIMUS FLAP TO BREAST Right 10/28/2016   Procedure: LATISSIMUS FLAP TO BREAST WITH PLACEMENT OF TISSUE EXPANDER ON THE RIGHT;  Surgeon: Filippo Arelia, MD;  Location: [facility];  Service: Plastics;  Laterality: Right;  RFNA   MASTECTOMY Right  10/28/2016   RECONSTRUCTION BREAST W/ LATISSIMUS DORSI FLAP Right 10/28/2016    WITH PLACEMENT OF TISSUE EXPANDER    REMOVAL OF BILATERAL TISSUE EXPANDERS WITH PLACEMENT OF BILATERAL BREAST IMPLANTS Right 03/07/2017   Procedure: REMOVAL OF RIGHT BREAST TISSUE EXPANDER WITH PLACEMENT OF RIGHT BREAST IMPLANT;  Surgeon: Arelia Filippo, MD;  Location: [facility];  Service: Plastics;  Laterality: Right;   REMOVAL OF TISSUE EXPANDER AND PLACEMENT OF IMPLANT Left 06/03/2022   Procedure: REMOVAL OF TISSUE EXPANDER AND PLACEMENT OF  IMPLANT;  Surgeon: Arelia Filippo, MD;  Location: [facility];  Service: Plastics;  Laterality: Left;   SIMPLE MASTECTOMY WITH AXILLARY SENTINEL NODE BIOPSY Left 03/15/2022   Procedure: LEFT MASTECTOMY WITH INJECTION OF MAGTRACE;  Surgeon: Vernetta Berg, MD;  Location:  SURGERY CENTER;  Service: General;  Laterality: Left;  LMA   TIBIA IM NAIL INSERTION Left 03/09/2019   Procedure: INTRAMEDULLARY (IM) NAIL TIBIAL;  Surgeon: Elsa Lonni SAUNDERS, MD;  Location: [facility];  Service: Orthopedics;  Laterality: Left;   TISSUE EXPANDER PLACEMENT Right 10/28/2016   Procedure: TISSUE EXPANDER;  Surgeon: Filippo Arelia, MD;  Location: [facility];  Service: Plastics;  Laterality: Right;   TOTAL HIP ARTHROPLASTY Right 05/12/2015   Procedure: RIGHT TOTAL HIP ARTHROPLASTY ANTERIOR APPROACH;  Surgeon: Donnice Car, MD;  Location: [facility];  Service: Orthopedics;  Laterality: Right;   TOTAL HIP ARTHROPLASTY Left 06/23/2015   Procedure: LEFT TOTAL HIP ARTHROPLASTY ANTERIOR APPROACH;  Surgeon: Donnice Car, MD;  Location: [facility];  Service: Orthopedics;  Laterality: Left;   TOTAL MASTECTOMY Right 10/28/2016   Procedure: RIGHT MASTECTOMY;  Surgeon: Alm Angle, MD;  Location: [facility];  Service: General;  Laterality: Right;    Medications  Current Outpatient Medications on File Prior to Visit  Medication Sig Dispense Refill   amitriptyline  (ELAVIL ) 10 MG tablet TAKE 1 TABLET BY MOUTH AT BEDTIME 30 tablet 1   amLODipine  (NORVASC ) 5 MG tablet TAKE 1 TABLET BY MOUTH DAILY 30 tablet 1   amphetamine -dextroamphetamine  (ADDERALL) 20 MG tablet Take 20 mg by mouth daily.  0   atorvastatin  (LIPITOR) 40 MG tablet TAKE 1 TABLET BY MOUTH DAILY 90 tablet 0   Azelastine  HCl 137 MCG/SPRAY SOLN Place 1 spray into the nose in the morning and at bedtime. 30 mL 1   carvedilol  (COREG ) 25 MG tablet TAKE 1 TABLET BY MOUTH 2 TIMES A DAY WITH A MEAL 60 tablet 2   citalopram  (CELEXA ) 10 MG tablet Take 1 tablet (10 mg total)  by mouth daily. 30 tablet 3   glucose blood (ONETOUCH VERIO) test strip Use once daily to check blood sugar.  DX E11.9 100 each 1   loratadine  (CLARITIN ) 10 MG tablet Take 1 tablet (10 mg total) by mouth daily. 90 each 11   losartan -hydrochlorothiazide  (HYZAAR) 100-25 MG tablet TAKE 1 TABLET BY MOUTH DAILY 90 tablet 1   meloxicam  (MOBIC ) 15 MG tablet Take 1 tablet (15 mg total) by mouth daily as needed for pain. 30 tablet 0   omeprazole  (PRILOSEC) 40 MG capsule TAKE 1 CAPSULE BY MOUTH DAILY 30 capsule 0   ONETOUCH DELICA LANCETS FINE MISC Use as directed once daily to check blood sugar.  DXE11.9 100 each 1   OZEMPIC , 2 MG/DOSE, 8 MG/3ML SOPN DIAL AND INJECT UNDER THE SKIN 2 MG WEEKLY 3 mL 2   Allergies Allergies  Allergen Reactions   Prednisone  Other (See Comments)    headaches    Review of Systems: Constitutional:  no unexpected weight changes Eye:  no recent significant change in vision Ear/Nose/Mouth/Throat:  Ears:  no recent change in hearing Nose/Mouth/Throat:  no complaints of nasal congestion, no sore throat Cardiovascular: no chest pain Respiratory:  no shortness of breath Gastrointestinal:  no abdominal pain, no change in bowel habits GU:  Female: negative for dysuria or pelvic pain Musculoskeletal/Extremities:  no pain of the joints Integumentary (Skin/Breast):  no abnormal skin lesions reported Neurologic:  no headaches Endocrine:  denies fatigue  Exam BP 130/76 (BP Location: Left Arm, Patient Position: Sitting)   Pulse 92   Temp 98 F (36.7 C) (Oral)   Resp 16   Ht 5' 5 (1.651 m)   Wt 193 lb (87.5 kg)   SpO2 97%   BMI 32.12 kg/m  General:  well developed, well nourished, in no apparent distress Skin:  no significant moles, warts, or growths Head:  no masses, lesions, or tenderness Eyes:  pupils equal and round, sclera anicteric without injection Ears:  canals without lesions, TMs shiny without retraction, no obvious effusion, no erythema Nose:  nares patent,  mucosa normal, and no drainage  Throat/Pharynx:  lips and gingiva without lesion; tongue and uvula midline; non-inflamed pharynx; no exudates or postnasal drainage Neck: neck supple without adenopathy, thyromegaly, or masses Lungs:  clear to auscultation, breath sounds equal bilaterally, no respiratory distress Cardio: DP pulses 2+ b/l, regular rate and rhythm, no LE edema Abdomen:  abdomen soft, nontender; bowel sounds normal; no masses or organomegaly Genital: Defer to GYN Musculoskeletal:  symmetrical muscle groups noted without atrophy or deformity Extremities:  no clubbing, cyanosis, or edema, no deformities, no skin discoloration Neuro: sensation intact to pinprick on feet; gait normal; deep tendon reflexes normal and symmetric Psych: well oriented with normal range of affect and appropriate judgment/insight  Assessment and Plan  Well adult exam - Plan: CBC, Comprehensive metabolic panel with GFR, Lipid panel  Type 2 diabetes mellitus with obesity (HCC) - Plan: Hemoglobin A1c, Microalbumin / creatinine urine ratio   Well 63 y.o. female. Counseled on diet and exercise. Tdap today. Advanced directive form provided today.  Other orders as above. Follow up in 6 mo. The patient voiced understanding and agreement to the plan.  Mabel Mt [place], DO 03/01/24 3:56 PM

## 2024-03-02 ENCOUNTER — Ambulatory Visit: Payer: Self-pay | Admitting: Family Medicine

## 2024-03-02 LAB — MICROALBUMIN / CREATININE URINE RATIO
Creatinine, Urine: 193 mg/dL (ref 20–275)
Microalb Creat Ratio: 4 mg/g{creat} (ref <30)
Microalb, Ur: 0.8 mg/dL

## 2024-03-04 ENCOUNTER — Other Ambulatory Visit (INDEPENDENT_AMBULATORY_CARE_PROVIDER_SITE_OTHER)

## 2024-03-04 ENCOUNTER — Other Ambulatory Visit: Payer: Self-pay

## 2024-03-04 DIAGNOSIS — E669 Obesity, unspecified: Secondary | ICD-10-CM | POA: Diagnosis not present

## 2024-03-04 DIAGNOSIS — E1169 Type 2 diabetes mellitus with other specified complication: Secondary | ICD-10-CM

## 2024-03-04 DIAGNOSIS — E876 Hypokalemia: Secondary | ICD-10-CM

## 2024-03-04 DIAGNOSIS — Z0000 Encounter for general adult medical examination without abnormal findings: Secondary | ICD-10-CM

## 2024-03-04 LAB — COMPREHENSIVE METABOLIC PANEL WITH GFR
ALT: 27 U/L (ref 0–35)
AST: 20 U/L (ref 0–37)
Albumin: 4 g/dL (ref 3.5–5.2)
Alkaline Phosphatase: 94 U/L (ref 39–117)
BUN: 18 mg/dL (ref 6–23)
CO2: 35 meq/L — ABNORMAL HIGH (ref 19–32)
Calcium: 9.8 mg/dL (ref 8.4–10.5)
Chloride: 100 meq/L (ref 96–112)
Creatinine, Ser: 0.92 mg/dL (ref 0.40–1.20)
GFR: 66.02 mL/min (ref >60.00)
Glucose, Bld: 97 mg/dL (ref 70–99)
Potassium: 3.4 meq/L — ABNORMAL LOW (ref 3.5–5.1)
Sodium: 140 meq/L (ref 135–145)
Total Bilirubin: 0.8 mg/dL (ref 0.2–1.2)
Total Protein: 7 g/dL (ref 6.0–8.3)

## 2024-03-04 LAB — LIPID PANEL
Cholesterol: 128 mg/dL (ref 0–200)
HDL: 57.8 mg/dL (ref >39.00)
LDL Cholesterol: 61 mg/dL (ref 0–99)
NonHDL: 70.53
Total CHOL/HDL Ratio: 2
Triglycerides: 50 mg/dL (ref 0.0–149.0)
VLDL: 10 mg/dL (ref 0.0–40.0)

## 2024-03-04 LAB — CBC
HCT: 38.8 % (ref 36.0–46.0)
Hemoglobin: 12.8 g/dL (ref 12.0–15.0)
MCHC: 33.1 g/dL (ref 30.0–36.0)
MCV: 88.8 fl (ref 78.0–100.0)
Platelets: 197 K/uL (ref 150.0–400.0)
RBC: 4.37 Mil/uL (ref 3.87–5.11)
RDW: 13.9 % (ref 11.5–15.5)
WBC: 4.5 K/uL (ref 4.0–10.5)

## 2024-03-04 LAB — HEMOGLOBIN A1C: Hgb A1c MFr Bld: 6.5 % (ref 4.6–6.5)

## 2024-03-08 ENCOUNTER — Other Ambulatory Visit (INDEPENDENT_AMBULATORY_CARE_PROVIDER_SITE_OTHER)

## 2024-03-08 DIAGNOSIS — E876 Hypokalemia: Secondary | ICD-10-CM | POA: Diagnosis not present

## 2024-03-08 LAB — BASIC METABOLIC PANEL WITH GFR
BUN: 17 mg/dL (ref 7–25)
CO2: 32 mmol/L (ref 20–32)
Calcium: 10.5 mg/dL — ABNORMAL HIGH (ref 8.6–10.4)
Chloride: 98 mmol/L (ref 98–110)
Creat: 0.91 mg/dL (ref 0.50–1.05)
Glucose, Bld: 94 mg/dL (ref 65–99)
Potassium: 3.5 mmol/L (ref 3.5–5.3)
Sodium: 138 mmol/L (ref 135–146)
eGFR: 70 mL/min/1.73m2 (ref >=60)

## 2024-03-08 LAB — MAGNESIUM: Magnesium: 2.3 mg/dL (ref 1.5–2.5)

## 2024-03-08 NOTE — Addendum Note (Signed)
 Addended by: TRUDY CURVIN RAMAN on: 03/08/2024 02:27 PM   Modules accepted: Orders

## 2024-03-09 ENCOUNTER — Ambulatory Visit: Payer: Self-pay | Admitting: Family Medicine

## 2024-03-18 ENCOUNTER — Ambulatory Visit: Payer: Self-pay

## 2024-03-18 NOTE — Telephone Encounter (Signed)
 Pt scheduled tomorrow with Jason, NP

## 2024-03-18 NOTE — Telephone Encounter (Signed)
 FYI Only or Action Required?: Action required by provider: request for appointment.  Patient was last seen in primary care on 03/01/2024 by Frann Mabel Mt, DO.  Called Nurse Triage reporting Chest Pain.  Symptoms began today.  Interventions attempted: OTC medications: ibuprofen.  Symptoms are: unchanged.  Triage Disposition: See Physician Within 24 Hours  Patient/caregiver understands and will follow disposition?: YesCopied from CRM #[id number]. Topic: Clinical - Red Word Triage >> Mar 18, 2024  2:48 PM Thersia BROCKS wrote: [facility] that prompted transfer to Nurse Triage: Patient called in stated since 5 oclock this morning , pains in right side in breast bone , like a ache not sure if she has pull something, hurts when she breaths in . Blood pressure is not high . Been having cramps in legs especially right Reason for Disposition  [1] Chest pain lasts < 5 minutes AND [2] NO chest pain or cardiac symptoms (e.g., breathing difficulty, sweating) now  (Exception: Chest pains that last only a few seconds.)  Answer Assessment - Initial Assessment Questions Pt described pain as coughing sore. I was walking 2 strong dogs yesterday and they were pulling me. Not sure if  I pulled muscle.  Pt has taken ibuprofen earlier today and no relief.BP wasn't too high. Like 140/80. I kinda hurts to breathe deep. Pt denies chest pain and SOB. Pt doesn't not sound in distress     1. LOCATION: Where does it hurt?       Right side of breast bone 2. RADIATION: Does the pain go anywhere else? (e.g., into neck, jaw, arms, back)     Back  3. ONSET: When did the chest pain begin? (Minutes, hours or days)      5am this morning 4. PATTERN: Does the pain come and go, or has it been constant since it started?  Does it get worse with exertion?      Constant  5. DURATION: How long does it last (e.g., seconds, minutes, hours)     na 6. SEVERITY: How bad is the pain?  (e.g., Scale 1-10; mild,  moderate, or severe)     5-6 7. CARDIAC RISK FACTORS: Do you have any history of heart problems or risk factors for heart disease? (e.g., angina, prior heart attack; diabetes, high blood pressure, high cholesterol, smoker, or strong family history of heart disease)     DM, high bp,  8. PULMONARY RISK FACTORS: Do you have any history of lung disease?  (e.g., blood clots in lung, asthma, emphysema, birth control pills)     denies 9. CAUSE: What do you think is causing the chest pain?     Not sure 10. OTHER SYMPTOMS: Do you have any other symptoms? (e.g., dizziness, nausea, vomiting, sweating, fever, difficulty breathing, cough)       Foot/right leg cramp, headache since yesterday in middle of head  Protocols used: Chest Pain-A-AH

## 2024-03-19 ENCOUNTER — Ambulatory Visit (INDEPENDENT_AMBULATORY_CARE_PROVIDER_SITE_OTHER): Admitting: Family

## 2024-03-19 ENCOUNTER — Encounter: Payer: Self-pay | Admitting: Family

## 2024-03-19 VITALS — BP 136/78 | HR 86 | Ht 65.0 in | Wt 192.2 lb

## 2024-03-19 DIAGNOSIS — R0789 Other chest pain: Secondary | ICD-10-CM | POA: Diagnosis not present

## 2024-03-19 MED ORDER — MELOXICAM 15 MG PO TABS: 15.0000 mg | ORAL_TABLET | Freq: Every day | ORAL | 0 refills | Status: AC | PRN

## 2024-03-19 NOTE — Progress Notes (Signed)
 Madeline Chandler is a 63 y.o. female with the following history as recorded in Epic[employer]:  Patient Active Problem List   Diagnosis Date Noted   Ductal carcinoma in situ (DCIS) of left breast 03/15/2022   Moderate episode of recurrent major depressive disorder (HCC) 01/18/2021   History of therapeutic radiation 04/24/2020   Mild neurocognitive disorder, unclear etiology 04/24/2020   Insomnia 02/04/2020   Soft tissue mass 08/06/2019   Syncope 03/09/2019   Hypokalemia 03/09/2019   Greater trochanteric bursitis of left hip 08/09/2018   Left wrist pain 06/18/2018   OSA (obstructive sleep apnea) 02/19/2018   Chronic cough 02/19/2018   Type 2 diabetes mellitus with obesity (HCC) 01/26/2018   De Quervain's tenosynovitis, bilateral 11/16/2017   Ductal carcinoma in situ (DCIS) of right breast 09/16/2016   Neck pain 12/02/2015   Preoperative clearance 11/10/2015   Peripheral edema 10/05/2015   Cervical pain (neck) 10/05/2015   S/P left THA, AA 06/23/2015   Obesity (BMI 30-39.9) 05/13/2015   Muscle spasm 02/27/2015   Pain in joint, shoulder region 09/05/2014   Leg swelling 12/08/2013   Other abnormal glucose 02/23/2012   Nonspecific abnormal electrocardiogram (ECG) (EKG) 02/23/2012   GERD (gastroesophageal reflux disease) 10/23/2009   GAD (generalized anxiety disorder) 09/02/2009   Major depressive disorder 07/10/2008   History of breast cancer 07/10/2008   History of colonic polyps 07/10/2008   Fibroids, uterus 02/15/2008   History of dilation and curettage 02/15/2008   Essential hypertension 09/25/2007    Current Outpatient Medications  Medication Sig Dispense Refill   amitriptyline  (ELAVIL ) 10 MG tablet TAKE 1 TABLET BY MOUTH AT BEDTIME 30 tablet 1   amLODipine  (NORVASC ) 5 MG tablet TAKE 1 TABLET BY MOUTH DAILY 30 tablet 1   amphetamine -dextroamphetamine  (ADDERALL) 20 MG tablet Take 20 mg by mouth daily.  0   atorvastatin  (LIPITOR) 40 MG tablet TAKE 1 TABLET BY MOUTH DAILY 90 tablet  0   Azelastine  HCl 137 MCG/SPRAY SOLN Place 1 spray into the nose in the morning and at bedtime. 30 mL 1   carvedilol  (COREG ) 25 MG tablet TAKE 1 TABLET BY MOUTH 2 TIMES A DAY WITH A MEAL 60 tablet 2   citalopram  (CELEXA ) 10 MG tablet Take 1 tablet (10 mg total) by mouth daily. 30 tablet 3   glucose blood (ONETOUCH VERIO) test strip Use once daily to check blood sugar.  DX E11.9 100 each 1   loratadine  (CLARITIN ) 10 MG tablet Take 1 tablet (10 mg total) by mouth daily. 90 each 11   losartan -hydrochlorothiazide  (HYZAAR) 100-25 MG tablet TAKE 1 TABLET BY MOUTH DAILY 90 tablet 1   omeprazole  (PRILOSEC) 40 MG capsule TAKE 1 CAPSULE BY MOUTH DAILY 30 capsule 0   ONETOUCH DELICA LANCETS FINE MISC Use as directed once daily to check blood sugar.  DXE11.9 100 each 1   OZEMPIC , 2 MG/DOSE, 8 MG/3ML SOPN DIAL AND INJECT UNDER THE SKIN 2 MG WEEKLY 3 mL 2   meloxicam  (MOBIC ) 15 MG tablet Take 1 tablet (15 mg total) by mouth daily as needed for pain. 30 tablet 0   No current facility-administered medications for this visit.    Allergies: Prednisone   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   Bulging lumbar disc    2   Cervical pain (  neck)    De Quervain's tenosynovitis, bilateral    Ductal carcinoma in situ (DCIS) of right breast 1997; 2018   s/p chemotherapy and radiation treatment   Essential hypertension    08/22/11: BUN 15, creatinine 0.95, GFR 79      Generalized anxiety disorder    GERD (gastroesophageal reflux disease)    History of colonic polyps    History of migraine headaches    none in the last few years; had them mostly in late 20's/mid 30's   History of therapeutic radiation    Hypokalemia    Insomnia    Major depressive disorder    Mild neurocognitive disorder, unclear etiology 04/24/2020   OSA (obstructive sleep  apnea) 02/19/2018   Awaiting CPAP machine as of 04/24/2020   Restless leg syndrome    Type 2 diabetes mellitus    Upper respiratory infection 07/16/2010    Past Surgical History:  Procedure Laterality Date   ABDOMINAL HYSTERECTOMY  2000s   ANTERIOR CERVICAL DECOMP/DISCECTOMY FUSION N/A 12/02/2015   Procedure: ANTERIOR CERVICAL DECOMPRESSION/DISCECTOMY FUSION C4 - C7  3 LEVELS;  Surgeon: Donaciano Sprang, MD;  Location: [facility];  Service: Orthopedics;  Laterality: N/A;   BACK SURGERY     BREAST BIOPSY Right 1997; 08/2016   BREAST LUMPECTOMY Right 1997   BREAST RECONSTRUCTION Right 07/07/2017   Procedure: RIGHT NIPPLE AEROLA RECONSTRUCTION WITH LOCAL FLAP FROM RIGHT GROIN;  Surgeon: Arelia Filippo, MD;  Location: [facility];  Service: Plastics;  Laterality: Right;   BREAST RECONSTRUCTION WITH PLACEMENT OF TISSUE EXPANDER AND ALLODERM Left 03/15/2022   Procedure: LEFT BREAST RECONSTRUCTION WITH PLACEMENT OF TISSUE EXPANDER AND ALLODERM;  Surgeon: Arelia Filippo, MD;  Location: [facility];  Service: Plastics;  Laterality: Left;   BREAST REDUCTION SURGERY Left 03/07/2017   Procedure: LEFT BREAST REDUCTION FOR SYMMETRY;  Surgeon: Arelia Filippo, MD;  Location: [facility];  Service: Plastics;  Laterality: Left;   COLONOSCOPY W/ POLYPECTOMY  2010   Dr Kristie   DILATION AND CURETTAGE OF UTERUS  1990s   HARDWARE REMOVAL Left 10/31/2019   Procedure: LEFT LEG MEDIAL HARDWARE REMOVAL;  Surgeon: Elsa Lonni SAUNDERS, MD;  Location: [facility];  Service: Orthopedics;  Laterality: Left;  PROCEDURE: LEFT LEG MEDIAL HARDWARE REMOVAL LENGTH OF SURGERY: 1 HOUR   JOINT REPLACEMENT     LATISSIMUS FLAP TO BREAST Right 10/28/2016   Procedure: LATISSIMUS FLAP TO BREAST WITH PLACEMENT OF TISSUE EXPANDER ON THE RIGHT;  Surgeon: Filippo Arelia, MD;  Location: [facility];  Service: Plastics;  Laterality: Right;  RFNA   MASTECTOMY Right 10/28/2016   RECONSTRUCTION  BREAST W/ LATISSIMUS DORSI FLAP Right 10/28/2016    WITH PLACEMENT OF TISSUE EXPANDER    REMOVAL OF BILATERAL TISSUE EXPANDERS WITH PLACEMENT OF BILATERAL BREAST IMPLANTS Right 03/07/2017   Procedure: REMOVAL OF RIGHT BREAST TISSUE EXPANDER WITH PLACEMENT OF RIGHT BREAST IMPLANT;  Surgeon: Arelia Filippo, MD;  Location: [facility];  Service: Plastics;  Laterality: Right;   REMOVAL OF TISSUE EXPANDER AND PLACEMENT OF IMPLANT Left 06/03/2022   Procedure: REMOVAL OF TISSUE EXPANDER AND PLACEMENT OF IMPLANT;  Surgeon: Arelia Filippo, MD;  Location: [facility];  Service: Plastics;  Laterality: Left;   SIMPLE MASTECTOMY WITH AXILLARY SENTINEL NODE BIOPSY Left 03/15/2022   Procedure: LEFT MASTECTOMY WITH INJECTION OF MAGTRACE;  Surgeon: Vernetta Berg, MD;  Location: [facility];  Service: General;  Laterality: Left;  LMA   TIBIA IM NAIL INSERTION  Left 03/09/2019   Procedure: INTRAMEDULLARY (IM) NAIL TIBIAL;  Surgeon: Elsa Lonni SAUNDERS, MD;  Location: [facility];  Service: Orthopedics;  Laterality: Left;   TISSUE EXPANDER PLACEMENT Right 10/28/2016   Procedure: TISSUE EXPANDER;  Surgeon: Earlis Ranks, MD;  Location: [facility];  Service: Plastics;  Laterality: Right;   TOTAL HIP ARTHROPLASTY Right 05/12/2015   Procedure: RIGHT TOTAL HIP ARTHROPLASTY ANTERIOR APPROACH;  Surgeon: Donnice Car, MD;  Location: [facility];  Service: Orthopedics;  Laterality: Right;   TOTAL HIP ARTHROPLASTY Left 06/23/2015   Procedure: LEFT TOTAL HIP ARTHROPLASTY ANTERIOR APPROACH;  Surgeon: Donnice Car, MD;  Location: [facility];  Service: Orthopedics;  Laterality: Left;   TOTAL MASTECTOMY Right 10/28/2016   Procedure: RIGHT MASTECTOMY;  Surgeon: Alm Angle, MD;  Location: [facility] OR;  Service: General;  Laterality: Right;    Family History  Problem Relation Age of Onset   Hypertension Mother    Diabetes Mother    Breast cancer Mother 48   Hypertension Sister    Hypertension Brother     Cancer Maternal Aunt        leukemia   Breast cancer Paternal Aunt        diagnosed in 74s or 48s   Cancer Paternal Uncle        unknown type   Cancer Paternal Aunt        unknown type   Breast cancer Cousin 57   Dementia Maternal Grandmother    Stroke Neg Hx    Heart disease Neg Hx     Social History   Tobacco Use   Smoking status: Never   Smokeless tobacco: Never  Substance Use Topics   Alcohol use: No    Subjective:   Woke up yesterday morning with sensation of soreness in her chest; no chest pain or shortness of breath on exertion; notes she has been walking 2 dogs for the past few days and notes these dogs were harder to walk; does note the dogs were tugging and pulling with the walk; did take some Ibuprofen yesterday and notes that symptoms are better today;     Objective:  Vitals:   03/19/24 1117  BP: 136/78  Pulse: 86  SpO2: 97%  Weight: 192 lb 3.2 oz (87.2 kg)  Height: 5' 5 (1.651 m)    General: Well developed, well nourished, in no acute distress  Skin : Warm and dry.  Head: Normocephalic and atraumatic  Eyes: Sclera and conjunctiva clear; pupils round and reactive to light; extraocular movements intact  Ears: External normal; canals clear; tympanic membranes normal  Oropharynx: Pink, supple. No suspicious lesions  Neck: Supple without thyromegaly, adenopathy  Lungs: Respirations unlabored; clear to auscultation bilaterally without wheeze, rales, rhonchi  CVS exam: normal rate and regular rhythm.  Neurologic: Alert and oriented; speech intact; face symmetrical; moves all extremities well; CNII-XII intact without focal deficit  Localized area on right lateral side of chest at juncture with right shoulder is mildly tender to touch  Assessment:  1. Atypical chest pain     Plan:  Suspect muscular; EKG showed NSR; physical exam is reassuring- will hold CXR at this time. Trial of Mobic  15 mg daily; can apply heating pad to affected area; follow up worse, no  better.   No follow-ups on file.  Orders Placed This Encounter  Procedures   EKG 12-Lead    Requested Prescriptions   Signed Prescriptions Disp Refills   meloxicam  (MOBIC ) 15 MG tablet 30 tablet 0    Sig: Take  1 tablet (15 mg total) by mouth daily as needed for pain.

## 2024-03-23 ENCOUNTER — Other Ambulatory Visit: Payer: Self-pay | Admitting: Family Medicine

## 2024-04-01 ENCOUNTER — Inpatient Hospital Stay: Payer: 59 | Attending: Oncology | Admitting: Oncology

## 2024-04-01 VITALS — BP 140/71 | HR 81 | Temp 97.9°F | Resp 18 | Ht 65.0 in | Wt 193.9 lb

## 2024-04-01 DIAGNOSIS — Z853 Personal history of malignant neoplasm of breast: Secondary | ICD-10-CM | POA: Diagnosis not present

## 2024-04-01 DIAGNOSIS — Z08 Encounter for follow-up examination after completed treatment for malignant neoplasm: Secondary | ICD-10-CM | POA: Diagnosis present

## 2024-04-01 DIAGNOSIS — Z9013 Acquired absence of bilateral breasts and nipples: Secondary | ICD-10-CM | POA: Diagnosis not present

## 2024-04-01 DIAGNOSIS — Z923 Personal history of irradiation: Secondary | ICD-10-CM | POA: Insufficient documentation

## 2024-04-01 DIAGNOSIS — D0512 Intraductal carcinoma in situ of left breast: Secondary | ICD-10-CM | POA: Diagnosis not present

## 2024-04-01 DIAGNOSIS — Z9221 Personal history of antineoplastic chemotherapy: Secondary | ICD-10-CM | POA: Diagnosis not present

## 2024-04-01 DIAGNOSIS — Z90710 Acquired absence of both cervix and uterus: Secondary | ICD-10-CM | POA: Insufficient documentation

## 2024-04-01 DIAGNOSIS — Z90722 Acquired absence of ovaries, bilateral: Secondary | ICD-10-CM | POA: Diagnosis not present

## 2024-04-01 NOTE — Progress Notes (Signed)
  [facility] OFFICE PROGRESS NOTE   Diagnosis: Breast cancer  INTERVAL HISTORY:   Madeline Chandler returns as scheduled.  She feels well.  No complaint.  No change over either chest wall other than she feels the implants may be smaller.  Objective:  Vital signs in last 24 hours:  Blood pressure (!) 140/71, pulse 81, temperature 97.9 F (36.6 C), temperature source Temporal, resp. rate 18, height 5' 5 (1.651 m), weight 193 lb 14.4 oz (88 kg), SpO2 100%.    Lymphatics: No cervical, supraclavicular, or axillary nodes Resp: Lungs clear bilaterally Cardio: Regular rate and rhythm GI: No hepatosplenomegaly Vascular: No leg edema  Lab Results:  Lab Results  Component Value Date   WBC 4.5 03/04/2024   HGB 12.8 03/04/2024   HCT 38.8 03/04/2024   MCV 88.8 03/04/2024   PLT 197.0 03/04/2024   NEUTROABS 6.4 03/09/2019    CMP  Lab Results  Component Value Date   NA 138 03/08/2024   K 3.5 03/08/2024   CL 98 03/08/2024   CO2 32 03/08/2024   GLUCOSE 94 03/08/2024   BUN 17 03/08/2024   CREATININE 0.91 03/08/2024   CALCIUM  10.5 (H) 03/08/2024   PROT 7.0 03/04/2024   ALBUMIN 4.0 03/04/2024   AST 20 03/04/2024   ALT 27 03/04/2024   ALKPHOS 94 03/04/2024   BILITOT 0.8 03/04/2024   GFRNONAA >60 05/31/2022   GFRAA >60 10/28/2019    Medications: I have reviewed the patient's current medications.   Assessment/Plan:  Stage II (T2 N0) sees right-sided breast cancer diagnosed in September 1997, status post a lumpectomy, sentinel lymph node biopsy, and right axillary dissection and radiation followed by adjuvant chemotherapy. She completed 5 years of tamoxifen.   2.  DCIS with a microscopic area of invasive carcinoma noted on biopsy of an area of distortion in the upper inner right breast January 2018 DCIS is ER positive, PR positive, and HER-2 positive (not enough invasive tumor for a breast prognostic profile) Right mastectomy 10/28/2016, 1.4 cm intermediate grade  DCIS, negative resection margins, no residual invasive carcinoma identified, no lymph nodes identified in submitted axillary contents Adjuvant Femara  11/11/2016, discontinued 03/29/2022 3. Hysterectomy and bilateral oophorectomy April 2008   4. Right breast implant and left breast reduction 03/07/2017  5.  Negative genetic testing 2018-[employer] Common Cancer Panel-normal  6.  Left breast DCIS Diagnostic left mammogram 01/07/2022-diffuse suspicious linear branching calcifications throughout the left breast suspicious for DCIS Stereotactic core biopsy of outer extent of left breast calcifications and inner extent of left breast calcifications 01/14/2022-DCIS, solid/apocrine with comedonecrosis and calcifications, grade 2/3, foci suspicious for microinvasion involving the inner and outer aspect biopsies, ER 95%, PR 0%,Ki-67 10% Left mastectomy and immediate reconstruction 03/15/2022-DCIS involving the lateral and medial quadrants and within a lactiferous duct beneath the nipple, high-grade, necrosis present, negative margins, negative for invasive carcinoma       Disposition: Madeline Chandler is in remission from breast cancer.  She would like continue follow-up at the Cancer center.  She will return for office visit in 1 year.  Arley Hof, MD  04/01/2024  12:27 PM

## 2024-04-07 ENCOUNTER — Other Ambulatory Visit: Payer: Self-pay | Admitting: Family Medicine

## 2024-04-07 DIAGNOSIS — E1169 Type 2 diabetes mellitus with other specified complication: Secondary | ICD-10-CM

## 2024-04-14 ENCOUNTER — Other Ambulatory Visit: Payer: Self-pay | Admitting: Family Medicine

## 2024-04-14 DIAGNOSIS — I10 Essential (primary) hypertension: Secondary | ICD-10-CM

## 2024-04-21 ENCOUNTER — Other Ambulatory Visit: Payer: Self-pay | Admitting: Family Medicine

## 2024-04-29 ENCOUNTER — Other Ambulatory Visit: Payer: Self-pay | Admitting: Family Medicine

## 2024-05-16 ENCOUNTER — Other Ambulatory Visit: Payer: Self-pay | Admitting: Family Medicine

## 2024-05-16 DIAGNOSIS — E669 Obesity, unspecified: Secondary | ICD-10-CM

## 2024-05-16 DIAGNOSIS — F411 Generalized anxiety disorder: Secondary | ICD-10-CM

## 2024-05-17 ENCOUNTER — Other Ambulatory Visit: Payer: Self-pay | Admitting: Family Medicine

## 2024-05-17 DIAGNOSIS — F411 Generalized anxiety disorder: Secondary | ICD-10-CM

## 2024-06-26 ENCOUNTER — Other Ambulatory Visit: Payer: Self-pay | Admitting: Family Medicine

## 2024-06-30 ENCOUNTER — Other Ambulatory Visit: Payer: Self-pay | Admitting: Family Medicine

## 2024-07-07 ENCOUNTER — Other Ambulatory Visit: Payer: Self-pay | Admitting: Family Medicine

## 2024-07-07 DIAGNOSIS — E119 Type 2 diabetes mellitus without complications: Secondary | ICD-10-CM

## 2024-07-23 ENCOUNTER — Other Ambulatory Visit: Payer: Self-pay | Admitting: Family Medicine

## 2024-07-23 DIAGNOSIS — I10 Essential (primary) hypertension: Secondary | ICD-10-CM

## 2024-07-25 ENCOUNTER — Other Ambulatory Visit: Payer: Self-pay | Admitting: Family Medicine

## 2024-07-25 DIAGNOSIS — I10 Essential (primary) hypertension: Secondary | ICD-10-CM

## 2024-08-05 ENCOUNTER — Other Ambulatory Visit: Payer: Self-pay | Admitting: Family Medicine

## 2024-08-05 DIAGNOSIS — E669 Obesity, unspecified: Secondary | ICD-10-CM

## 2024-09-02 ENCOUNTER — Other Ambulatory Visit: Payer: Self-pay | Admitting: Family Medicine

## 2024-09-02 DIAGNOSIS — E669 Obesity, unspecified: Secondary | ICD-10-CM

## 2024-09-02 DIAGNOSIS — I10 Essential (primary) hypertension: Secondary | ICD-10-CM

## 2024-09-02 DIAGNOSIS — E119 Type 2 diabetes mellitus without complications: Secondary | ICD-10-CM

## 2024-09-03 ENCOUNTER — Telehealth: Payer: Self-pay

## 2024-09-03 NOTE — Telephone Encounter (Signed)
 Copied from CRM #[id number]. Topic: Clinical - Medical Advice >> Sep 03, 2024  8:52 AM Burnard DEL wrote: Reason for CRM: Patient would like to know if provider thinks that it would be safe for her to get the covid vaccine?  Please advise.

## 2024-09-03 NOTE — Telephone Encounter (Signed)
 Sent pt message letting her know about covid vaccine

## 2024-09-04 ENCOUNTER — Other Ambulatory Visit: Payer: Self-pay | Admitting: Family Medicine

## 2024-09-04 DIAGNOSIS — E669 Obesity, unspecified: Secondary | ICD-10-CM

## 2024-09-04 DIAGNOSIS — I10 Essential (primary) hypertension: Secondary | ICD-10-CM

## 2024-09-06 ENCOUNTER — Other Ambulatory Visit: Payer: Self-pay | Admitting: Family Medicine

## 2024-09-06 DIAGNOSIS — I10 Essential (primary) hypertension: Secondary | ICD-10-CM

## 2025-04-01 ENCOUNTER — Ambulatory Visit: Admitting: Oncology
